# Patient Record
Sex: Male | Born: 1950
Health system: Southern US, Community
[De-identification: ages and names within clinical notes are randomized; demographics above are authoritative.]

## PROBLEM LIST (undated history)

## (undated) DIAGNOSIS — I509 Heart failure, unspecified: Secondary | ICD-10-CM

## (undated) DIAGNOSIS — M1990 Unspecified osteoarthritis, unspecified site: Secondary | ICD-10-CM

## (undated) DIAGNOSIS — I10 Essential (primary) hypertension: Secondary | ICD-10-CM

## (undated) DIAGNOSIS — E119 Type 2 diabetes mellitus without complications: Secondary | ICD-10-CM

## (undated) DIAGNOSIS — F329 Major depressive disorder, single episode, unspecified: Secondary | ICD-10-CM

## (undated) DIAGNOSIS — H409 Unspecified glaucoma: Secondary | ICD-10-CM

## (undated) DIAGNOSIS — G4730 Sleep apnea, unspecified: Secondary | ICD-10-CM

## (undated) DIAGNOSIS — F419 Anxiety disorder, unspecified: Secondary | ICD-10-CM

## (undated) DIAGNOSIS — K219 Gastro-esophageal reflux disease without esophagitis: Secondary | ICD-10-CM

## (undated) DIAGNOSIS — F32A Depression, unspecified: Secondary | ICD-10-CM

## (undated) HISTORY — PX: ROTATOR CUFF REPAIR: SHX139

## (undated) HISTORY — PX: KNEE ARTHROPLASTY: SHX992

## (undated) HISTORY — PX: VASECTOMY: SHX75

## (undated) HISTORY — PX: ROTATOR CUFF REPAIR W/ DISTAL CLAVICLE EXCISION: SHX2365

## (undated) HISTORY — PX: KNEE ARTHROSCOPY: SUR90

## (undated) HISTORY — PX: MULTIPLE TOOTH EXTRACTIONS: SHX2053

## (undated) HISTORY — PX: COLONOSCOPY: SHX174

## (undated) HISTORY — DX: Heart failure, unspecified: I50.9

## (undated) HISTORY — PX: CARPAL TUNNEL RELEASE: SHX101

## (undated) HISTORY — PX: EYE SURGERY: SHX253

---
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
## 1998-08-26 ENCOUNTER — Encounter: Payer: Self-pay | Admitting: Family Medicine

## 1998-08-26 ENCOUNTER — Ambulatory Visit (HOSPITAL_COMMUNITY): Admission: RE | Admit: 1998-08-26 | Discharge: 1998-08-26 | Payer: Self-pay | Admitting: Family Medicine

## 2006-11-19 ENCOUNTER — Ambulatory Visit (HOSPITAL_BASED_OUTPATIENT_CLINIC_OR_DEPARTMENT_OTHER): Admission: RE | Admit: 2006-11-19 | Discharge: 2006-11-19 | Payer: Self-pay | Admitting: Orthopedic Surgery

## 2007-01-06 ENCOUNTER — Ambulatory Visit (HOSPITAL_BASED_OUTPATIENT_CLINIC_OR_DEPARTMENT_OTHER): Admission: RE | Admit: 2007-01-06 | Discharge: 2007-01-06 | Payer: Self-pay | Admitting: Orthopedic Surgery

## 2010-11-14 NOTE — Op Note (Signed)
NAME:  Erik Black, Erik Black          ACCOUNT NO.:  [id number]   MEDICAL RECORD NO.:  [id number]          PATIENT TYPE:  AMB   LOCATION:  DSC                          FACILITY:  [facility]   PHYSICIAN:  Robert A. Thurston Hole, M.D. DATE OF BIRTH:  [birth year]   DATE OF PROCEDURE:  01/06/2007  DATE OF DISCHARGE:                               OPERATIVE REPORT   PREOPERATIVE DIAGNOSIS:  Left knee medial and lateral meniscal tears  with chondromalacia/degenerative joint disease and synovitis.   POSTOPERATIVE DIAGNOSIS:  Left knee medial and lateral meniscal tears  with chondromalacia/degenerative joint disease and synovitis.   PROCEDURE:  1. Left knee EUA followed by arthroscopic partial medial and lateral      meniscectomy.  2. Left knee chondroplasty with partial synovectomy.   SURGEON:  Elana Alm. Thurston Hole, M.D.   ASSISTANT:  Julien Girt, P.A.   ANESTHESIA:  General.   OPERATIVE TIME:  30 minutes.   COMPLICATIONS:  None.   INDICATIONS FOR PROCEDURE:  Erik Black is a 60 year old gentleman who  has had 6-8 months of increasing left knee pain with exam and x-rays  documenting meniscal tearing with chondromalacia and synovitis.  He has  failed conservative care and is now to undergo arthroscopy.   DESCRIPTION OF PROCEDURE:  Erik Black was brought to the operating  room on January 06, 2007, placed on the operating table in supine position.  After an adequate level of general anesthesia was obtained, his left  knee was examined.  Range of motion from 0 to 125 degrees, 1-2+  crepitation, stable ligamentous exam with normal patellar tracking.  The  knee was sterilely injected with 0.25% Marcaine with epinephrine.  He  had two small lesions on his thigh that might have been slightly pustule  although no definite infection, but because of this and these were  nowhere near the knee itself but they were treated with prophylactic  antibiotics and I gave him vancomycin 1.5 grams IV preoperatively.   His  left leg was then prepped using sterile DuraPrep and draped using  sterile technique.  Originally through an anterolateral portal, the  arthroscope with a pump attached was placed into an anteromedial portal  and arthroscopic probe was placed.  On initial inspection of the medial  compartment, he was found to 75% grade 3 chondromalacia,  medial femoral  condyle, medial tibial plateau which was debrided.  The medial meniscus  showed tearing of the posteromedial horn of which 50% was resected back  to a stable rim.  The intercondylar notch inspected, anterior and  posterior cruciate ligaments were normal.  The lateral compartment  inspected, 40% grade 3 chondromalacia in the lateral tibial plateau  which was debrided.  Only grade 1 and 2 changes of the lateral femoral  condyle. The lateral meniscus showed tearing of the posterolateral  corner, 25-30% was resected back to stable rim.  The patellofemoral  joint showed 75% grade 3 chondromalacia on the patellofemoral groove and  this was debrided.  The patella tracked normally. Significant synovitis  and medial and lateral gutters were debrided, otherwise they are free of  pathology.  After this was done, it  was felt that all pathology had been  satisfactorily addressed.  The instruments were removed. The portals  were closed with 3-0 nylon suture and injected with 0.25% Marcaine with  epinephrine and 4 mg morphine.  Sterile dressings applied and the  patient awakened and taken to the recovery room in stable condition.   FOLLOW-UP CARE:  Mr. Altier will be followed as an outpatient on  Percocet for pain.  See him back in the office in a week for sutures out  and followup.      Robert A. Thurston Hole, M.D.  Electronically Signed     RAW/MEDQ  D:  01/06/2007  T:  01/06/2007  Job:  [id number]

## 2010-11-17 NOTE — Op Note (Signed)
NAME:  Erik Black, Erik Black          ACCOUNT NO.:  [id number]   MEDICAL RECORD NO.:  [id number]          PATIENT TYPE:  AMB   LOCATION:  DSC                          FACILITY:  [facility]   PHYSICIAN:  Robert A. Thurston Hole, M.D. DATE OF BIRTH:  02-25-1951   DATE OF PROCEDURE:  11/19/2006  DATE OF DISCHARGE:                               OPERATIVE REPORT   PREOPERATIVE DIAGNOSIS:  Right knee medial and lateral meniscus tears  with chondromalacia/degenerative joint disease and synovitis.   POSTOPERATIVE DIAGNOSIS:  Right knee medial and lateral meniscus tears  with chondromalacia/degenerative joint disease and synovitis.   PROCEDURE:  1. Right knee examination under anesthesia followed by arthroscopic      partial medial and lateral meniscectomies.  2. Right knee chondroplasty with partial synovectomy.   SURGEON:  Elana Alm. Thurston Hole, M.D.   ANESTHESIA:  General.   OPERATIVE TIME:  30 minutes.   COMPLICATIONS:  None.   INDICATIONS FOR PROCEDURE:  The patient is a 60 year old gentleman who  has had significant recurrent right knee pain with exam and MRI  documenting a meniscal tear with chondromalacia and synovitis.  He has  failed conservative care and is now to undergo arthroscopy.   PROCEDURE:  The patient was brought to the operating room on 11/19/2006  after a knee block was placed in the holding area by Anesthesia.  He was  placed on the operative table in supine position.  His right knee was  examined under anesthesia.  He has full range of motion and 2+  crepitation.  The knee remained stable to ligamentous exam with normal  patella tracking.   The right leg was prepped using sterile DuraPrep and draped using  sterile technique.  He received Ancef 1 gram IV preoperatively for  prophylaxis.  Initially through an anterolateral portal the arthroscope  with a pump attached was placed, and through an anteromedial portal an  arthroscopic probe was placed.  On initial inspection in the  medial  compartment he had 80 to 90% grade 3 chondromalacia in the medial  femoral condyle and medial tibial plateau, and this was debrided.  Medial meniscus tear posterior and medial horn of which 40 to 50% was  resected back to a stable rim.  The intercondylar notch and the anterior  and posterior cruciate ligaments were intact and stable.  There was a  large tibial spine spur which was removed with a 6 mm bur.  It was  impinging on extension and was resected completely.  The lateral  compartment showed only mild Grade 1 and 2 chondromalacia.  The lateral  meniscus showed a small tear of 15% posterior and lateral corner which  was resected back to a stable rim.  The patellofemoral joint showed 80  to 90% Grade 3 chondromalacia on the patella and femoral groove, and  this was debrided.  The patella tracked normally.  Significant synovitis  in the medial and lateral gutters was debrided.  Otherwise, they were  free of pathology.  After this was done, it was felt that all pathology  had been satisfactorily addressed.  The instruments were removed.   The portals were  closed with 3-0 nylon suture.  Sterile dressings were  applied after the knee was injected with  0.25% Marcaine with  epinephrine and 4 mg morphine.  A sterile dressing was applied.  The  patient was awakened and taken to Recovery in stable condition.   FOLLOW UP CARE:  The patient will be followed either as an outpatient or  overnight recovery care depending on his postoperative awakening status  since he is a sleep apnea patient and is well controlled on a CPAP  machine.  We will make the decision over the next hour with the help of  Anesthesia.  He will be discharged on Norco 10 and Mobic.  We will see  him back in the office in 1 week for sutures out and follow up.      Robert A. Thurston Hole, M.D.  Electronically Signed     RAW/MEDQ  D:  11/19/2006  T:  11/20/2006  Job:  [id number]

## 2011-04-17 LAB — BASIC METABOLIC PANEL
BUN: 13
CO2: 31
Calcium: 9.1
Chloride: 99
Creatinine, Ser: 0.88
GFR calc Af Amer: >60
GFR calc non Af Amer: >60
Glucose, Bld: 83
Potassium: 4
Sodium: 135

## 2011-04-17 LAB — POCT HEMOGLOBIN-HEMACUE
Hemoglobin: 13
Operator id: 12362

## 2018-01-24 ENCOUNTER — Ambulatory Visit (INDEPENDENT_AMBULATORY_CARE_PROVIDER_SITE_OTHER): Payer: PRIVATE HEALTH INSURANCE

## 2018-01-24 ENCOUNTER — Ambulatory Visit (INDEPENDENT_AMBULATORY_CARE_PROVIDER_SITE_OTHER): Payer: PRIVATE HEALTH INSURANCE | Admitting: Orthopaedic Surgery

## 2018-01-24 VITALS — Ht 72.0 in | Wt 321.0 lb

## 2018-01-24 DIAGNOSIS — Z6841 Body Mass Index (BMI) 40.0 and over, adult: Secondary | ICD-10-CM

## 2018-01-24 DIAGNOSIS — M25551 Pain in right hip: Secondary | ICD-10-CM | POA: Diagnosis not present

## 2018-01-24 DIAGNOSIS — M1612 Unilateral primary osteoarthritis, left hip: Secondary | ICD-10-CM | POA: Diagnosis not present

## 2018-01-24 DIAGNOSIS — M1611 Unilateral primary osteoarthritis, right hip: Secondary | ICD-10-CM | POA: Diagnosis not present

## 2018-01-24 NOTE — Progress Notes (Signed)
Office Visit Note   Patient: Erik Black           Date of Birth: 07/05/1950           MRN: [id number] Visit Date: 01/24/2018              Requested by: Aviva Kluver, PA-C [facility] [address] [place], [place] [address] PCP: Patient, No Pcp Per   Assessment & Plan: Visit Diagnoses:  1. Primary osteoarthritis of right hip   2. Pain in right hip   3. Primary osteoarthritis of left hip   4. Morbid obesity (HCC)   5. Body mass index 40.0-44.9, adult (HCC)     Plan: Impression is advanced right hip degenerative joint disease.  Patient has borderline control his diabetes with his most recent A1c of 8.0.  He also still smokes cigarettes.  His BMI is 43.5 today.  At this point patient is not a surgical candidate given his multiple risk factors.  We will help the patient with referrals to Dr. Dalbert Garnet for weight counseling and pain management clinic.  Questions encouraged and answered.  Follow-up as needed.  They have my card if they need to get in touch with me. Total face to face encounter time was greater than 45 minutes and over half of this time was spent in counseling and/or coordination of care.  Follow-Up Instructions: Return if symptoms worsen or fail to improve.   Orders:  Orders Placed This Encounter  Procedures  . XR HIP UNILAT W OR W/O PELVIS 1V RIGHT   No orders of the defined types were placed in this encounter.     Procedures: No procedures performed   Clinical Data: No additional findings.   Subjective: Chief Complaint  Patient presents with  . Right Hip - Pain    Patient is a 67 year old gentleman who comes in with constant severe right hip pain that is severely debilitating and limiting his ADLs.  He denies any numbness and tingling.  He walks with a limp.  He comes in as a referral from the [place].  He has had a cortisone injection with 3 days of relief.  Denies any numbness and tingling.   Review of Systems  Constitutional: Negative.   All other  systems reviewed and are negative.    Objective: Vital Signs: Ht 6' (1.829 m)   Wt (!) 321 lb (145.6 kg)   BMI 43.54 kg/m   Physical Exam  Constitutional: He is oriented to person, place, and time. He appears well-developed and well-nourished.  HENT:  Head: Normocephalic and atraumatic.  Eyes: Pupils are equal, round, and reactive to light.  Neck: Neck supple.  Pulmonary/Chest: Effort normal.  Abdominal: Soft.  Musculoskeletal: Normal range of motion.  Neurological: He is alert and oriented to person, place, and time.  Skin: Skin is warm.  Psychiatric: He has a normal mood and affect. His behavior is normal. Judgment and thought content normal.  Nursing note and vitals reviewed.   Ortho Exam Right hip exam shows limited range of motion.  Positive Stinchfield sign and positive FADIR. Specialty Comments:  No specialty comments available.  Imaging: Xr Hip Unilat W Or W/o Pelvis 1v Right  Result Date: 01/24/2018 Advanced hip joint degenerative disease    PMFS History: There are no active problems to display for this patient.  No past medical history on file.  No family history on file.   Social History   Occupational History  . Not on file  Tobacco Use  .  Smoking status: Not on file  Substance and Sexual Activity  . Alcohol use: Not on file  . Drug use: Not on file  . Sexual activity: Not on file

## 2018-03-20 ENCOUNTER — Telehealth (INDEPENDENT_AMBULATORY_CARE_PROVIDER_SITE_OTHER): Payer: Self-pay | Admitting: *Deleted

## 2018-03-20 NOTE — Telephone Encounter (Signed)
Pt called stating he would like to have copy of his hip on CD and he will come to pick it up. Also, pt is advising me that he is changing doctors d/t lack of communication. Pt says that Dr.Xu will not do surgery until he loses 50lbs and that he is going to refer pt to Dr. Dalbert Garnet for weight counseling to help with weight loss. [facility] did not need to lose 50lbs before surgery. Pt was not happy with our services. Pt also states that his A1C is now at a 7 and has lost 2lbs since last seen.   I sent message to Timmothy Euler and Lequita Halt advising needing a copy of CD. They will call pt once it has been copied.

## 2018-03-21 NOTE — Telephone Encounter (Signed)
Patient called to state to disregard previous message. He does NOT need his CD. He will be coming back to the practice because the [place] advised him he has to because practice was authorized for the services. He stated he will be trying to loose his weight and wanted to apologize for his previous conversation.

## 2018-03-24 NOTE — Telephone Encounter (Signed)
noted 

## 2018-04-21 ENCOUNTER — Telehealth (INDEPENDENT_AMBULATORY_CARE_PROVIDER_SITE_OTHER): Payer: Self-pay | Admitting: Orthopaedic Surgery

## 2018-04-21 NOTE — Telephone Encounter (Signed)
Patient is requesting a call back from Dr. Roda Shutters to discuss options that he has, he was told he would need to lose 50 pounds before hip surgery but said the weight is not coming off. He would like to discuss next steps for him. CB # [phone]

## 2018-04-21 NOTE — Telephone Encounter (Signed)
We can refer him to general surgery for bariatric surgery if he agrees.

## 2018-04-21 NOTE — Telephone Encounter (Signed)
Please advise 

## 2018-04-22 NOTE — Telephone Encounter (Signed)
I spoke with patient and he agrees. Form for [facility] Request for Additional Services faxed to [phone] for [facility] review.

## 2018-05-05 ENCOUNTER — Telehealth (INDEPENDENT_AMBULATORY_CARE_PROVIDER_SITE_OTHER): Payer: Self-pay | Admitting: Surgery

## 2018-05-05 NOTE — Telephone Encounter (Signed)
Patient left a message requesting to speak to Zonia Kief nurse or assistant.  CB#[phone]

## 2018-05-05 NOTE — Telephone Encounter (Signed)
This is a [facility] patient, I called him to see what is going on he states that he was given 2 options before he could have hip replacement surgery.  He states that he would like to be referred to someone for Gastric Surgery to help him lose the weight.  He states that he wants to lose the 50lbs like last week.  He is complaining of severe pain and hes wanting to get rid of it.  And [facility] is [facility].

## 2018-05-05 NOTE — Telephone Encounter (Signed)
[facility] please.  Thanks.

## 2018-05-05 NOTE — Telephone Encounter (Signed)
Please advise where patient should be referred to. Thanks.

## 2018-05-06 ENCOUNTER — Other Ambulatory Visit (INDEPENDENT_AMBULATORY_CARE_PROVIDER_SITE_OTHER): Payer: Self-pay

## 2018-05-06 DIAGNOSIS — M25551 Pain in right hip: Secondary | ICD-10-CM

## 2018-05-06 NOTE — Telephone Encounter (Signed)
Ambulatory referral for [facility] for bariatric consult in chart.

## 2018-09-03 DIAGNOSIS — F17200 Nicotine dependence, unspecified, uncomplicated: Secondary | ICD-10-CM | POA: Diagnosis not present

## 2018-09-03 DIAGNOSIS — E7800 Pure hypercholesterolemia, unspecified: Secondary | ICD-10-CM | POA: Diagnosis not present

## 2018-09-03 DIAGNOSIS — F329 Major depressive disorder, single episode, unspecified: Secondary | ICD-10-CM | POA: Diagnosis not present

## 2018-09-03 DIAGNOSIS — Z87891 Personal history of nicotine dependence: Secondary | ICD-10-CM | POA: Diagnosis not present

## 2018-09-03 DIAGNOSIS — R0602 Shortness of breath: Secondary | ICD-10-CM | POA: Diagnosis not present

## 2018-09-03 DIAGNOSIS — R635 Abnormal weight gain: Secondary | ICD-10-CM | POA: Diagnosis not present

## 2018-09-10 DIAGNOSIS — F331 Major depressive disorder, recurrent, moderate: Secondary | ICD-10-CM | POA: Diagnosis not present

## 2018-09-15 DIAGNOSIS — G4733 Obstructive sleep apnea (adult) (pediatric): Secondary | ICD-10-CM | POA: Diagnosis not present

## 2018-09-15 DIAGNOSIS — Z9989 Dependence on other enabling machines and devices: Secondary | ICD-10-CM | POA: Diagnosis not present

## 2018-09-15 DIAGNOSIS — R0602 Shortness of breath: Secondary | ICD-10-CM | POA: Diagnosis not present

## 2018-09-15 DIAGNOSIS — F17200 Nicotine dependence, unspecified, uncomplicated: Secondary | ICD-10-CM | POA: Diagnosis not present

## 2018-09-15 DIAGNOSIS — Z77090 Contact with and (suspected) exposure to asbestos: Secondary | ICD-10-CM | POA: Diagnosis not present

## 2018-09-15 DIAGNOSIS — R942 Abnormal results of pulmonary function studies: Secondary | ICD-10-CM | POA: Diagnosis not present

## 2018-09-19 DIAGNOSIS — F329 Major depressive disorder, single episode, unspecified: Secondary | ICD-10-CM | POA: Diagnosis not present

## 2018-10-30 DIAGNOSIS — R635 Abnormal weight gain: Secondary | ICD-10-CM | POA: Diagnosis not present

## 2018-10-30 DIAGNOSIS — Z6841 Body Mass Index (BMI) 40.0 and over, adult: Secondary | ICD-10-CM | POA: Diagnosis not present

## 2018-11-28 DIAGNOSIS — F17210 Nicotine dependence, cigarettes, uncomplicated: Secondary | ICD-10-CM | POA: Diagnosis not present

## 2018-11-28 DIAGNOSIS — F5081 Binge eating disorder: Secondary | ICD-10-CM | POA: Diagnosis not present

## 2018-11-28 DIAGNOSIS — Z6841 Body Mass Index (BMI) 40.0 and over, adult: Secondary | ICD-10-CM | POA: Diagnosis not present

## 2018-11-28 DIAGNOSIS — Z87891 Personal history of nicotine dependence: Secondary | ICD-10-CM | POA: Diagnosis not present

## 2018-11-28 DIAGNOSIS — R635 Abnormal weight gain: Secondary | ICD-10-CM | POA: Diagnosis not present

## 2018-12-29 DIAGNOSIS — Z87891 Personal history of nicotine dependence: Secondary | ICD-10-CM | POA: Diagnosis not present

## 2018-12-29 DIAGNOSIS — Z6841 Body Mass Index (BMI) 40.0 and over, adult: Secondary | ICD-10-CM | POA: Diagnosis not present

## 2018-12-29 DIAGNOSIS — F17210 Nicotine dependence, cigarettes, uncomplicated: Secondary | ICD-10-CM | POA: Diagnosis not present

## 2018-12-29 DIAGNOSIS — R635 Abnormal weight gain: Secondary | ICD-10-CM | POA: Diagnosis not present

## 2019-01-28 DIAGNOSIS — R635 Abnormal weight gain: Secondary | ICD-10-CM | POA: Diagnosis not present

## 2019-01-28 DIAGNOSIS — Z87891 Personal history of nicotine dependence: Secondary | ICD-10-CM | POA: Diagnosis not present

## 2019-01-28 DIAGNOSIS — F17210 Nicotine dependence, cigarettes, uncomplicated: Secondary | ICD-10-CM | POA: Diagnosis not present

## 2019-01-28 DIAGNOSIS — E538 Deficiency of other specified B group vitamins: Secondary | ICD-10-CM | POA: Diagnosis not present

## 2019-01-28 DIAGNOSIS — Z1159 Encounter for screening for other viral diseases: Secondary | ICD-10-CM | POA: Diagnosis not present

## 2019-01-28 DIAGNOSIS — E119 Type 2 diabetes mellitus without complications: Secondary | ICD-10-CM | POA: Diagnosis not present

## 2019-02-26 DIAGNOSIS — Z6841 Body Mass Index (BMI) 40.0 and over, adult: Secondary | ICD-10-CM | POA: Diagnosis not present

## 2019-02-26 DIAGNOSIS — R635 Abnormal weight gain: Secondary | ICD-10-CM | POA: Diagnosis not present

## 2019-02-26 DIAGNOSIS — E119 Type 2 diabetes mellitus without complications: Secondary | ICD-10-CM | POA: Diagnosis not present

## 2019-02-26 DIAGNOSIS — D696 Thrombocytopenia, unspecified: Secondary | ICD-10-CM | POA: Diagnosis not present

## 2019-03-03 DIAGNOSIS — Z79899 Other long term (current) drug therapy: Secondary | ICD-10-CM | POA: Diagnosis not present

## 2019-03-03 DIAGNOSIS — M25569 Pain in unspecified knee: Secondary | ICD-10-CM | POA: Diagnosis not present

## 2019-03-03 DIAGNOSIS — G894 Chronic pain syndrome: Secondary | ICD-10-CM | POA: Diagnosis not present

## 2019-03-03 DIAGNOSIS — Z760 Encounter for issue of repeat prescription: Secondary | ICD-10-CM | POA: Diagnosis not present

## 2019-04-02 DIAGNOSIS — M159 Polyosteoarthritis, unspecified: Secondary | ICD-10-CM | POA: Diagnosis not present

## 2019-04-02 DIAGNOSIS — Z79899 Other long term (current) drug therapy: Secondary | ICD-10-CM | POA: Diagnosis not present

## 2019-04-02 DIAGNOSIS — G894 Chronic pain syndrome: Secondary | ICD-10-CM | POA: Diagnosis not present

## 2019-04-02 DIAGNOSIS — M25551 Pain in right hip: Secondary | ICD-10-CM | POA: Diagnosis not present

## 2019-04-02 DIAGNOSIS — M25552 Pain in left hip: Secondary | ICD-10-CM | POA: Diagnosis not present

## 2019-04-06 DIAGNOSIS — D696 Thrombocytopenia, unspecified: Secondary | ICD-10-CM | POA: Diagnosis not present

## 2019-04-06 DIAGNOSIS — Z1159 Encounter for screening for other viral diseases: Secondary | ICD-10-CM | POA: Diagnosis not present

## 2019-04-06 DIAGNOSIS — E119 Type 2 diabetes mellitus without complications: Secondary | ICD-10-CM | POA: Diagnosis not present

## 2019-04-06 DIAGNOSIS — R635 Abnormal weight gain: Secondary | ICD-10-CM | POA: Diagnosis not present

## 2019-04-16 ENCOUNTER — Encounter: Payer: Self-pay | Admitting: Orthopaedic Surgery

## 2019-04-16 ENCOUNTER — Ambulatory Visit (INDEPENDENT_AMBULATORY_CARE_PROVIDER_SITE_OTHER): Payer: Self-pay | Admitting: Orthopaedic Surgery

## 2019-04-16 ENCOUNTER — Other Ambulatory Visit: Payer: Self-pay

## 2019-04-16 VITALS — Ht 72.0 in | Wt 294.6 lb

## 2019-04-16 DIAGNOSIS — M1611 Unilateral primary osteoarthritis, right hip: Secondary | ICD-10-CM | POA: Insufficient documentation

## 2019-04-16 NOTE — Progress Notes (Signed)
Office Visit Note   Patient: Erik Black           Date of Birth: 1951/02/25           MRN: [id number] Visit Date: 04/16/2019              Requested by: No referring provider defined for this encounter. PCP: Patient, No Pcp Per   Assessment & Plan: Visit Diagnoses:  1. Primary osteoarthritis of right hip     Plan: Impression is end-stage right hip DJD.  We will obtain A1c level today to make sure this is within acceptable limits.  BMI is just under 40.  Details of surgery including risks benefits and rehab were discussed.  Questions encouraged and answered.  We will notify the patient of the A1c results and schedule accordingly. Total face to face encounter time was greater than 25 minutes and over half of this time was spent in counseling and/or coordination of care.  Follow-Up Instructions: Return if symptoms worsen or fail to improve.   Orders:  Orders Placed This Encounter  Procedures  . HgB A1c   No orders of the defined types were placed in this encounter.     Procedures: No procedures performed   Clinical Data: No additional findings.   Subjective: Chief Complaint  Patient presents with  . Right Hip - Pain    [name] is a 68 year old gentleman who returns today for follow-up of right hip pain secondary to his severe DJD.  We last saw him last year and recommended weight loss prior to undergoing a total hip replacement.  In the meantime he has lost 50 pounds and he states that his diabetes is under much better control.  He is hoping to be able to schedule a total hip replacement at this point.  He continues to have severe pain in his right hip that is limiting his ADLs.  He has chronic pain.   Review of Systems  Constitutional: Negative.   All other systems reviewed and are negative.    Objective: Vital Signs: Ht 6' (1.829 m)   Wt 294 lb 9.6 oz (133.6 kg)   BMI 39.95 kg/m   Physical Exam Vitals signs and nursing note reviewed.   Constitutional:      Appearance: He is well-developed.  Pulmonary:     Effort: Pulmonary effort is normal.  Abdominal:     Palpations: Abdomen is soft.  Skin:    General: Skin is warm.  Neurological:     Mental Status: He is alert and oriented to person, place, and time.  Psychiatric:        Behavior: Behavior normal.        Thought Content: Thought content normal.        Judgment: Judgment normal.     Ortho Exam Right hip exam shows significant pain with internal and external rotation.  Unable to perform Stinchfield sign secondary to pain. Specialty Comments:  No specialty comments available.  Imaging: No results found.   PMFS History: Patient Active Problem List   Diagnosis Date Noted  . Primary osteoarthritis of right hip 04/16/2019   History reviewed. No pertinent past medical history.  History reviewed. No pertinent family history.  History reviewed. No pertinent surgical history. Social History   Occupational History  . Not on file  Tobacco Use  . Smoking status: Not on file  Substance and Sexual Activity  . Alcohol use: Not on file  . Drug use: Not on file  .  Sexual activity: Not on file

## 2019-04-17 LAB — HEMOGLOBIN A1C
Hgb A1c MFr Bld: 6.1 % of total Hgb — ABNORMAL HIGH (ref <5.7)
Mean Plasma Glucose: 128 (calc)
eAG (mmol/L): 7.1 (calc)

## 2019-04-17 LAB — EXTRA SPECIMEN

## 2019-04-17 NOTE — Progress Notes (Signed)
Ok to schedule.

## 2019-04-21 ENCOUNTER — Telehealth: Payer: Self-pay

## 2019-04-21 NOTE — Telephone Encounter (Signed)
[place] would like last office note faxed to [phone], attn.medical records.  Please advise.  Thank you.

## 2019-04-22 ENCOUNTER — Telehealth: Payer: Self-pay | Admitting: Orthopaedic Surgery

## 2019-04-22 NOTE — Telephone Encounter (Signed)
Tried calling patient no answer. LMOM. A1c 6.1

## 2019-04-22 NOTE — Telephone Encounter (Signed)
Ok please call him with lab results.  Thanks.

## 2019-04-22 NOTE — Telephone Encounter (Signed)
Patient called. He would like to know his lab results. His call back number is [phone]

## 2019-04-22 NOTE — Telephone Encounter (Signed)
Please advise 

## 2019-04-23 ENCOUNTER — Telehealth: Payer: Self-pay | Admitting: Orthopaedic Surgery

## 2019-04-23 NOTE — Telephone Encounter (Signed)
Spoke with patient read note from [name] concerning his A1c results

## 2019-04-23 NOTE — Telephone Encounter (Signed)
Mrs Joycelyn Schmid advised on message below.

## 2019-05-01 DIAGNOSIS — Z79899 Other long term (current) drug therapy: Secondary | ICD-10-CM | POA: Diagnosis not present

## 2019-05-01 DIAGNOSIS — M159 Polyosteoarthritis, unspecified: Secondary | ICD-10-CM | POA: Diagnosis not present

## 2019-05-01 DIAGNOSIS — G894 Chronic pain syndrome: Secondary | ICD-10-CM | POA: Diagnosis not present

## 2019-05-05 DIAGNOSIS — E119 Type 2 diabetes mellitus without complications: Secondary | ICD-10-CM | POA: Diagnosis not present

## 2019-05-05 DIAGNOSIS — R635 Abnormal weight gain: Secondary | ICD-10-CM | POA: Diagnosis not present

## 2019-05-05 DIAGNOSIS — E1122 Type 2 diabetes mellitus with diabetic chronic kidney disease: Secondary | ICD-10-CM | POA: Diagnosis not present

## 2019-05-06 NOTE — Progress Notes (Signed)
[id number] - [place], [place] - [address] [place] [address] Phone: [phone] Fax: [phone]      Your procedure is scheduled on Monday, Nov. 9th.  Report to [facility] Main Entrance "A" at 8:20 A.M., and check in at the Admitting office.  Call this number if you have problems the morning of surgery:  [phone]  Call [phone] if you have any questions prior to your surgery date Monday-Friday 8am-4pm    Remember:  Do not eat after midnight the night before your surgery  You may drink clear liquids until 7:20 AM the morning of your surgery.   Clear liquids allowed are: Water, Non-Citrus Juices (without pulp), Carbonated Beverages, Clear Tea, Black Coffee Only, and Gatorade   Enhanced Recovery after Surgery for Orthopedics Enhanced Recovery after Surgery is a protocol used to improve the stress on your body and your recovery after surgery.  Patient Instructions  . The night before surgery:  o No food after midnight. ONLY clear liquids after midnight  . The day of surgery (if you have diabetes): o  o Drink ONE (1) Gatorade 2 (G2) as directed. o This drink was given to you during your hospital  pre-op appointment visit.  o The pre-op nurse will instruct you on the time to drink the   Gatorade 2 (G2) depending on your surgery time. o Color of the Gatorade may vary. Red is not allowed. o Nothing else to drink after completing the  Gatorade 2 (G2).         If you have questions, please contact your surgeon's office.     Take these medicines the morning of surgery with A SIP OF WATER   Amlodipine (Norvasc)  Duloxetine (Cymbalta)  Flonase nasal spray - if needed  Eye drops - if needed  Metoprolol (Lopressor)  Omeprazole (Prilosec)  Oxycodone - if needed   2 weeks prior to surgery, or as soon as possible, STOP taking Phentermine.   7 days prior to surgery STOP taking any Aspirin (unless otherwise instructed by your surgeon),  Aleve, Naproxen, Ibuprofen, Motrin, Advil, Goody's, BC's, all herbal medications, fish oil, and all vitamins.   WHAT DO I DO ABOUT MY DIABETES MEDICATION?   Marland Kitchen Do not take oral diabetes medicines (pills) the morning of surgery.   . The day of surgery, do not take other diabetes injectables, including Byetta (exenatide), Bydureon (exenatide ER), Victoza (liraglutide), or Trulicity (dulaglutide).    How to Manage Your Diabetes Before and After Surgery  Why is it important to control my blood sugar before and after surgery? . Improving blood sugar levels before and after surgery helps healing and can limit problems. . A way of improving blood sugar control is eating a healthy diet by: o  Eating less sugar and carbohydrates o  Increasing activity/exercise o  Talking with your doctor about reaching your blood sugar goals . High blood sugars (greater than 180 mg/dL) can raise your risk of infections and slow your recovery, so you will need to focus on controlling your diabetes during the weeks before surgery. . Make sure that the doctor who takes care of your diabetes knows about your planned surgery including the date and location.  How do I manage my blood sugar before surgery? . Check your blood sugar at least 4 times a day, starting 2 days before surgery, to make sure that the level is not too high or low. o Check your blood sugar the morning of  your surgery when you wake up and every 2 hours until you get to the Short Stay unit. . If your blood sugar is less than 70 mg/dL, you will need to treat for low blood sugar: o Do not take insulin. o Treat a low blood sugar (less than 70 mg/dL) with  cup of clear juice (cranberry or apple), 4 glucose tablets, OR glucose gel. o Recheck blood sugar in 15 minutes after treatment (to make sure it is greater than 70 mg/dL). If your blood sugar is not greater than 70 mg/dL on recheck, call [phone] for further instructions. . Report your blood  sugar to the short stay nurse when you get to Short Stay.  . If you are admitted to the hospital after surgery: o Your blood sugar will be checked by the staff and you will probably be given insulin after surgery (instead of oral diabetes medicines) to make sure you have good blood sugar levels. o The goal for blood sugar control after surgery is 80-180 mg/dL.    The Morning of Surgery  Do not wear jewelry.  Do not wear lotions, powders, colognes, or deodorant  Men may shave face and neck.  Do not bring valuables to the hospital.  [facility] is not responsible for any belongings or valuables.  If you are a smoker, DO NOT Smoke 24 hours prior to surgery IF you wear a CPAP at night please bring your mask, tubing, and machine the morning of surgery   Remember that you must have someone to transport you home after your surgery, and remain with you for 24 hours if you are discharged the same day.   Contacts, glasses, hearing aids, dentures or bridgework may not be worn into surgery.    Leave your suitcase in the car.  After surgery it may be brought to your room.  For patients admitted to the hospital, discharge time will be determined by your treatment team.  Patients discharged the day of surgery will not be allowed to drive home.    Special instructions:   [facility]- Preparing For Surgery  Before surgery, you can play an important role. Because skin is not sterile, your skin needs to be as free of germs as possible. You can reduce the number of germs on your skin by washing with CHG (chlorahexidine gluconate) Soap before surgery.  CHG is an antiseptic cleaner which kills germs and bonds with the skin to continue killing germs even after washing.    Oral Hygiene is also important to reduce your risk of infection.  Remember - BRUSH YOUR TEETH THE MORNING OF SURGERY WITH YOUR REGULAR TOOTHPASTE  Please do not use if you have an allergy to CHG or antibacterial soaps. If your skin  becomes reddened/irritated stop using the CHG.  Do not shave (including legs and underarms) for at least 48 hours prior to first CHG shower. It is OK to shave your face.  Please follow these instructions carefully.   1. Shower the NIGHT BEFORE SURGERY and the MORNING OF SURGERY with CHG Soap.   2. If you chose to wash your hair, wash your hair first as usual with your normal shampoo.  3. After you shampoo, rinse your hair and body thoroughly to remove the shampoo.  4. Use CHG as you would any other liquid soap. You can apply CHG directly to the skin and wash gently with a scrungie or a clean washcloth.   5. Apply the CHG Soap to your body ONLY FROM  THE NECK DOWN.  Do not use on open wounds or open sores. Avoid contact with your eyes, ears, mouth and genitals (private parts). Wash Face and genitals (private parts)  with your normal soap.   6. Wash thoroughly, paying special attention to the area where your surgery will be performed.  7. Thoroughly rinse your body with warm water from the neck down.  8. DO NOT shower/wash with your normal soap after using and rinsing off the CHG Soap.  9. Pat yourself dry with a CLEAN TOWEL.  10. Wear CLEAN PAJAMAS to bed the night before surgery, wear comfortable clothes the morning of surgery  11. Place CLEAN SHEETS on your bed the night of your first shower and DO NOT SLEEP WITH PETS.    Day of Surgery:  Do not apply any deodorants/lotions. Please shower the morning of surgery with the CHG soap  Please wear clean clothes to the hospital/surgery center.   Remember to brush your teeth WITH YOUR REGULAR TOOTHPASTE.   Please read over the following fact sheets that you were given.

## 2019-05-07 ENCOUNTER — Encounter (HOSPITAL_COMMUNITY)
Admission: RE | Admit: 2019-05-07 | Discharge: 2019-05-07 | Disposition: A | Discharge status: Home / Self Care | Payer: No Typology Code available for payment source | Source: Ambulatory Visit | Attending: Orthopaedic Surgery | Admitting: Orthopaedic Surgery

## 2019-05-07 ENCOUNTER — Other Ambulatory Visit: Payer: Self-pay

## 2019-05-07 ENCOUNTER — Ambulatory Visit (HOSPITAL_COMMUNITY)
Admission: RE | Admit: 2019-05-07 | Discharge: 2019-05-07 | Disposition: A | Discharge status: Home / Self Care | Payer: No Typology Code available for payment source | Source: Ambulatory Visit | Attending: Physician Assistant | Admitting: Physician Assistant

## 2019-05-07 ENCOUNTER — Encounter (HOSPITAL_COMMUNITY): Payer: Self-pay

## 2019-05-07 ENCOUNTER — Other Ambulatory Visit (HOSPITAL_COMMUNITY)
Admission: RE | Admit: 2019-05-07 | Discharge: 2019-05-07 | Disposition: A | Discharge status: Home / Self Care | Payer: No Typology Code available for payment source | Source: Ambulatory Visit | Attending: Orthopaedic Surgery | Admitting: Orthopaedic Surgery

## 2019-05-07 DIAGNOSIS — Z20828 Contact with and (suspected) exposure to other viral communicable diseases: Secondary | ICD-10-CM | POA: Diagnosis not present

## 2019-05-07 DIAGNOSIS — M1611 Unilateral primary osteoarthritis, right hip: Secondary | ICD-10-CM | POA: Insufficient documentation

## 2019-05-07 HISTORY — DX: Anxiety disorder, unspecified: F41.9

## 2019-05-07 HISTORY — DX: Depression, unspecified: F32.A

## 2019-05-07 HISTORY — DX: Gastro-esophageal reflux disease without esophagitis: K21.9

## 2019-05-07 HISTORY — DX: Unspecified osteoarthritis, unspecified site: M19.90

## 2019-05-07 HISTORY — DX: Sleep apnea, unspecified: G47.30

## 2019-05-07 HISTORY — DX: Essential (primary) hypertension: I10

## 2019-05-07 HISTORY — DX: Type 2 diabetes mellitus without complications: E11.9

## 2019-05-07 HISTORY — DX: Unspecified glaucoma: H40.9

## 2019-05-07 LAB — COMPREHENSIVE METABOLIC PANEL
ALT: 17 U/L (ref 0–44)
AST: 23 U/L (ref 15–41)
Albumin: 3.6 g/dL (ref 3.5–5.0)
Alkaline Phosphatase: 112 U/L (ref 38–126)
Anion gap: 9 (ref 5–15)
BUN: 18 mg/dL (ref 8–23)
CO2: 26 mmol/L (ref 22–32)
Calcium: 9.6 mg/dL (ref 8.9–10.3)
Chloride: 104 mmol/L (ref 98–111)
Creatinine, Ser: 1.02 mg/dL (ref 0.61–1.24)
GFR calc Af Amer: >60 mL/min (ref >60)
GFR calc non Af Amer: >60 mL/min (ref >60)
Glucose, Bld: 92 mg/dL (ref 70–99)
Potassium: 3.6 mmol/L (ref 3.5–5.1)
Sodium: 139 mmol/L (ref 135–145)
Total Bilirubin: 1.1 mg/dL (ref 0.3–1.2)
Total Protein: 6.7 g/dL (ref 6.5–8.1)

## 2019-05-07 LAB — PROTIME-INR
INR: 1.1 (ref 0.8–1.2)
Prothrombin Time: 13.7 seconds (ref 11.4–15.2)

## 2019-05-07 LAB — CBC WITH DIFFERENTIAL/PLATELET
Abs Immature Granulocytes: 0.01 10*3/uL (ref 0.00–0.07)
Basophils Absolute: 0 10*3/uL (ref 0.0–0.1)
Basophils Relative: 1 %
Eosinophils Absolute: 0.1 10*3/uL (ref 0.0–0.5)
Eosinophils Relative: 1 %
HCT: 40.9 % (ref 39.0–52.0)
Hemoglobin: 13.3 g/dL (ref 13.0–17.0)
Immature Granulocytes: 0 %
Lymphocytes Relative: 34 %
Lymphs Abs: 1.6 10*3/uL (ref 0.7–4.0)
MCH: 28.5 pg (ref 26.0–34.0)
MCHC: 32.5 g/dL (ref 30.0–36.0)
MCV: 87.8 fL (ref 80.0–100.0)
Monocytes Absolute: 0.5 10*3/uL (ref 0.1–1.0)
Monocytes Relative: 9 %
Neutro Abs: 2.6 10*3/uL (ref 1.7–7.7)
Neutrophils Relative %: 55 %
Platelets: 118 10*3/uL — ABNORMAL LOW (ref 150–400)
RBC: 4.66 MIL/uL (ref 4.22–5.81)
RDW: 13.9 % (ref 11.5–15.5)
WBC: 4.8 10*3/uL (ref 4.0–10.5)
nRBC: 0 % (ref 0.0–0.2)

## 2019-05-07 LAB — TYPE AND SCREEN
ABO/RH(D): A POS
Antibody Screen: NEGATIVE

## 2019-05-07 LAB — ABO/RH: ABO/RH(D): A POS

## 2019-05-07 LAB — GLUCOSE, CAPILLARY: Glucose-Capillary: 79 mg/dL (ref 70–99)

## 2019-05-07 LAB — SURGICAL PCR SCREEN
MRSA, PCR: NEGATIVE
Staphylococcus aureus: NEGATIVE

## 2019-05-07 LAB — APTT: aPTT: 32 seconds (ref 24–36)

## 2019-05-07 NOTE — Pre-Procedure Instructions (Addendum)
Erik Black  05/07/2019    Your procedure is scheduled on Monday, May 11, 2019 at 10:20 AM.   Report to [facility] Entrance "A" Admitting Office at 8:20 AM.   Call this number if you have problems the morning of surgery: [phone]   Questions prior to day of surgery, please call [phone] between 8 & 4 PM.   Remember:  Do not eat food after midnight.  You may drink clear liquids until 7:20 AM.  Clear liquids allowed are: Water, Juice (non-citric and without pulp), Carbonated beverages, Clear Tea, Black Coffee only, Plain Jell-O only, Gatorade and Plain Popsicles only   Drink the Gatorade G2 by 7:20 AM (do not sip).    Take these medicines the morning of surgery with A SIP OF WATER: Amlodipine (Norvasc),  Doxazosin (Cardura), Duloxetine (Cymbalta), Metoprolol (Lopressor), Omeprazole (Prilosec), Oxycodone (Oxycontin) - if needed, Flonase - if needed, may use eye drops.  Stop Aspirin as instructed by physician/Surgeon. Stop Phentermine, Multivitamins and Omega-3 as of today prior to surgery.  Do not use NSAIDS (Ibuprofen, Aleve, etc) or Herbal medications prior to surgery.   How to Manage Your Diabetes Before Surgery   Why is it important to control my blood sugar before and after surgery?   Improving blood sugar levels before and after surgery helps healing and can limit problems.  A way of improving blood sugar control is eating a healthy diet by:  - Eating less sugar and carbohydrates  - Increasing activity/exercise  - Talk with your doctor about reaching your blood sugar goals  High blood sugars (greater than 180 mg/dL) can raise your risk of infections and slow down your recovery so you will need to focus on controlling your diabetes during the weeks before surgery.  Make sure that the doctor who takes care of your diabetes knows about your planned surgery including the date and location.  How do I manage my blood sugars before surgery?   Check  your blood sugar at least 4 times a day, 2 days before surgery to make sure that they are not too high or low.  Check your blood sugar the morning of your surgery when you wake up and every 2 hours until you get to the Short-Stay unit.  Treat a low blood sugar (less than 70 mg/dL) with 1/2 cup of clear juice (cranberry or apple), 4 glucose tablets, OR glucose gel.  Recheck blood sugar in 15 minutes after treatment (to make sure it is greater than 70 mg/dL).  If blood sugar is not greater than 70 mg/dL on re-check, call [phone] for further instructions.   Report your blood sugar to the Short-Stay nurse when you get to Short-Stay.  References:  [facility], 2007 "How to Manage your Diabetes Before and After Surgery".    Do not wear jewelry.  Do not wear lotions, powders, cologne or deodorant.  Men may shave face and neck.  Do not bring valuables to the hospital.  [facility] is not responsible for any belongings or valuables.  Contacts, dentures or bridgework may not be worn into surgery.  Leave your suitcase in the car.  After surgery it may be brought to your room.  For patients admitted to the hospital, discharge time will be determined by your treatment team.  [facility] - Preparing for Surgery  Before surgery, you can play an important role.  Because skin is not sterile, your skin needs to be as free of germs as possible.  You can reduce the number of germs on you skin by washing with CHG (chlorahexidine gluconate) soap before surgery.  CHG is an antiseptic cleaner which kills germs and bonds with the skin to continue killing germs even after washing.  Oral Hygiene is also important in reducing the risk of infection.  Remember to brush your teeth with your regular toothpaste the morning of surgery.  Please DO NOT use if you have an allergy to CHG or antibacterial soaps.  If your skin becomes reddened/irritated stop using the CHG and inform your nurse  when you arrive at Short Stay.  Do not shave (including legs and underarms) for at least 48 hours prior to the first CHG shower.  You may shave your face.  Please follow these instructions carefully:   1.  Shower with CHG Soap the night before surgery and the morning of Surgery.  2.  If you choose to wash your hair, wash your hair first as usual with your normal shampoo.  3.  After you shampoo, rinse your hair and body thoroughly to remove the shampoo. 4.  Use CHG as you would any other liquid soap.  You can apply chg directly to the skin and wash gently with a      scrungie or washcloth.           5.  Apply the CHG Soap to your body ONLY FROM THE NECK DOWN.   Do not use on open wounds or open sores. Avoid contact with your eyes, ears, mouth and genitals (private parts).  Wash genitals (private parts) with your normal soap - do this prior to using CHG soap.  6.  Wash thoroughly, paying special attention to the area where your surgery will be performed.  7.  Thoroughly rinse your body with warm water from the neck down.  8.  DO NOT shower/wash with your normal soap after using and rinsing off the CHG Soap.  9.  Pat yourself dry with a clean towel.            10.  Wear clean pajamas.            11.  Place clean sheets on your bed the night of your first shower and do not sleep with pets.  Day of Surgery  Shower as above. Do not apply any lotions/deodorants the morning of surgery.   Please wear clean clothes to the hospital. Remember to brush your teeth with toothpaste.  Please read over the fact sheets that you were given.

## 2019-05-07 NOTE — Progress Notes (Addendum)
[facility] [place] - N[facility]A  PPM/ICD - N/A Device Orders -  Rep Notified -   Chest x-ray - today EKG - today Stress Test -  ECHO - requested Cardiac Cath - denies  Sleep Study -  CPAP - wears nightly, will bring dos  Fasting Blood Sugar - around 80 Checks Blood Sugar __several___ times a day A1C - 6.1 on 04/15/09  Blood Thinner Instructions: N/A Aspirin Instructions: None, instructed wife to call surgeon and ask  ERAS Protcol - Yes PRE-SURGERY Ensure or G2- gave pt G2  COVID TEST- done today 05/07/19  Pt requested that wife be in with him for the PAT appt. He didn't feel that he could answer all the questions. He was able to do so. Pt and wife understand that wife will not be able to be in pre-op with him day of surgery. They voiced understanding of the [facility] policy.  Pt instructed to stop Phentermine as of today prior to surgery.   Anesthesia review: requesting records for the [place]  Patient denies shortness of breath, fever, cough and chest pain at PAT appointment   All instructions explained to the patient, with a verbal understanding of the material. Patient agrees to go over the instructions while at home for a better understanding. Patient also instructed to self quarantine after being tested for COVID-19. The opportunity to ask questions was provided.

## 2019-05-08 ENCOUNTER — Telehealth: Payer: Self-pay | Admitting: Orthopaedic Surgery

## 2019-05-08 LAB — NOVEL CORONAVIRUS, NAA (HOSP ORDER, SEND-OUT TO REF LAB; TAT 18-24 HRS): SARS-CoV-2, NAA: NOT DETECTED

## 2019-05-08 MED ORDER — TRANEXAMIC ACID-NACL 1000-0.7 MG/100ML-% IV SOLN
1000.0000 mg | INTRAVENOUS | Status: AC
  Administered 2019-05-11: 1000 mg via INTRAVENOUS
  Filled 2019-05-08: qty 100

## 2019-05-08 MED ORDER — DEXTROSE 5 % IV SOLN
3.0000 g | INTRAVENOUS | Status: AC
  Administered 2019-05-11: 3 g via INTRAVENOUS
  Filled 2019-05-08: qty 3

## 2019-05-08 MED ORDER — TRANEXAMIC ACID 1000 MG/10ML IV SOLN
2000.0000 mg | INTRAVENOUS | Status: AC
  Administered 2019-05-11: 2000 mg via TOPICAL
  Filled 2019-05-08: qty 20

## 2019-05-08 NOTE — Progress Notes (Signed)
Anesthesia Chart Review: Pt had cardiology eval at the [facility] Jan 2019 for coronary calcifications seen on CT. He had a nuclear stress that was nonischemic.  Echo showed EF 50 to 55%, no significant valve abnormalities.  Preop labs reviewed, WNL.  EKG 05/07/19: Sinus rhythm with occasional Premature ventricular complexes. Rate 70.  Echo 08/02/2017 (copy on patient chart): 1.  The left ventricle is normal in size. 2.  There is mild to moderate concentric left ventricular hypertrophy. 3.  Left ventricular systolic function is normal. 4.  Ejection fraction is 50 to 55%. 5.  There is mild aortic sclerosis. 6.  There is mild mitral valve thickening. 7.  There is trace mitral regurgitation. 8.  The tricuspid valve is normal in structure and function. 9.  There is no pericardial effusion.  Nuclear stress 07/31/2017 (see copy in patient chart): Impression: 1.  No reversible ischemia.  Inferior wall attenuation artifact due to bowel uptake on rest images only. 2.  Normal wall motion.  EF is 47% but gating was poor.  Dilated LV.  Recommend correlating with echocardiogram.   Wynonia Musty [facility] Short Stay Center/Anesthesiology Phone [phone] 05/08/2019 11:46 AM

## 2019-05-08 NOTE — Telephone Encounter (Signed)
Jake from the [facility] left a voicemail message requesting the office notes from the October 15 visit. He also stated that he needs the POC and if he will be having surgery.  CB#[phone].  Thank you.

## 2019-05-08 NOTE — Telephone Encounter (Signed)
Received voicemail message from [place] with [facility]    Needing the 04/16/2019 note faxed to him. The fax# is [phone]   The ph# is [phone]

## 2019-05-08 NOTE — Anesthesia Preprocedure Evaluation (Addendum)
Anesthesia Evaluation  Patient identified by MRN, date of birth, ID band Patient awake    Reviewed: Allergy & Precautions, NPO status , Patient's Chart, lab work & pertinent test results, reviewed documented beta blocker date and time   History of Anesthesia Complications Negative for: history of anesthetic complications  Airway Mallampati: II  TM Distance: >3 FB Neck ROM: Full    Dental  (+) Dental Advisory Given, Missing   Pulmonary sleep apnea and Continuous Positive Airway Pressure Ventilation , Current Smoker and Patient abstained from smoking.,    Pulmonary exam normal        Cardiovascular hypertension, Pt. on medications and Pt. on home beta blockers (-) anginaNormal cardiovascular exam   '19 TTE - Mild to moderate concentric LVH. EF 50 to 55%. Mild aortic sclerosis. Trace MR.  '19 Nuclear stress - No reversible ischemia.  Inferior wall attenuation artifact due to bowel uptake on rest images only.  Normal wall motion.  EF is 47% but gating was poor. Dilated LV.  Recommend correlating with echocardiogram.    Neuro/Psych PSYCHIATRIC DISORDERS Anxiety Depression negative neurological ROS     GI/Hepatic Neg liver ROS, GERD  Medicated and Controlled,  Endo/Other  diabetes, Type 2Morbid obesity  Renal/GU negative Renal ROS     Musculoskeletal  (+) Arthritis ,   Abdominal (+) + obese,   Peds  Hematology  Thrombocytopenia (Plt 118k)    Anesthesia Other Findings Glaucoma On buprenorphine  Reproductive/Obstetrics                           Anesthesia Physical Anesthesia Plan  ASA: III  Anesthesia Plan: Spinal   Post-op Pain Management:    Induction:   PONV Risk Score and Plan: 1 and Treatment may vary due to age or medical condition, Propofol infusion and Ondansetron  Airway Management Planned: Natural Airway and Simple Face Mask  Additional Equipment: None  Intra-op Plan:    Post-operative Plan:   Informed Consent: I have reviewed the patients History and Physical, chart, labs and discussed the procedure including the risks, benefits and alternatives for the proposed anesthesia with the patient or authorized representative who has indicated his/her understanding and acceptance.       Plan Discussed with: CRNA and Anesthesiologist  Anesthesia Plan Comments: (Labs reviewed, platelets acceptable. Discussed risks and benefits of spinal, including spinal/epidural hematoma, infection, failed block, and PDPH. Patient expressed understanding and wished to proceed. )     Anesthesia Quick Evaluation

## 2019-05-11 ENCOUNTER — Encounter (HOSPITAL_COMMUNITY)
Admission: RE | Disposition: A | Discharge status: Home Health | Payer: Self-pay | Source: Home / Self Care | Attending: Orthopaedic Surgery

## 2019-05-11 ENCOUNTER — Inpatient Hospital Stay (HOSPITAL_COMMUNITY): Payer: No Typology Code available for payment source | Admitting: Physician Assistant

## 2019-05-11 ENCOUNTER — Other Ambulatory Visit: Payer: Self-pay

## 2019-05-11 ENCOUNTER — Encounter (HOSPITAL_COMMUNITY): Payer: Self-pay

## 2019-05-11 ENCOUNTER — Inpatient Hospital Stay (HOSPITAL_COMMUNITY)
Admission: RE | Admit: 2019-05-11 | Discharge: 2019-05-19 | DRG: 470 | Disposition: A | Discharge status: Home Health | Payer: No Typology Code available for payment source | Attending: Orthopaedic Surgery | Admitting: Orthopaedic Surgery

## 2019-05-11 ENCOUNTER — Inpatient Hospital Stay (HOSPITAL_COMMUNITY): Payer: No Typology Code available for payment source

## 2019-05-11 ENCOUNTER — Inpatient Hospital Stay (HOSPITAL_COMMUNITY): Payer: No Typology Code available for payment source | Admitting: Anesthesiology

## 2019-05-11 DIAGNOSIS — Z6839 Body mass index (BMI) 39.0-39.9, adult: Secondary | ICD-10-CM | POA: Diagnosis not present

## 2019-05-11 DIAGNOSIS — F17210 Nicotine dependence, cigarettes, uncomplicated: Secondary | ICD-10-CM | POA: Diagnosis present

## 2019-05-11 DIAGNOSIS — E119 Type 2 diabetes mellitus without complications: Secondary | ICD-10-CM | POA: Diagnosis present

## 2019-05-11 DIAGNOSIS — D62 Acute posthemorrhagic anemia: Secondary | ICD-10-CM | POA: Diagnosis not present

## 2019-05-11 DIAGNOSIS — Z7982 Long term (current) use of aspirin: Secondary | ICD-10-CM | POA: Diagnosis not present

## 2019-05-11 DIAGNOSIS — Z96653 Presence of artificial knee joint, bilateral: Secondary | ICD-10-CM | POA: Diagnosis present

## 2019-05-11 DIAGNOSIS — Z96641 Presence of right artificial hip joint: Secondary | ICD-10-CM | POA: Diagnosis not present

## 2019-05-11 DIAGNOSIS — M1611 Unilateral primary osteoarthritis, right hip: Secondary | ICD-10-CM | POA: Diagnosis present

## 2019-05-11 DIAGNOSIS — F419 Anxiety disorder, unspecified: Secondary | ICD-10-CM | POA: Diagnosis present

## 2019-05-11 DIAGNOSIS — I10 Essential (primary) hypertension: Secondary | ICD-10-CM | POA: Diagnosis present

## 2019-05-11 DIAGNOSIS — Z20828 Contact with and (suspected) exposure to other viral communicable diseases: Secondary | ICD-10-CM | POA: Diagnosis present

## 2019-05-11 DIAGNOSIS — H409 Unspecified glaucoma: Secondary | ICD-10-CM | POA: Diagnosis present

## 2019-05-11 DIAGNOSIS — E876 Hypokalemia: Secondary | ICD-10-CM | POA: Diagnosis present

## 2019-05-11 DIAGNOSIS — K219 Gastro-esophageal reflux disease without esophagitis: Secondary | ICD-10-CM | POA: Diagnosis present

## 2019-05-11 DIAGNOSIS — Z96649 Presence of unspecified artificial hip joint: Secondary | ICD-10-CM

## 2019-05-11 DIAGNOSIS — Z79899 Other long term (current) drug therapy: Secondary | ICD-10-CM | POA: Diagnosis not present

## 2019-05-11 DIAGNOSIS — G4730 Sleep apnea, unspecified: Secondary | ICD-10-CM | POA: Diagnosis present

## 2019-05-11 DIAGNOSIS — Z419 Encounter for procedure for purposes other than remedying health state, unspecified: Secondary | ICD-10-CM

## 2019-05-11 HISTORY — PX: TOTAL HIP ARTHROPLASTY: SHX124

## 2019-05-11 LAB — GLUCOSE, CAPILLARY
Glucose-Capillary: 92 mg/dL (ref 70–99)
Glucose-Capillary: 72 mg/dL (ref 70–99)
Glucose-Capillary: 87 mg/dL (ref 70–99)
Glucose-Capillary: 108 mg/dL — ABNORMAL HIGH (ref 70–99)

## 2019-05-11 SURGERY — ARTHROPLASTY, HIP, TOTAL, ANTERIOR APPROACH
Anesthesia: Spinal | Site: Hip | Laterality: Right

## 2019-05-11 MED ORDER — ONDANSETRON HCL 4 MG PO TABS: 4.0000 mg | ORAL_TABLET | Freq: Three times a day (TID) | ORAL | 0 refills | Status: DC | PRN

## 2019-05-11 MED ORDER — DIPHENHYDRAMINE HCL 12.5 MG/5ML PO ELIX
25.0000 mg | ORAL_SOLUTION | ORAL | Status: DC | PRN
  Administered 2019-05-18: 25 mg via ORAL
  Filled 2019-05-11: qty 10

## 2019-05-11 MED ORDER — PHENYLEPHRINE HCL-NACL 10-0.9 MG/250ML-% IV SOLN
INTRAVENOUS | Status: DC | PRN
  Administered 2019-05-11: 25 ug/min via INTRAVENOUS

## 2019-05-11 MED ORDER — DEXAMETHASONE SODIUM PHOSPHATE 10 MG/ML IJ SOLN
10.0000 mg | Freq: Once | INTRAMUSCULAR | Status: AC
  Administered 2019-05-12: 10 mg via INTRAVENOUS
  Filled 2019-05-11: qty 1

## 2019-05-11 MED ORDER — FLUTICASONE PROPIONATE 50 MCG/ACT NA SUSP
1.0000 | NASAL | Status: DC | PRN
  Filled 2019-05-11: qty 16

## 2019-05-11 MED ORDER — PROPOFOL 10 MG/ML IV BOLUS
INTRAVENOUS | Status: AC
  Filled 2019-05-11: qty 20

## 2019-05-11 MED ORDER — FENTANYL CITRATE (PF) 100 MCG/2ML IJ SOLN: 25.0000 ug | INTRAMUSCULAR | Status: DC | PRN

## 2019-05-11 MED ORDER — ALBUMIN HUMAN 5 % IV SOLN
INTRAVENOUS | Status: DC | PRN
  Administered 2019-05-11: 11:00:00 via INTRAVENOUS

## 2019-05-11 MED ORDER — CHLORHEXIDINE GLUCONATE 4 % EX LIQD: 60.0000 mL | Freq: Once | CUTANEOUS | Status: DC

## 2019-05-11 MED ORDER — VANCOMYCIN HCL 1 G IV SOLR
INTRAVENOUS | Status: DC | PRN
  Administered 2019-05-11: 1000 mg via TOPICAL

## 2019-05-11 MED ORDER — PHENYLEPHRINE HCL (PRESSORS) 10 MG/ML IV SOLN
INTRAVENOUS | Status: DC | PRN
  Administered 2019-05-11: 40 ug via INTRAVENOUS
  Administered 2019-05-11: 60 ug via INTRAVENOUS
  Administered 2019-05-11: 40 ug via INTRAVENOUS

## 2019-05-11 MED ORDER — GABAPENTIN 300 MG PO CAPS
300.0000 mg | ORAL_CAPSULE | Freq: Three times a day (TID) | ORAL | Status: DC
  Administered 2019-05-11 – 2019-05-19 (×24): 300 mg via ORAL
  Filled 2019-05-11 (×24): qty 1

## 2019-05-11 MED ORDER — METHOCARBAMOL 750 MG PO TABS: 750.0000 mg | ORAL_TABLET | Freq: Two times a day (BID) | ORAL | 0 refills | Status: DC | PRN

## 2019-05-11 MED ORDER — POTASSIUM CHLORIDE CRYS ER 20 MEQ PO TBCR
60.0000 meq | EXTENDED_RELEASE_TABLET | Freq: Every day | ORAL | Status: DC
  Administered 2019-05-12 – 2019-05-19 (×8): 60 meq via ORAL
  Filled 2019-05-11 (×8): qty 3

## 2019-05-11 MED ORDER — METOCLOPRAMIDE HCL 5 MG/ML IJ SOLN: 5.0000 mg | Freq: Three times a day (TID) | INTRAMUSCULAR | Status: DC | PRN

## 2019-05-11 MED ORDER — ASPIRIN 81 MG PO CHEW
81.0000 mg | CHEWABLE_TABLET | Freq: Two times a day (BID) | ORAL | Status: DC
  Administered 2019-05-11 – 2019-05-19 (×16): 81 mg via ORAL
  Filled 2019-05-11 (×16): qty 1

## 2019-05-11 MED ORDER — INSULIN ASPART 100 UNIT/ML ~~LOC~~ SOLN
0.0000 [IU] | Freq: Three times a day (TID) | SUBCUTANEOUS | Status: DC
  Administered 2019-05-12 (×2): 3 [IU] via SUBCUTANEOUS

## 2019-05-11 MED ORDER — BUPRENORPHINE HCL 450 MCG BU FILM
450.0000 ug | ORAL_FILM | Freq: Two times a day (BID) | BUCCAL | Status: DC
  Administered 2019-05-11 – 2019-05-19 (×16): 450 ug via ORAL
  Filled 2019-05-11: qty 1

## 2019-05-11 MED ORDER — AMLODIPINE BESYLATE 10 MG PO TABS
10.0000 mg | ORAL_TABLET | Freq: Every day | ORAL | Status: DC
  Administered 2019-05-12 – 2019-05-19 (×8): 10 mg via ORAL
  Filled 2019-05-11 (×8): qty 1

## 2019-05-11 MED ORDER — MIDAZOLAM HCL 5 MG/5ML IJ SOLN
INTRAMUSCULAR | Status: DC | PRN
  Administered 2019-05-11: 1 mg via INTRAVENOUS
  Administered 2019-05-11: 0.5 mg via INTRAVENOUS

## 2019-05-11 MED ORDER — LOSARTAN POTASSIUM 50 MG PO TABS
100.0000 mg | ORAL_TABLET | Freq: Every day | ORAL | Status: DC
  Administered 2019-05-12 – 2019-05-19 (×8): 100 mg via ORAL
  Filled 2019-05-11 (×8): qty 2

## 2019-05-11 MED ORDER — SODIUM CHLORIDE 0.9 % IR SOLN
Status: DC | PRN
  Administered 2019-05-11: 3000 mL

## 2019-05-11 MED ORDER — ACETAMINOPHEN 500 MG PO TABS
1000.0000 mg | ORAL_TABLET | Freq: Four times a day (QID) | ORAL | Status: AC
  Administered 2019-05-11 – 2019-05-12 (×3): 1000 mg via ORAL
  Filled 2019-05-11 (×4): qty 2

## 2019-05-11 MED ORDER — PROPOFOL 1000 MG/100ML IV EMUL
INTRAVENOUS | Status: AC
  Filled 2019-05-11: qty 100

## 2019-05-11 MED ORDER — POVIDONE-IODINE 10 % EX SWAB: 2.0000 "application " | Freq: Once | CUTANEOUS | Status: DC

## 2019-05-11 MED ORDER — CEFAZOLIN SODIUM-DEXTROSE 2-4 GM/100ML-% IV SOLN
2.0000 g | Freq: Four times a day (QID) | INTRAVENOUS | Status: AC
  Administered 2019-05-11 – 2019-05-12 (×3): 2 g via INTRAVENOUS
  Filled 2019-05-11 (×2): qty 100

## 2019-05-11 MED ORDER — OXYCODONE HCL 5 MG PO TABS
10.0000 mg | ORAL_TABLET | ORAL | Status: DC | PRN
  Administered 2019-05-11: 10 mg via ORAL
  Filled 2019-05-11: qty 2

## 2019-05-11 MED ORDER — SULFAMETHOXAZOLE-TRIMETHOPRIM 800-160 MG PO TABS: 1.0000 | ORAL_TABLET | Freq: Two times a day (BID) | ORAL | 0 refills | Status: DC

## 2019-05-11 MED ORDER — METHOCARBAMOL 1000 MG/10ML IJ SOLN
500.0000 mg | Freq: Four times a day (QID) | INTRAVENOUS | Status: DC | PRN
  Filled 2019-05-11: qty 5

## 2019-05-11 MED ORDER — FENTANYL CITRATE (PF) 250 MCG/5ML IJ SOLN
INTRAMUSCULAR | Status: DC | PRN
  Administered 2019-05-11: 25 ug via INTRAVENOUS
  Administered 2019-05-11: 50 ug via INTRAVENOUS

## 2019-05-11 MED ORDER — METOPROLOL TARTRATE 50 MG PO TABS
50.0000 mg | ORAL_TABLET | Freq: Two times a day (BID) | ORAL | Status: DC
  Administered 2019-05-11 – 2019-05-19 (×16): 50 mg via ORAL
  Filled 2019-05-11 (×16): qty 1

## 2019-05-11 MED ORDER — CHLORHEXIDINE GLUCONATE CLOTH 2 % EX PADS
6.0000 | MEDICATED_PAD | Freq: Every day | CUTANEOUS | Status: DC
  Administered 2019-05-12: 6 via TOPICAL

## 2019-05-11 MED ORDER — TRANEXAMIC ACID-NACL 1000-0.7 MG/100ML-% IV SOLN
1000.0000 mg | Freq: Once | INTRAVENOUS | Status: AC
  Administered 2019-05-11: 1000 mg via INTRAVENOUS
  Filled 2019-05-11: qty 100

## 2019-05-11 MED ORDER — EPHEDRINE SULFATE 50 MG/ML IJ SOLN
INTRAMUSCULAR | Status: DC | PRN
  Administered 2019-05-11 (×3): 5 mg via INTRAVENOUS

## 2019-05-11 MED ORDER — OXYCODONE HCL ER 10 MG PO T12A: 10.0000 mg | EXTENDED_RELEASE_TABLET | Freq: Three times a day (TID) | ORAL | Status: DC | PRN

## 2019-05-11 MED ORDER — ONDANSETRON HCL 4 MG PO TABS: 4.0000 mg | ORAL_TABLET | Freq: Four times a day (QID) | ORAL | Status: DC | PRN

## 2019-05-11 MED ORDER — MENTHOL 3 MG MT LOZG: 1.0000 | LOZENGE | OROMUCOSAL | Status: DC | PRN

## 2019-05-11 MED ORDER — DULOXETINE HCL 60 MG PO CPEP
60.0000 mg | ORAL_CAPSULE | Freq: Every day | ORAL | Status: DC
  Administered 2019-05-12 – 2019-05-19 (×8): 60 mg via ORAL
  Filled 2019-05-11 (×2): qty 1
  Filled 2019-05-11: qty 2
  Filled 2019-05-11 (×5): qty 1

## 2019-05-11 MED ORDER — OXYCODONE HCL 5 MG PO TABS
5.0000 mg | ORAL_TABLET | ORAL | Status: DC | PRN
  Administered 2019-05-12 – 2019-05-13 (×2): 10 mg via ORAL
  Filled 2019-05-11 (×2): qty 2

## 2019-05-11 MED ORDER — POTASSIUM CHLORIDE CRYS ER 20 MEQ PO TBCR
40.0000 meq | EXTENDED_RELEASE_TABLET | Freq: Every evening | ORAL | Status: DC
  Administered 2019-05-11 – 2019-05-18 (×8): 40 meq via ORAL
  Filled 2019-05-11 (×8): qty 2

## 2019-05-11 MED ORDER — ALUM & MAG HYDROXIDE-SIMETH 200-200-20 MG/5ML PO SUSP: 30.0000 mL | ORAL | Status: DC | PRN

## 2019-05-11 MED ORDER — CEFAZOLIN SODIUM-DEXTROSE 2-4 GM/100ML-% IV SOLN
INTRAVENOUS | Status: AC
  Filled 2019-05-11: qty 100

## 2019-05-11 MED ORDER — LIDOCAINE 2% (20 MG/ML) 5 ML SYRINGE
INTRAMUSCULAR | Status: DC | PRN
  Administered 2019-05-11: 40 mg via INTRAVENOUS

## 2019-05-11 MED ORDER — MAGNESIUM CITRATE PO SOLN
1.0000 | Freq: Once | ORAL | Status: AC | PRN
  Administered 2019-05-19: 1 via ORAL
  Filled 2019-05-11: qty 296

## 2019-05-11 MED ORDER — ACETAMINOPHEN 325 MG PO TABS: 325.0000 mg | ORAL_TABLET | Freq: Four times a day (QID) | ORAL | Status: DC | PRN

## 2019-05-11 MED ORDER — DOXAZOSIN MESYLATE 8 MG PO TABS
8.0000 mg | ORAL_TABLET | Freq: Every day | ORAL | Status: DC
  Administered 2019-05-12 – 2019-05-19 (×8): 8 mg via ORAL
  Filled 2019-05-11 (×9): qty 1

## 2019-05-11 MED ORDER — MIDAZOLAM HCL 2 MG/2ML IJ SOLN
INTRAMUSCULAR | Status: AC
  Filled 2019-05-11: qty 2

## 2019-05-11 MED ORDER — ONDANSETRON HCL 4 MG/2ML IJ SOLN: 4.0000 mg | Freq: Four times a day (QID) | INTRAMUSCULAR | Status: DC | PRN

## 2019-05-11 MED ORDER — METOCLOPRAMIDE HCL 5 MG PO TABS: 5.0000 mg | ORAL_TABLET | Freq: Three times a day (TID) | ORAL | Status: DC | PRN

## 2019-05-11 MED ORDER — LACTATED RINGERS IV SOLN
INTRAVENOUS | Status: DC
  Administered 2019-05-11 (×2): via INTRAVENOUS

## 2019-05-11 MED ORDER — OXYCODONE HCL 5 MG PO TABS
5.0000 mg | ORAL_TABLET | Freq: Once | ORAL | Status: AC | PRN
  Administered 2019-05-11: 5 mg via ORAL

## 2019-05-11 MED ORDER — PROPOFOL 500 MG/50ML IV EMUL
INTRAVENOUS | Status: DC | PRN
  Administered 2019-05-11: 25 ug/kg/min via INTRAVENOUS

## 2019-05-11 MED ORDER — OXYCODONE HCL ER 10 MG PO T12A
10.0000 mg | EXTENDED_RELEASE_TABLET | Freq: Two times a day (BID) | ORAL | Status: DC
  Administered 2019-05-11 – 2019-05-19 (×16): 10 mg via ORAL
  Filled 2019-05-11 (×16): qty 1

## 2019-05-11 MED ORDER — 0.9 % SODIUM CHLORIDE (POUR BTL) OPTIME
TOPICAL | Status: DC | PRN
  Administered 2019-05-11: 10:00:00 1000 mL

## 2019-05-11 MED ORDER — ONDANSETRON HCL 4 MG/2ML IJ SOLN: 4.0000 mg | Freq: Once | INTRAMUSCULAR | Status: DC | PRN

## 2019-05-11 MED ORDER — KETOROLAC TROMETHAMINE 15 MG/ML IJ SOLN
INTRAMUSCULAR | Status: AC
  Filled 2019-05-11: qty 1

## 2019-05-11 MED ORDER — HYDROMORPHONE HCL 1 MG/ML IJ SOLN
INTRAMUSCULAR | Status: AC
  Filled 2019-05-11: qty 1

## 2019-05-11 MED ORDER — SORBITOL 70 % SOLN: 30.0000 mL | Freq: Every day | Status: DC | PRN

## 2019-05-11 MED ORDER — VANCOMYCIN HCL 1000 MG IV SOLR
INTRAVENOUS | Status: AC
  Filled 2019-05-11: qty 1000

## 2019-05-11 MED ORDER — DOCUSATE SODIUM 100 MG PO CAPS
100.0000 mg | ORAL_CAPSULE | Freq: Two times a day (BID) | ORAL | Status: DC
  Administered 2019-05-11 – 2019-05-19 (×15): 100 mg via ORAL
  Filled 2019-05-11 (×16): qty 1

## 2019-05-11 MED ORDER — DULAGLUTIDE 0.75 MG/0.5ML ~~LOC~~ SOAJ
0.7500 mg | SUBCUTANEOUS | Status: DC
  Administered 2019-05-13: 0.75 mg via SUBCUTANEOUS

## 2019-05-11 MED ORDER — LIDOCAINE 2% (20 MG/ML) 5 ML SYRINGE
INTRAMUSCULAR | Status: AC
  Filled 2019-05-11: qty 10

## 2019-05-11 MED ORDER — INSULIN ASPART 100 UNIT/ML ~~LOC~~ SOLN: 0.0000 [IU] | Freq: Every day | SUBCUTANEOUS | Status: DC

## 2019-05-11 MED ORDER — SODIUM CHLORIDE 0.9 % IV SOLN
INTRAVENOUS | Status: DC
  Administered 2019-05-11 – 2019-05-12 (×2): via INTRAVENOUS

## 2019-05-11 MED ORDER — ASPIRIN EC 81 MG PO TBEC: 81.0000 mg | DELAYED_RELEASE_TABLET | Freq: Two times a day (BID) | ORAL | 0 refills | Status: DC

## 2019-05-11 MED ORDER — OXYCODONE-ACETAMINOPHEN 5-325 MG PO TABS: 1.0000 | ORAL_TABLET | Freq: Three times a day (TID) | ORAL | 0 refills | Status: DC | PRN

## 2019-05-11 MED ORDER — HYDROMORPHONE HCL 1 MG/ML IJ SOLN
0.5000 mg | INTRAMUSCULAR | Status: DC | PRN
  Administered 2019-05-11: 1 mg via INTRAVENOUS

## 2019-05-11 MED ORDER — POLYETHYLENE GLYCOL 3350 17 G PO PACK
17.0000 g | PACK | Freq: Every day | ORAL | Status: DC | PRN
  Administered 2019-05-18: 17 g via ORAL
  Filled 2019-05-11: qty 1

## 2019-05-11 MED ORDER — IRRISEPT - 450ML BOTTLE WITH 0.05% CHG IN STERILE WATER, USP 99.95% OPTIME
TOPICAL | Status: DC | PRN
  Administered 2019-05-11: 10:00:00 450 mL

## 2019-05-11 MED ORDER — OXYCODONE HCL 5 MG PO TABS
ORAL_TABLET | ORAL | Status: AC
  Filled 2019-05-11: qty 1

## 2019-05-11 MED ORDER — PHENOL 1.4 % MT LIQD: 1.0000 | OROMUCOSAL | Status: DC | PRN

## 2019-05-11 MED ORDER — FENTANYL CITRATE (PF) 250 MCG/5ML IJ SOLN
INTRAMUSCULAR | Status: AC
  Filled 2019-05-11: qty 5

## 2019-05-11 MED ORDER — TIMOLOL MALEATE 0.5 % OP SOLG
1.0000 [drp] | Freq: Every day | OPHTHALMIC | Status: DC
  Administered 2019-05-12 – 2019-05-18 (×7): 1 [drp] via OPHTHALMIC
  Filled 2019-05-11: qty 5

## 2019-05-11 MED ORDER — LATANOPROST 0.005 % OP SOLN
1.0000 [drp] | Freq: Every day | OPHTHALMIC | Status: DC
  Administered 2019-05-12 – 2019-05-18 (×7): 1 [drp] via OPHTHALMIC
  Filled 2019-05-11: qty 2.5

## 2019-05-11 MED ORDER — METHOCARBAMOL 500 MG PO TABS
750.0000 mg | ORAL_TABLET | Freq: Four times a day (QID) | ORAL | Status: DC | PRN
  Administered 2019-05-11 – 2019-05-13 (×2): 750 mg via ORAL
  Filled 2019-05-11 (×2): qty 2

## 2019-05-11 MED ORDER — PHENYLEPHRINE 40 MCG/ML (10ML) SYRINGE FOR IV PUSH (FOR BLOOD PRESSURE SUPPORT)
PREFILLED_SYRINGE | INTRAVENOUS | Status: AC
  Filled 2019-05-11: qty 10

## 2019-05-11 MED ORDER — OXYCODONE HCL 5 MG/5ML PO SOLN: 5.0000 mg | Freq: Once | ORAL | Status: AC | PRN

## 2019-05-11 MED ORDER — BUPIVACAINE IN DEXTROSE 0.75-8.25 % IT SOLN
INTRATHECAL | Status: DC | PRN
  Administered 2019-05-11: 2 mL via INTRATHECAL

## 2019-05-11 MED ORDER — KETOROLAC TROMETHAMINE 15 MG/ML IJ SOLN
15.0000 mg | Freq: Four times a day (QID) | INTRAMUSCULAR | Status: AC
  Administered 2019-05-11 – 2019-05-12 (×4): 15 mg via INTRAVENOUS
  Filled 2019-05-11 (×3): qty 1

## 2019-05-11 MED ORDER — PROMETHAZINE HCL 25 MG PO TABS: 25.0000 mg | ORAL_TABLET | Freq: Four times a day (QID) | ORAL | 1 refills | Status: DC | PRN

## 2019-05-11 MED ORDER — HYDROCHLOROTHIAZIDE 25 MG PO TABS
25.0000 mg | ORAL_TABLET | Freq: Every day | ORAL | Status: DC
  Administered 2019-05-12 – 2019-05-19 (×8): 25 mg via ORAL
  Filled 2019-05-11 (×8): qty 1

## 2019-05-11 SURGICAL SUPPLY — 60 items
BAG DECANTER FOR FLEXI CONT (MISCELLANEOUS) ×3 IMPLANT
CELLS DAT CNTRL 66122 CELL SVR (MISCELLANEOUS) IMPLANT
COVER PERINEAL POST (MISCELLANEOUS) ×3 IMPLANT
COVER SURGICAL LIGHT HANDLE (MISCELLANEOUS) ×3 IMPLANT
COVER WAND RF STERILE (DRAPES) ×3 IMPLANT
CUP ACET PNNCL SECTR W/GRIP 56 (Hips) ×1 IMPLANT
DERMABOND ADVANCED (GAUZE/BANDAGES/DRESSINGS) ×2
DERMABOND ADVANCED .7 DNX12 (GAUZE/BANDAGES/DRESSINGS) ×1 IMPLANT
DRAPE C-ARM 42X72 X-RAY (DRAPES) ×3 IMPLANT
DRAPE POUCH INSTRU U-SHP 10X18 (DRAPES) ×3 IMPLANT
DRAPE STERI IOBAN 125X83 (DRAPES) ×3 IMPLANT
DRAPE U-SHAPE 47X51 STRL (DRAPES) ×6 IMPLANT
DRSG AQUACEL AG ADV 3.5X10 (GAUZE/BANDAGES/DRESSINGS) ×3 IMPLANT
DURAPREP 26ML APPLICATOR (WOUND CARE) ×6 IMPLANT
ELECT BLADE 4.0 EZ CLEAN MEGAD (MISCELLANEOUS) ×3
ELECT REM PT RETURN 9FT ADLT (ELECTROSURGICAL) ×3
ELECTRODE BLDE 4.0 EZ CLN MEGD (MISCELLANEOUS) ×1 IMPLANT
ELECTRODE REM PT RTRN 9FT ADLT (ELECTROSURGICAL) ×1 IMPLANT
GLOVE BIOGEL PI IND STRL 7.0 (GLOVE) ×1 IMPLANT
GLOVE BIOGEL PI INDICATOR 7.0 (GLOVE) ×2
GLOVE ECLIPSE 7.0 STRL STRAW (GLOVE) ×6 IMPLANT
GLOVE SKINSENSE NS SZ7.5 (GLOVE) ×2
GLOVE SKINSENSE STRL SZ7.5 (GLOVE) ×1 IMPLANT
GLOVE SURG SYN 7.5  E (GLOVE) ×8
GLOVE SURG SYN 7.5 E (GLOVE) ×4 IMPLANT
GOWN STRL REIN XL XLG (GOWN DISPOSABLE) ×3 IMPLANT
GOWN STRL REUS W/ TWL LRG LVL3 (GOWN DISPOSABLE) IMPLANT
GOWN STRL REUS W/ TWL XL LVL3 (GOWN DISPOSABLE) ×1 IMPLANT
GOWN STRL REUS W/TWL LRG LVL3 (GOWN DISPOSABLE)
GOWN STRL REUS W/TWL XL LVL3 (GOWN DISPOSABLE) ×2
HANDPIECE INTERPULSE COAX TIP (DISPOSABLE) ×2
HEAD CERAMIC 36 PLUS5 (Hips) ×3 IMPLANT
HOOD PEEL AWAY FLYTE STAYCOOL (MISCELLANEOUS) ×6 IMPLANT
IV NS IRRIG 3000ML ARTHROMATIC (IV SOLUTION) ×3 IMPLANT
KIT BASIN OR (CUSTOM PROCEDURE TRAY) ×3 IMPLANT
MARKER SKIN DUAL TIP RULER LAB (MISCELLANEOUS) ×3 IMPLANT
NEEDLE SPNL 18GX3.5 QUINCKE PK (NEEDLE) ×3 IMPLANT
PACK TOTAL JOINT (CUSTOM PROCEDURE TRAY) ×3 IMPLANT
PACK UNIVERSAL I (CUSTOM PROCEDURE TRAY) ×3 IMPLANT
PINN SECTOR W/GRIP ACE CUP 56 (Hips) ×3 IMPLANT
PINNACLE ALTRX PLUS 4 N 36X56 (Hips) ×3 IMPLANT
RTRCTR WOUND ALEXIS 18CM MED (MISCELLANEOUS)
SAW OSC TIP CART 19.5X105X1.3 (SAW) ×3 IMPLANT
SCREW 6.5MMX25MM (Screw) ×3 IMPLANT
SET HNDPC FAN SPRY TIP SCT (DISPOSABLE) ×1 IMPLANT
STAPLER VISISTAT 35W (STAPLE) IMPLANT
STEM FEMORAL SZ5 HIGH ACTIS (Stem) ×3 IMPLANT
SUT ETHIBOND 2 V 37 (SUTURE) ×6 IMPLANT
SUT ETHILON 3 0 PS 1 (SUTURE) ×9 IMPLANT
SUT VIC AB 0 CT1 27 (SUTURE) ×2
SUT VIC AB 0 CT1 27XBRD ANBCTR (SUTURE) ×1 IMPLANT
SUT VIC AB 1 CTX 36 (SUTURE) ×2
SUT VIC AB 1 CTX36XBRD ANBCTR (SUTURE) ×1 IMPLANT
SUT VIC AB 2-0 CT1 (SUTURE) ×3 IMPLANT
SUT VIC AB 2-0 CT1 27 (SUTURE) ×4
SUT VIC AB 2-0 CT1 TAPERPNT 27 (SUTURE) ×2 IMPLANT
SYR 50ML LL SCALE MARK (SYRINGE) ×3 IMPLANT
TOWEL GREEN STERILE (TOWEL DISPOSABLE) ×3 IMPLANT
TRAY CATH 16FR W/PLASTIC CATH (SET/KITS/TRAYS/PACK) IMPLANT
YANKAUER SUCT BULB TIP NO VENT (SUCTIONS) ×3 IMPLANT

## 2019-05-11 NOTE — Telephone Encounter (Signed)
Fax number to go with previous message on patient. Thanks.

## 2019-05-11 NOTE — Telephone Encounter (Signed)
04/16/2019 ov note faxed

## 2019-05-11 NOTE — Plan of Care (Signed)
  Problem: Clinical Measurements: Goal: Respiratory complications will improve Outcome: Progressing Note: On room air   Problem: Coping: Goal: Level of anxiety will decrease Outcome: Progressing   Problem: Safety: Goal: Ability to remain free from injury will improve Outcome: Progressing

## 2019-05-11 NOTE — Op Note (Signed)
RIGHT TOTAL HIP ARTHROPLASTY ANTERIOR APPROACH  Procedure Note Erik Black   [id number]  Pre-op Diagnosis: right hip degenerative joint disease     Post-op Diagnosis: same   Operative Procedures  1. Total hip replacement; Right hip; uncemented [id number]   Personnel  Surgeon(s): Tarry Kos, MD  Assist: Oneal Grout, PA-C; necessary for the timely completion of procedure and due to complexity of procedure.   Anesthesia: spinal  Prosthesis: Depuy Acetabulum: Pinnacle 56 mm Femur: Actis HO 5 Head: 36 mm size: +5 Liner: +4 neutral Bearing Type: ceramic on poly  Total Hip Arthroplasty (Anterior Approach) Op Note:  After informed consent was obtained and the operative extremity marked in the holding area, the patient was brought back to the operating room and placed supine on the HANA table. Next, the operative extremity was prepped and draped in normal sterile fashion. Surgical timeout occurred verifying patient identification, surgical site, surgical procedure and administration of antibiotics.  A modified anterior Smith-Peterson approach to the hip was performed, using the interval between tensor fascia lata and sartorius.  Dissection was carried bluntly down onto the anterior hip capsule. The lateral femoral circumflex vessels were identified and coagulated. A capsulotomy was performed and the capsular flaps tagged for later repair.  Fluoroscopy was utilized to prepare for the femoral neck cut. The neck osteotomy was performed. The femoral head was removed, the acetabular rim was cleared of soft tissue and attention was turned to reaming the acetabulum.  Sequential reaming was performed under fluoroscopic guidance. We reamed to a size 55 mm, and then impacted the acetabular shell. The liner was then placed after irrigation and attention turned to the femur.  After placing the femoral hook, the leg was taken to externally rotated, extended and adducted position taking  care to perform soft tissue releases to allow for adequate mobilization of the femur. Soft tissue was cleared from the shoulder of the greater trochanter and the hook elevator used to improve exposure of the proximal femur. Sequential broaching performed up to a size 5. Trial neck and head were placed. The leg was brought back up to neutral and the construct reduced. The position and sizing of components, offset and leg lengths were checked using fluoroscopy. Stability of the construct was checked in extension and external rotation without any subluxation or impingement of prosthesis. We dislocated the prosthesis, dropped the leg back into position, removed trial components, and irrigated copiously. The final stem and head was then placed, the leg brought back up, the system reduced and fluoroscopy used to verify positioning.  We irrigated, obtained hemostasis and closed the capsule using #2 ethibond suture.  One gram of vancomycin powder was placed in the surgical bed. The fascia was closed with #1 vicryl plus, the deep fat layer was closed with 0 vicryl, the subcutaneous layers closed with 2.0 Vicryl Plus and the skin closed with 2.0 nylon and dermabond. A sterile dressing was applied. The patient was awakened in the operating room and taken to recovery in stable condition.  All sponge, needle, and instrument counts were correct at the end of the case.   Position: supine  Complications: see description of procedure.  Time Out: performed   Drains/Packing: none  Estimated blood loss: see anesthesia record  Returned to Recovery Room: in good condition.   Antibiotics: yes   Mechanical VTE (DVT) Prophylaxis: sequential compression devices, TED thigh-high  Chemical VTE (DVT) Prophylaxis: aspirin   Fluid Replacement: see anesthesia record  Specimens Removed: 1 to pathology  Sponge and Instrument Count Correct? yes   PACU: portable radiograph - low AP   Plan/RTC: Return in 2 weeks for staple  removal. Weight Bearing/Load Lower Extremity: full  Hip precautions: none Suture Removal: 2 weeks   N. Eduard Roux, MD [facility] 11:30 AM   Implant Name Type Inv. Item Serial No. Manufacturer Lot No. LRB No. Used Action  PINN SECTOR W/GRIP ACE CUP 56 - [id number] Hips PINN SECTOR W/GRIP ACE CUP 56  DE[employer] [id number] Right 1 Implanted  PINNACLE ALTRX PLUS 4 N 36X56 - [id number] Hips PINNACLE ALTRX PLUS 4 N 36X56  DEPU[employer] [id number] Right 1 Implanted  SCREW 6.5MMX25MM - [id number] Screw SCREW 6.5MMX25MM  DEPU[employer] [id number] Right 1 Implanted  HEAD CERAMIC 36 PLUS5 - [id number] Hips HEAD CERAMIC 36 PLUS5  DEPUY SYN[employer] [id number] Right 1 Implanted  STEM FEMORAL SZ5 HIGH ACTIS - [id number] Stem STEM FEMORAL SZ5 HIGH ACTIS  [employer]PUY ORTHOPAED[employer] [id number] Right 1 Implanted

## 2019-05-11 NOTE — Transfer of Care (Signed)
Immediate Anesthesia Transfer of Care Note  Patient: Erik Black  Procedure(s) Performed: RIGHT TOTAL HIP ARTHROPLASTY ANTERIOR APPROACH (Right Hip)  Patient Location: PACU  Anesthesia Type:Spinal  Level of Consciousness: awake, alert  and oriented  Airway & Oxygen Therapy: Patient Spontanous Breathing and Patient connected to nasal cannula oxygen  Post-op Assessment: Report given to RN and Post -op Vital signs reviewed and stable  Post vital signs: Reviewed and stable  Last Vitals:  Vitals Value Taken Time  BP 114/62 05/11/19 1208  Temp    Pulse 51 05/11/19 1219  Resp 11 05/11/19 1219  SpO2 100 % 05/11/19 1219  Vitals shown include unvalidated device data.  Last Pain:  Vitals:   05/11/19 0748  TempSrc:   PainSc: 10-Worst pain ever      Patients Stated Pain Goal: 4 (63/84/53 6468)  Complications: No apparent anesthesia complications

## 2019-05-11 NOTE — H&P (Signed)
PREOPERATIVE H&P  Chief Complaint: right hip degenerative joint disease  HPI: Erik Black is a 68 y.o. male who presents for surgical treatment of right hip degenerative joint disease.  He denies any changes in medical history.  Past Medical History:  Diagnosis Date  . Anxiety   . Arthritis   . Depression   . Diabetes mellitus without complication (HCC)   . GERD (gastroesophageal reflux disease)   . Glaucoma    both eyes  . Hypertension   . Sleep apnea    uses Cpap nightly   Past Surgical History:  Procedure Laterality Date  . CARPAL TUNNEL RELEASE Bilateral   . COLONOSCOPY    . EYE SURGERY Bilateral    cataract surgery with lens implants  . KNEE ARTHROPLASTY Right   . KNEE ARTHROPLASTY Left   . KNEE ARTHROSCOPY Right   . KNEE ARTHROSCOPY Left   . MULTIPLE TOOTH EXTRACTIONS    . ROTATOR CUFF REPAIR Right   . ROTATOR CUFF REPAIR W/ DISTAL CLAVICLE EXCISION Left   . VASECTOMY     Social History   Socioeconomic History  . Marital status: Married    Spouse name: Not on file  . Number of children: Not on file  . Years of education: Not on file  . Highest education level: Not on file  Occupational History  . Not on file  Social Needs  . Financial resource strain: Not on file  . Food insecurity    Worry: Not on file    Inability: Not on file  . Transportation needs    Medical: Not on file    Non-medical: Not on file  Tobacco Use  . Smoking status: Current Some Day Smoker  . Smokeless tobacco: Never Used  . Tobacco comment: smokes a pack a month  Substance and Sexual Activity  . Alcohol use: Not Currently  . Drug use: Not Currently  . Sexual activity: Not on file  Lifestyle  . Physical activity    Days per week: Not on file    Minutes per session: Not on file  . Stress: Not on file  Relationships  . Social Musician on phone: Not on file    Gets together: Not on file    Attends religious service: Not on file    Active member of  club or organization: Not on file    Attends meetings of clubs or organizations: Not on file    Relationship status: Not on file  Other Topics Concern  . Not on file  Social History Narrative  . Not on file   History reviewed. No pertinent family history. No Known Allergies Prior to Admission medications   Medication Sig Start Date End Date Taking? Authorizing Provider  amLODipine (NORVASC) 10 MG tablet Take 10 mg by mouth daily. 01/26/19  Yes [provider]  aspirin EC 81 MG tablet Take 81 mg by mouth daily.   Yes [provider]  BELBUCA 450 MCG FILM Take 450 mcg by mouth 2 (two) times daily.  05/01/19  Yes [provider]  Cholecalciferol (VITAMIN D) 50 MCG (2000 UT) tablet Take 2,000 Units by mouth daily. 03/03/19  Yes [provider]  doxazosin (CARDURA) 8 MG tablet Take 8 mg by mouth daily. 01/16/19  Yes [provider]  DULoxetine (CYMBALTA) 60 MG capsule Take 60 mg by mouth daily. 01/07/19  Yes [provider]  fluticasone (FLONASE) 50 MCG/ACT nasal spray Place 1 spray into both nostrils  as needed. 04/04/19  Yes [provider]  hydrochlorothiazide (HYDRODIURIL) 25 MG tablet Take 25 mg by mouth daily. 01/16/19  Yes [provider]  latanoprost (XALATAN) 0.005 % ophthalmic solution Place 1 drop into both eyes at bedtime.   Yes [provider]  losartan (COZAAR) 100 MG tablet Take 100 mg by mouth daily.   Yes [provider]  metoprolol tartrate (LOPRESSOR) 50 MG tablet Take 50 mg by mouth 2 (two) times daily.  01/16/19  Yes [provider]  Multiple Vitamins-Minerals (MULTIVITAMIN WITH MINERALS) tablet Take 1 tablet by mouth daily.   Yes [provider]  omega-3 acid ethyl esters (LOVAZA) 1 g capsule Take 2 g by mouth 3 (three) times daily. 300 mg EPA/2mg -dha   Yes [provider]  omeprazole (PRILOSEC) 20 MG capsule Take 20 mg by mouth daily. 01/26/19  Yes [provider]  oxyCODONE (OXYCONTIN) 10 mg 12 hr tablet Take 10 mg by mouth 3 (three) times daily as needed (pain).   Yes [provider]  phentermine (ADIPEX-P) 37.5 MG tablet Take 37.5 mg by mouth daily. 04/25/19  Yes [provider]  potassium chloride SA (KLOR-CON) 20 MEQ tablet Take 40-60 mEq by mouth daily. 60 meq in the morning and 40 meq in the evening 04/07/19  Yes [provider]  SIMBRINZA 1-0.2 % SUSP Place 1 drop into both eyes 3 (three) times daily. 04/04/19  Yes [provider]  timolol (TIMOPTIC-XR) 0.5 % ophthalmic gel-forming Place 1 drop into both eyes at bedtime. 04/04/19  Yes [provider]  TRULICITY 7.62 GB/1.5VV SOPN Inject 0.75 mg into the skin once a week. Tuesday 05/01/19  Yes [provider]     Positive ROS: All other systems have been reviewed and were otherwise negative with the exception of those mentioned in the HPI and as above.  Physical Exam: General: Alert, no acute distress Cardiovascular: No pedal edema Respiratory: No cyanosis, no use of accessory musculature GI: abdomen soft Skin: No lesions in the area of chief complaint Neurologic: Sensation intact distally Psychiatric: Patient is competent for consent with normal mood and affect Lymphatic: no lymphedema  MUSCULOSKELETAL: exam stable  Assessment: right hip degenerative joint disease  Plan: Plan for Procedure(s): RIGHT TOTAL HIP ARTHROPLASTY ANTERIOR APPROACH  The risks benefits and alternatives were discussed with the patient including but not limited to the risks of nonoperative treatment, versus surgical intervention including infection, bleeding, nerve injury,  blood clots, cardiopulmonary complications, morbidity, mortality, among others, and they were willing to proceed.   Preoperative templating of the joint replacement has been completed, documented, and submitted to the Operating Room personnel in order to optimize intra-operative equipment  management.  Eduard Roux, MD   05/11/2019 7:23 AM

## 2019-05-11 NOTE — Anesthesia Procedure Notes (Signed)
Spinal  Patient location during procedure: OR Start time: 05/11/2019 9:28 AM End time: 05/11/2019 9:36 AM Staffing Anesthesiologist: Audry Pili, MD Performed: anesthesiologist  Preanesthetic Checklist Completed: patient identified, surgical consent, pre-op evaluation, timeout performed, IV checked, risks and benefits discussed and monitors and equipment checked Spinal Block Patient position: sitting Prep: DuraPrep Patient monitoring: heart rate, cardiac monitor, continuous pulse ox and blood pressure Approach: midline Location: L2-3 Injection technique: single-shot Needle Needle type: Quincke  Needle gauge: 22 G Needle length: 12.7 cm Additional Notes Consent was obtained prior to the procedure with all questions answered and concerns addressed. Risks including, but not limited to, bleeding, infection, nerve damage, paralysis, failed block, inadequate analgesia, allergic reaction, high spinal, itching, and headache were discussed and the patient wished to proceed. Functioning IV was confirmed and monitors were applied. Sterile prep and drape, including hand hygiene, mask, and sterile gloves were used. The patient was positioned and the spine was prepped. The skin was anesthetized with lidocaine. Free flow of clear CSF was obtained prior to injecting local anesthetic into the CSF. The spinal needle aspirated freely following injection. The needle was carefully withdrawn. The patient tolerated the procedure well.   Renold Don, MD

## 2019-05-11 NOTE — Telephone Encounter (Signed)
Are you able to fax this for [name]?  Thanks.

## 2019-05-11 NOTE — Telephone Encounter (Signed)
04/16/2019 ov note & 11/9 op note faxed [place] [phone]

## 2019-05-11 NOTE — Anesthesia Postprocedure Evaluation (Signed)
Anesthesia Post Note  Patient: Erik Black  Procedure(s) Performed: RIGHT TOTAL HIP ARTHROPLASTY ANTERIOR APPROACH (Right Hip)     Patient location during evaluation: PACU Anesthesia Type: Spinal Level of consciousness: awake and alert Pain management: pain level controlled Vital Signs Assessment: post-procedure vital signs reviewed and stable Respiratory status: spontaneous breathing and respiratory function stable Cardiovascular status: blood pressure returned to baseline and stable Postop Assessment: spinal receding and no apparent nausea or vomiting Anesthetic complications: no    Last Vitals:  Vitals:   05/11/19 1238 05/11/19 1253  BP: (!) 145/77 (!) 146/77  Pulse: (!) 50 (!) 47  Resp: 11 13  Temp:    SpO2: 100% 100%    Last Pain:  Vitals:   05/11/19 1238  TempSrc:   PainSc: [name]

## 2019-05-11 NOTE — Anesthesia Procedure Notes (Signed)
Procedure Name: MAC Date/Time: 05/11/2019 9:18 AM Performed by: Mariea Clonts, CRNA Pre-anesthesia Checklist: Patient identified, Emergency Drugs available, Suction available, Patient being monitored and Timeout performed Oxygen Delivery Method: Nasal cannula and Simple face mask

## 2019-05-12 ENCOUNTER — Encounter (HOSPITAL_COMMUNITY): Payer: Self-pay | Admitting: Orthopaedic Surgery

## 2019-05-12 LAB — BASIC METABOLIC PANEL
Anion gap: 8 (ref 5–15)
BUN: 15 mg/dL (ref 8–23)
CO2: 27 mmol/L (ref 22–32)
Calcium: 8.9 mg/dL (ref 8.9–10.3)
Chloride: 101 mmol/L (ref 98–111)
Creatinine, Ser: 1.13 mg/dL (ref 0.61–1.24)
GFR calc Af Amer: >60 mL/min (ref >60)
GFR calc non Af Amer: >60 mL/min (ref >60)
Glucose, Bld: 123 mg/dL — ABNORMAL HIGH (ref 70–99)
Potassium: 2.8 mmol/L — ABNORMAL LOW (ref 3.5–5.1)
Sodium: 136 mmol/L (ref 135–145)

## 2019-05-12 LAB — CBC
HCT: 35 % — ABNORMAL LOW (ref 39.0–52.0)
Hemoglobin: 11.7 g/dL — ABNORMAL LOW (ref 13.0–17.0)
MCH: 28.7 pg (ref 26.0–34.0)
MCHC: 33.4 g/dL (ref 30.0–36.0)
MCV: 85.8 fL (ref 80.0–100.0)
Platelets: 110 10*3/uL — ABNORMAL LOW (ref 150–400)
RBC: 4.08 MIL/uL — ABNORMAL LOW (ref 4.22–5.81)
RDW: 13.6 % (ref 11.5–15.5)
WBC: 10.5 10*3/uL (ref 4.0–10.5)
nRBC: 0 % (ref 0.0–0.2)

## 2019-05-12 LAB — GLUCOSE, CAPILLARY
Glucose-Capillary: 105 mg/dL — ABNORMAL HIGH (ref 70–99)
Glucose-Capillary: 121 mg/dL — ABNORMAL HIGH (ref 70–99)
Glucose-Capillary: 133 mg/dL — ABNORMAL HIGH (ref 70–99)
Glucose-Capillary: 186 mg/dL — ABNORMAL HIGH (ref 70–99)

## 2019-05-12 NOTE — TOC Initial Note (Signed)
Transition of Care [facility]) - Initial/Assessment Note    Patient Details  Name: Erik Black MRN: [id number] Date of Birth: 26-May-1951  Transition of Care [facility]) CM/SW Contact:    Truddie Hidden, LCSW Phone Number: 05/12/2019, 2:51 PM  Clinical Narrative:                 PT recommending SNF or home with 24 hr assistance. Patient states that he needs to go to a rehab to get stronger. Patient states that he would like to use his VA coverage. CSW spoke with [place], [place] at [facility] [phone] ext [phone] who states that they are not accepting anyone from the community. Patient's information will need to be sent into their screening committee. They will review it and if approved they will award a contract.   Plan is to send in the needed information for patient's information to be reviewed.  Expected Discharge Plan: Skilled Nursing Facility Barriers to Discharge: No SNF bed, Continued Medical Work up   Patient Goals and CMS Choice Patient states their goals for this hospitalization and ongoing recovery are:: go to rehab CMS Medicare.gov Compare Post Acute Care list provided to:: Patient Choice offered to / list presented to : Patient  Expected Discharge Plan and Services Expected Discharge Plan: Skilled Nursing Facility In-house Referral: Clinical Social Work   Post Acute Care Choice: Skilled Nursing Facility Living arrangements for the past 2 months: Single Family Home                                      Prior Living Arrangements/Services Living arrangements for the past 2 months: Single Family Home Lives with:: Spouse   Do you feel safe going back to the place where you live?: No   I need rehab  Need for Family Participation in Patient Care: Yes (Comment) Care giver support system in place?: Yes (comment)   Criminal Activity/Legal Involvement Pertinent to Current Situation/Hospitalization: No - Comment as needed  Activities of Daily Living Home Assistive  Devices/Equipment: CBG Meter, CPAP, Eyeglasses, Grab bars around toilet, Grab bars in shower, Reacher, Walker (specify type), Raised toilet seat with rails ADL Screening (condition at time of admission) Patient's cognitive ability adequate to safely complete daily activities?: Yes Is the patient deaf or have difficulty hearing?: No Does the patient have difficulty seeing, even when wearing glasses/contacts?: No Does the patient have difficulty concentrating, remembering, or making decisions?: No Patient able to express need for assistance with ADLs?: Yes Does the patient have difficulty dressing or bathing?: Yes Independently performs ADLs?: No Communication: Independent Dressing (OT): Needs assistance Is this a change from baseline?: Pre-admission baseline Grooming: Independent Feeding: Independent Bathing: Needs assistance Is this a change from baseline?: Pre-admission baseline Toileting: Independent In/Out Bed: Independent with device (comment)(walker) Walks in Home: Independent with device (comment)(walker) Does the patient have difficulty walking or climbing stairs?: Yes Weakness of Legs: Both Weakness of Arms/Hands: None  Permission Sought/Granted                  Emotional Assessment Appearance:: Appears stated age Attitude/Demeanor/Rapport: Gracious   Orientation: : Oriented to Self, Oriented to Place, Oriented to  Time, Oriented to Situation      Admission diagnosis:  right hip degenerative joint disease Patient Active Problem List   Diagnosis Date Noted  . Status post total replacement of right hip 05/11/2019  . Primary osteoarthritis of right hip 04/16/2019  [facility]P:  [facility]   [id number] - [place], [place] - [address] [place] [address] Phone: [phone] Fax: [phone]     Social Determinants of Health (SDOH) Interventions    Readmission Risk Interventions No flowsheet data found.

## 2019-05-12 NOTE — Progress Notes (Signed)
Physical Therapy Treatment Patient Details Name: Erik Black MRN: [id number] DOB: [birth year] Today's Date: 05/12/2019    History of Present Illness 68 y.o. male admitted on 05/11/19 for elective R THA direct anterior approach, WBAT post op.  Pt with significant PMH of HTN, glaucoma, DM, bil RTC repair, bil TKA, bil carpal tunnel release.      PT Comments    Pt is POD #1 and this is his second session.  We were able to progress gait into the hallway with RW and min assist overall.  His hardest piece of mobility is his transitions from sitting <-> standing.  He was able to complete the entire HEP with assist.  PT will continue to follow acutely for safe mobility progression.   Follow Up Recommendations  SNF;Follow surgeon's recommendation for DC plan and follow-up therapies(Pt requesting [facility])     Equipment Recommendations  None recommended by PT    Recommendations for Other Services   NA     Precautions / Restrictions Precautions Precautions: Fall Restrictions Weight Bearing Restrictions: Yes RLE Weight Bearing: Weight bearing as tolerated    Mobility  Bed Mobility Overal bed mobility: Needs Assistance Bed Mobility: Supine to Sit;Sit to Supine     Supine to sit: Min assist;HOB elevated Sit to supine: Min assist   General bed mobility comments: Min assist to help progress R leg to EOB and separately support trunk during transition up to sitting EOB.  Verbal cues for 1/2 bridge technique and sequencing. Heavy reliance on hands for support at trunk on bedrails. Min assist to help lift right leg back into flat bed using momentum and bed rails for control at trunk.   Transfers Overall transfer level: Needs assistance Equipment used: Rolling walker (2 wheeled) Transfers: Sit to/from Stand Sit to Stand: Min assist;From elevated surface         General transfer comment: Heavy min assist from elevated bed.  Cues for safe hand placement.  Heavy min assist to help  control descent to lower to bed.  Pt gets part of his leverage to get up from lower legs pushing against baseboard of bed.   Ambulation/Gait Ambulation/Gait assistance: Min guard Gait Distance (Feet): 40 Feet Assistive device: Rolling walker (2 wheeled) Gait Pattern/deviations: Step-to pattern;Antalgic Gait velocity: decreased   General Gait Details: Pt with mildly antalgic gait pattern, pt able to demonstrate safe LE sequencing,cue for upright posture.        Balance Overall balance assessment: Needs assistance Sitting-balance support: Feet supported;No upper extremity supported Sitting balance-Leahy Scale: Good Sitting balance - Comments: static sitting supervision   Standing balance support: Bilateral upper extremity supported Standing balance-Leahy Scale: Poor Standing balance comment: needs external support from RW, but static standing with AD supervision.                             Cognition Arousal/Alertness: Awake/alert Behavior During Therapy: WFL for tasks assessed/performed Overall Cognitive Status: Within Functional Limits for tasks assessed                                        Exercises Total Joint Exercises Ankle Circles/Pumps: AROM;Both;20 reps Quad Sets: AROM;Right;10 reps Short Arc Quad: AROM;Right;10 reps(cramping in thigh with this one) Heel Slides: AAROM;Right;10 reps Hip ABduction/ADduction: AAROM;Right;Supine;Standing;20 reps Long Arc Quad: AROM;Right;10 reps Knee Flexion: AROM;Right;10 reps;Standing Marching in Standing: AROM;Right;10 reps;Standing Standing  Hip Extension: AROM;Right;10 reps;Standing        Pertinent Vitals/Pain Pain Assessment: Faces Faces Pain Scale: Hurts little more Pain Location: R thigh Pain Descriptors / Indicators: Cramping Pain Intervention(s): Limited activity within patient's tolerance;Monitored during session;Repositioned;Ice applied           PT Goals (current goals can now be  found in the care plan section) Acute Rehab PT Goals Patient Stated Goal: to not be a burden on his wife Progress towards PT goals: Progressing toward goals    Frequency    7X/week      PT Plan Current plan remains appropriate       AM-PAC PT "6 Clicks" Mobility   Outcome Measure  Help needed turning from your back to your side while in a flat bed without using bedrails?: A Little Help needed moving from lying on your back to sitting on the side of a flat bed without using bedrails?: A Little Help needed moving to and from a bed to a chair (including a wheelchair)?: A Little Help needed standing up from a chair using your arms (e.g., wheelchair or bedside chair)?: A Little Help needed to walk in hospital room?: A Little Help needed climbing 3-5 steps with a railing? : A Lot 6 Click Score: 17    End of Session   Activity Tolerance: Patient limited by pain;Patient limited by fatigue Patient left: with call bell/phone within reach;in bed   PT Visit Diagnosis: Muscle weakness (generalized) (M62.81);Difficulty in walking, not elsewhere classified (R26.2);Pain Pain - Right/Left: Right Pain - part of body: Hip     Time: 2956-2130 PT Time Calculation (min) (ACUTE ONLY): 37 min  Charges:  $Gait Training: 8-22 mins $Therapeutic Exercise: 8-22 mins                    Corinna Capra, PT, DPT  Acute Rehabilitation [phone] pager #[phone] office  @ [facility]: [phone]   05/12/2019, 3:12 PM

## 2019-05-12 NOTE — Evaluation (Signed)
Physical Therapy Evaluation Patient Details Name: Erik Black MRN: [id number] DOB: 10-31-1950 Today's Date: 05/12/2019   History of Present Illness  68 y.o. male admitted on 05/11/19 for elective R THA direct anterior approach, WBAT post op.  Pt with significant PMH of HTN, glaucoma, DM, bil RTC repair, bil TKA, bil carpal tunnel release.    Clinical Impression  Pt is POD #1 R THA and was able to get up and ambulate around his room with one person assist.  He is slow to move and cautious due to R knee "pops".  His right hip is feeling better than before surgery he reports.  He is appropriate for SNF level rehab at discharge if he feels his wife cannot provide the assist he needs to go home safely.  Will follow MD's plan and advice.   PT to follow acutely for deficits listed below.    Follow Up Recommendations SNF;Follow surgeon's recommendation for DC plan and follow-up therapies(Pt requesting [facility])    Equipment Recommendations  None recommended by PT    Recommendations for Other Services   NA    Precautions / Restrictions Precautions Precautions: Fall Restrictions Weight Bearing Restrictions: Yes RLE Weight Bearing: Weight bearing as tolerated      Mobility  Bed Mobility Overal bed mobility: Needs Assistance Bed Mobility: Supine to Sit     Supine to sit: Min assist;HOB elevated     General bed mobility comments: Min assist to help progress R leg to EOB and separately support trunk during transition up to sitting EOB.  Verbal cues for 1/2 bridge technique and sequencing. Heavy reliance on hands for support at trunk on bedrails.   Transfers Overall transfer level: Needs assistance Equipment used: Rolling walker (2 wheeled) Transfers: Sit to/from Stand Sit to Stand: Min assist;From elevated surface         General transfer comment: Heavy min assist from elevated bed.  Cues for safe hand placement.  Heavy min assist to help control descent to lower recliner  chair.   Ambulation/Gait Ambulation/Gait assistance: Min guard Gait Distance (Feet): 20 Feet Assistive device: Rolling walker (2 wheeled) Gait Pattern/deviations: Step-to pattern;Antalgic Gait velocity: decreased   General Gait Details: Pt with mildly antalgic gait pattern, cues for LE sequencing, to breathe during gait, for WBAT status of R leg and for upright posture.          Balance Overall balance assessment: Needs assistance Sitting-balance support: Feet supported;No upper extremity supported Sitting balance-Leahy Scale: Good Sitting balance - Comments: static sitting supervision   Standing balance support: Bilateral upper extremity supported Standing balance-Leahy Scale: Poor Standing balance comment: needs external support from RW, but static standing with AD supervision.                              Pertinent Vitals/Pain Pain Assessment: No/denies pain    Home Living Family/patient expects to be discharged to:: Skilled nursing facility(would like to go to SNF rehab at [facility]) Living Arrangements: Spouse/significant other Available Help at Discharge: Family;Available 24 hours/day(but for supervision) Type of Home: House Home Access: Stairs to enter Entrance Stairs-Rails: Right;Left;Can reach both Entrance Stairs-Number of Steps: 5 Home Layout: One level Home Equipment: Walker - 2 wheels;Cane - single point;Grab bars - toilet;Grab bars - tub/shower;Tub bench      Prior Function Level of Independence: Independent with assistive device(s)         Comments: used RW PTA, has been sponge bathing because  even on the tub transfer bench he has a hard time lifting his legs into the tub.      Hand Dominance   Dominant Hand: Right    Extremity/Trunk Assessment   Upper Extremity Assessment Upper Extremity Assessment: Defer to OT evaluation(h/o bil RTC repairs)    Lower Extremity Assessment Lower Extremity Assessment: RLE deficits/detail RLE  Deficits / Details: right leg with normal post op pain and weakness.  Ankle 4/5, knee 3/5, hip 2+/5    Cervical / Trunk Assessment Cervical / Trunk Assessment: Normal  Communication   Communication: No difficulties  Cognition Arousal/Alertness: Awake/alert Behavior During Therapy: WFL for tasks assessed/performed Overall Cognitive Status: Within Functional Limits for tasks assessed                                        General Comments General comments (skin integrity, edema, etc.): Exercise handout given     Exercises Total Joint Exercises Ankle Circles/Pumps: AROM;Both;20 reps Quad Sets: AROM;Right;10 reps Heel Slides: AAROM;Right;10 reps Hip ABduction/ADduction: AAROM;Right;10 reps Long Arc Quad: AROM;Right;10 reps   Assessment/Plan    PT Assessment Patient needs continued PT services  PT Problem List Decreased strength;Decreased range of motion;Decreased activity tolerance;Decreased balance;Decreased mobility;Decreased knowledge of use of DME;Pain;Obesity       PT Treatment Interventions DME instruction;Gait training;Stair training;Functional mobility training;Therapeutic activities;Therapeutic exercise;Balance training;Patient/family education;Manual techniques;Modalities    PT Goals (Current goals can be found in the Care Plan section)  Acute Rehab PT Goals Patient Stated Goal: to not be a burden on his wife PT Goal Formulation: With patient Time For Goal Achievement: 05/26/19 Potential to Achieve Goals: Good    Frequency 7X/week   Barriers to discharge Decreased caregiver support wife can only provide supervision.        AM-PAC PT "6 Clicks" Mobility  Outcome Measure Help needed turning from your back to your side while in a flat bed without using bedrails?: A Little Help needed moving from lying on your back to sitting on the side of a flat bed without using bedrails?: A Little Help needed moving to and from a bed to a chair (including a  wheelchair)?: A Little Help needed standing up from a chair using your arms (e.g., wheelchair or bedside chair)?: A Little Help needed to walk in hospital room?: A Little Help needed climbing 3-5 steps with a railing? : A Lot 6 Click Score: 17    End of Session   Activity Tolerance: Patient limited by pain;Patient limited by fatigue Patient left: in chair;with call bell/phone within reach Nurse Communication: Mobility status PT Visit Diagnosis: Muscle weakness (generalized) (M62.81);Difficulty in walking, not elsewhere classified (R26.2);Pain Pain - Right/Left: Right Pain - part of body: Hip    Time: 7829-5621 PT Time Calculation (min) (ACUTE ONLY): 42 min   Charges:           Verdene Lennert, PT, DPT  Acute Rehabilitation [phone] pager #[phone] office  @ [facility]: [phone]   PT Evaluation $PT Eval Moderate Complexity: 1 Mod PT Treatments $Gait Training: 8-22 mins $Therapeutic Exercise: 8-22 mins        05/12/2019, 10:57 AM

## 2019-05-12 NOTE — Progress Notes (Signed)
CSW acknowledges consult. Awaiting PT/OT evaluation. Pending recommendations will follow up as needed.

## 2019-05-12 NOTE — NC FL2 (Signed)
[place] MEDICAID FL2 LEVEL OF CARE SCREENING TOOL     IDENTIFICATION  Patient Name: Erik Black Birthdate: 20-Jun-1951 Sex: male Admission Date (Current Location): 05/11/2019  [facility] and [place] Number:  Producer, television/film/video and Address:  [facility], [address] [place], [place] [address]      Provider Number:    Attending Physician Name and Address:  Tarry Kos, MD  Relative Name and Phone Number:  Kenwood Lawhun [phone]    Current Level of Care:   Recommended Level of Care:   Prior Approval Number:    Date Approved/Denied:   PASRR Number: [id number]  Discharge Plan: SNF    Current Diagnoses: Patient Active Problem List   Diagnosis Date Noted  . Status post total replacement of right hip 05/11/2019  . Primary osteoarthritis of right hip 04/16/2019    Orientation RESPIRATION BLADDER Height & Weight     Self, Time, Situation, Place  Normal Continent Weight: 290 lb (131.5 kg) Height:  6' (182.9 cm)  BEHAVIORAL SYMPTOMS/MOOD NEUROLOGICAL BOWEL NUTRITION STATUS      Continent Diet  AMBULATORY STATUS COMMUNICATION OF NEEDS Skin   Extensive Assist   Normal                       Personal Care Assistance Level of Assistance  Bathing, Dressing Bathing Assistance: Maximum assistance   Dressing Assistance: Maximum assistance     Functional Limitations Info             SPECIAL CARE FACTORS FREQUENCY  OT (By licensed OT), PT (By licensed PT)     PT Frequency: 5 times a week OT Frequency: 5 times a week            Contractures      Additional Factors Info                  Current Medications (05/12/2019):  This is the current hospital active medication list Current Facility-Administered Medications  Medication Dose Route Frequency Provider Last Rate Last Dose  . 0.9 %  sodium chloride infusion   Intravenous Continuous Tarry Kos, MD   Stopped at 05/12/19 0957  . acetaminophen (TYLENOL)  tablet 1,000 mg  1,000 mg Oral Q6H Tarry Kos, MD   1,000 mg at 05/12/19 1121  . acetaminophen (TYLENOL) tablet 325-650 mg  325-650 mg Oral Q6H PRN Tarry Kos, MD      . alum & mag hydroxide-simeth (MAALOX/MYLANTA) 200-200-20 MG/5ML suspension 30 mL  30 mL Oral Q4H PRN Tarry Kos, MD      . amLODipine (NORVASC) tablet 10 mg  10 mg Oral Daily Tarry Kos, MD   10 mg at 05/12/19 0946  . aspirin chewable tablet 81 mg  81 mg Oral BID Tarry Kos, MD   81 mg at 05/12/19 0947  . Buprenorphine HCl FILM 450 mcg  450 mcg Oral BID Tarry Kos, MD   450 mcg at 05/12/19 1118  . Chlorhexidine Gluconate Cloth 2 % PADS 6 each  6 each Topical Q0600 Jerald Kief, MD   6 each at 05/12/19 0545  . diphenhydrAMINE (BENADRYL) 12.5 MG/5ML elixir 25 mg  25 mg Oral Q4H PRN Tarry Kos, MD      . docusate sodium (COLACE) capsule 100 mg  100 mg Oral BID Tarry Kos, MD   100 mg at 05/12/19 0946  . doxazosin (CARDURA) tablet 8 mg  8 mg Oral Daily Leandrew Koyanagi, MD   8 mg at 05/12/19 0947  . Dulaglutide SOPN 0.75 mg  0.75 mg Subcutaneous Weekly Leandrew Koyanagi, MD      . DULoxetine (CYMBALTA) DR capsule 60 mg  60 mg Oral Daily Leandrew Koyanagi, MD   60 mg at 05/12/19 0947  . fluticasone (FLONASE) 50 MCG/ACT nasal spray 1 spray  1 spray Each Nare PRN Leandrew Koyanagi, MD      . gabapentin (NEURONTIN) capsule 300 mg  300 mg Oral TID Leandrew Koyanagi, MD   300 mg at 05/12/19 0946  . hydrochlorothiazide (HYDRODIURIL) tablet 25 mg  25 mg Oral Daily Leandrew Koyanagi, MD   25 mg at 05/12/19 0947  . HYDROmorphone (DILAUDID) injection 0.5-1 mg  0.5-1 mg Intravenous Q4H PRN Leandrew Koyanagi, MD   1 mg at 05/11/19 1538  . insulin aspart (novoLOG) injection 0-20 Units  0-20 Units Subcutaneous TID WC Leandrew Koyanagi, MD   3 Units at 05/12/19 1121  . insulin aspart (novoLOG) injection 0-5 Units  0-5 Units Subcutaneous QHS Leandrew Koyanagi, MD      . latanoprost (XALATAN) 0.005 % ophthalmic solution 1 drop  1 drop Both Eyes QHS Leandrew Koyanagi, MD      . losartan (COZAAR) tablet 100 mg  100 mg Oral Daily Leandrew Koyanagi, MD   100 mg at 05/12/19 0946  . magnesium citrate solution 1 Bottle  1 Bottle Oral Once PRN Leandrew Koyanagi, MD      . menthol-cetylpyridinium (CEPACOL) lozenge 3 mg  1 lozenge Oral PRN Leandrew Koyanagi, MD       Or  . phenol (CHLORASEPTIC) mouth spray 1 spray  1 spray Mouth/Throat PRN Leandrew Koyanagi, MD      . methocarbamol (ROBAXIN) tablet 750 mg  750 mg Oral Q6H PRN Leandrew Koyanagi, MD   750 mg at 05/11/19 1717   Or  . methocarbamol (ROBAXIN) 500 mg in dextrose 5 % 50 mL IVPB  500 mg Intravenous Q6H PRN Leandrew Koyanagi, MD      . metoCLOPramide (REGLAN) tablet 5-10 mg  5-10 mg Oral Q8H PRN Leandrew Koyanagi, MD       Or  . metoCLOPramide (REGLAN) injection 5-10 mg  5-10 mg Intravenous Q8H PRN Leandrew Koyanagi, MD      . metoprolol tartrate (LOPRESSOR) tablet 50 mg  50 mg Oral BID Leandrew Koyanagi, MD   50 mg at 05/12/19 0946  . ondansetron (ZOFRAN) tablet 4 mg  4 mg Oral Q6H PRN Leandrew Koyanagi, MD       Or  . ondansetron [facility]) injection 4 mg  4 mg Intravenous Q6H PRN Leandrew Koyanagi, MD      . oxyCODONE (Oxy IR/ROXICODONE) immediate release tablet 10-15 mg  10-15 mg Oral Q4H PRN Leandrew Koyanagi, MD   10 mg at 05/11/19 1852  . oxyCODONE (Oxy IR/ROXICODONE) immediate release tablet 5-10 mg  5-10 mg Oral Q4H PRN Leandrew Koyanagi, MD      . oxyCODONE (OXYCONTIN) 12 hr tablet 10 mg  10 mg Oral Q12H Leandrew Koyanagi, MD   10 mg at 05/12/19 0539  . polyethylene glycol (MIRALAX / GLYCOLAX) packet 17 g  17 g Oral Daily PRN Leandrew Koyanagi, MD      . potassium chloride SA (KLOR-CON) CR tablet 40 mEq  40 mEq Oral QPM Leandrew Koyanagi, MD  40 mEq at 05/11/19 1717  . potassium chloride SA (KLOR-CON) CR tablet 60 mEq  60 mEq Oral Daily Tarry Kos, MD   60 mEq at 05/12/19 0947  . sorbitol 70 % solution 30 mL  30 mL Oral Daily PRN Tarry Kos, MD      . timolol (TIMOPTIC-XR) 0.5 % ophthalmic gel-forming 1 drop  1 drop Both Eyes QHS Tarry Kos, MD          Discharge Medications: Please see discharge summary for a list of discharge medications.  Relevant Imaging Results:  Relevant Lab Results:   Additional Information ss #[id number]  Truddie Hidden, LCSW

## 2019-05-12 NOTE — Progress Notes (Signed)
1 Day Post-Op   Subjective/Chief Complaint: Doing well.  Minimal pain   Objective: Vital signs in last 24 hours: Temp:  [96.8 F (36 C)-98.7 F (37.1 C)] 98.5 F (36.9 C) (11/10 0326) Pulse Rate:  [47-86] 86 (11/10 0326) Resp:  [11-18] 18 (11/10 0326) BP: (114-160)/(62-105) 148/71 (11/10 0326) SpO2:  [98 %-100 %] 98 % (11/10 0326) Last BM Date: 05/10/19  Intake/Output from previous day: 11/09 0701 - 11/10 0700 In: 3156 [I.V.:2386.7; IV Piggyback:769.3] Out: 3425 [Urine:2925; Blood:500] Intake/Output this shift: No intake/output data recorded.  Incision/Wound:  Bandage clean, dry and intact   Lab Results:  Recent Labs    05/12/19 0338  WBC 10.5  HGB 11.7*  HCT 35.0*  PLT 110*   BMET Recent Labs    05/12/19 0338  NA 136  K 2.8*  CL 101  CO2 27  GLUCOSE 123*  BUN 15  CREATININE 1.13  CALCIUM 8.9   PT/INR No results for input(s): LABPROT, INR in the last 72 hours. ABG No results for input(s): PHART, HCO3 in the last 72 hours.  Invalid input(s): PCO2, PO2  Studies/Results: Dg Pelvis Portable  Result Date: 05/11/2019 CLINICAL DATA:  Hip joint replacement status EXAM: PORTABLE PELVIS 1-2 VIEWS COMPARISON:  None. FINDINGS: Total RIGHT hip arthroplasty. No fracture dislocation. Degenerative change of the LEFT hip joint. IMPRESSION: RIGHT total hip arthroplasty. Electronically Signed   By: Genevive Bi M.D.   On: 05/11/2019 13:28   Dg C-arm 1-60 Min  Result Date: 05/11/2019 CLINICAL DATA:  Status post right total hip arthroplasty. EXAM: DG C-ARM 1-60 MIN CONTRAST:  None. FLUOROSCOPY TIME:  Fluoroscopy Time:  28 seconds. Number of Acquired Spot Images: 2. COMPARISON:  January 24, 2018. FINDINGS: Two intraoperative fluoroscopic images were obtained of the right hip. The right acetabular and femoral components appear to be well situated. IMPRESSION: Fluoroscopic guidance provided during right total hip arthroplasty. Electronically Signed   By: Lupita Raider M.D.    On: 05/11/2019 12:01   Dg Hip Operative Unilat W Or W/o Pelvis Right  Result Date: 05/11/2019 CLINICAL DATA:  Primary osteoarthritis of the right hip. EXAM: OPERATIVE RIGHT HIP (WITH PELVIS IF PERFORMED) 1 VIEW TECHNIQUE: Fluoroscopic spot image(s) were submitted for interpretation post-operatively. COMPARISON:  RADIOGRAPHS DATED 01/24/2018 FINDINGS: Two ap C-arm images demonstrate that the acetabular and femoral components of the right total hip prosthesis appear in excellent position in the ap projection. No visible fractures. IMPRESSION: Satisfactory appearance of the right hip prosthesis. Electronically Signed   By: Francene Boyers M.D.   On: 05/11/2019 12:00    Anti-infectives: Anti-infectives (From admission, onward)   Start     Dose/Rate Route Frequency Ordered Stop   05/11/19 1545  ceFAZolin (ANCEF) IVPB 2g/100 mL premix     2 g 200 mL/hr over 30 Minutes Intravenous Every 6 hours 05/11/19 1543 05/12/19 0500   05/11/19 1545  ceFAZolin (ANCEF) 2-4 GM/100ML-% IVPB    Note to Pharmacy: Loraine Leriche  : cabinet override      05/11/19 1545 05/12/19 0359   05/11/19 1019  vancomycin (VANCOCIN) powder  Status:  Discontinued       As needed 05/11/19 1019 05/11/19 1204   05/11/19 0930  ceFAZolin (ANCEF) 3 g in dextrose 5 % 50 mL IVPB     3 g 100 mL/hr over 30 Minutes Intravenous To [facility] 05/08/19 1342 05/11/19 0957   05/11/19 0000  sulfamethoxazole-trimethoprim (BACTRIM DS) 800-160 MG tablet     1 tablet Oral 2 times  daily 05/11/19 1132        Assessment/Plan: s/p Procedure(s): RIGHT TOTAL HIP ARTHROPLASTY ANTERIOR APPROACH (Right) Advance diet  WBAT RLE ABLA- mild and stable Hypokalemia- on chronic potassium.  Will continue to monitor  Up with PT D/c today vs tomorrow depending on progress with PT- call for d/c order when ready  LOS: 1 day    Erik Black 05/12/2019

## 2019-05-12 NOTE — Progress Notes (Deleted)
MD paged via answering service for potassium of 2.8. Awaiting orders.

## 2019-05-12 NOTE — Plan of Care (Signed)
  Problem: Clinical Measurements: Goal: Respiratory complications will improve Outcome: Progressing   Problem: Activity: Goal: Risk for activity intolerance will decrease Outcome: Progressing   Problem: Nutrition: Goal: Adequate nutrition will be maintained Outcome: Progressing   Problem: Coping: Goal: Level of anxiety will decrease Outcome: Progressing   Problem: Elimination: Goal: Will not experience complications related to urinary retention Outcome: Progressing   Problem: Pain Managment: Goal: General experience of comfort will improve Outcome: Progressing   

## 2019-05-13 LAB — GLUCOSE, CAPILLARY
Glucose-Capillary: 99 mg/dL (ref 70–99)
Glucose-Capillary: 175 mg/dL — ABNORMAL HIGH (ref 70–99)
Glucose-Capillary: 112 mg/dL — ABNORMAL HIGH (ref 70–99)
Glucose-Capillary: 180 mg/dL — ABNORMAL HIGH (ref 70–99)
Glucose-Capillary: 134 mg/dL — ABNORMAL HIGH (ref 70–99)

## 2019-05-13 NOTE — Discharge Summary (Addendum)
Patient ID: Erik Black MRN: [id number] DOB/AGE: February 27, 1951 68 y.o.  Admit date: 05/11/2019 Discharge date: 05/19/2019  Admission Diagnoses:  Active Problems:   Status post total replacement of right hip   Discharge Diagnoses:  Same  Past Medical History:  Diagnosis Date  . Anxiety   . Arthritis   . Depression   . Diabetes mellitus without complication ([place])   . GERD (gastroesophageal reflux disease)   . Glaucoma    both eyes  . Hypertension   . Sleep apnea    uses Cpap nightly    Surgeries: Procedure(s): RIGHT TOTAL HIP ARTHROPLASTY ANTERIOR APPROACH on 05/11/2019   Consultants:   Discharged Condition: Improved  Hospital Course: Ether Wolters is an 68 y.o. male who was admitted 05/11/2019 for operative treatment of right hip osteoarthritis. Patient has severe unremitting pain that affects sleep, daily activities, and work/hobbies. After pre-op clearance the patient was taken to the operating room on 05/11/2019 and underwent  Procedure(s): RIGHT TOTAL HIP ARTHROPLASTY ANTERIOR APPROACH.    Patient was given perioperative antibiotics:  Anti-infectives (From admission, onward)   Start     Dose/Rate Route Frequency Ordered Stop   05/19/19 1000  sulfamethoxazole-trimethoprim (BACTRIM DS) 800-160 MG per tablet 1 tablet     1 tablet Oral Every 12 hours 05/19/19 0841     05/11/19 1545  ceFAZolin (ANCEF) IVPB 2g/100 mL premix     2 g 200 mL/hr over 30 Minutes Intravenous Every 6 hours 05/11/19 1543 05/12/19 0500   05/11/19 1545  ceFAZolin (ANCEF) 2-4 GM/100ML-% IVPB    Note to Pharmacy: Maryan Puls  : cabinet override      05/11/19 1545 05/12/19 0359   05/11/19 1019  vancomycin (VANCOCIN) powder  Status:  Discontinued       As needed 05/11/19 1019 05/11/19 1204   05/11/19 0930  ceFAZolin (ANCEF) 3 g in dextrose 5 % 50 mL IVPB     3 g 100 mL/hr over 30 Minutes Intravenous To [facility] 05/08/19 1342 05/11/19 0957   05/11/19 0000   sulfamethoxazole-trimethoprim (BACTRIM DS) 800-160 MG tablet     1 tablet Oral 2 times daily 05/11/19 1132         Patient was given sequential compression devices, early ambulation, and chemoprophylaxis to prevent DVT.  Patient benefited maximally from hospital stay and there were no complications.    Recent vital signs:  Patient Vitals for the past 24 hrs:  BP Temp Temp src Pulse Resp SpO2  05/19/19 0805 (!) 127/95 98.3 F (36.8 C) Oral 74 18 98 %  05/19/19 0302 117/62 98 F (36.7 C) Oral 71 - 95 %  05/18/19 1943 112/66 - - 76 - 96 %  05/18/19 1459 118/60 97.7 F (36.5 C) Oral 80 17 96 %     Recent laboratory studies:  No results for input(s): WBC, HGB, HCT, PLT, NA, K, CL, CO2, BUN, CREATININE, GLUCOSE, INR, CALCIUM in the last 72 hours.  Invalid input(s): PT, 2   Discharge Medications:   Allergies as of 05/19/2019   No Known Allergies     Medication List    TAKE these medications   amLODipine 10 MG tablet Commonly known as: NORVASC Take 10 mg by mouth daily.   aspirin EC 81 MG tablet Take 1 tablet (81 mg total) by mouth 2 (two) times daily. What changed: when to take this   Belbuca 450 MCG Film Generic drug: Buprenorphine HCl Take 450 mcg by mouth 2 (two) times daily.   doxazosin 8 MG  tablet Commonly known as: CARDURA Take 8 mg by mouth daily.   DULoxetine 60 MG capsule Commonly known as: CYMBALTA Take 60 mg by mouth daily.   fluticasone 50 MCG/ACT nasal spray Commonly known as: FLONASE Place 1 spray into both nostrils as needed.   hydrochlorothiazide 25 MG tablet Commonly known as: HYDRODIURIL Take 25 mg by mouth daily.   latanoprost 0.005 % ophthalmic solution Commonly known as: XALATAN Place 1 drop into both eyes at bedtime.   losartan 100 MG tablet Commonly known as: COZAAR Take 100 mg by mouth daily.   methocarbamol 750 MG tablet Commonly known as: ROBAXIN Take 1 tablet (750 mg total) by mouth 2 (two) times daily as needed for muscle  spasms.   metoprolol tartrate 50 MG tablet Commonly known as: LOPRESSOR Take 50 mg by mouth 2 (two) times daily.   multivitamin with minerals tablet Take 1 tablet by mouth daily.   omega-3 acid ethyl esters 1 g capsule Commonly known as: LOVAZA Take 2 g by mouth 3 (three) times daily. 300 mg EPA/2mg -dha   omeprazole 20 MG capsule Commonly known as: PRILOSEC Take 20 mg by mouth daily.   ondansetron 4 MG tablet Commonly known as: ZOFRAN Take 1-2 tablets (4-8 mg total) by mouth every 8 (eight) hours as needed for nausea or vomiting.   oxyCODONE 10 mg 12 hr tablet Commonly known as: OXYCONTIN Take 10 mg by mouth 3 (three) times daily as needed (pain).   oxyCODONE-acetaminophen 5-325 MG tablet Commonly known as: Percocet Take 1-2 tablets by mouth every 8 (eight) hours as needed for severe pain.   phentermine 37.5 MG tablet Commonly known as: ADIPEX-P Take 37.5 mg by mouth daily.   potassium chloride SA 20 MEQ tablet Commonly known as: KLOR-CON Take 40-60 mEq by mouth daily. 60 meq in the morning and 40 meq in the evening   promethazine 25 MG tablet Commonly known as: PHENERGAN Take 1 tablet (25 mg total) by mouth every 6 (six) hours as needed for nausea.   Simbrinza 1-0.2 % Susp Generic drug: Brinzolamide-Brimonidine Place 1 drop into both eyes 3 (three) times daily.   sulfamethoxazole-trimethoprim 800-160 MG tablet Commonly known as: BACTRIM DS Take 1 tablet by mouth 2 (two) times daily.   timolol 0.5 % ophthalmic gel-forming Commonly known as: TIMOPTIC-XR Place 1 drop into both eyes at bedtime.   Trulicity 0.75 MG/0.5ML Sopn Generic drug: Dulaglutide Inject 0.75 mg into the skin once a week. Tuesday   Vitamin D 50 MCG (2000 UT) tablet Take 2,000 Units by mouth daily.            Durable Medical Equipment  (From admission, onward)         Start     Ordered   05/11/19 1613  DME Walker rolling  Once    Question:  Patient needs a walker to treat with  the following condition  Answer:  History of hip replacement   05/11/19 1612   05/11/19 1613  DME 3 n 1  Once     05/11/19 1612   05/11/19 1613  DME Bedside commode  Once    Question:  Patient needs a bedside commode to treat with the following condition  Answer:  History of hip replacement   05/11/19 1612          Diagnostic Studies: Dg Chest 2 View  Result Date: 05/07/2019 CLINICAL DATA:  Preoperative EXAM: CHEST - 2 VIEW COMPARISON:  None. FINDINGS: Cardiomegaly. Mild eventration of the right hemidiaphragm. 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 IMPRESSION: 1. No acute abnormality of the lungs. Mild eventration of the right hemidiaphragm. 2. cardiomegaly. Electronically Signed   By: Lauralyn Primes M.D.   On: 05/07/2019 14:25   Dg Pelvis Portable  Result Date: 05/11/2019 CLINICAL DATA:  Hip joint replacement status EXAM: PORTABLE PELVIS 1-2 VIEWS COMPARISON:  None. FINDINGS: Total RIGHT hip arthroplasty. No fracture dislocation. Degenerative change of the LEFT hip joint. IMPRESSION: RIGHT total hip arthroplasty. Electronically Signed   By: Genevive Bi M.D.   On: 05/11/2019 13:28   Dg C-arm 1-60 Min  Result Date: 05/11/2019 CLINICAL DATA:  Status post right total hip arthroplasty. EXAM: DG C-ARM 1-60 MIN CONTRAST:  None. FLUOROSCOPY TIME:  Fluoroscopy Time:  28 seconds. Number of Acquired Spot Images: 2. COMPARISON:  January 24, 2018. FINDINGS: Two intraoperative fluoroscopic images were obtained of the right hip. The right acetabular and femoral components appear to be well situated. IMPRESSION: Fluoroscopic guidance provided during right total hip arthroplasty. Electronically Signed   By: Lupita Raider M.D.   On: 05/11/2019 12:01   Dg Hip Operative Unilat W Or W/o Pelvis Right  Result Date: 05/11/2019 CLINICAL DATA:  Primary osteoarthritis of the right hip. EXAM: OPERATIVE RIGHT HIP (WITH PELVIS IF PERFORMED) 1 VIEW TECHNIQUE: Fluoroscopic spot image(s) were  submitted for interpretation post-operatively. COMPARISON:  RADIOGRAPHS DATED 01/24/2018 FINDINGS: Two ap C-arm images demonstrate that the acetabular and femoral components of the right total hip prosthesis appear in excellent position in the ap projection. No visible fractures. IMPRESSION: Satisfactory appearance of the right hip prosthesis. Electronically Signed   By: Francene Boyers M.D.   On: 05/11/2019 12:00    Disposition: Discharge disposition: 01-Home or Self Care          Contact information for follow-up providers    Tarry Kos, MD. Schedule an appointment as soon as possible for a visit in 2 week(s).   Specialty: Orthopedic Surgery Contact information: [address] [place] [address] [phone]            Contact information for after-discharge care    Destination    [facility]erred [facility] .   Service: Skilled Nursing Contact information: [address] [place] [address] [phone]                   Signed: Cristie Hem 05/19/2019, 8:49 AM

## 2019-05-13 NOTE — Progress Notes (Signed)
Subjective: 2 Days Post-Op Procedure(s) (LRB): RIGHT TOTAL HIP ARTHROPLASTY ANTERIOR APPROACH (Right) Patient reports pain as mild.    Objective: Vital signs in last 24 hours: Temp:  [97.9 F (36.6 C)-99.4 F (37.4 C)] 98.6 F (37 C) (11/11 0742) Pulse Rate:  [60-77] 72 (11/11 0742) Resp:  [17-18] 17 (11/11 0742) BP: (133-152)/(62-69) 133/66 (11/11 0742) SpO2:  [99 %-100 %] 100 % (11/11 0742)  Intake/Output from previous day: 11/10 0701 - 11/11 0700 In: 1030.7 [P.O.:600; I.V.:430.7] Out: 1300 [Urine:1300] Intake/Output this shift: No intake/output data recorded.  Recent Labs    05/12/19 0338  HGB 11.7*   Recent Labs    05/12/19 0338  WBC 10.5  RBC 4.08*  HCT 35.0*  PLT 110*   Recent Labs    05/12/19 0338  NA 136  K 2.8*  CL 101  CO2 27  BUN 15  CREATININE 1.13  GLUCOSE 123*  CALCIUM 8.9   No results for input(s): LABPT, INR in the last 72 hours.  Neurologically intact Neurovascular intact Sensation intact distally Intact pulses distally Dorsiflexion/Plantar flexion intact Incision: dressing C/D/I No cellulitis present Compartment soft   Assessment/Plan: 2 Days Post-Op Procedure(s) (LRB): RIGHT TOTAL HIP ARTHROPLASTY ANTERIOR APPROACH (Right) Advance diet Up with therapy D/C IV fluids Discharge to SNF once insurance approves and bed is available F/u with Dr. Erlinda Hong 2 weeks post-op      Aundra Dubin 05/13/2019, 9:21 AM

## 2019-05-13 NOTE — Progress Notes (Signed)
Physical Therapy Treatment Patient Details Name: Erik Black MRN: [id number] DOB: [birth year] Today's Date: 05/13/2019    History of Present Illness 68 y.o. male admitted on 05/11/19 for elective R THA direct anterior approach, WBAT post op.  Pt with significant PMH of HTN, glaucoma, DM, bil RTC repair, bil TKA, bil carpal tunnel release.      PT Comments    Pt needing MOD A to stand from low recliner.  Pt is motivated and should do well in short term SNF rehab.   Follow Up Recommendations  SNF;Follow surgeon's recommendation for DC plan and follow-up therapies     Equipment Recommendations  None recommended by PT    Recommendations for Other Services       Precautions / Restrictions Precautions Precautions: Fall Restrictions RLE Weight Bearing: Weight bearing as tolerated    Mobility  Bed Mobility         Supine to sit: Min assist     General bed mobility comments: up in recliner upon arrival  Transfers Overall transfer level: Needs assistance Equipment used: Rolling walker (2 wheeled) Transfers: Sit to/from Stand Sit to Stand: Mod assist         General transfer comment: MOD A to power up from low recliner with slow transition of hands  Ambulation/Gait Ambulation/Gait assistance: Min guard Gait Distance (Feet): 95 Feet Assistive device: Rolling walker (2 wheeled) Gait Pattern/deviations: Step-to pattern;Trunk flexed Gait velocity: decreased   General Gait Details: Flexed posture with slow cadence.    Stairs             Wheelchair Mobility    Modified Rankin (Stroke Patients Only)       Balance           Standing balance support: Bilateral upper extremity supported Standing balance-Leahy Scale: Poor Standing balance comment: reliant upon RW                            Cognition Arousal/Alertness: Awake/alert Behavior During Therapy: WFL for tasks assessed/performed Overall Cognitive Status: Within Functional  Limits for tasks assessed                                        Exercises      General Comments        Pertinent Vitals/Pain Pain Assessment: Faces Faces Pain Scale: Hurts a little bit Pain Location: R thigh Pain Descriptors / Indicators: Sore Pain Intervention(s): Monitored during session    Home Living Family/patient expects to be discharged to:: Skilled nursing facility Living Arrangements: Spouse/significant other Available Help at Discharge: Family;Available 24 hours/day         Home Equipment: Walker - 2 wheels;Cane - single point;Grab bars - toilet;Grab bars - tub/shower;Tub bench Additional Comments: pt doesn't feel secure on tub bench:  sponge bathes    Prior Function        Comments: had assist for socks and shoes prior to admission. Pt has a reacher and long sponge at home   PT Goals (current goals can now be found in the care plan section) Acute Rehab PT Goals Patient Stated Goal: to not be a burden on his wife PT Goal Formulation: With patient Time For Goal Achievement: 05/26/19 Potential to Achieve Goals: Good Progress towards PT goals: Progressing toward goals    Frequency    7X/week      PT  Plan Current plan remains appropriate    Co-evaluation              AM-PAC PT "6 Clicks" Mobility   Outcome Measure  Help needed turning from your back to your side while in a flat bed without using bedrails?: A Little Help needed moving from lying on your back to sitting on the side of a flat bed without using bedrails?: A Little Help needed moving to and from a bed to a chair (including a wheelchair)?: A Little Help needed standing up from a chair using your arms (e.g., wheelchair or bedside chair)?: A Little Help needed to walk in hospital room?: A Little Help needed climbing 3-5 steps with a railing? : A Lot 6 Click Score: 17    End of Session Equipment Utilized During Treatment: Gait belt Activity Tolerance: Patient  tolerated treatment well Patient left: in chair;with call bell/phone within reach   PT Visit Diagnosis: Muscle weakness (generalized) (M62.81);Difficulty in walking, not elsewhere classified (R26.2);Pain Pain - Right/Left: Right Pain - part of body: Hip     Time: 4010-2725 PT Time Calculation (min) (ACUTE ONLY): 20 min  Charges:  $Gait Training: 8-22 mins                     Erik Black L. [name], [place] Pager [phone] 05/13/2019    [name] 05/13/2019, 12:11 PM

## 2019-05-13 NOTE — Progress Notes (Signed)
Physical Therapy Treatment Patient Details Name: Erik Black MRN: [id number] DOB: 06/21/1951 Today's Date: 05/13/2019    History of Present Illness 68 y.o. male admitted on 05/11/19 for elective R THA direct anterior approach, WBAT post op.  Pt with significant PMH of HTN, glaucoma, DM, bil RTC repair, bil TKA, bil carpal tunnel release.      PT Comments    Pt with improved stand compared to earlier.  Improvement in bed mobility. Con't to recommend SNF and anticipate that he should do well there.   Follow Up Recommendations  SNF;Follow surgeon's recommendation for DC plan and follow-up therapies     Equipment Recommendations  None recommended by PT    Recommendations for Other Services       Precautions / Restrictions Precautions Precautions: Fall Restrictions RLE Weight Bearing: Weight bearing as tolerated    Mobility  Bed Mobility           Sit to supine: Min guard   General bed mobility comments: Pt instructed how to use L LE to lift R LE and was able to perform to get legs on bed without physical A from PT.  Transfers Overall transfer level: Needs assistance Equipment used: Rolling walker (2 wheeled) Transfers: Sit to/from [facility] Sit to Stand: Min assist Stand pivot transfers: Min guard       General transfer comment: Smoother stand than earlier from low recliner.  Ambulation/Gait       Stairs             Wheelchair Mobility    Modified Rankin (Stroke Patients Only)       Balance           Standing balance support: Bilateral upper extremity supported Standing balance-Leahy Scale: Poor Standing balance comment: reliant upon RW                            Cognition Arousal/Alertness: Awake/alert Behavior During Therapy: WFL for tasks assessed/performed Overall Cognitive Status: Within Functional Limits for tasks assessed                                        Exercises       General Comments General comments (skin integrity, edema, etc.): Pt stated he had been doing exercises and didn't want to do with PT, but requested to get back to bed.      Pertinent Vitals/Pain Pain Assessment: Faces Faces Pain Scale: Hurts a little bit Pain Location: R thigh Pain Descriptors / Indicators: Sore Pain Intervention(s): Limited activity within patient's tolerance;Monitored during session;Repositioned    Home Living                      Prior Function            PT Goals (current goals can now be found in the care plan section) Acute Rehab PT Goals PT Goal Formulation: With patient Time For Goal Achievement: 05/26/19 Potential to Achieve Goals: Good Progress towards PT goals: Progressing toward goals    Frequency    7X/week      PT Plan Current plan remains appropriate    Co-evaluation              AM-PAC PT "6 Clicks" Mobility   Outcome Measure  Help needed turning from your back to your side while in a flat  bed without using bedrails?: A Little Help needed moving from lying on your back to sitting on the side of a flat bed without using bedrails?: A Little Help needed moving to and from a bed to a chair (including a wheelchair)?: A Little Help needed standing up from a chair using your arms (e.g., wheelchair or bedside chair)?: A Little Help needed to walk in hospital room?: A Little Help needed climbing 3-5 steps with a railing? : A Lot 6 Click Score: 17    End of Session Equipment Utilized During Treatment: Gait belt Activity Tolerance: Patient tolerated treatment well Patient left: in bed;with call bell/phone within reach   PT Visit Diagnosis: Muscle weakness (generalized) (M62.81);Difficulty in walking, not elsewhere classified (R26.2);Pain Pain - Right/Left: Right Pain - part of body: Hip     Time: 1351-1413 PT Time Calculation (min) (ACUTE ONLY): 22 min  Charges:   $Therapeutic Activity: 8-22 mins                      Keyle Doby L. [name], [place] Pager [phone] 05/13/2019    [name] 05/13/2019, 2:45 PM

## 2019-05-13 NOTE — TOC Progression Note (Signed)
Transition of Care [facility]) - Progression Note    Patient Details  Name: Erik Black MRN: [id number] Date of Birth: October 03, 1950  Transition of Care [facility]) CM/SW [place], LCSW Phone Number: 05/13/2019, 3:49 PM  Clinical Narrative:    CSW met with patient and his wife Erik Black at the bedside to discuss barriers to discharge. CSW explained [facility] SN[facility] process and the time frame patient should expect. Patient now stating that he would like to use his [facility]. Information sent to facilities within the [place] area. Awaiting bed offers. Will prior authorization and COVID test within 48 hours of discharge.    Expected Discharge Plan: [place] Barriers to Discharge: No SNF bed, Continued Medical Work up  Expected Discharge Plan and Services Expected Discharge Plan: [place] In-house Referral: Clinical Social Work   Post Acute Care Choice: [place] arrangements for the past 2 months: Single Family Home Expected Discharge Date: 05/13/19                                     Social Determinants of Health (SDOH) Interventions    Readmission Risk Interventions No flowsheet data found.

## 2019-05-13 NOTE — Evaluation (Signed)
Occupational Therapy Evaluation Patient Details Name: Erik Black MRN: [id number] DOB: Nov 25, 1950 Today's Date: 05/13/2019    History of Present Illness 68 y.o. male admitted on 05/11/19 for elective R THA direct anterior approach, WBAT post op.  Pt with significant PMH of HTN, glaucoma, DM, bil RTC repair, bil TKA, bil carpal tunnel release.     Clinical Impression   Pt was admitted for the above.  At baseline, he was mod I with RW and needed assistance for socks and shoes.  He sponge bathed as he didn't feel comfortable with tub/tub bench.  Pt has difficulty lifting and advancing RLE for adls.  Leaning forward is uncomfortable. Educated on AE for adls, and he practiced with reacher and wide sock aide today. Pt needs min to mod A for LB adls with AE and min A for transfers. Goals in acute are for min guard levels.    Follow Up Recommendations  Follow surgeon's recommendation for DC plan and follow-up therapies;SNF    Equipment Recommendations  (would benefit from 3:1, may need wide model)    Recommendations for Other Services       Precautions / Restrictions Precautions Precautions: Fall Restrictions RLE Weight Bearing: Weight bearing as tolerated      Mobility Bed Mobility         Supine to sit: Min assist     General bed mobility comments: light min A for RLE  Transfers   Equipment used: Rolling walker (2 wheeled)   Sit to Stand: Min assist;From elevated surface         General transfer comment: assist to rise and stabilize    Balance             Standing balance-Leahy Scale: Poor Standing balance comment: reliant upon RW                           ADL either performed or assessed with clinical judgement   ADL Overall ADL's : Needs assistance/impaired Eating/Feeding: Independent   Grooming: Set up   Upper Body Bathing: Set up   Lower Body Bathing: Minimal assistance;Sit to/from stand;With caregiver independent assisting    Upper Body Dressing : Set up   Lower Body Dressing: Moderate assistance;Sit to/from stand;With adaptive equipment   Toilet Transfer: Minimal assistance;Ambulation;RW(3 feet to recliner)   Toileting- Clothing Manipulation and Hygiene: Minimal assistance         General ADL Comments: pt can perform UB adls wtih set up. Educated on reacher for pants:  has difficulty advancing and lifting RLE.  Also used wide and regular sock aides with mod A--standard model is too narrow     Vision         Perception     Praxis      Pertinent Vitals/Pain Pain Assessment: Faces Faces Pain Scale: Hurts a little bit Pain Location: R thigh Pain Descriptors / Indicators: Sore Pain Intervention(s): Limited activity within patient's tolerance;Monitored during session;Premedicated before session;Repositioned     Hand Dominance Right   Extremity/Trunk Assessment Upper Extremity Assessment Upper Extremity Assessment: Overall WFL for tasks assessed           Communication Communication Communication: No difficulties   Cognition Arousal/Alertness: Awake/alert Behavior During Therapy: WFL for tasks assessed/performed Overall Cognitive Status: Within Functional Limits for tasks assessed  General Comments       Exercises     Shoulder Instructions      Home Living Family/patient expects to be discharged to:: Skilled nursing facility Living Arrangements: Spouse/significant other Available Help at Discharge: Family;Available 24 hours/day               Bathroom Shower/Tub: Tub/shower unit(sponge bathes)   Bathroom Toilet: Handicapped height     Home Equipment: Environmental consultant - 2 wheels;Cane - single point;Grab bars - toilet;Grab bars - tub/shower;Tub bench   Additional Comments: pt doesn't feel secure on tub bench:  sponge bathes      Prior Functioning/Environment          Comments: had assist for socks and shoes prior to  admission. Pt has a reacher and long sponge at home        OT Problem List: Decreased strength;Decreased activity tolerance;Impaired balance (sitting and/or standing);Decreased knowledge of use of DME or AE;Pain      OT Treatment/Interventions: Self-care/ADL training;DME and/or AE instruction;Patient/family education;Balance training;Therapeutic activities    OT Goals(Current goals can be found in the care plan section) Acute Rehab OT Goals Patient Stated Goal: to not be a burden on his wife OT Goal Formulation: With patient Time For Goal Achievement: 05/20/19 Potential to Achieve Goals: Good ADL Goals Pt Will Perform Grooming: with supervision;standing Pt Will Perform Lower Body Bathing: with min guard assist;sit to/from stand;with adaptive equipment Pt Will Perform Lower Body Dressing: with min guard assist;with adaptive equipment;sit to/from stand Pt Will Transfer to Toilet: with min guard assist;ambulating;bedside commode Pt Will Perform Toileting - Clothing Manipulation and hygiene: with min guard assist;sit to/from stand  OT Frequency: Min 2X/week   Barriers to D/C:            Co-evaluation              AM-PAC OT "6 Clicks" Daily Activity     Outcome Measure Help from another person eating meals?: None Help from another person taking care of personal grooming?: A Little Help from another person toileting, which includes using toliet, bedpan, or urinal?: A Little Help from another person bathing (including washing, rinsing, drying)?: A Little Help from another person to put on and taking off regular upper body clothing?: A Little Help from another person to put on and taking off regular lower body clothing?: A Lot 6 Click Score: 18   End of Session    Activity Tolerance: Patient tolerated treatment well Patient left: in chair;with call bell/phone within reach  OT Visit Diagnosis: Pain Pain - Right/Left: Right Pain - part of body: Leg                Time:  0086-7619 OT Time Calculation (min): 37 min Charges:  OT General Charges $OT Visit: 1 Visit OT Evaluation $OT Eval Low Complexity: 1 Low OT Treatments $Self Care/Home Management : 8-22 mins  Erik Black, OTR/L Acute Rehabilitation Services [phone] [facility] pager [phone] office 05/13/2019  Erik Black 05/13/2019, 10:34 AM

## 2019-05-14 LAB — GLUCOSE, CAPILLARY
Glucose-Capillary: 88 mg/dL (ref 70–99)
Glucose-Capillary: 110 mg/dL — ABNORMAL HIGH (ref 70–99)
Glucose-Capillary: 99 mg/dL (ref 70–99)
Glucose-Capillary: 170 mg/dL — ABNORMAL HIGH (ref 70–99)

## 2019-05-14 LAB — SARS CORONAVIRUS 2 (TAT 6-24 HRS): SARS Coronavirus 2: NEGATIVE

## 2019-05-14 NOTE — Progress Notes (Signed)
Physical Therapy Treatment Patient Details Name: Erik Black MRN: [id number] DOB: 03/11/1951 Today's Date: 05/14/2019    History of Present Illness 68 y.o. male admitted on 05/11/19 for elective R THA direct anterior approach, WBAT post op.  Pt with significant PMH of HTN, glaucoma, DM, bil RTC repair, bil TKA, bil carpal tunnel release.      PT Comments    Pt OOB in recliner upon PT arrival, agreeable to PT session. Pt able to recall and demo multiple seated exercises with minimal VCs for technique. Pt reports no changes in pain, educated on importance of performing between PT sessions. Pt was able to ambulate with supervision and RW in hallway, but reports more fatigue earlier this afternoon due to exercises. The patient completed a 5xsit-to-stand (5XSTS) in 82s with RW and min guard. This is above the age-based cut-off of 11.4s and therefore indicates they are at increased risk for falls. The patient will therefore continue to benefit from skilled PT to address limitations in functional strength, mobility, and balance.    Follow Up Recommendations  SNF;Follow surgeon's recommendation for DC plan and follow-up therapies     Equipment Recommendations  None recommended by PT    Recommendations for Other Services       Precautions / Restrictions Precautions Precautions: Fall;Anterior Hip Precaution Booklet Issued: Yes (comment) Restrictions Weight Bearing Restrictions: Yes RLE Weight Bearing: Weight bearing as tolerated    Mobility  Bed Mobility Overal bed mobility: Needs Assistance Bed Mobility: Sit to Supine       Sit to supine: Min assist;Min guard   General bed mobility comments: Pt able to move into bed from sitting EOB without assist but had increased discomfort at surgical site and would benefit from minA to reposition RLE. Pt able to scoot and reposition using BUE without assist.  Transfers Overall transfer level: Needs assistance Equipment used: Rolling  walker (2 wheeled) Transfers: Sit to/from Stand Sit to Stand: Min assist;Min guard         General transfer comment: Pt requires minA due to post lean/pull on RW when attempting to stand. Able to complete multiple sit-stand transfers with progression to min guard with RW.  Ambulation/Gait Ambulation/Gait assistance: Supervision Gait Distance (Feet): 75 Feet Assistive device: Rolling walker (2 wheeled) Gait Pattern/deviations: Trunk flexed;Step-through pattern;Decreased stance time - right;Decreased weight shift to right Gait velocity: 0.2 m/s Gait velocity interpretation: <1.31 ft/sec, indicative of household ambulator General Gait Details: Flexed posture with slow cadence.    Stairs             Wheelchair Mobility    Modified Rankin (Stroke Patients Only)       Balance Overall balance assessment: Needs assistance Sitting-balance support: Feet supported;No upper extremity supported Sitting balance-Leahy Scale: Good Sitting balance - Comments: static sitting supervision   Standing balance support: Bilateral upper extremity supported;During functional activity Standing balance-Leahy Scale: Poor Standing balance comment: reliant upon RW                            Cognition Arousal/Alertness: Awake/alert Behavior During Therapy: WFL for tasks assessed/performed Overall Cognitive Status: Within Functional Limits for tasks assessed                                        Exercises Total Joint Exercises Ankle Circles/Pumps: AROM;Both;20 reps Long Arc Quad: AROM;10 reps;Both;Seated Marching in Standing:  AROM;10 reps;Standing;Both Other Exercises Other Exercises: Seated Calf Raises - 2 x 10 bilateral    General Comments        Pertinent Vitals/Pain Pain Assessment: 0-10 Pain Score: 0-No pain(pt reports no "pain" only "burning") Pain Location: R thigh Pain Descriptors / Indicators: Burning;Sore Pain Intervention(s): Limited activity  within patient's tolerance;Monitored during session;Repositioned    Home Living                      Prior Function            PT Goals (current goals can now be found in the care plan section) Acute Rehab PT Goals Patient Stated Goal: to not be a burden on his wife PT Goal Formulation: With patient Time For Goal Achievement: 05/26/19 Potential to Achieve Goals: Good Progress towards PT goals: Progressing toward goals    Frequency    7X/week      PT Plan Current plan remains appropriate    Co-evaluation              AM-PAC PT "6 Clicks" Mobility   Outcome Measure  Help needed turning from your back to your side while in a flat bed without using bedrails?: None Help needed moving from lying on your back to sitting on the side of a flat bed without using bedrails?: A Little Help needed moving to and from a bed to a chair (including a wheelchair)?: A Little Help needed standing up from a chair using your arms (e.g., wheelchair or bedside chair)?: A Little Help needed to walk in hospital room?: A Little Help needed climbing 3-5 steps with a railing? : A Lot 6 Click Score: 18    End of Session Equipment Utilized During Treatment: Gait belt Activity Tolerance: Patient tolerated treatment well Patient left: with call bell/phone within reach;in chair Nurse Communication: Mobility status PT Visit Diagnosis: Muscle weakness (generalized) (M62.81);Difficulty in walking, not elsewhere classified (R26.2);Pain Pain - Right/Left: Right Pain - part of body: Hip     Time: 4098-1191 PT Time Calculation (min) (ACUTE ONLY): 29 min  Charges:  $Gait Training: 8-22 mins $Therapeutic Exercise: 8-22 mins                     Metro Kung, PT, DPT   Acute Rehabilitation Department [phone]   Gaetana Michaelis 05/14/2019, 3:04 PM

## 2019-05-14 NOTE — Progress Notes (Signed)
Subjective: 3 Days Post-Op Procedure(s) (LRB): RIGHT TOTAL HIP ARTHROPLASTY ANTERIOR APPROACH (Right) Patient reports pain as mild.    Objective: Vital signs in last 24 hours: Temp:  [97.9 F (36.6 C)-98.5 F (36.9 C)] 97.9 F (36.6 C) (11/12 0747) Pulse Rate:  [59-76] 69 (11/12 0747) Resp:  [16-18] 18 (11/12 0747) BP: (113-138)/(56-79) 138/73 (11/12 0747) SpO2:  [96 %-100 %] 99 % (11/12 0747)  Intake/Output from previous day: 11/11 0701 - 11/12 0700 In: 1080 [P.O.:1080] Out: 1225 [Urine:1225] Intake/Output this shift: No intake/output data recorded.  Recent Labs    05/12/19 0338  HGB 11.7*   Recent Labs    05/12/19 0338  WBC 10.5  RBC 4.08*  HCT 35.0*  PLT 110*   Recent Labs    05/12/19 0338  NA 136  K 2.8*  CL 101  CO2 27  BUN 15  CREATININE 1.13  GLUCOSE 123*  CALCIUM 8.9   No results for input(s): LABPT, INR in the last 72 hours.  Neurologically intact Neurovascular intact Sensation intact distally Intact pulses distally Dorsiflexion/Plantar flexion intact Incision: dressing C/D/I No cellulitis present Compartment soft   Assessment/Plan: 3 Days Post-Op Procedure(s) (LRB): RIGHT TOTAL HIP ARTHROPLASTY ANTERIOR APPROACH (Right) Advance diet Up with therapy Discharge to SNF once bed available F/u with Dr. Erlinda Hong 2 weeks post-op      Aundra Dubin 05/14/2019, 7:49 AM

## 2019-05-14 NOTE — Progress Notes (Signed)
Physical Therapy Treatment Patient Details Name: Erik Black MRN: [id number] DOB: 28-Jun-1951 Today's Date: 05/14/2019    History of Present Illness 68 y.o. male admitted on 05/11/19 for elective R THA direct anterior approach, WBAT post op.  Pt with significant PMH of HTN, glaucoma, DM, bil RTC repair, bil TKA, bil carpal tunnel release.      PT Comments    Pt in bed upon PT arrival, agreeable to PT session. Pt demos good bed mobility despite reliance on bed rails and elevated HOB, pt demos good UE strength and use of BUE to position sitting EOB without PT assist. Pt demos slight difficulty with sit-stand as he has slight post lean that requires minA on RW for safety. Pt was then able to demo good ambulation, but patient ambulates with a gait speed of 0.42m/s using RW and with supervision. A gait speed less than 0.35m/s indicates increased risk of falls and dependence in ADLs, and therefore the patient will continue to benefit from skilled PT to address limitations in functional endurance and mobility.    Follow Up Recommendations  SNF;Follow surgeon's recommendation for DC plan and follow-up therapies     Equipment Recommendations  None recommended by PT    Recommendations for Other Services       Precautions / Restrictions Precautions Precautions: Fall Restrictions Weight Bearing Restrictions: Yes RLE Weight Bearing: Weight bearing as tolerated    Mobility  Bed Mobility Overal bed mobility: Needs Assistance Bed Mobility: Supine to Sit     Supine to sit: Min guard;HOB elevated     General bed mobility comments: Pt able to move to sitting EOB without physical assist, but heavy reliance on bed rails and elevated HOB.  Transfers Overall transfer level: Needs assistance Equipment used: Rolling walker (2 wheeled) Transfers: Sit to/from Stand Sit to Stand: Min assist Stand pivot transfers: From elevated surface       General transfer comment: Pt requires minA due to  post lean/pull on RW when attempting to stand. No physical assist on pt  Ambulation/Gait Ambulation/Gait assistance: Supervision Gait Distance (Feet): 100 Feet Assistive device: Rolling walker (2 wheeled) Gait Pattern/deviations: Trunk flexed;Step-through pattern;Decreased stance time - right;Decreased weight shift to right Gait velocity: 0.2 m/s Gait velocity interpretation: <1.31 ft/sec, indicative of household ambulator General Gait Details: Flexed posture with slow cadence.    Stairs             Wheelchair Mobility    Modified Rankin (Stroke Patients Only)       Balance Overall balance assessment: Needs assistance Sitting-balance support: Feet supported;No upper extremity supported Sitting balance-Leahy Scale: Good Sitting balance - Comments: static sitting supervision   Standing balance support: Bilateral upper extremity supported Standing balance-Leahy Scale: Poor Standing balance comment: reliant upon RW                            Cognition Arousal/Alertness: Awake/alert Behavior During Therapy: WFL for tasks assessed/performed Overall Cognitive Status: Within Functional Limits for tasks assessed                                        Exercises      General Comments        Pertinent Vitals/Pain Pain Assessment: Faces Faces Pain Scale: Hurts a little bit Pain Location: R thigh Pain Descriptors / Indicators: Burning;Sore Pain Intervention(s): Limited activity within patient's  tolerance;Monitored during session;Repositioned    Home Living                      Prior Function            PT Goals (current goals can now be found in the care plan section) Acute Rehab PT Goals Patient Stated Goal: to not be a burden on his wife PT Goal Formulation: With patient Time For Goal Achievement: 05/26/19 Potential to Achieve Goals: Good    Frequency    7X/week      PT Plan Current plan remains appropriate     Co-evaluation              AM-PAC PT "6 Clicks" Mobility   Outcome Measure  Help needed turning from your back to your side while in a flat bed without using bedrails?: A Little Help needed moving from lying on your back to sitting on the side of a flat bed without using bedrails?: A Little Help needed moving to and from a bed to a chair (including a wheelchair)?: A Little Help needed standing up from a chair using your arms (e.g., wheelchair or bedside chair)?: A Little Help needed to walk in hospital room?: A Little Help needed climbing 3-5 steps with a railing? : A Lot 6 Click Score: 17    End of Session Equipment Utilized During Treatment: Gait belt Activity Tolerance: Patient tolerated treatment well Patient left: with call bell/phone within reach;in chair Nurse Communication: Mobility status PT Visit Diagnosis: Muscle weakness (generalized) (M62.81);Difficulty in walking, not elsewhere classified (R26.2);Pain Pain - Right/Left: Right Pain - part of body: Hip     Time: 4765-4650 PT Time Calculation (min) (ACUTE ONLY): 23 min  Charges:  $Gait Training: 23-37 mins                     Mickey Farber, PT, DPT   Acute Rehabilitation Department [phone]   Otho Bellows 05/14/2019, 9:39 AM

## 2019-05-15 LAB — GLUCOSE, CAPILLARY
Glucose-Capillary: 81 mg/dL (ref 70–99)
Glucose-Capillary: 100 mg/dL — ABNORMAL HIGH (ref 70–99)
Glucose-Capillary: 105 mg/dL — ABNORMAL HIGH (ref 70–99)

## 2019-05-15 NOTE — Progress Notes (Signed)
Insurance authorization pending for SNF placement with [employer] TOC team will continue to monitor... Whitman Hero RN,BSN,CM

## 2019-05-15 NOTE — Progress Notes (Signed)
Physical Therapy Treatment Patient Details Name: Erik Black MRN: [id number] DOB: [birth year] Today's Date: 05/15/2019    History of Present Illness 68 y.o. male admitted on 05/11/19 for elective R THA direct anterior approach, WBAT post op.  Pt with significant PMH of HTN, glaucoma, DM, bil RTC repair, bil TKA, bil carpal tunnel release.      PT Comments    Pt OOB in recliner with spouse present, agreeable to PT session. Pt reports he is still in no pain, and is able to demo good transfers (even from low recliner) with min guard and use of RW. Pt requires sig less cues for hand placement, and is able to ambulate with supervision and RW. Pt and spouse educated on HEP, car transfers, and ambulation, pt and wife verbalize understanding. All questions answered. Pt will continue to benefit from skilled PT to maximize rehab and mobility to prior level of function.     Follow Up Recommendations  SNF;Follow surgeon's recommendation for DC plan and follow-up therapies     Equipment Recommendations  None recommended by PT    Recommendations for Other Services       Precautions / Restrictions Precautions Precautions: Fall;Anterior Hip Precaution Booklet Issued: Yes (comment) Restrictions Weight Bearing Restrictions: Yes RLE Weight Bearing: Weight bearing as tolerated    Mobility  Bed Mobility Overal bed mobility: Needs Assistance Bed Mobility: Sit to Supine     Supine to sit: Supervision;HOB elevated Sit to supine: Supervision   General bed mobility comments: Pt able to move from sitting EOB without assist, able to reposition and scoot in bed without assist  Transfers Overall transfer level: Needs assistance Equipment used: Rolling walker (2 wheeled) Transfers: Sit to/from Stand Sit to Stand: Min guard Stand pivot transfers: Min guard       General transfer comment: Pt able to complete without assist, min guard for safety. Pt benefits from elevated surface due to his  height, but is able to rise from recliner  Ambulation/Gait Ambulation/Gait assistance: Supervision Gait Distance (Feet): 150 Feet Assistive device: Rolling walker (2 wheeled) Gait Pattern/deviations: Trunk flexed;Step-through pattern;Decreased stance time - right;Decreased weight shift to right Gait velocity: 0.29 m/s Gait velocity interpretation: <1.31 ft/sec, indicative of household ambulator General Gait Details: Flexed posture with slow cadence.    Stairs             Wheelchair Mobility    Modified Rankin (Stroke Patients Only)       Balance Overall balance assessment: Needs assistance Sitting-balance support: Feet supported;No upper extremity supported Sitting balance-Leahy Scale: Good     Standing balance support: Bilateral upper extremity supported;During functional activity Standing balance-Leahy Scale: Fair Standing balance comment: able to stand stationary with single UE support, prefers BUE                            Cognition Arousal/Alertness: Awake/alert Behavior During Therapy: WFL for tasks assessed/performed Overall Cognitive Status: Within Functional Limits for tasks assessed                                        Exercises Total Joint Exercises Heel Slides: AAROM;10 reps;Both;Supine Long Arc Quad: AROM;10 reps;Both;Seated Marching in Standing: AROM;10 reps;Standing;Both Other Exercises Other Exercises: Seated Calf Raises - 2 x 10 bilateral    General Comments        Pertinent Vitals/Pain Pain Assessment: No/denies pain  Home Living                      Prior Function            PT Goals (current goals can now be found in the care plan section) Acute Rehab PT Goals Patient Stated Goal: to not be a burden on his wife PT Goal Formulation: With patient Time For Goal Achievement: 05/26/19 Potential to Achieve Goals: Good Progress towards PT goals: Progressing toward goals    Frequency     7X/week      PT Plan Current plan remains appropriate    Co-evaluation              AM-PAC PT "6 Clicks" Mobility   Outcome Measure  Help needed turning from your back to your side while in a flat bed without using bedrails?: None Help needed moving from lying on your back to sitting on the side of a flat bed without using bedrails?: A Little Help needed moving to and from a bed to a chair (including a wheelchair)?: A Little Help needed standing up from a chair using your arms (e.g., wheelchair or bedside chair)?: A Little Help needed to walk in hospital room?: A Little Help needed climbing 3-5 steps with a railing? : A Little 6 Click Score: 19    End of Session Equipment Utilized During Treatment: Gait belt Activity Tolerance: Patient tolerated treatment well Patient left: in bed;with call bell/phone within reach;with family/visitor present Nurse Communication: Mobility status PT Visit Diagnosis: Muscle weakness (generalized) (M62.81);Difficulty in walking, not elsewhere classified (R26.2);Pain Pain - Right/Left: Right Pain - part of body: Hip     Time: 0623-7628 PT Time Calculation (min) (ACUTE ONLY): 31 min  Charges:  $Gait Training: 8-22 mins $Therapeutic Exercise: 8-22 mins                     Mickey Farber, PT, DPT   Acute Rehabilitation Department [phone]   Otho Bellows 05/15/2019, 1:11 PM

## 2019-05-15 NOTE — Progress Notes (Signed)
Subjective: 4 Days Post-Op Procedure(s) (LRB): RIGHT TOTAL HIP ARTHROPLASTY ANTERIOR APPROACH (Right) Patient reports pain as mild.    Objective: Vital signs in last 24 hours: Temp:  [98.3 F (36.8 C)-99.1 F (37.3 C)] 99.1 F (37.3 C) (11/13 0415) Pulse Rate:  [66-78] 68 (11/13 0415) Resp:  [17-18] 18 (11/13 0415) BP: (128-139)/(72-88) 134/72 (11/13 0415) SpO2:  [100 %] 100 % (11/13 0415)  Intake/Output from previous day: 11/12 0701 - 11/13 0700 In: -  Out: 1350 [Urine:1350] Intake/Output this shift: No intake/output data recorded.  No results for input(s): HGB in the last 72 hours. No results for input(s): WBC, RBC, HCT, PLT in the last 72 hours. No results for input(s): NA, K, CL, CO2, BUN, CREATININE, GLUCOSE, CALCIUM in the last 72 hours. No results for input(s): LABPT, INR in the last 72 hours.  Neurologically intact Neurovascular intact Sensation intact distally Intact pulses distally Dorsiflexion/Plantar flexion intact Incision: dressing C/D/I No cellulitis present Compartment soft   Assessment/Plan: 4 Days Post-Op Procedure(s) (LRB): RIGHT TOTAL HIP ARTHROPLASTY ANTERIOR APPROACH (Right) Up with therapy  wbat RLE D/c to SNF once insurance approves and bed available F/u with Dr. Erlinda Hong 2 weeks post-op      Aundra Dubin 05/15/2019, 7:56 AM

## 2019-05-15 NOTE — Plan of Care (Signed)
  Problem: Clinical Measurements: Goal: Ability to maintain clinical measurements within normal limits will improve Outcome: Progressing   Problem: Activity: Goal: Risk for activity intolerance will decrease Outcome: Progressing   Problem: Nutrition: Goal: Adequate nutrition will be maintained Outcome: Progressing   Problem: Pain Managment: Goal: General experience of comfort will improve Outcome: Progressing   Problem: Safety: Goal: Ability to remain free from injury will improve Outcome: Progressing   Problem: Skin Integrity: Goal: Risk for impaired skin integrity will decrease Outcome: Progressing   

## 2019-05-15 NOTE — Progress Notes (Signed)
Physical Therapy Treatment Patient Details Name: Erik Black MRN: [id number] DOB: [birth year] Today's Date: 05/15/2019    History of Present Illness 68 y.o. male admitted on 05/11/19 for elective R THA direct anterior approach, WBAT post op.  Pt with significant PMH of HTN, glaucoma, DM, bil RTC repair, bil TKA, bil carpal tunnel release.      PT Comments    Pt in bed upon PT arrival, agreeable to PT session. Pt was able to demo improved independence with bed mobility, but still relies heavily on bed rails to come to sitting EOB. Pt able to progress ambulation today with no reports of change in pain or discomfort. Pt was educated in multiple LE exercises and pt was able to demo with good technique. The patient ambulates with a gait speed of 0.4m/s using RW and with supervision. A gait speed less than 0.47m/s indicates increased risk of falls and dependence in ADLs, and therefore the patient will continue to benefit from skilled PT to address limitations in functional endurance and mobility.    Follow Up Recommendations  SNF;Follow surgeon's recommendation for DC plan and follow-up therapies     Equipment Recommendations  None recommended by PT    Recommendations for Other Services       Precautions / Restrictions Precautions Precautions: Fall;Anterior Hip Precaution Booklet Issued: Yes (comment) Restrictions Weight Bearing Restrictions: Yes RLE Weight Bearing: Weight bearing as tolerated    Mobility  Bed Mobility Overal bed mobility: Needs Assistance Bed Mobility: Sit to Supine     Supine to sit: Supervision;HOB elevated     General bed mobility comments: Pt able to move to sitting EOB without assist, uses elevated HOB and rails  Transfers Overall transfer level: Needs assistance Equipment used: Rolling walker (2 wheeled) Transfers: Sit to/from Stand   Stand pivot transfers: Min guard       General transfer comment: Pt able to complete without assist, min  guard for safety. Pt benefits from elevated surface due to his height, but is able to rise from recliner  Ambulation/Gait Ambulation/Gait assistance: Supervision Gait Distance (Feet): 150 Feet Assistive device: Rolling walker (2 wheeled) Gait Pattern/deviations: Trunk flexed;Step-through pattern;Decreased stance time - right;Decreased weight shift to right Gait velocity: 0.29 m/s Gait velocity interpretation: <1.31 ft/sec, indicative of household ambulator General Gait Details: Flexed posture with slow cadence.    Stairs             Wheelchair Mobility    Modified Rankin (Stroke Patients Only)       Balance Overall balance assessment: Needs assistance Sitting-balance support: Feet supported;No upper extremity supported Sitting balance-Leahy Scale: Good     Standing balance support: Bilateral upper extremity supported;During functional activity Standing balance-Leahy Scale: Fair Standing balance comment: able to stand stationary with single UE support, prefers BUE                            Cognition Arousal/Alertness: Awake/alert Behavior During Therapy: WFL for tasks assessed/performed Overall Cognitive Status: Within Functional Limits for tasks assessed                                        Exercises Total Joint Exercises Heel Slides: AAROM;10 reps;Both Long Arc Quad: AROM;10 reps;Both;Seated(2 x 10) Marching in Standing: AROM;10 reps;Standing;Both Other Exercises Other Exercises: Seated Calf Raises - 2 x 10 bilateral    General Comments  Pertinent Vitals/Pain Pain Assessment: No/denies pain    Home Living                      Prior Function            PT Goals (current goals can now be found in the care plan section) Acute Rehab PT Goals Patient Stated Goal: to not be a burden on his wife PT Goal Formulation: With patient Time For Goal Achievement: 05/26/19 Potential to Achieve Goals: Good Progress  towards PT goals: Progressing toward goals    Frequency    7X/week      PT Plan Current plan remains appropriate    Co-evaluation              AM-PAC PT "6 Clicks" Mobility   Outcome Measure  Help needed turning from your back to your side while in a flat bed without using bedrails?: None Help needed moving from lying on your back to sitting on the side of a flat bed without using bedrails?: A Little Help needed moving to and from a bed to a chair (including a wheelchair)?: A Little Help needed standing up from a chair using your arms (e.g., wheelchair or bedside chair)?: A Little Help needed to walk in hospital room?: A Little Help needed climbing 3-5 steps with a railing? : A Little 6 Click Score: 19    End of Session Equipment Utilized During Treatment: Gait belt Activity Tolerance: Patient tolerated treatment well Patient left: with call bell/phone within reach;in chair Nurse Communication: Mobility status PT Visit Diagnosis: Muscle weakness (generalized) (M62.81);Difficulty in walking, not elsewhere classified (R26.2);Pain Pain - Right/Left: Right Pain - part of body: Hip     Time: 4481-8563 PT Time Calculation (min) (ACUTE ONLY): 35 min  Charges:  $Gait Training: 8-22 mins $Therapeutic Exercise: 8-22 mins                     Metro Kung, PT, DPT   Acute Rehabilitation Department [phone]   Gaetana Michaelis 05/15/2019, 10:23 AM

## 2019-05-16 LAB — GLUCOSE, CAPILLARY
Glucose-Capillary: 89 mg/dL (ref 70–99)
Glucose-Capillary: 94 mg/dL (ref 70–99)
Glucose-Capillary: 112 mg/dL — ABNORMAL HIGH (ref 70–99)
Glucose-Capillary: 110 mg/dL — ABNORMAL HIGH (ref 70–99)

## 2019-05-16 NOTE — Progress Notes (Signed)
Subjective: 5 Days Post-Op Procedure(s) (LRB): RIGHT TOTAL HIP ARTHROPLASTY ANTERIOR APPROACH (Right) Patient reports pain as mild.  Questions about SNF placement.   Objective: Vital signs in last 24 hours: Temp:  [96.9 F (36.1 C)-98.4 F (36.9 C)] 98.3 F (36.8 C) (11/14 0808) Pulse Rate:  [65-74] 68 (11/14 0808) Resp:  [15-18] 15 (11/14 0443) BP: (108-133)/(69-88) 133/69 (11/14 0808) SpO2:  [97 %-100 %] 98 % (11/14 0808)  Intake/Output from previous day: 11/13 0701 - 11/14 0700 In: 240 [P.O.:240] Out: 1100 [Urine:1100] Intake/Output this shift: No intake/output data recorded.  No results for input(s): HGB in the last 72 hours. No results for input(s): WBC, RBC, HCT, PLT in the last 72 hours. No results for input(s): NA, K, CL, CO2, BUN, CREATININE, GLUCOSE, CALCIUM in the last 72 hours. No results for input(s): LABPT, INR in the last 72 hours.  Neurovascular intact Dorsiflexion/Plantar flexion intact Incision: dressing C/D/I Compartment soft   Assessment/Plan: 5 Days Post-Op Procedure(s) (LRB): RIGHT TOTAL HIP ARTHROPLASTY ANTERIOR APPROACH (Right) Up with therapy  SNF when approved by insurance     [place] 05/16/2019, 9:37 AM

## 2019-05-16 NOTE — TOC Progression Note (Signed)
Transition of Care [facility]) - Progression Note    Patient Details  Name: Erik Black MRN: [id number] Date of Birth: 09/07/1950  Transition of Care [facility]) CM/SW [place], [place] Phone Number:  [phone] 05/16/2019, 9:35 AM  Clinical Narrative:     CSW called [name] to ascertain if insurance authorization was back. CSW was informed that was not back yet. CSW notified RN.  CSW will continue to follow for discharge planning needs.  Expected Discharge Plan: [facility] Barriers to Discharge: No SNF bed, Continued Medical Work up  Expected Discharge Plan and Services Expected Discharge Plan: [place] In-house Referral: Clinical Social Work   Post Acute Care Choice: [place] arrangements for the past 2 months: Single Family Home Expected Discharge Date: 05/15/19                                     Social Determinants of Health (SDOH) Interventions    Readmission Risk Interventions No flowsheet data found.

## 2019-05-16 NOTE — Progress Notes (Signed)
Physical Therapy Treatment Patient Details Name: Erik Black MRN: [id number] DOB: 09/15/1950 Today's Date: 05/16/2019    History of Present Illness Pt is a 68 y.o. male admitted on 05/11/19 for elective R THA direct anterior approach, WBAT post op.  Pt with significant PMH of HTN, glaucoma, DM, bil RTC repair, bil TKA, bil carpal tunnel release.      PT Comments    Pt progressing well towards achieving his current functional mobility goals.  Pt's spouse present throughout as well. Pt ambulated in hallway with RW and supervision, then participated in LE strengthening therex in standing with RW support. Pt would continue to benefit from skilled physical therapy services at this time while admitted and after d/c to address the below listed limitations in order to improve overall safety and independence with functional mobility.   Follow Up Recommendations  SNF     Equipment Recommendations  None recommended by PT    Recommendations for Other Services       Precautions / Restrictions Precautions Precautions: Fall Restrictions Weight Bearing Restrictions: Yes RLE Weight Bearing: Weight bearing as tolerated    Mobility  Bed Mobility Overal bed mobility: Needs Assistance Bed Mobility: Supine to Sit;Sit to Supine     Supine to sit: Supervision;HOB elevated Sit to supine: Supervision   General bed mobility comments: for safety  Transfers Overall transfer level: Needs assistance Equipment used: Rolling walker (2 wheeled) Transfers: Sit to/from Stand Sit to Stand: Min guard;Mod assist         General transfer comment: good technique utilized; min guard from EOB and mod A from low toilet  Ambulation/Gait Ambulation/Gait assistance: Supervision Gait Distance (Feet): 100 Feet Assistive device: Rolling walker (2 wheeled) Gait Pattern/deviations: Trunk flexed;Step-through pattern;Decreased stance time - right;Decreased weight shift to right     General Gait Details:  pt steady overall with no LOB or need for physical assistance   Stairs             Wheelchair Mobility    Modified Rankin (Stroke Patients Only)       Balance Overall balance assessment: Needs assistance Sitting-balance support: Feet supported;No upper extremity supported Sitting balance-Leahy Scale: Good     Standing balance support: Bilateral upper extremity supported;During functional activity;Single extremity supported Standing balance-Leahy Scale: Poor                              Cognition Arousal/Alertness: Awake/alert Behavior During Therapy: WFL for tasks assessed/performed Overall Cognitive Status: Within Functional Limits for tasks assessed                                        Exercises Total Joint Exercises Hip ABduction/ADduction: Standing;Strengthening;Right;10 reps Marching in Standing: Standing;Strengthening;Right;10 reps Standing Hip Extension: Standing;Strengthening;Right;10 reps General Exercises - Lower Extremity Mini-Sqauts: Standing;Both;10 reps    General Comments        Pertinent Vitals/Pain Pain Assessment: No/denies pain    Home Living                      Prior Function            PT Goals (current goals can now be found in the care plan section) Acute Rehab PT Goals PT Goal Formulation: With patient Time For Goal Achievement: 05/26/19 Potential to Achieve Goals: Good Progress towards PT goals: Progressing toward goals  Frequency    7X/week      PT Plan Current plan remains appropriate    Co-evaluation              AM-PAC PT "6 Clicks" Mobility   Outcome Measure  Help needed turning from your back to your side while in a flat bed without using bedrails?: None Help needed moving from lying on your back to sitting on the side of a flat bed without using bedrails?: None Help needed moving to and from a bed to a chair (including a wheelchair)?: None Help needed  standing up from a chair using your arms (e.g., wheelchair or bedside chair)?: A Lot Help needed to walk in hospital room?: None Help needed climbing 3-5 steps with a railing? : A Lot 6 Click Score: 20    End of Session Equipment Utilized During Treatment: Gait belt Activity Tolerance: Patient tolerated treatment well Patient left: in bed;with call bell/phone within reach;with family/visitor present Nurse Communication: Mobility status PT Visit Diagnosis: Muscle weakness (generalized) (M62.81);Difficulty in walking, not elsewhere classified (R26.2);Pain Pain - Right/Left: Right Pain - part of body: Hip     Time: 9357-0177 PT Time Calculation (min) (ACUTE ONLY): 26 min  Charges:  $Gait Training: 8-22 mins $Therapeutic Exercise: 8-22 mins                     Arletta Bale, DPT  Acute Rehabilitation Services Pager [phone] Office [phone]     Alessandra Bevels Korinna Tat 05/16/2019, 2:19 PM

## 2019-05-16 NOTE — Plan of Care (Signed)
  Problem: Activity: Goal: Risk for activity intolerance will decrease Outcome: Progressing   Problem: Coping: Goal: Level of anxiety will decrease Outcome: Progressing   Problem: Pain Managment: Goal: General experience of comfort will improve Outcome: Progressing   Problem: Safety: Goal: Ability to remain free from injury will improve Outcome: Progressing   

## 2019-05-17 LAB — GLUCOSE, CAPILLARY
Glucose-Capillary: 80 mg/dL (ref 70–99)
Glucose-Capillary: 83 mg/dL (ref 70–99)
Glucose-Capillary: 79 mg/dL (ref 70–99)
Glucose-Capillary: 127 mg/dL — ABNORMAL HIGH (ref 70–99)

## 2019-05-17 NOTE — Progress Notes (Signed)
Occupational Therapy Treatment Patient Details Name: Erik Black MRN: [id number] DOB: 09/29/1950 Today's Date: 05/17/2019    History of present illness Pt is a 68 y.o. male admitted on 05/11/19 for elective R THA direct anterior approach, WBAT post op.  Pt with significant PMH of HTN, glaucoma, DM, bil RTC repair, bil TKA, bil carpal tunnel release.     OT comments  Pt. Seen for skilled OT treatment session.  Continued review and use of A/E for LB ADLs.  Pt. Eager to progress with increasing safety and I with ADLs.  Reports having a reacher but interested in [place] sponge, LH shoehorn, and sock-aide.    Follow Up Recommendations  Follow surgeon's recommendation for DC plan and follow-up therapies;SNF    Equipment Recommendations       Recommendations for Other Services      Precautions / Restrictions Precautions Precautions: Fall Restrictions RLE Weight Bearing: Weight bearing as tolerated       Mobility Bed Mobility               General bed mobility comments: seated in recliner for duration of session  Transfers                      Balance                                           ADL either performed or assessed with clinical judgement   ADL Overall ADL's : Needs assistance/impaired             Lower Body Bathing: With adaptive equipment Lower Body Bathing Details (indicate cue type and reason): provided demo for use of LH sponge     Lower Body Dressing: Minimal assistance;With adaptive equipment;Cueing for sequencing;Sitting/lateral leans Lower Body Dressing Details (indicate cue type and reason): pt. reports using a reacher prior to sx. but did not have a sock aide. reports socks took FOREVER to don with reacher. able to return demo of use of reacher and sock aide for don/doff socks   Toilet Transfer Details (indicate cue type and reason): reports recent "changes" with "no warning" before he needs to urinate.  having  "accidents" is very embarrasing to him.  reviewed use of urinal if those occurances present as rushing is a fall risk.           General ADL Comments: pt. very interested in A/E all pieces for LB ADLS.  reports it is very important that "his manhood is not stripped from him".  states it is one of his main reasons for wanting cont. therapy snf prior to home.  is firm he does not want his wife assisting with things he can complete himself.  eager to progress and maintain safety with ADL completion     Vision       Perception     Praxis      Cognition Arousal/Alertness: Awake/alert Behavior During Therapy: WFL for tasks assessed/performed Overall Cognitive Status: Within Functional Limits for tasks assessed                                          Exercises     Shoulder Instructions       General Comments  pt. 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  Loves to be outside.  Is very active in his church and was in the choir.      Pertinent Vitals/ Pain       Pain Assessment: No/denies pain  Home Living                                          Prior Functioning/Environment              Frequency  Min 2X/week        Progress Toward Goals  OT Goals(current goals can now be found in the care plan section)  Progress towards OT goals: Progressing toward goals     Plan      Co-evaluation                 AM-PAC OT "6 Clicks" Daily Activity     Outcome Measure   Help from another person eating meals?: None Help from another person taking care of personal grooming?: A Little Help from another person toileting, which includes using toliet, bedpan, or urinal?: A Little Help from another person bathing (including washing, rinsing, drying)?: A Little Help from another person to put on and taking off regular upper body clothing?: A Little Help from another person to put on and taking off  regular lower body clothing?: A Lot 6 Click Score: 18    End of Session Equipment Utilized During Treatment: Other (comment)(A/E kit)  OT Visit Diagnosis: Pain Pain - Right/Left: Right Pain - part of body: Leg   Activity Tolerance Patient tolerated treatment well   Patient Left in chair;with call bell/phone within reach   Nurse Communication          Time: 2767-0110 OT Time Calculation (min): 31 min  Charges: OT General Charges $OT Visit: 1 Visit OT Treatments $Self Care/Home Management : 23-37 mins   Janice Coffin, COTA/L 05/17/2019, 9:50 AM

## 2019-05-17 NOTE — Progress Notes (Signed)
Physical Therapy Treatment Patient Details Name: Erik Black MRN: [id number] DOB: March 26, 1951 Today's Date: 05/17/2019    History of Present Illness Pt is a 68 y.o. male admitted on 05/11/19 for elective R THA direct anterior approach, WBAT post op.  Pt with significant PMH of HTN, glaucoma, DM, bil RTC repair, bil TKA, bil carpal tunnel release.      PT Comments    Continuing work on functional mobility and activity tolerance;  Very nice progress with amb, including progressing amb distance; still needing work on posture - he is aware and working hard on strength and stability in single limb stance; He is clearly no stranger to hard work, and values his independ3nce with mobility and ADLs; Overall progressing well; Anticipate continuing good progress at post-acute rehabilitation.    Follow Up Recommendations  SNF;Follow surgeon's recommendation for DC plan and follow-up therapies     Equipment Recommendations  None recommended by PT    Recommendations for Other Services       Precautions / Restrictions Precautions Precautions: Fall Restrictions RLE Weight Bearing: Weight bearing as tolerated    Mobility  Bed Mobility               General bed mobility comments: Sitting EOB upon my arrival  Transfers Overall transfer level: Needs assistance Equipment used: Rolling walker (2 wheeled) Transfers: Sit to/from Stand Sit to Stand: Min guard         General transfer comment: [place] for safety; slow rise, but not needing physical assist from the bed; very slow stand to sit  Ambulation/Gait Ambulation/Gait assistance: Supervision Gait Distance (Feet): 140 Feet Assistive device: Rolling walker (2 wheeled) Gait Pattern/deviations: Trunk flexed;Step-through pattern;Decreased stance time - right;Decreased weight shift to right     General Gait Details: pt steady overall with no LOB or need for physical assistance; Bil hips slightly flexed throughout entire gait  cycle; Cues to fully extend hips in stance, and to "stand tall" on his RLE in stance to get more quad and gluteal muscle recruitment for stability   Stairs             Wheelchair Mobility    Modified Rankin (Stroke Patients Only)       Balance     Sitting balance-Leahy Scale: Good       Standing balance-Leahy Scale: Poor                              Cognition Arousal/Alertness: Awake/alert Behavior During Therapy: WFL for tasks assessed/performed Overall Cognitive Status: Within Functional Limits for tasks assessed                                        Exercises Total Joint Exercises Hip ABduction/ADduction: Standing;Strengthening;Right;10 reps Marching in Standing: Standing;Strengthening;Right;10 reps Standing Hip Extension: Standing;Strengthening;Right;10 reps Other Exercises Other Exercises: Reps in holding R single limb stance with tactile cueing to keep R hip fully extended in stance 5 second hold x 8 reps    General Comments        Pertinent Vitals/Pain Pain Assessment: 0-10 Faces Pain Scale: Hurts a little bit Pain Location: R thigh Pain Descriptors / Indicators: Burning;Sore Pain Intervention(s): Monitored during session    Home Living                      Prior Function  PT Goals (current goals can now be found in the care plan section) Acute Rehab PT Goals Patient Stated Goal: to not be a burden on his wife; and get back to yardwork PT Goal Formulation: With patient Time For Goal Achievement: 05/26/19 Potential to Achieve Goals: Good Progress towards PT goals: Progressing toward goals    Frequency    7X/week      PT Plan Current plan remains appropriate    Co-evaluation              AM-PAC PT "6 Clicks" Mobility   Outcome Measure  Help needed turning from your back to your side while in a flat bed without using bedrails?: None Help needed moving from lying on your back  to sitting on the side of a flat bed without using bedrails?: A Little Help needed moving to and from a bed to a chair (including a wheelchair)?: None Help needed standing up from a chair using your arms (e.g., wheelchair or bedside chair)?: A Little Help needed to walk in hospital room?: A Little Help needed climbing 3-5 steps with a railing? : A Lot 6 Click Score: 19    End of Session Equipment Utilized During Treatment: Gait belt Activity Tolerance: Patient tolerated treatment well Patient left: in chair;with call bell/phone within reach Nurse Communication: Mobility status PT Visit Diagnosis: Muscle weakness (generalized) (M62.81);Difficulty in walking, not elsewhere classified (R26.2);Pain Pain - Right/Left: Right Pain - part of body: Hip     Time: 0828-0859 PT Time Calculation (min) (ACUTE ONLY): 31 min  Charges:  $Gait Training: 8-22 mins $Therapeutic Exercise: 8-22 mins                     Roney Marion, PT  Acute Rehabilitation Services Pager [phone] Office [place] 05/17/2019, 11:30 AM

## 2019-05-17 NOTE — Progress Notes (Signed)
Subjective: 6 Days Post-Op Procedure(s) (LRB): RIGHT TOTAL HIP ARTHROPLASTY ANTERIOR APPROACH (Right) Patient reports pain as mild.  No complaints.   Objective: Vital signs in last 24 hours: Temp:  [98.3 F (36.8 C)-98.8 F (37.1 C)] 98.8 F (37.1 C) (11/15 0439) Pulse Rate:  [68-77] 73 (11/15 0439) Resp:  [16] 16 (11/15 0439) BP: (116-133)/(63-87) 119/74 (11/15 0439) SpO2:  [98 %-100 %] 99 % (11/15 0439)  Intake/Output from previous day: 11/14 0701 - 11/15 0700 In: 680 [P.O.:680] Out: 700 [Urine:700] Intake/Output this shift: No intake/output data recorded.  No results for input(s): HGB in the last 72 hours. No results for input(s): WBC, RBC, HCT, PLT in the last 72 hours. No results for input(s): NA, K, CL, CO2, BUN, CREATININE, GLUCOSE, CALCIUM in the last 72 hours. No results for input(s): LABPT, INR in the last 72 hours.  Right lower extremity: Dorsiflexion/Plantar flexion intact Incision: dressing C/D/I Compartment soft   Assessment/Plan: 6 Days Post-Op Procedure(s) (LRB): RIGHT TOTAL HIP ARTHROPLASTY ANTERIOR APPROACH (Right) Up with therapy Discharge to SNF when bed available      Erik Black 05/17/2019, 7:56 AM

## 2019-05-17 NOTE — Progress Notes (Signed)
Physical Therapy Treatment Patient Details Name: Eagle Pitta MRN: [id number] DOB: 08-25-1950 Today's Date: 05/17/2019    History of Present Illness Pt is a 68 y.o. male admitted on 05/11/19 for elective R THA direct anterior approach, WBAT post op.  Pt with significant PMH of HTN, glaucoma, DM, bil RTC repair, bil TKA, bil carpal tunnel release.      PT Comments    Continuing work on functional mobility and activity tolerance;  Walked a good distance in the hallway followed by bed therex; Needs continued cues for posture with amb -- but overall doing quite well   Follow Up Recommendations  SNF;Follow surgeon's recommendation for DC plan and follow-up therapies     Equipment Recommendations  None recommended by PT    Recommendations for Other Services       Precautions / Restrictions Precautions Precautions: Fall Restrictions Weight Bearing Restrictions: Yes RLE Weight Bearing: Weight bearing as tolerated    Mobility  Bed Mobility Overal bed mobility: Needs Assistance Bed Mobility: Sit to Supine       Sit to supine: Supervision   General bed mobility comments: Smooth transition back to laying down; did not need physical assist  Transfers Overall transfer level: Needs assistance Equipment used: Rolling walker (2 wheeled) Transfers: Sit to/from Stand Sit to Stand: Min guard(without physiscal contact)         General transfer comment: Minguard for safety; slow rise, but not needing physical assist from the low recliner;  very slow stand to sit  Ambulation/Gait Ambulation/Gait assistance: Supervision Gait Distance (Feet): 140 Feet Assistive device: Rolling walker (2 wheeled) Gait Pattern/deviations: Trunk flexed;Step-through pattern;Decreased stance time - right;Decreased weight shift to right     General Gait Details: pt steady overall with no LOB or need for physical assistance; Bil hips slightly flexed throughout entire gait cycle; Cues to fully extend  hips in stance, and to "stand tall" on his RLE in stance to get more quad and gluteal muscle recruitment for stability   Stairs             Wheelchair Mobility    Modified Rankin (Stroke Patients Only)       Balance                                            Cognition Arousal/Alertness: Awake/alert Behavior During Therapy: WFL for tasks assessed/performed Overall Cognitive Status: Within Functional Limits for tasks assessed                                        Exercises Total Joint Exercises Ankle Circles/Pumps: AROM;Both;20 reps Quad Sets: AROM;Right;10 reps Gluteal Sets: AROM;Both;10 reps Towel Squeeze: AROM;Both;10 reps Heel Slides: AROM;Right;10 reps Hip ABduction/ADduction: AROM;Right;10 reps;Supine Bridges: AROM;Both;10 reps    General Comments        Pertinent Vitals/Pain Pain Assessment: 0-10 Pain Score: 5  Pain Location: R thigh Pain Descriptors / Indicators: Burning;Sore Pain Intervention(s): Monitored during session    Home Living                      Prior Function            PT Goals (current goals can now be found in the care plan section) Acute Rehab PT Goals Patient Stated Goal: to not  be a burden on his wife; and get back to yardwork PT Goal Formulation: With patient Time For Goal Achievement: 05/26/19 Potential to Achieve Goals: Good Progress towards PT goals: Progressing toward goals    Frequency    7X/week      PT Plan Current plan remains appropriate    Co-evaluation              AM-PAC PT "6 Clicks" Mobility   Outcome Measure  Help needed turning from your back to your side while in a flat bed without using bedrails?: None Help needed moving from lying on your back to sitting on the side of a flat bed without using bedrails?: A Little Help needed moving to and from a bed to a chair (including a wheelchair)?: None Help needed standing up from a chair using your  arms (e.g., wheelchair or bedside chair)?: A Little Help needed to walk in hospital room?: A Little Help needed climbing 3-5 steps with a railing? : A Lot 6 Click Score: 19    End of Session Equipment Utilized During Treatment: Gait belt Activity Tolerance: Patient tolerated treatment well Patient left: with call bell/phone within reach;in bed;with bed alarm set Nurse Communication: Mobility status PT Visit Diagnosis: Muscle weakness (generalized) (M62.81);Difficulty in walking, not elsewhere classified (R26.2);Pain Pain - Right/Left: Right Pain - part of body: Hip     Time: 9233-0076 PT Time Calculation (min) (ACUTE ONLY): 31 min  Charges:  $Gait Training: 8-22 mins $Therapeutic Exercise: 8-22 mins                     Roney Marion, PT  Acute Rehabilitation Services Pager [phone] Office [place] 05/17/2019, 5:24 PM

## 2019-05-18 LAB — GLUCOSE, CAPILLARY
Glucose-Capillary: 84 mg/dL (ref 70–99)
Glucose-Capillary: 102 mg/dL — ABNORMAL HIGH (ref 70–99)
Glucose-Capillary: 128 mg/dL — ABNORMAL HIGH (ref 70–99)
Glucose-Capillary: 106 mg/dL — ABNORMAL HIGH (ref 70–99)

## 2019-05-18 LAB — SARS CORONAVIRUS 2 (TAT 6-24 HRS): SARS Coronavirus 2: NEGATIVE

## 2019-05-18 NOTE — Progress Notes (Signed)
Subjective: 7 Days Post-Op Procedure(s) (LRB): RIGHT TOTAL HIP ARTHROPLASTY ANTERIOR APPROACH (Right) Patient reports pain as mild.    Objective: Vital signs in last 24 hours: Temp:  [97.7 F (36.5 C)-99.1 F (37.3 C)] 97.7 F (36.5 C) (11/16 0310) Pulse Rate:  [67-80] 67 (11/16 0310) Resp:  [17-18] 18 (11/16 0310) BP: (108-137)/(66-80) 108/66 (11/16 0310) SpO2:  [98 %-100 %] 100 % (11/16 0310)  Intake/Output from previous day: 11/15 0701 - 11/16 0700 In: 480 [P.O.:480] Out: 400 [Urine:400] Intake/Output this shift: No intake/output data recorded.  No results for input(s): HGB in the last 72 hours. No results for input(s): WBC, RBC, HCT, PLT in the last 72 hours. No results for input(s): NA, K, CL, CO2, BUN, CREATININE, GLUCOSE, CALCIUM in the last 72 hours. No results for input(s): LABPT, INR in the last 72 hours.  Neurologically intact Neurovascular intact Sensation intact distally Intact pulses distally Dorsiflexion/Plantar flexion intact Incision: dressing C/D/I No cellulitis present Compartment soft   Assessment/Plan: 7 Days Post-Op Procedure(s) (LRB): RIGHT TOTAL HIP ARTHROPLASTY ANTERIOR APPROACH (Right) Up with therapy Discharge to SNF once bed available Will repeat snf covid test as it has been greater than 48 hours since last test WBAT RLE       Aundra Dubin 05/18/2019, 7:04 AM

## 2019-05-18 NOTE — Plan of Care (Signed)
  Problem: Clinical Measurements: Goal: Respiratory complications will improve Outcome: Progressing   Problem: Activity: Goal: Risk for activity intolerance will decrease Outcome: Progressing   Problem: Coping: Goal: Level of anxiety will decrease Outcome: Progressing   Problem: Elimination: Goal: Will not experience complications related to urinary retention Outcome: Progressing   Problem: Pain Managment: Goal: General experience of comfort will improve Outcome: Progressing   Problem: Safety: Goal: Ability to remain free from injury will improve Outcome: Progressing   Problem: Nutrition: Goal: Adequate nutrition will be maintained Outcome: Completed/Met

## 2019-05-18 NOTE — Progress Notes (Signed)
Physical Therapy Treatment Patient Details Name: Erik Black MRN: [id number] DOB: [birth year] Today's Date: 05/18/2019    History of Present Illness Pt is a 68 y.o. male admitted on 05/11/19 for elective R THA direct anterior approach, WBAT post op.  Pt with significant PMH of HTN, glaucoma, DM, bil RTC repair, bil TKA, bil carpal tunnel release.      PT Comments    Pt sitting EOB upon PT arrival, agreeable to PT. Pt was able to demo improvements in ambulation posture as well as gait speed and pattern today. Pt still uses RW and supervision, but was able to demo changes in amb speed (normal pace: 0.36 m/s vs fast pace: 0.67 m/s) upon request with no changes in balance or safety. Pt then educated in exercises to complete sitting in chair and was able to demo good tolerance to standing exercises with supervision for balance. Pt will continue to benefit from skilled PT to maximize rehab potential and return to prior level of function.    Follow Up Recommendations  SNF;Follow surgeon's recommendation for DC plan and follow-up therapies     Equipment Recommendations  None recommended by PT    Recommendations for Other Services       Precautions / Restrictions Precautions Precautions: Fall Precaution Booklet Issued: Yes (comment) Restrictions Weight Bearing Restrictions: Yes RLE Weight Bearing: Weight bearing as tolerated    Mobility  Bed Mobility Overal bed mobility: Modified Independent             General bed mobility comments: pt sitting EOB upon arrival  Transfers Overall transfer level: Needs assistance Equipment used: Rolling walker (2 wheeled) Transfers: Sit to/from Stand Sit to Stand: Min guard         General transfer comment: Minguard for safety; slow rise, but not needing physical assist from the low recliner;  very slow stand to sit  Ambulation/Gait Ambulation/Gait assistance: Supervision Gait Distance (Feet): 200 Feet Assistive device: Rolling  walker (2 wheeled) Gait Pattern/deviations: Trunk flexed;Step-through pattern;Decreased stance time - right;Decreased weight shift to right Gait velocity: 0.36 m/s (normal pace) 0.67 m/s (fast pace) Gait velocity interpretation: <1.31 ft/sec, indicative of household ambulator General Gait Details: Pt steady with amb, but does continue to ambulate with forward flexed posture and thoracic kyphosis and elevated shoulders. Pt can maintain better posture with cues. Pt also encouraged to complete multiple trials of "fast" walking, and was able to complete 20 ft x 3 of fast walking with noticeable change in pace.   Stairs             Wheelchair Mobility    Modified Rankin (Stroke Patients Only)       Balance Overall balance assessment: Needs assistance Sitting-balance support: Feet supported;No upper extremity supported Sitting balance-Leahy Scale: Good Sitting balance - Comments: static sitting supervision   Standing balance support: Bilateral upper extremity supported;During functional activity;Single extremity supported Standing balance-Leahy Scale: Poor Standing balance comment: able to stand stationary with single UE support, prefers BUE                            Cognition Arousal/Alertness: Awake/alert Behavior During Therapy: WFL for tasks assessed/performed Overall Cognitive Status: Within Functional Limits for tasks assessed                                        Exercises Total Joint Exercises Long  Arc Quad: AROM;10 reps;Both;Seated(2 x 10) Marching in Standing: Standing;Strengthening;20 reps;Both;AROM Other Exercises Other Exercises: Standing Calf raises 2 x 10 B with BUE support on counter Other Exercises: Standing HS curl, 2 x 10 B with BUE support on counter    General Comments        Pertinent Vitals/Pain Pain Assessment: No/denies pain(reports some burning in R hip with activity) Pain Intervention(s): Limited activity within  patient's tolerance;Monitored during session;Repositioned    Home Living                      Prior Function            PT Goals (current goals can now be found in the care plan section) Acute Rehab PT Goals Patient Stated Goal: to not be a burden on his wife; and get back to yardwork PT Goal Formulation: With patient Time For Goal Achievement: 05/26/19 Potential to Achieve Goals: Good Progress towards PT goals: Progressing toward goals    Frequency    7X/week      PT Plan Current plan remains appropriate    Co-evaluation              AM-PAC PT "6 Clicks" Mobility   Outcome Measure  Help needed turning from your back to your side while in a flat bed without using bedrails?: None Help needed moving from lying on your back to sitting on the side of a flat bed without using bedrails?: A Little Help needed moving to and from a bed to a chair (including a wheelchair)?: A Little Help needed standing up from a chair using your arms (e.g., wheelchair or bedside chair)?: A Little Help needed to walk in hospital room?: A Little Help needed climbing 3-5 steps with a railing? : A Lot 6 Click Score: 18    End of Session Equipment Utilized During Treatment: Gait belt Activity Tolerance: Patient tolerated treatment well Patient left: with call bell/phone within reach;in chair Nurse Communication: Mobility status PT Visit Diagnosis: Muscle weakness (generalized) (M62.81);Difficulty in walking, not elsewhere classified (R26.2);Pain Pain - Right/Left: Right Pain - part of body: Hip     Time: 1035-1110 PT Time Calculation (min) (ACUTE ONLY): 35 min  Charges:  $Gait Training: 8-22 mins $Therapeutic Exercise: 8-22 mins                     Mickey Farber, PT, DPT   Acute Rehabilitation Department [phone]   Otho Bellows 05/18/2019, 1:03 PM

## 2019-05-18 NOTE — TOC Progression Note (Signed)
Transition of Care [facility]) - Progression Note    Patient Details  Name: Erik Black MRN: [id number] Date of Birth: 05-Sep-1950  Transition of Care [facility]) CM/SW [place], [place] Phone Number: 05/18/2019, 2:07 PM  Clinical Narrative:    CSW followed up with Olivia Mackie from [facility] and she states that she has still not received insurance authorization for this patient. She states that she will call and follow up with this CSW. Will need new COVID test as previous one is greater than 48 hours.    Expected Discharge Plan: [place] Barriers to Discharge: No SNF bed, Continued Medical Work up  Expected Discharge Plan and Services Expected Discharge Plan: [place] In-house Referral: Clinical Social Work   Post Acute Care Choice: [place] arrangements for the past 2 months: Single Family Home Expected Discharge Date: 05/18/19                                     Social Determinants of Health (SDOH) Interventions    Readmission Risk Interventions No flowsheet data found.

## 2019-05-18 NOTE — Progress Notes (Signed)
Physical Therapy Treatment Patient Details Name: Erik Black MRN: [id number] DOB: 20-Feb-1951 Today's Date: 05/18/2019    History of Present Illness Pt is a 68 y.o. male admitted on 05/11/19 for elective R THA direct anterior approach, WBAT post op.  Pt with significant PMH of HTN, glaucoma, DM, bil RTC repair, bil TKA, bil carpal tunnel release.      PT Comments    Pt in bed with wife present upon PT arrival, pt agreeable to PT session, but reports fatigue from being up in chair all afternoon. Pt was educated in bed-level exercises (heel slides and hip ABD/ADD) which he completed with good technique. Pt then came to sitting EOB without assist and was able to stand from an elevated surface with min guard. Pt completed standing hip ABD/ADD as well before ambulating in hall. Pt continues to benefit from VCs for posture and gait pattern (heel-toe) while walking, will continue to benefit from skilled PT to maximize rehab potential and functional mobility.     Follow Up Recommendations  SNF;Follow surgeon's recommendation for DC plan and follow-up therapies     Equipment Recommendations  None recommended by PT    Recommendations for Other Services       Precautions / Restrictions Precautions Precautions: Fall Precaution Booklet Issued: Yes (comment) Restrictions Weight Bearing Restrictions: Yes RLE Weight Bearing: Weight bearing as tolerated    Mobility  Bed Mobility Overal bed mobility: Needs Assistance Bed Mobility: Supine to Sit     Supine to sit: Supervision;HOB elevated     General bed mobility comments: pt left sitting EOB  Transfers Overall transfer level: Needs assistance Equipment used: Rolling walker (2 wheeled) Transfers: Sit to/from Stand Sit to Stand: Min guard         General transfer comment: Minguard for safety; slow rise, but not needing physical assist from the low recliner;  very slow stand to sit. poor eccentric  lower  Ambulation/Gait Ambulation/Gait assistance: Supervision Gait Distance (Feet): 100 Feet Assistive device: Rolling walker (2 wheeled) Gait Pattern/deviations: Trunk flexed;Step-through pattern;Decreased stance time - right;Decreased weight shift to right Gait velocity: 0.36 m/s Gait velocity interpretation: <1.31 ft/sec, indicative of household ambulator General Gait Details: Pt steady with amb, but does continue to ambulate with forward flexed posture and thoracic kyphosis and elevated shoulders. Pt can maintain better posture with cues.   Stairs             Wheelchair Mobility    Modified Rankin (Stroke Patients Only)       Balance Overall balance assessment: Needs assistance Sitting-balance support: Feet supported;No upper extremity supported Sitting balance-Leahy Scale: Good Sitting balance - Comments: static sitting supervision   Standing balance support: Bilateral upper extremity supported;During functional activity;Single extremity supported Standing balance-Leahy Scale: Poor Standing balance comment: able to stand stationary with single UE support, prefers BUE                            Cognition Arousal/Alertness: Awake/alert Behavior During Therapy: WFL for tasks assessed/performed Overall Cognitive Status: Within Functional Limits for tasks assessed                                        Exercises Total Joint Exercises Heel Slides: AROM;10 reps;Both;Supine(2 x 10) Hip ABduction/ADduction: AROM;10 reps;Supine;Both;Standing(2 x 10) Long Arc Quad: AROM;10 reps;Both;Seated(2 x 10) Marching in Standing: Standing;Strengthening;20 reps;Both;AROM Other Exercises Other  Exercises: Standing Calf raises 2 x 10 B with BUE support on counter Other Exercises: Standing HS curl, 2 x 10 B with BUE support on counter    General Comments        Pertinent Vitals/Pain Pain Assessment: No/denies pain(reports some burning in R hip with  exercises) Pain Intervention(s): Limited activity within patient's tolerance;Monitored during session    Home Living                      Prior Function            PT Goals (current goals can now be found in the care plan section) Acute Rehab PT Goals Patient Stated Goal: to not be a burden on his wife; and get back to yardwork PT Goal Formulation: With patient Time For Goal Achievement: 05/26/19 Potential to Achieve Goals: Good Progress towards PT goals: Progressing toward goals    Frequency    7X/week      PT Plan Current plan remains appropriate    Co-evaluation              AM-PAC PT "6 Clicks" Mobility   Outcome Measure  Help needed turning from your back to your side while in a flat bed without using bedrails?: None Help needed moving from lying on your back to sitting on the side of a flat bed without using bedrails?: A Little Help needed moving to and from a bed to a chair (including a wheelchair)?: A Little Help needed standing up from a chair using your arms (e.g., wheelchair or bedside chair)?: A Little Help needed to walk in hospital room?: A Little Help needed climbing 3-5 steps with a railing? : A Lot 6 Click Score: 18    End of Session Equipment Utilized During Treatment: Gait belt Activity Tolerance: Patient tolerated treatment well Patient left: with call bell/phone within reach;in bed;with family/visitor present Nurse Communication: Mobility status PT Visit Diagnosis: Muscle weakness (generalized) (M62.81);Difficulty in walking, not elsewhere classified (R26.2);Pain Pain - Right/Left: Right Pain - part of body: Hip     Time: 7169-6789 PT Time Calculation (min) (ACUTE ONLY): 26 min  Charges:  $Gait Training: 8-22 mins $Therapeutic Exercise: 8-22 mins                     Mickey Farber, PT, DPT   Acute Rehabilitation Department [phone]   Otho Bellows 05/18/2019, 4:10 PM

## 2019-05-19 LAB — GLUCOSE, CAPILLARY
Glucose-Capillary: 90 mg/dL (ref 70–99)
Glucose-Capillary: 86 mg/dL (ref 70–99)

## 2019-05-19 MED ORDER — SULFAMETHOXAZOLE-TRIMETHOPRIM 800-160 MG PO TABS
1.0000 | ORAL_TABLET | Freq: Two times a day (BID) | ORAL | Status: DC
  Administered 2019-05-19: 1 via ORAL
  Filled 2019-05-19: qty 1

## 2019-05-19 NOTE — Progress Notes (Signed)
Erik Black to be D/C'd home per MD order.  Discussed prescriptions and follow up appointments with the patient and patient's wife. Prescriptions given to patient's wife, medication list explained in detail. Pt and wife verbalized understanding.  Allergies as of 05/19/2019   No Known Allergies     Medication List    TAKE these medications   amLODipine 10 MG tablet Commonly known as: NORVASC Take 10 mg by mouth daily.   aspirin EC 81 MG tablet Take 1 tablet (81 mg total) by mouth 2 (two) times daily. What changed: when to take this   Belbuca 450 MCG Film Generic drug: Buprenorphine HCl Take 450 mcg by mouth 2 (two) times daily.   doxazosin 8 MG tablet Commonly known as: CARDURA Take 8 mg by mouth daily.   DULoxetine 60 MG capsule Commonly known as: CYMBALTA Take 60 mg by mouth daily.   fluticasone 50 MCG/ACT nasal spray Commonly known as: FLONASE Place 1 spray into both nostrils as needed.   hydrochlorothiazide 25 MG tablet Commonly known as: HYDRODIURIL Take 25 mg by mouth daily.   latanoprost 0.005 % ophthalmic solution Commonly known as: XALATAN Place 1 drop into both eyes at bedtime.   losartan 100 MG tablet Commonly known as: COZAAR Take 100 mg by mouth daily.   methocarbamol 750 MG tablet Commonly known as: ROBAXIN Take 1 tablet (750 mg total) by mouth 2 (two) times daily as needed for muscle spasms.   metoprolol tartrate 50 MG tablet Commonly known as: LOPRESSOR Take 50 mg by mouth 2 (two) times daily.   multivitamin with minerals tablet Take 1 tablet by mouth daily.   omega-3 acid ethyl esters 1 g capsule Commonly known as: LOVAZA Take 2 g by mouth 3 (three) times daily. 300 mg EPA/2mg -dha   omeprazole 20 MG capsule Commonly known as: PRILOSEC Take 20 mg by mouth daily.   ondansetron 4 MG tablet Commonly known as: ZOFRAN Take 1-2 tablets (4-8 mg total) by mouth every 8 (eight) hours as needed for nausea or vomiting.   oxyCODONE 10 mg  12 hr tablet Commonly known as: OXYCONTIN Take 10 mg by mouth 3 (three) times daily as needed (pain).   oxyCODONE-acetaminophen 5-325 MG tablet Commonly known as: Percocet Take 1-2 tablets by mouth every 8 (eight) hours as needed for severe pain.   phentermine 37.5 MG tablet Commonly known as: ADIPEX-P Take 37.5 mg by mouth daily.   potassium chloride SA 20 MEQ tablet Commonly known as: KLOR-CON Take 40-60 mEq by mouth daily. 60 meq in the morning and 40 meq in the evening   promethazine 25 MG tablet Commonly known as: PHENERGAN Take 1 tablet (25 mg total) by mouth every 6 (six) hours as needed for nausea.   Simbrinza 1-0.2 % Susp Generic drug: Brinzolamide-Brimonidine Place 1 drop into both eyes 3 (three) times daily.   sulfamethoxazole-trimethoprim 800-160 MG tablet Commonly known as: BACTRIM DS Take 1 tablet by mouth 2 (two) times daily.   timolol 0.5 % ophthalmic gel-forming Commonly known as: TIMOPTIC-XR Place 1 drop into both eyes at bedtime.   Trulicity 0.75 MG/0.5ML Sopn Generic drug: Dulaglutide Inject 0.75 mg into the skin once a week. Tuesday   Vitamin D 50 MCG (2000 UT) tablet Take 2,000 Units by mouth daily.            Durable Medical Equipment  (From admission, onward)         Start     Ordered   05/11/19 1613  DME Dan Humphreys  rolling  Once    Question:  Patient needs a walker to treat with the following condition  Answer:  History of hip replacement   05/11/19 1612   05/11/19 1613  DME 3 n 1  Once     05/11/19 1612   05/11/19 1613  DME Bedside commode  Once    Question:  Patient needs a bedside commode to treat with the following condition  Answer:  History of hip replacement   05/11/19 1612          Vitals:   05/19/19 0302 05/19/19 0805  BP: 117/62 (!) 127/95  Pulse: 71 74  Resp:  18  Temp: 98 F (36.7 C) 98.3 F (36.8 C)  SpO2: 95% 98%   IV catheter discontinued intact. Pt denies pain at this time. No complaints noted.  An After  Visit Summary was printed and given to the patient. Patient escorted via [place], and D/C home via private auto with wife.  [place] 05/19/2019 3:19 PM

## 2019-05-19 NOTE — TOC Transition Note (Signed)
Transition of Care [facility]) - CM/SW Discharge Note   Patient Details  Name: Acxel Dingee MRN: [id number] Date of Birth: 08/06/1950  Transition of Care [facility]) CM/SW Contact:  Atilano Median, LCSW Phone Number: 05/19/2019, 3:49 PM   Clinical Narrative:    Discharged home with home health services. DME delivered to room prior to discharge. Patient aware and agreeable to this plan. No other needs at this time.Case closed to this CSW.    Final next level of care: Skilled Nursing Facility Barriers to Discharge: Barriers Resolved   Patient Goals and CMS Choice Patient states their goals for this hospitalization and ongoing recovery are:: go to rehab CMS Medicare.gov Compare Post Acute Care list provided to:: Patient Choice offered to / list presented to : Patient  Discharge Placement                       Discharge Plan and Services In-house Referral: Clinical Social Work   Post Acute Care Choice: [place]          DME Arranged: Gilford Rile rolling, 3-N-1 DME Agency: [employer] Date DME Agency Contacted: 05/19/19 Time DME Agency Contacted: [phone] Representative spoke with at DME Agency: [place]: PT [place]: [facility] [employer] Date [place]: 05/19/19 Time [place]: [place] Representative spoke with at [place]: [place] ([place]) Interventions     Readmission Risk Interventions No flowsheet data found.

## 2019-05-19 NOTE — Progress Notes (Signed)
Patient ID: Erik Black, male   DOB: 01-Feb-1951, 68 y.o.   MRN: [id number]  At this point, the patient has progressed well enough with PT that he is safe to go home with hhpt.

## 2019-05-19 NOTE — Progress Notes (Signed)
Physical Therapy Treatment Patient Details Name: Erik Black MRN: [id number] DOB: [birth year] Today's Date: 05/19/2019    History of Present Illness Pt is a 68 y.o. male admitted on 05/11/19 for elective R THA direct anterior approach, WBAT post op.  Pt with significant PMH of HTN, glaucoma, DM, bil RTC repair, bil TKA, bil carpal tunnel release.      PT Comments    Pt supine finished with breakfast and eager to mobilize. Pt noted to be incontinent of urine on arrival and was able to pivot to EOB and stand for pericare and linen change. PT with mobility at supervision level including gait and stairs and performing all HEp in standing. Pt with excellent progression and reports assist of wife at home with willingness to return home. Pt educated for progressive mobility and ortho PA aware.    Follow Up Recommendations  Follow surgeon's recommendation for DC plan and follow-up therapies;Home health PT     Equipment Recommendations  None recommended by PT    Recommendations for Other Services       Precautions / Restrictions Precautions Precautions: Fall Restrictions RLE Weight Bearing: Weight bearing as tolerated    Mobility  Bed Mobility Overal bed mobility: Modified Independent Bed Mobility: Supine to Sit     Supine to sit: Supervision     General bed mobility comments: increased time with reliance on rail and HOB 25 degrees, pt denied assist and needed cues to ease transition  Transfers Overall transfer level: Needs assistance   Transfers: Sit to/from Stand Sit to Stand: Supervision         General transfer comment: cues for hand placement to stand from bed x 2 and lower to chair  Ambulation/Gait Ambulation/Gait assistance: Supervision Gait Distance (Feet): 300 Feet Assistive device: Rolling walker (2 wheeled) Gait Pattern/deviations: Step-through pattern;Decreased stride length;Trunk flexed   Gait velocity interpretation: >2.62 ft/sec, indicative of  community ambulatory General Gait Details: cues for posture, scapular depression and looking up   Stairs Stairs: Yes Stairs assistance: Supervision Stair Management: Step to pattern;One rail Right;Sideways Number of Stairs: 3 General stair comments: pt able to perform stairs with use of rail without physical assist, initial cues for sequence   Wheelchair Mobility    Modified Rankin (Stroke Patients Only)       Balance Overall balance assessment: Mild deficits observed, not formally tested                                          Cognition Arousal/Alertness: Awake/alert Behavior During Therapy: WFL for tasks assessed/performed Overall Cognitive Status: Within Functional Limits for tasks assessed                                        Exercises Total Joint Exercises Heel Slides: AROM;Right;Supine;15 reps Hip ABduction/ADduction: AROM;Right;Standing;15 reps Knee Flexion: AROM;Right;Standing;15 reps Marching in Standing: AROM;Right;Standing;15 reps Standing Hip Extension: AROM;Right;Standing;15 reps    General Comments        Pertinent Vitals/Pain Pain Assessment: No/denies pain    Home Living                      Prior Function            PT Goals (current goals can now be found in the care plan section) Progress  towards PT goals: Progressing toward goals    Frequency    7X/week      PT Plan Current plan remains appropriate    Co-evaluation              AM-PAC PT "6 Clicks" Mobility   Outcome Measure  Help needed turning from your back to your side while in a flat bed without using bedrails?: A Little Help needed moving from lying on your back to sitting on the side of a flat bed without using bedrails?: A Little Help needed moving to and from a bed to a chair (including a wheelchair)?: A Little Help needed standing up from a chair using your arms (e.g., wheelchair or bedside chair)?: A Little Help  needed to walk in hospital room?: A Little Help needed climbing 3-5 steps with a railing? : A Little 6 Click Score: 18    End of Session   Activity Tolerance: Patient tolerated treatment well Patient left: in chair;with call bell/phone within reach Nurse Communication: Mobility status PT Visit Diagnosis: Muscle weakness (generalized) (M62.81);Difficulty in walking, not elsewhere classified (R26.2)     Time: 0725-0803 PT Time Calculation (min) (ACUTE ONLY): 38 min  Charges:  $Gait Training: 8-22 mins $Therapeutic Exercise: 8-22 mins $Therapeutic Activity: 8-22 mins                     [address], PT Acute Rehabilitation Services Pager: [phone] Office: [place] 05/19/2019, 9:52 AM

## 2019-05-19 NOTE — Progress Notes (Signed)
Physical Therapy Treatment Patient Details Name: Erik Black MRN: [id number] DOB: 14-Jul-1950 Today's Date: 05/19/2019    History of Present Illness Pt is a 68 y.o. male admitted on 05/11/19 for elective R THA direct anterior approach, WBAT post op.  Pt with significant PMH of HTN, glaucoma, DM, bil RTC repair, bil TKA, bil carpal tunnel release.      PT Comments    Pt in chair from the am session without report of pain or concern for return home. Wife present for this session and educated for pt status of supervision level for mobility and present for HEP. Pt educated for walking program at least 3x/day and continued HEP with intentional mobility not just convenient as pt reports everything at home is setup within a 50'.     Follow Up Recommendations  Follow surgeon's recommendation for DC plan and follow-up therapies;Home health PT     Equipment Recommendations  None recommended by PT    Recommendations for Other Services       Precautions / Restrictions Precautions Precautions: Fall Restrictions RLE Weight Bearing: Weight bearing as tolerated    Mobility  Bed Mobility      General bed mobility comments: pt in chair on arrival and end of session  Transfers Overall transfer level: Needs assistance   Transfers: Sit to/from Stand Sit to Stand: Supervision         General transfer comment: no cues with good hand placement and able to stand from recliner and to/from toilet. Initially attempted [facility] over toilet but seat too short and pt requested removal for elongated seat  Ambulation/Gait Ambulation/Gait assistance: Supervision Gait Distance (Feet): 300 Feet Assistive device: Rolling walker (2 wheeled) Gait Pattern/deviations: Step-through pattern;Decreased stride length;Trunk flexed   Gait velocity interpretation: >2.62 ft/sec, indicative of community ambulatory General Gait Details: cues for posture, scapular depression and looking  up   Stairs    Wheelchair Mobility    Modified Rankin (Stroke Patients Only)       Balance Overall balance assessment: Mild deficits observed, not formally tested                                          Cognition Arousal/Alertness: Awake/alert Behavior During Therapy: WFL for tasks assessed/performed Overall Cognitive Status: Within Functional Limits for tasks assessed                                        Exercises Total Joint Exercises  Hip ABduction/ADduction: AROM;Right;Standing;10 reps Long Arc Quad: AROM;10 reps;Both;Seated Knee Flexion: AROM;Right;Standing;10 reps Marching in Standing: AROM;Right;Standing;10 reps Standing Hip Extension: AROM;Right;Standing;10 reps    General Comments        Pertinent Vitals/Pain Pain Assessment: No/denies pain    Home Living                      Prior Function            PT Goals (current goals can now be found in the care plan section) Progress towards PT goals: Progressing toward goals    Frequency    7X/week      PT Plan Current plan remains appropriate    Co-evaluation              AM-PAC PT "6 Clicks" Mobility   Outcome Measure  Help needed turning from your back to your side while in a flat bed without using bedrails?: A Little Help needed moving from lying on your back to sitting on the side of a flat bed without using bedrails?: A Little Help needed moving to and from a bed to a chair (including a wheelchair)?: A Little Help needed standing up from a chair using your arms (e.g., wheelchair or bedside chair)?: A Little Help needed to walk in hospital room?: A Little Help needed climbing 3-5 steps with a railing? : A Little 6 Click Score: 18    End of Session   Activity Tolerance: Patient tolerated treatment well Patient left: in chair;with call bell/phone within reach;with family/visitor present Nurse Communication: Mobility status PT Visit  Diagnosis: Muscle weakness (generalized) (M62.81);Difficulty in walking, not elsewhere classified (R26.2)     Time: 8119-1478 PT Time Calculation (min) (ACUTE ONLY): 20 min  Charges:  $Gait Training: 8-22 mins $Therapeutic Exercise: 8-22 mins $Therapeutic Activity: 8-22 mins                     Kavan Devan P, PT Acute Rehabilitation Services Pager: [phone] Office: [phone]    Larsen Dungan B Kurt Hoffmeier 05/19/2019, 1:18 PM

## 2019-05-19 NOTE — Discharge Instructions (Signed)
° °INSTRUCTIONS AFTER JOINT REPLACEMENT  ° °o Remove items at home which could result in a fall. This includes throw rugs or furniture in walking pathways °o ICE to the affected joint every three hours while awake for 30 minutes at a time, for at least the first 3-5 days, and then as needed for pain and swelling.  Continue to use ice for pain and swelling. You may notice swelling that will progress down to the foot and ankle.  This is normal after surgery.  Elevate your leg when you are not up walking on it.   °o Continue to use the breathing machine you got in the hospital (incentive spirometer) which will help keep your temperature down.  It is common for your temperature to cycle up and down following surgery, especially at night when you are not up moving around and exerting yourself.  The breathing machine keeps your lungs expanded and your temperature down. ° ° °DIET:  As you were doing prior to hospitalization, we recommend a well-balanced diet. ° °DRESSING / WOUND CARE / SHOWERING ° °You may change your surgical dressing 7 days after surgery.  Then change the dressing every day with sterile gauze.  Please use good hand washing techniques before changing the dressing.  Do not use any lotions or creams on the incision until instructed by your surgeon.  You may shower while you have the surgical dressing which is waterproof.  After removal of surgical dressing, you must cover the incision when showering. ° °ACTIVITY ° °o Increase activity slowly as tolerated, but follow the weight bearing instructions below.   °o No driving for 6 weeks or until further direction given by your physician.  You cannot drive while taking narcotics.  °o No lifting or carrying greater than 10 lbs. until further directed by your surgeon. °o Avoid periods of inactivity such as sitting longer than an hour when not asleep. This helps prevent blood clots.  °o You may return to work once you are authorized by your doctor.  ° ° ° °WEIGHT  BEARING  ° °Weight bearing as tolerated with assist device (walker, cane, etc) as directed, use it as long as suggested by your surgeon or therapist, typically at least 4-6 weeks. ° ° °EXERCISES ° °Results after joint replacement surgery are often greatly improved when you follow the exercise, range of motion and muscle strengthening exercises prescribed by your doctor. Safety measures are also important to protect the joint from further injury. Any time any of these exercises cause you to have increased pain or swelling, decrease what you are doing until you are comfortable again and then slowly increase them. If you have problems or questions, call your caregiver or physical therapist for advice.  ° °Rehabilitation is important following a joint replacement. After just a few days of immobilization, the muscles of the leg can become weakened and shrink (atrophy).  These exercises are designed to build up the tone and strength of the thigh and leg muscles and to improve motion. Often times heat used for twenty to thirty minutes before working out will loosen up your tissues and help with improving the range of motion but do not use heat for the first two weeks following surgery (sometimes heat can increase post-operative swelling).  ° °These exercises can be done on a training (exercise) mat, on the floor, on a table or on a bed. Use whatever works the best and is most comfortable for you.    Use music or television   while you are exercising so that the exercises are a pleasant break in your day. This will make your life better with the exercises acting as a break in your routine that you can look forward to.   Perform all exercises about fifteen times, three times per day or as directed.  You should exercise both the operative leg and the other leg as well. ° °Exercises include: °  °• Quad Sets - Tighten up the muscle on the front of the thigh (Quad) and hold for 5-10 seconds.   °• Straight Leg Raises - With your  knee straight (if you were given a brace, keep it on), lift the leg to 60 degrees, hold for 3 seconds, and slowly lower the leg.  Perform this exercise against resistance later as your leg gets stronger.  °• Leg Slides: Lying on your back, slowly slide your foot toward your buttocks, bending your knee up off the floor (only go as far as is comfortable). Then slowly slide your foot back down until your leg is flat on the floor again.  °• Angel Wings: Lying on your back spread your legs to the side as far apart as you can without causing discomfort.  °• Hamstring Strength:  Lying on your back, push your heel against the floor with your leg straight by tightening up the muscles of your buttocks.  Repeat, but this time bend your knee to a comfortable angle, and push your heel against the floor.  You may put a pillow under the heel to make it more comfortable if necessary.  ° °A rehabilitation program following joint replacement surgery can speed recovery and prevent re-injury in the future due to weakened muscles. Contact your doctor or a physical therapist for more information on knee rehabilitation.  ° ° °CONSTIPATION ° °Constipation is defined medically as fewer than three stools per week and severe constipation as less than one stool per week.  Even if you have a regular bowel pattern at home, your normal regimen is likely to be disrupted due to multiple reasons following surgery.  Combination of anesthesia, postoperative narcotics, change in appetite and fluid intake all can affect your bowels.  ° °YOU MUST use at least one of the following options; they are listed in order of increasing strength to get the job done.  They are all available over the counter, and you may need to use some, POSSIBLY even all of these options:   ° °Drink plenty of fluids (prune juice may be helpful) and high fiber foods °Colace 100 mg by mouth twice a day  °Senokot for constipation as directed and as needed Dulcolax (bisacodyl), take  with full glass of water  °Miralax (polyethylene glycol) once or twice a day as needed. ° °If you have tried all these things and are unable to have a bowel movement in the first 3-4 days after surgery call either your surgeon or your primary doctor.   ° °If you experience loose stools or diarrhea, hold the medications until you stool forms back up.  If your symptoms do not get better within 1 week or if they get worse, check with your doctor.  If you experience "the worst abdominal pain ever" or develop nausea or vomiting, please contact the office immediately for further recommendations for treatment. ° ° °ITCHING:  If you experience itching with your medications, try taking only a single pain pill, or even half a pain pill at a time.  You can also use Benadryl over the   counter for itching or also to help with sleep.   TED HOSE STOCKINGS:  Use stockings on both legs until for at least 2 weeks or as directed by physician office. They may be removed at night for sleeping.  MEDICATIONS:  See your medication summary on the After Visit Summary that nursing will review with you.  You may have some home medications which will be placed on hold until you complete the course of blood thinner medication.  It is important for you to complete the blood thinner medication as prescribed.  PRECAUTIONS:  If you experience chest pain or shortness of breath - call 911 immediately for transfer to the hospital emergency department.   If you develop a fever greater that 101 F, purulent drainage from wound, increased redness or drainage from wound, foul odor from the wound/dressing, or calf pain - CONTACT YOUR SURGEON.                                                   FOLLOW-UP APPOINTMENTS:  If you do not already have a post-op appointment, please call the office for an appointment to be seen by your surgeon.  Guidelines for how soon to be seen are listed in your After Visit Summary, but are typically between 1-4 weeks  after surgery.  OTHER INSTRUCTIONS:   Knee Replacement:  Do not place pillow under knee, focus on keeping the knee straight while resting. CPM instructions: 0-90 degrees, 2 hours in the morning, 2 hours in the afternoon, and 2 hours in the evening. Place foam block, curve side up under heel at all times except when in CPM or when walking.  DO NOT modify, tear, cut, or change the foam block in any way.  MAKE SURE YOU:   Understand these instructions.   Get help right away if you are not doing well or get worse.    Thank you for letting us be a part of your medical care team.  It is a privilege we respect greatly.  We hope these instructions will help you stay on track for a fast and full recovery!    Home Health  [facility] [phone] Physical therapy

## 2019-05-19 NOTE — Plan of Care (Signed)
  Problem: Health Behavior/Discharge Planning: Goal: Ability to manage health-related needs will improve Outcome: Progressing   Problem: Activity: Goal: Risk for activity intolerance will decrease Outcome: Progressing   Problem: Coping: Goal: Level of anxiety will decrease Outcome: Progressing   Problem: Safety: Goal: Ability to remain free from injury will improve Outcome: Progressing   

## 2019-05-19 NOTE — Progress Notes (Signed)
Subjective: 8 Days Post-Op Procedure(s) (LRB): RIGHT TOTAL HIP ARTHROPLASTY ANTERIOR APPROACH (Right) Patient reports pain as mild.  Has continued to do well.  Insurance still has not approved SNF.  Objective: Vital signs in last 24 hours: Temp:  [97.7 F (36.5 C)-98 F (36.7 C)] 98 F (36.7 C) (11/17 0302) Pulse Rate:  [71-80] 71 (11/17 0302) Resp:  [17-19] 17 (11/16 1459) BP: (112-132)/(60-76) 117/62 (11/17 0302) SpO2:  [95 %-97 %] 95 % (11/17 0302)  Intake/Output from previous day: 11/16 0701 - 11/17 0700 In: 600 [P.O.:600] Out: 950 [Urine:950] Intake/Output this shift: No intake/output data recorded.  No results for input(s): HGB in the last 72 hours. No results for input(s): WBC, RBC, HCT, PLT in the last 72 hours. No results for input(s): NA, K, CL, CO2, BUN, CREATININE, GLUCOSE, CALCIUM in the last 72 hours. No results for input(s): LABPT, INR in the last 72 hours.  Neurologically intact Neurovascular intact Sensation intact distally Intact pulses distally Dorsiflexion/Plantar flexion intact Incision: dressing C/D/I No cellulitis present Compartment soft   Assessment/Plan: 8 Days Post-Op Procedure(s) (LRB): RIGHT TOTAL HIP ARTHROPLASTY ANTERIOR APPROACH (Right) Up with therapy- has continued to progress over the past week WBAT RLE D/c dispo pending PT session today- may be candidate to d/c home with hhpt today vs less likely snf at this point as he has continued to progress with PT.  Will speak with PT once finished with am session      Aundra Dubin 05/19/2019, 7:53 AM

## 2019-05-21 ENCOUNTER — Telehealth: Payer: Self-pay | Admitting: Orthopaedic Surgery

## 2019-05-21 DIAGNOSIS — H409 Unspecified glaucoma: Secondary | ICD-10-CM | POA: Diagnosis not present

## 2019-05-21 DIAGNOSIS — G4730 Sleep apnea, unspecified: Secondary | ICD-10-CM | POA: Diagnosis not present

## 2019-05-21 DIAGNOSIS — F419 Anxiety disorder, unspecified: Secondary | ICD-10-CM | POA: Diagnosis not present

## 2019-05-21 DIAGNOSIS — F329 Major depressive disorder, single episode, unspecified: Secondary | ICD-10-CM | POA: Diagnosis not present

## 2019-05-21 DIAGNOSIS — E119 Type 2 diabetes mellitus without complications: Secondary | ICD-10-CM | POA: Diagnosis not present

## 2019-05-21 DIAGNOSIS — K219 Gastro-esophageal reflux disease without esophagitis: Secondary | ICD-10-CM | POA: Diagnosis not present

## 2019-05-21 DIAGNOSIS — M1990 Unspecified osteoarthritis, unspecified site: Secondary | ICD-10-CM | POA: Diagnosis not present

## 2019-05-21 DIAGNOSIS — I10 Essential (primary) hypertension: Secondary | ICD-10-CM | POA: Diagnosis not present

## 2019-05-21 DIAGNOSIS — Z471 Aftercare following joint replacement surgery: Secondary | ICD-10-CM | POA: Diagnosis not present

## 2019-05-21 NOTE — Telephone Encounter (Signed)
[facility] needing verbal order for  2x week for 3 week  [phone]

## 2019-05-21 NOTE — Telephone Encounter (Signed)
Called to approve orders. LMOM

## 2019-05-26 ENCOUNTER — Telehealth: Payer: Self-pay

## 2019-05-26 ENCOUNTER — Ambulatory Visit (INDEPENDENT_AMBULATORY_CARE_PROVIDER_SITE_OTHER): Payer: No Typology Code available for payment source | Admitting: Physician Assistant

## 2019-05-26 ENCOUNTER — Other Ambulatory Visit: Payer: Self-pay

## 2019-05-26 ENCOUNTER — Encounter: Payer: Self-pay | Admitting: Orthopaedic Surgery

## 2019-05-26 ENCOUNTER — Ambulatory Visit (HOSPITAL_COMMUNITY)
Admission: RE | Admit: 2019-05-26 | Discharge: 2019-05-26 | Disposition: A | Discharge status: Home / Self Care | Payer: No Typology Code available for payment source | Source: Ambulatory Visit | Attending: Orthopaedic Surgery | Admitting: Orthopaedic Surgery

## 2019-05-26 DIAGNOSIS — I10 Essential (primary) hypertension: Secondary | ICD-10-CM | POA: Diagnosis not present

## 2019-05-26 DIAGNOSIS — E119 Type 2 diabetes mellitus without complications: Secondary | ICD-10-CM | POA: Diagnosis not present

## 2019-05-26 DIAGNOSIS — Z96641 Presence of right artificial hip joint: Secondary | ICD-10-CM

## 2019-05-26 DIAGNOSIS — Z471 Aftercare following joint replacement surgery: Secondary | ICD-10-CM | POA: Diagnosis not present

## 2019-05-26 DIAGNOSIS — F329 Major depressive disorder, single episode, unspecified: Secondary | ICD-10-CM | POA: Diagnosis not present

## 2019-05-26 DIAGNOSIS — K219 Gastro-esophageal reflux disease without esophagitis: Secondary | ICD-10-CM | POA: Diagnosis not present

## 2019-05-26 DIAGNOSIS — G4730 Sleep apnea, unspecified: Secondary | ICD-10-CM | POA: Diagnosis not present

## 2019-05-26 DIAGNOSIS — H409 Unspecified glaucoma: Secondary | ICD-10-CM | POA: Diagnosis not present

## 2019-05-26 DIAGNOSIS — M1990 Unspecified osteoarthritis, unspecified site: Secondary | ICD-10-CM | POA: Diagnosis not present

## 2019-05-26 DIAGNOSIS — F419 Anxiety disorder, unspecified: Secondary | ICD-10-CM | POA: Diagnosis not present

## 2019-05-26 MED ORDER — OXYCODONE-ACETAMINOPHEN 5-325 MG PO TABS: 1.0000 | ORAL_TABLET | Freq: Three times a day (TID) | ORAL | 0 refills | Status: DC | PRN

## 2019-05-26 MED ORDER — CEPHALEXIN 500 MG PO CAPS: 500.0000 mg | ORAL_CAPSULE | Freq: Four times a day (QID) | ORAL | 0 refills | Status: DC

## 2019-05-26 NOTE — Progress Notes (Signed)
Post-Op Visit Note   Patient: Erik Black           Date of Birth: 09-16-1950           MRN: [id number] Visit Date: 05/26/2019 PCP: [place]:  Chief Complaint:  Chief Complaint  Patient presents with  . Right Hip - Pain   Visit Diagnoses: No diagnosis found.  Plan: Patient is a pleasant 68 year old gentleman who presents our clinic today 15 days status post right anterior total hip replacement, date of surgery 05/11/2019.  He has been doing well.  He has been taking his chronic Belbuca as well as oxycodone for breakthrough pain.  Doing well with that.  No fevers or chills.  He has been working well with home health physical therapy and is able to ambulate unassisted.  He does use a walker out in public.  He does have quite a bit of swelling to the right lower extremity and has noticed blisters to his lower leg.  He has been elevating for swelling.  Examination of his right hip wound reveals a well healing surgical incision with nylon sutures in place.  No evidence of infection or cellulitis.  He does have moderate swelling to the lower leg with evidence of blisters.  Mild tenderness to the calf.  Negative Homans.  He is neurovascularly intact distally.  At this point, sutures were removed and Steri-Strips applied.  I will start him on antibiotics for his fracture blisters.  We will also apply Xeroform to these today.  Will obtain an ultrasound of the right lower extremity to rule out DVT.  I have refilled his oxycodone.  I will provide him with an outpatient physical therapy prescription should he need one once home health physical therapy finishes.  He will follow-up with [place] in 4 weeks time for repeat evaluation and AP pelvis lateral right hip x-rays.  Follow-Up Instructions: Return in about 4 weeks (around 06/23/2019).   Orders:  No orders of the defined types were placed in this encounter.  Meds ordered this encounter  Medications  . cephALEXin  (KEFLEX) 500 MG capsule    Sig: Take 1 capsule (500 mg total) by mouth 4 (four) times daily.    Dispense:  56 capsule    Refill:  0  . oxyCODONE-acetaminophen (PERCOCET) 5-325 MG tablet    Sig: Take 1-2 tablets by mouth 3 (three) times daily as needed for severe pain.    Dispense:  30 tablet    Refill:  0    Imaging: No new imaging  PMFS History: Patient Active Problem List   Diagnosis Date Noted  . Status post total replacement of right hip 05/11/2019  . Primary osteoarthritis of right hip 04/16/2019   Past Medical History:  Diagnosis Date  . Anxiety   . Arthritis   . Depression   . Diabetes mellitus without complication ([place])   . GERD (gastroesophageal reflux disease)   . Glaucoma    both eyes  . Hypertension   . Sleep apnea    uses Cpap nightly    History reviewed. No pertinent family history.  Past Surgical History:  Procedure Laterality Date  . CARPAL TUNNEL RELEASE Bilateral   . COLONOSCOPY    . EYE SURGERY Bilateral    cataract surgery with lens implants  . KNEE ARTHROPLASTY Right   . KNEE ARTHROPLASTY Left   . KNEE ARTHROSCOPY Right   . KNEE ARTHROSCOPY Left   . MULTIPLE TOOTH EXTRACTIONS    .  ROTATOR CUFF REPAIR Right   . ROTATOR CUFF REPAIR W/ DISTAL CLAVICLE EXCISION Left   . TOTAL HIP ARTHROPLASTY Right 05/11/2019   Procedure: RIGHT TOTAL HIP ARTHROPLASTY ANTERIOR APPROACH;  Surgeon: Tarry Kos, MD;  Location: [facility];  Service: Orthopedics;  Laterality: Right;  Marland Kitchen VASECTOMY     Social History   Occupational History  . Not on file  Tobacco Use  . Smoking status: Current Some Day Smoker  . Smokeless tobacco: Never Used  . Tobacco comment: smokes a pack a month  Substance and Sexual Activity  . Alcohol use: Not Currently  . Drug use: Not Currently  . Sexual activity: Not on file

## 2019-05-26 NOTE — Addendum Note (Signed)
Addended by: Precious Bard on: 05/26/2019 11:18 AM   Modules accepted: Orders

## 2019-05-26 NOTE — Progress Notes (Signed)
RLE venous duplex       has been completed. Preliminary results can be found under CV proc through chart review. June Leap, BS, RDMS, RVT  Called results to Dr. Phoebe Sharps office

## 2019-05-26 NOTE — Telephone Encounter (Signed)
FYI-  Per Vascular, patient is Negative for LE DVT.

## 2019-05-29 DIAGNOSIS — K219 Gastro-esophageal reflux disease without esophagitis: Secondary | ICD-10-CM | POA: Diagnosis not present

## 2019-05-29 DIAGNOSIS — E119 Type 2 diabetes mellitus without complications: Secondary | ICD-10-CM | POA: Diagnosis not present

## 2019-05-29 DIAGNOSIS — F329 Major depressive disorder, single episode, unspecified: Secondary | ICD-10-CM | POA: Diagnosis not present

## 2019-05-29 DIAGNOSIS — Z471 Aftercare following joint replacement surgery: Secondary | ICD-10-CM | POA: Diagnosis not present

## 2019-05-29 DIAGNOSIS — H409 Unspecified glaucoma: Secondary | ICD-10-CM | POA: Diagnosis not present

## 2019-05-29 DIAGNOSIS — I10 Essential (primary) hypertension: Secondary | ICD-10-CM | POA: Diagnosis not present

## 2019-05-29 DIAGNOSIS — M1990 Unspecified osteoarthritis, unspecified site: Secondary | ICD-10-CM | POA: Diagnosis not present

## 2019-05-29 DIAGNOSIS — G4730 Sleep apnea, unspecified: Secondary | ICD-10-CM | POA: Diagnosis not present

## 2019-05-29 DIAGNOSIS — F419 Anxiety disorder, unspecified: Secondary | ICD-10-CM | POA: Diagnosis not present

## 2019-06-02 DIAGNOSIS — I10 Essential (primary) hypertension: Secondary | ICD-10-CM | POA: Diagnosis not present

## 2019-06-02 DIAGNOSIS — G4730 Sleep apnea, unspecified: Secondary | ICD-10-CM | POA: Diagnosis not present

## 2019-06-02 DIAGNOSIS — F419 Anxiety disorder, unspecified: Secondary | ICD-10-CM | POA: Diagnosis not present

## 2019-06-02 DIAGNOSIS — E119 Type 2 diabetes mellitus without complications: Secondary | ICD-10-CM | POA: Diagnosis not present

## 2019-06-02 DIAGNOSIS — H409 Unspecified glaucoma: Secondary | ICD-10-CM | POA: Diagnosis not present

## 2019-06-02 DIAGNOSIS — F329 Major depressive disorder, single episode, unspecified: Secondary | ICD-10-CM | POA: Diagnosis not present

## 2019-06-02 DIAGNOSIS — K219 Gastro-esophageal reflux disease without esophagitis: Secondary | ICD-10-CM | POA: Diagnosis not present

## 2019-06-02 DIAGNOSIS — Z471 Aftercare following joint replacement surgery: Secondary | ICD-10-CM | POA: Diagnosis not present

## 2019-06-02 DIAGNOSIS — M1990 Unspecified osteoarthritis, unspecified site: Secondary | ICD-10-CM | POA: Diagnosis not present

## 2019-06-03 DIAGNOSIS — F419 Anxiety disorder, unspecified: Secondary | ICD-10-CM | POA: Diagnosis not present

## 2019-06-03 DIAGNOSIS — M1990 Unspecified osteoarthritis, unspecified site: Secondary | ICD-10-CM | POA: Diagnosis not present

## 2019-06-03 DIAGNOSIS — K219 Gastro-esophageal reflux disease without esophagitis: Secondary | ICD-10-CM | POA: Diagnosis not present

## 2019-06-03 DIAGNOSIS — I10 Essential (primary) hypertension: Secondary | ICD-10-CM | POA: Diagnosis not present

## 2019-06-03 DIAGNOSIS — Z471 Aftercare following joint replacement surgery: Secondary | ICD-10-CM | POA: Diagnosis not present

## 2019-06-03 DIAGNOSIS — E119 Type 2 diabetes mellitus without complications: Secondary | ICD-10-CM | POA: Diagnosis not present

## 2019-06-03 DIAGNOSIS — H409 Unspecified glaucoma: Secondary | ICD-10-CM | POA: Diagnosis not present

## 2019-06-03 DIAGNOSIS — G4730 Sleep apnea, unspecified: Secondary | ICD-10-CM | POA: Diagnosis not present

## 2019-06-03 DIAGNOSIS — F329 Major depressive disorder, single episode, unspecified: Secondary | ICD-10-CM | POA: Diagnosis not present

## 2019-06-08 DIAGNOSIS — H409 Unspecified glaucoma: Secondary | ICD-10-CM | POA: Diagnosis not present

## 2019-06-08 DIAGNOSIS — I10 Essential (primary) hypertension: Secondary | ICD-10-CM | POA: Diagnosis not present

## 2019-06-08 DIAGNOSIS — E119 Type 2 diabetes mellitus without complications: Secondary | ICD-10-CM | POA: Diagnosis not present

## 2019-06-08 DIAGNOSIS — F419 Anxiety disorder, unspecified: Secondary | ICD-10-CM | POA: Diagnosis not present

## 2019-06-08 DIAGNOSIS — M1990 Unspecified osteoarthritis, unspecified site: Secondary | ICD-10-CM | POA: Diagnosis not present

## 2019-06-08 DIAGNOSIS — F329 Major depressive disorder, single episode, unspecified: Secondary | ICD-10-CM | POA: Diagnosis not present

## 2019-06-08 DIAGNOSIS — G4730 Sleep apnea, unspecified: Secondary | ICD-10-CM | POA: Diagnosis not present

## 2019-06-08 DIAGNOSIS — K219 Gastro-esophageal reflux disease without esophagitis: Secondary | ICD-10-CM | POA: Diagnosis not present

## 2019-06-08 DIAGNOSIS — Z471 Aftercare following joint replacement surgery: Secondary | ICD-10-CM | POA: Diagnosis not present

## 2019-06-10 DIAGNOSIS — F419 Anxiety disorder, unspecified: Secondary | ICD-10-CM | POA: Diagnosis not present

## 2019-06-10 DIAGNOSIS — I10 Essential (primary) hypertension: Secondary | ICD-10-CM | POA: Diagnosis not present

## 2019-06-10 DIAGNOSIS — M1990 Unspecified osteoarthritis, unspecified site: Secondary | ICD-10-CM | POA: Diagnosis not present

## 2019-06-10 DIAGNOSIS — K219 Gastro-esophageal reflux disease without esophagitis: Secondary | ICD-10-CM | POA: Diagnosis not present

## 2019-06-10 DIAGNOSIS — H409 Unspecified glaucoma: Secondary | ICD-10-CM | POA: Diagnosis not present

## 2019-06-10 DIAGNOSIS — G4730 Sleep apnea, unspecified: Secondary | ICD-10-CM | POA: Diagnosis not present

## 2019-06-10 DIAGNOSIS — E119 Type 2 diabetes mellitus without complications: Secondary | ICD-10-CM | POA: Diagnosis not present

## 2019-06-10 DIAGNOSIS — Z471 Aftercare following joint replacement surgery: Secondary | ICD-10-CM | POA: Diagnosis not present

## 2019-06-10 DIAGNOSIS — F329 Major depressive disorder, single episode, unspecified: Secondary | ICD-10-CM | POA: Diagnosis not present

## 2019-06-23 ENCOUNTER — Ambulatory Visit: Payer: No Typology Code available for payment source | Admitting: Orthopaedic Surgery

## 2019-06-23 ENCOUNTER — Other Ambulatory Visit: Payer: Self-pay | Admitting: Orthopaedic Surgery

## 2019-06-23 NOTE — Telephone Encounter (Signed)
Please advise 

## 2019-07-09 ENCOUNTER — Other Ambulatory Visit: Payer: Self-pay

## 2019-07-09 ENCOUNTER — Ambulatory Visit (INDEPENDENT_AMBULATORY_CARE_PROVIDER_SITE_OTHER): Payer: No Typology Code available for payment source

## 2019-07-09 ENCOUNTER — Ambulatory Visit (INDEPENDENT_AMBULATORY_CARE_PROVIDER_SITE_OTHER): Payer: No Typology Code available for payment source | Admitting: Orthopaedic Surgery

## 2019-07-09 ENCOUNTER — Encounter: Payer: Self-pay | Admitting: Orthopaedic Surgery

## 2019-07-09 DIAGNOSIS — Z96641 Presence of right artificial hip joint: Secondary | ICD-10-CM

## 2019-07-09 NOTE — Progress Notes (Signed)
   Post-Op Visit Note   Patient: Erik Black           Date of Birth: [birth year]           MRN: [id number] Visit Date: 07/09/2019 [facility]   Assessment & Plan:  Chief Complaint:  Chief Complaint  Patient presents with  . Right Hip - Pain, Follow-up   Visit Diagnoses:  1. Status post total replacement of right hip     Plan: Patient is a pleasant 69 year old gentleman who presents to our clinic today 8 weeks status post right anterior total hip replacement 05/11/2019.  He has been doing excellent.  He has finished home health physical therapy.  He is ambulating without any assistance.  He is no longer requiring narcotic pain medication from the pain clinic.  Overall, he is feeling great.  Examination of the right hip reveals a fully healed surgical scar without complication.  Calves are soft and nontender.  He is neurovascular intact distally.  At this point, he will continue with activity as tolerated.  Dental prophylaxis reinforced.  Follow-up with [place] in 4 weeks time for repeat evaluation.  Call with concerns or questions.  Follow-Up Instructions: Return in about 4 weeks (around 08/06/2019).   Orders:  Orders Placed This Encounter  Procedures  . XR HIP UNILAT W OR W/O PELVIS 2-3 VIEWS RIGHT   No orders of the defined types were placed in this encounter.   Imaging: XR HIP UNILAT W OR W/O PELVIS 2-3 VIEWS RIGHT  Result Date: 07/09/2019 Well-seated prosthesis without complication   PMFS History: Patient Active Problem List   Diagnosis Date Noted  . Status post total replacement of right hip 05/11/2019  . Primary osteoarthritis of right hip 04/16/2019   Past Medical History:  Diagnosis Date  . Anxiety   . Arthritis   . Depression   . Diabetes mellitus without complication ([facility])   . GERD (gastroesophageal reflux disease)   . Glaucoma    both eyes  . Hypertension   . Sleep apnea    uses Cpap nightly    History reviewed. No pertinent family history.    Past Surgical History:  Procedure Laterality Date  . CARPAL TUNNEL RELEASE Bilateral   . COLONOSCOPY    . EYE SURGERY Bilateral    cataract surgery with lens implants  . KNEE ARTHROPLASTY Right   . KNEE ARTHROPLASTY Left   . KNEE ARTHROSCOPY Right   . KNEE ARTHROSCOPY Left   . MULTIPLE TOOTH EXTRACTIONS    . ROTATOR CUFF REPAIR Right   . ROTATOR CUFF REPAIR W/ DISTAL CLAVICLE EXCISION Left   . TOTAL HIP ARTHROPLASTY Right 05/11/2019   Procedure: RIGHT TOTAL HIP ARTHROPLASTY ANTERIOR APPROACH;  Surgeon: Tarry Kos, MD;  Location: [facility];  Service: Orthopedics;  Laterality: Right;  Marland Kitchen VASECTOMY     Social History   Occupational History  . Not on file  Tobacco Use  . Smoking status: Current Some Day Smoker  . Smokeless tobacco: Never Used  . Tobacco comment: smokes a pack a month  Substance and Sexual Activity  . Alcohol use: Not Currently  . Drug use: Not Currently  . Sexual activity: Not on file

## 2019-08-11 ENCOUNTER — Ambulatory Visit: Payer: No Typology Code available for payment source | Admitting: Orthopaedic Surgery

## 2019-08-14 ENCOUNTER — Ambulatory Visit: Payer: No Typology Code available for payment source | Admitting: Orthopaedic Surgery

## 2019-09-01 ENCOUNTER — Other Ambulatory Visit: Payer: Self-pay | Admitting: Orthopaedic Surgery

## 2019-09-02 NOTE — Telephone Encounter (Signed)
Only needs for 6 weeks after surgery

## 2019-10-22 ENCOUNTER — Ambulatory Visit (INDEPENDENT_AMBULATORY_CARE_PROVIDER_SITE_OTHER): Payer: No Typology Code available for payment source | Admitting: Orthopaedic Surgery

## 2019-10-22 ENCOUNTER — Encounter: Payer: Self-pay | Admitting: Orthopaedic Surgery

## 2019-10-22 ENCOUNTER — Ambulatory Visit (INDEPENDENT_AMBULATORY_CARE_PROVIDER_SITE_OTHER): Payer: No Typology Code available for payment source

## 2019-10-22 ENCOUNTER — Other Ambulatory Visit: Payer: Self-pay

## 2019-10-22 VITALS — Ht 72.0 in | Wt 290.0 lb

## 2019-10-22 DIAGNOSIS — M1612 Unilateral primary osteoarthritis, left hip: Secondary | ICD-10-CM | POA: Diagnosis not present

## 2019-10-22 DIAGNOSIS — Z96641 Presence of right artificial hip joint: Secondary | ICD-10-CM | POA: Diagnosis not present

## 2019-10-22 NOTE — Progress Notes (Signed)
Office Visit Note   Patient: Tayt Moyers           Date of Birth: July 14, 1950           MRN: [id number] Visit Date: 10/22/2019              Requested by: [facility] [address] [place],  [place] [id number] PCP: [facility]: Visit Diagnoses:  1. Primary osteoarthritis of left hip   2. Status post total replacement of right hip     Plan: Impression is 6 months status post right total hip replacement.  He has done well from the surgery.  From the left hip standpoint he has failed conservative treatment and wishes to proceed with hip replacement in the near future.  Risk benefits rehab recovery reviewed in detail again.  We look forward to treating him in the operating room.  Follow-Up Instructions: Return for 2-week postop visit.   Orders:  Orders Placed This Encounter  Procedures  . XR HIP UNILAT W OR W/O PELVIS 2-3 VIEWS LEFT   No orders of the defined types were placed in this encounter.     Procedures: No procedures performed   Clinical Data: No additional findings.   Subjective: Chief Complaint  Patient presents with  . Left Hip - Pain    Mr. Henery is a 69 year old gentleman who comes in as a referral from the [place] for treatment of end-stage left hip DJD.  I did his right total hip replacement back in November 2020 from which he did well.  He was very satisfied with the outcome.  His left hip is bothering him severely and would like to have this replaced as well.  No changes in medical history.   Review of Systems  Constitutional: Negative.   All other systems reviewed and are negative.    Objective: Vital Signs: Ht 6' (1.829 m)   Wt 290 lb (131.5 kg)   BMI 39.33 kg/m   Physical Exam Vitals and nursing note reviewed.  Constitutional:      Appearance: He is well-developed.  Pulmonary:     Effort: Pulmonary effort is normal.  Abdominal:     Palpations: Abdomen is soft.  Skin:    General: Skin is warm.    Neurological:     Mental Status: He is alert and oriented to person, place, and time.  Psychiatric:        Behavior: Behavior normal.        Thought Content: Thought content normal.        Judgment: Judgment normal.     Ortho Exam Left hip shows limited range of motion with pain with internal and external rotation.  Positive Stinchfield sign. Right hip exam is benign. Specialty Comments:  No specialty comments available.  Imaging: XR HIP UNILAT W OR W/O PELVIS 2-3 VIEWS LEFT  Result Date: 10/22/2019 Stable total hip replacement without complication.  End-stage left hip DJD.    PMFS History: Patient Active Problem List   Diagnosis Date Noted  . Primary osteoarthritis of left hip 10/22/2019  . Status post total replacement of right hip 05/11/2019  . Primary osteoarthritis of right hip 04/16/2019   Past Medical History:  Diagnosis Date  . Anxiety   . Arthritis   . Depression   . Diabetes mellitus without complication ([place])   . GERD (gastroesophageal reflux disease)   . Glaucoma    both eyes  . Hypertension   . Sleep apnea  uses Cpap nightly    History reviewed. No pertinent family history.  Past Surgical History:  Procedure Laterality Date  . CARPAL TUNNEL RELEASE Bilateral   . COLONOSCOPY    . EYE SURGERY Bilateral    cataract surgery with lens implants  . KNEE ARTHROPLASTY Right   . KNEE ARTHROPLASTY Left   . KNEE ARTHROSCOPY Right   . KNEE ARTHROSCOPY Left   . MULTIPLE TOOTH EXTRACTIONS    . ROTATOR CUFF REPAIR Right   . ROTATOR CUFF REPAIR W/ DISTAL CLAVICLE EXCISION Left   . TOTAL HIP ARTHROPLASTY Right 05/11/2019   Procedure: RIGHT TOTAL HIP ARTHROPLASTY ANTERIOR APPROACH;  Surgeon: Tarry Kos, MD;  Location: [facility];  Service: Orthopedics;  Laterality: Right;  Marland Kitchen VASECTOMY     Social History   Occupational History  . Not on file  Tobacco Use  . Smoking status: Current Some Day Smoker  . Smokeless tobacco: Never Used  . Tobacco comment: smokes  a pack a month  Substance and Sexual Activity  . Alcohol use: Not Currently  . Drug use: Not Currently  . Sexual activity: Not on file

## 2019-10-23 ENCOUNTER — Other Ambulatory Visit: Payer: Self-pay

## 2019-11-17 ENCOUNTER — Telehealth: Payer: Self-pay

## 2019-11-17 ENCOUNTER — Other Ambulatory Visit: Payer: Self-pay | Admitting: Family

## 2019-11-17 ENCOUNTER — Telehealth: Payer: Self-pay | Admitting: Orthopaedic Surgery

## 2019-11-17 NOTE — Telephone Encounter (Signed)
Faxed form requesting  HHPT Approval to the [place].- Patient is scheduled to have SU 11/23/19.   Jake nurse from [place] called regarding this. Called back twice no answer. Could not leave VM.  CB: [phone]

## 2019-11-17 NOTE — Telephone Encounter (Signed)
refaxed request for [facility] care services to [facility] with notes per their request, [phone]

## 2019-11-18 NOTE — Progress Notes (Signed)
[facility] [address], [place] - [address] [place] [address] Phone: [phone] Fax: [phone]      Your procedure is scheduled on Monday, Nov 23, 2019.  Report to [facility] Main Entrance "A" at 5:30 A.M., and check in at the Admitting office.  Call this number if you have problems the morning of surgery:  [phone]  Call [phone] if you have any questions prior to your surgery date Monday-Friday 8am-4pm    Remember:  Do not eat after midnight the night before your surgery  You may drink clear liquids until 4:30 AM the morning of your surgery.   Clear liquids allowed are: Water, Non-Citrus Juices (without pulp), Carbonated Beverages, Clear Tea, Black Coffee Only, and Gatorade   Enhanced Recovery after Surgery for Orthopedics Enhanced Recovery after Surgery is a protocol used to improve the stress on your body and your recovery after surgery.  Patient Instructions  . The day of surgery (if you have diabetes): o  o Drink ONE (1) Gatorade 2 (G2) as directed. o This drink was given to you during your hospital  pre-op appointment visit.  o The pre-op nurse will instruct you on the time to drink the   Gatorade 2 (G2) depending on your surgery time. (4:30 AM) o Color of the Gatorade may vary. Red is not allowed. o Nothing else to drink after completing the  Gatorade 2 (G2).         If you have questions, please contact your surgeon's office.     Take these medicines the morning of surgery with A SIP OF WATER:  amLODipine (NORVASC) doxazosin (CARDURA) DULoxetine (CYMBALTA) metoprolol tartrate (LOPRESSOR) omeprazole (PRILOSEC) SIMBRINZA 1-0.2 % SUSP timolol (TIMOPTIC-XR) 0.5 % topiramate (TOPAMAX) diphenhydrAMINE (BENADRYL) - if needed fluticasone (FLONASE) -  If needed  Follow your surgeon's instructions on when to stop Aspirin.  If no instructions were given by your surgeon then you will need to call the office to get those  instructions.     As of today, STOP taking any Aspirin containing products, Aleve, Naproxen, Ibuprofen, Motrin, Advil, Goody's, BC's, all herbal medications, fish oil, and all vitamins.    HOW TO MANAGE YOUR DIABETES BEFORE AND AFTER SURGERY  Why is it important to control my blood sugar before and after surgery? . Improving blood sugar levels before and after surgery helps healing and can limit problems. . A way of improving blood sugar control is eating a healthy diet by: o  Eating less sugar and carbohydrates o  Increasing activity/exercise o  Talking with your doctor about reaching your blood sugar goals . High blood sugars (greater than 180 mg/dL) can raise your risk of infections and slow your recovery, so you will need to focus on controlling your diabetes during the weeks before surgery. . Make sure that the doctor who takes care of your diabetes knows about your planned surgery including the date and location.  How do I manage my blood sugar before surgery? . Check your blood sugar at least 4 times a day, starting 2 days before surgery, to make sure that the level is not too high or low. . Check your blood sugar the morning of your surgery when you wake up and every 2 hours until you get to the Short Stay unit. o If your blood sugar is less than 70 mg/dL, you will need to treat for low blood sugar: - Do not take insulin. - Treat a low blood sugar (less  than 70 mg/dL) with  cup of clear juice (cranberry or apple), 4 glucose tablets, OR glucose gel. - Recheck blood sugar in 15 minutes after treatment (to make sure it is greater than 70 mg/dL). If your blood sugar is not greater than 70 mg/dL on recheck, call [phone] for further instructions. . Report your blood sugar to the short stay nurse when you get to Short Stay.  . If you are admitted to the hospital after surgery: o Your blood sugar will be checked by the staff and you will probably be given insulin after surgery  (instead of oral diabetes medicines) to make sure you have good blood sugar levels. o The goal for blood sugar control after surgery is 80-180 mg/dL.                     Do not wear jewelry.            Do not wear lotions, powders, colognes, or deodorant.            Men may shave face and neck.            Do not bring valuables to the hospital.            [facility] is not responsible for any belongings or valuables.  Do NOT Smoke (Tobacco/Vapping) or drink Alcohol 24 hours prior to your procedure If you use a CPAP at night, you may bring all equipment for your overnight stay.   Contacts, glasses, dentures or bridgework may not be worn into surgery.      For patients admitted to the hospital, discharge time will be determined by your treatment team.   Patients discharged the day of surgery will not be allowed to drive home, and someone needs to stay with them for 24 hours.    Special instructions:   [facility]- Preparing For Surgery  Before surgery, you can play an important role. Because skin is not sterile, your skin needs to be as free of germs as possible. You can reduce the number of germs on your skin by washing with CHG (chlorahexidine gluconate) Soap before surgery.  CHG is an antiseptic cleaner which kills germs and bonds with the skin to continue killing germs even after washing.    Oral Hygiene is also important to reduce your risk of infection.  Remember - BRUSH YOUR TEETH THE MORNING OF SURGERY WITH YOUR REGULAR TOOTHPASTE  Please do not use if you have an allergy to CHG or antibacterial soaps. If your skin becomes reddened/irritated stop using the CHG.  Do not shave (including legs and underarms) for at least 48 hours prior to first CHG shower. It is OK to shave your face.  Please follow these instructions carefully.   1. Shower the NIGHT BEFORE SURGERY and the MORNING OF SURGERY with CHG Soap.   2. If you chose to wash your hair, wash your hair first as usual with  your normal shampoo.  3. After you shampoo, rinse your hair and body thoroughly to remove the shampoo.  4. Use CHG as you would any other liquid soap. You can apply CHG directly to the skin and wash gently with a scrungie or a clean washcloth.   5. Apply the CHG Soap to your body ONLY FROM THE NECK DOWN.  Do not use on open wounds or open sores. Avoid contact with your eyes, ears, mouth and genitals (private parts). Wash Face and genitals (private parts)  with your normal  soap.   6. Wash thoroughly, paying special attention to the area where your surgery will be performed.  7. Thoroughly rinse your body with warm water from the neck down.  8. DO NOT shower/wash with your normal soap after using and rinsing off the CHG Soap.  9. Pat yourself dry with a CLEAN TOWEL.  10. Wear CLEAN PAJAMAS to bed the night before surgery, wear comfortable clothes the morning of surgery  11. Place CLEAN SHEETS on your bed the night of your first shower and DO NOT SLEEP WITH PETS.   Day of Surgery:   Do not apply any deodorants/lotions.  Please wear clean clothes to the hospital/surgery center.   Remember to brush your teeth WITH YOUR REGULAR TOOTHPASTE.   Please read over the following fact sheets that you were given.

## 2019-11-19 ENCOUNTER — Encounter (HOSPITAL_COMMUNITY)
Admission: RE | Admit: 2019-11-19 | Discharge: 2019-11-19 | Disposition: A | Discharge status: Home / Self Care | Payer: No Typology Code available for payment source | Source: Ambulatory Visit | Attending: Orthopaedic Surgery | Admitting: Orthopaedic Surgery

## 2019-11-19 ENCOUNTER — Telehealth: Payer: Self-pay | Admitting: Orthopaedic Surgery

## 2019-11-19 ENCOUNTER — Other Ambulatory Visit (HOSPITAL_COMMUNITY)
Admission: RE | Admit: 2019-11-19 | Discharge: 2019-11-19 | Disposition: A | Discharge status: Home / Self Care | Payer: No Typology Code available for payment source | Source: Ambulatory Visit | Attending: Orthopaedic Surgery | Admitting: Orthopaedic Surgery

## 2019-11-19 ENCOUNTER — Other Ambulatory Visit: Payer: Self-pay

## 2019-11-19 ENCOUNTER — Encounter (HOSPITAL_COMMUNITY): Payer: Self-pay

## 2019-11-19 DIAGNOSIS — G4730 Sleep apnea, unspecified: Secondary | ICD-10-CM | POA: Insufficient documentation

## 2019-11-19 DIAGNOSIS — F329 Major depressive disorder, single episode, unspecified: Secondary | ICD-10-CM | POA: Diagnosis not present

## 2019-11-19 DIAGNOSIS — Z7982 Long term (current) use of aspirin: Secondary | ICD-10-CM | POA: Diagnosis not present

## 2019-11-19 DIAGNOSIS — Z01818 Encounter for other preprocedural examination: Secondary | ICD-10-CM | POA: Diagnosis present

## 2019-11-19 DIAGNOSIS — F17200 Nicotine dependence, unspecified, uncomplicated: Secondary | ICD-10-CM | POA: Insufficient documentation

## 2019-11-19 DIAGNOSIS — Z20822 Contact with and (suspected) exposure to covid-19: Secondary | ICD-10-CM | POA: Insufficient documentation

## 2019-11-19 DIAGNOSIS — M1612 Unilateral primary osteoarthritis, left hip: Secondary | ICD-10-CM | POA: Insufficient documentation

## 2019-11-19 DIAGNOSIS — E119 Type 2 diabetes mellitus without complications: Secondary | ICD-10-CM | POA: Diagnosis not present

## 2019-11-19 DIAGNOSIS — I10 Essential (primary) hypertension: Secondary | ICD-10-CM | POA: Diagnosis not present

## 2019-11-19 DIAGNOSIS — Z79899 Other long term (current) drug therapy: Secondary | ICD-10-CM | POA: Insufficient documentation

## 2019-11-19 DIAGNOSIS — K219 Gastro-esophageal reflux disease without esophagitis: Secondary | ICD-10-CM | POA: Insufficient documentation

## 2019-11-19 DIAGNOSIS — Z96641 Presence of right artificial hip joint: Secondary | ICD-10-CM | POA: Diagnosis not present

## 2019-11-19 DIAGNOSIS — H409 Unspecified glaucoma: Secondary | ICD-10-CM | POA: Diagnosis not present

## 2019-11-19 DIAGNOSIS — Z96653 Presence of artificial knee joint, bilateral: Secondary | ICD-10-CM | POA: Insufficient documentation

## 2019-11-19 DIAGNOSIS — F419 Anxiety disorder, unspecified: Secondary | ICD-10-CM | POA: Insufficient documentation

## 2019-11-19 LAB — PROTIME-INR
INR: 1 (ref 0.8–1.2)
Prothrombin Time: 13 seconds (ref 11.4–15.2)

## 2019-11-19 LAB — URINALYSIS, ROUTINE W REFLEX MICROSCOPIC
Bilirubin Urine: NEGATIVE
Glucose, UA: NEGATIVE mg/dL
Hgb urine dipstick: NEGATIVE
Ketones, ur: NEGATIVE mg/dL
Leukocytes,Ua: NEGATIVE
Nitrite: NEGATIVE
Protein, ur: NEGATIVE mg/dL
Specific Gravity, Urine: 1.016 (ref 1.005–1.030)
pH: 7 (ref 5.0–8.0)

## 2019-11-19 LAB — COMPREHENSIVE METABOLIC PANEL
ALT: 18 U/L (ref 0–44)
AST: 18 U/L (ref 15–41)
Albumin: 3.8 g/dL (ref 3.5–5.0)
Alkaline Phosphatase: 117 U/L (ref 38–126)
Anion gap: 10 (ref 5–15)
BUN: 18 mg/dL (ref 8–23)
CO2: 23 mmol/L (ref 22–32)
Calcium: 9.6 mg/dL (ref 8.9–10.3)
Chloride: 107 mmol/L (ref 98–111)
Creatinine, Ser: 1.3 mg/dL — ABNORMAL HIGH (ref 0.61–1.24)
GFR calc Af Amer: >60 mL/min (ref >60)
GFR calc non Af Amer: 56 mL/min — ABNORMAL LOW (ref >60)
Glucose, Bld: 179 mg/dL — ABNORMAL HIGH (ref 70–99)
Potassium: 3.1 mmol/L — ABNORMAL LOW (ref 3.5–5.1)
Sodium: 140 mmol/L (ref 135–145)
Total Bilirubin: 1 mg/dL (ref 0.3–1.2)
Total Protein: 6.8 g/dL (ref 6.5–8.1)

## 2019-11-19 LAB — HEMOGLOBIN A1C
Hgb A1c MFr Bld: 7 % — ABNORMAL HIGH (ref 4.8–5.6)
Mean Plasma Glucose: 154.2 mg/dL

## 2019-11-19 LAB — GLUCOSE, CAPILLARY: Glucose-Capillary: 157 mg/dL — ABNORMAL HIGH (ref 70–99)

## 2019-11-19 LAB — CBC
HCT: 45.2 % (ref 39.0–52.0)
Hemoglobin: 14.7 g/dL (ref 13.0–17.0)
MCH: 29.3 pg (ref 26.0–34.0)
MCHC: 32.5 g/dL (ref 30.0–36.0)
MCV: 90.2 fL (ref 80.0–100.0)
Platelets: 151 10*3/uL (ref 150–400)
RBC: 5.01 MIL/uL (ref 4.22–5.81)
RDW: 13.9 % (ref 11.5–15.5)
WBC: 6.7 10*3/uL (ref 4.0–10.5)
nRBC: 0 % (ref 0.0–0.2)

## 2019-11-19 LAB — SURGICAL PCR SCREEN
MRSA, PCR: NEGATIVE
Staphylococcus aureus: NEGATIVE

## 2019-11-19 LAB — APTT: aPTT: 30 seconds (ref 24–36)

## 2019-11-19 NOTE — Telephone Encounter (Signed)
[facility] [place] [facility] [place]. He asked for a call back concerning HHPT for the patient. The number to contact Leta Jungling is [phone]

## 2019-11-19 NOTE — Progress Notes (Signed)
[id number] - [place], [place] - [address] [place] [address] Phone: [phone] Fax: [phone]      Your procedure is scheduled on Monday, Nov 23, 2019.  Report to [facility] Main Entrance "A" at 5:30 A.M., and check in at the Admitting office.  Call this number if you have problems the morning of surgery:  [phone]  Call [phone] if you have any questions prior to your surgery date Monday-Friday 8am-4pm    Remember:  Do not eat after midnight the night before your surgery  You may drink clear liquids until 4:15 AM the morning of your surgery.   Clear liquids allowed are: Water, Non-Citrus Juices (without pulp), Carbonated Beverages, Clear Tea, Black Coffee Only, and Gatorade   Enhanced Recovery after Surgery for Orthopedics Enhanced Recovery after Surgery is a protocol used to improve the stress on your body and your recovery after surgery.  Patient Instructions  . The day of surgery (if you have diabetes): o  o Drink ONE (1) Gatorade 2 (G2) as directed. o This drink was given to you during your hospital  pre-op appointment visit.  o The pre-op nurse will instruct you on the time to drink the   Gatorade 2 (G2) depending on your surgery time. (4:15 AM) o Color of the Gatorade may vary. Red is not allowed. o Nothing else to drink after completing the  Gatorade 2 (G2).         If you have questions, please contact your surgeon's office.     Take these medicines the morning of surgery with A SIP OF WATER:  amLODipine (NORVASC) doxazosin (CARDURA) DULoxetine (CYMBALTA) metoprolol tartrate (LOPRESSOR) omeprazole (PRILOSEC) SIMBRINZA 1-0.2 % SUSP timolol (TIMOPTIC-XR) 0.5 % topiramate (TOPAMAX) diphenhydrAMINE (BENADRYL) - if needed fluticasone (FLONASE) -  If needed  Follow your surgeon's instructions on when to stop Aspirin.  If no instructions were given by your surgeon then you will need to call the office to get those  instructions.  STOP taking your Phentermine (Adipex-P).   As of today, STOP taking any Aspirin containing products, Aleve, Naproxen, Ibuprofen, Motrin, Advil, Goody's, BC's, all herbal medications, fish oil, and all vitamins.    HOW TO MANAGE YOUR DIABETES BEFORE AND AFTER SURGERY  Why is it important to control my blood sugar before and after surgery? . Improving blood sugar levels before and after surgery helps healing and can limit problems. . A way of improving blood sugar control is eating a healthy diet by: o  Eating less sugar and carbohydrates o  Increasing activity/exercise o  Talking with your doctor about reaching your blood sugar goals . High blood sugars (greater than 180 mg/dL) can raise your risk of infections and slow your recovery, so you will need to focus on controlling your diabetes during the weeks before surgery. . Make sure that the doctor who takes care of your diabetes knows about your planned surgery including the date and location.  How do I manage my blood sugar before surgery? . Check your blood sugar at least 4 times a day, starting 2 days before surgery, to make sure that the level is not too high or low. . Check your blood sugar the morning of your surgery when you wake up and every 2 hours until you get to the Short Stay unit. o If your blood sugar is less than 70 mg/dL, you will need to treat for low blood sugar: - Do not take insulin. - Treat a  low blood sugar (less than 70 mg/dL) with  cup of clear juice (cranberry or apple), 4 glucose tablets, OR glucose gel. - Recheck blood sugar in 15 minutes after treatment (to make sure it is greater than 70 mg/dL). If your blood sugar is not greater than 70 mg/dL on recheck, call [phone] for further instructions. . Report your blood sugar to the short stay nurse when you get to Short Stay.  . If you are admitted to the hospital after surgery: o Your blood sugar will be checked by the staff and you will  probably be given insulin after surgery (instead of oral diabetes medicines) to make sure you have good blood sugar levels. o The goal for blood sugar control after surgery is 80-180 mg/dL.                     Do not wear jewelry.            Do not wear lotions, powders, colognes, or deodorant.            Men may shave face and neck.            Do not bring valuables to the hospital.            [facility] is not responsible for any belongings or valuables.  Do NOT Smoke (Tobacco/Vapping) or drink Alcohol 24 hours prior to your procedure If you use a CPAP at night, you may bring all equipment for your overnight stay.   Contacts, glasses, dentures or bridgework may not be worn into surgery.      For patients admitted to the hospital, discharge time will be determined by your treatment team.   Patients discharged the day of surgery will not be allowed to drive home, and someone needs to stay with them for 24 hours.    Special instructions:   [facility]- Preparing For Surgery  Before surgery, you can play an important role. Because skin is not sterile, your skin needs to be as free of germs as possible. You can reduce the number of germs on your skin by washing with CHG (chlorahexidine gluconate) Soap before surgery.  CHG is an antiseptic cleaner which kills germs and bonds with the skin to continue killing germs even after washing.    Oral Hygiene is also important to reduce your risk of infection.  Remember - BRUSH YOUR TEETH THE MORNING OF SURGERY WITH YOUR REGULAR TOOTHPASTE  Please do not use if you have an allergy to CHG or antibacterial soaps. If your skin becomes reddened/irritated stop using the CHG.  Do not shave (including legs and underarms) for at least 48 hours prior to first CHG shower. It is OK to shave your face.  Please follow these instructions carefully.   1. Shower the NIGHT BEFORE SURGERY and the MORNING OF SURGERY with CHG Soap.   2. If you chose to wash your  hair, wash your hair first as usual with your normal shampoo.  3. After you shampoo, rinse your hair and body thoroughly to remove the shampoo.  4. Use CHG as you would any other liquid soap. You can apply CHG directly to the skin and wash gently with a scrungie or a clean washcloth.   5. Apply the CHG Soap to your body ONLY FROM THE NECK DOWN.  Do not use on open wounds or open sores. Avoid contact with your eyes, ears, mouth and genitals (private parts). Wash Face and genitals (private parts)  with your normal soap.   6. Wash thoroughly, paying special attention to the area where your surgery will be performed.  7. Thoroughly rinse your body with warm water from the neck down.  8. DO NOT shower/wash with your normal soap after using and rinsing off the CHG Soap.  9. Pat yourself dry with a CLEAN TOWEL.  10. Wear CLEAN PAJAMAS to bed the night before surgery, wear comfortable clothes the morning of surgery  11. Place CLEAN SHEETS on your bed the night of your first shower and DO NOT SLEEP WITH PETS.   Day of Surgery:   Do not apply any deodorants/lotions.  Please wear clean clothes to the hospital/surgery center.   Remember to brush your teeth WITH YOUR REGULAR TOOTHPASTE.   Please read over the following fact sheets that you were given.

## 2019-11-19 NOTE — Progress Notes (Signed)
[facility]: [facility] [place]  EKG: 05/07/2019 CXR: ECHO: 08/2017--media tab, under correspondence from 05-20-2019 Stress Test: 07/2017--media tab, under correspondence from 05-20-2019 Cardiac Cath:  [facility] [place]--requested last office note/labs per pt request  Fasting Blood Sugar- 90-110 Checks Blood Sugar via wireless monitor, wears continuously  Patient denies shortness of breath, fever, cough, and chest pain at PAT appointment.  Patient verbalized understanding of instructions provided today at the PAT appointment.  Patient asked to review instructions at home and day of surgery.

## 2019-11-19 NOTE — Telephone Encounter (Signed)
LVM for Jake to return call.

## 2019-11-20 LAB — SARS CORONAVIRUS 2 (TAT 6-24 HRS): SARS Coronavirus 2: NEGATIVE

## 2019-11-20 MED ORDER — TRANEXAMIC ACID 1000 MG/10ML IV SOLN
2000.0000 mg | INTRAVENOUS | Status: DC
  Filled 2019-11-20 (×2): qty 20

## 2019-11-20 MED ORDER — DEXTROSE 5 % IV SOLN
3.0000 g | INTRAVENOUS | Status: AC
  Administered 2019-11-23: 3 g via INTRAVENOUS
  Filled 2019-11-20: qty 3
  Filled 2019-11-20: qty 3000

## 2019-11-20 MED ORDER — BUPIVACAINE LIPOSOME 1.3 % IJ SUSP
20.0000 mL | Freq: Once | INTRAMUSCULAR | Status: DC
  Filled 2019-11-20: qty 20

## 2019-11-20 NOTE — Anesthesia Preprocedure Evaluation (Addendum)
Anesthesia Evaluation  Patient identified by MRN, date of birth, ID band Patient awake    Reviewed: Allergy & Precautions, NPO status , Patient's Chart, lab work & pertinent test results, reviewed documented beta blocker date and time   Airway Mallampati: II  TM Distance: >3 FB Neck ROM: Full    Dental  (+) Missing,    Pulmonary sleep apnea and Continuous Positive Airway Pressure Ventilation , Current Smoker,    Pulmonary exam normal breath sounds clear to auscultation       Cardiovascular hypertension, Pt. on medications and Pt. on home beta blockers Normal cardiovascular exam Rhythm:Regular Rate:Normal  EKG- NSR, PVC's    Neuro/Psych PSYCHIATRIC DISORDERS Anxiety Depression Glaucoma  negative neurological ROS     GI/Hepatic Neg liver ROS, GERD  Medicated and Controlled,  Endo/Other  diabetes, Well Controlled, Type 2, Oral Hypoglycemic AgentsMorbid obesityHyperlipidemia Hypokalemia    Renal/GU negative Renal ROS  negative genitourinary   Musculoskeletal  (+) Arthritis , Osteoarthritis,    Abdominal (+) + obese,   Peds  Hematology negative hematology ROS (+)   Anesthesia Other Findings   Reproductive/Obstetrics                           Anesthesia Physical Anesthesia Plan  ASA: III  Anesthesia Plan: Spinal   Post-op Pain Management:    Induction:   PONV Risk Score and Plan: 1 and Propofol infusion, Ondansetron and Treatment may vary due to age or medical condition  Airway Management Planned: Natural Airway and Simple Face Mask  Additional Equipment:   Intra-op Plan:   Post-operative Plan:   Informed Consent: I have reviewed the patients History and Physical, chart, labs and discussed the procedure including the risks, benefits and alternatives for the proposed anesthesia with the patient or authorized representative who has indicated his/her understanding and acceptance.      Dental advisory given  Plan Discussed with: CRNA and Surgeon  Anesthesia Plan Comments: (PAT note written 11/20/2019 by Shonna Chock, PA-C. )      Anesthesia Quick Evaluation

## 2019-11-20 NOTE — Progress Notes (Signed)
Anesthesia Chart Review:  Case: [id number] Date/Time: 11/23/19 0700   Procedure: LEFT TOTAL HIP ARTHROPLASTY ANTERIOR APPROACH (Left Hip) - RNFA   Anesthesia type: Spinal   Pre-op diagnosis: left hip degenerative joint disease   [facility] / [facility]   Surgeons: Tarry Kos, MD      DISCUSSION: Patient is a 69 year old male scheduled for the above procedure.  He is s/p right THA on 05/11/19.  History includes smoking, HTN, OSA (CPAP), DM2, GERD, anxiety, depression, glaucoma.  BMI is consistent with morbid obesity.  Patient had a cardiology evaluation at the [facility] in January 2019 for coronary calcifications seen on CT. He had a nuclear stress that was nonischemic.  Echo showed EF 50 to 55%, no significant valve abnormalities.  Last Phentermine dose 11/19/19. He will contact Dr. Warren Danes office regarding perioperative ASA instructions.    Preoperative COVID-19 test negative on 11/19/2019.  Anesthesia team to evaluate on the day of surgery.   VS: BP (!) 155/96   Pulse 86   Temp 36.8 C (Oral)   Resp 18   Ht 5\' 10"  (1.778 m)   Wt 132.1 kg   SpO2 100%   BMI 41.80 kg/m   [facility]    LABS: Labs reviewed: Acceptable for surgery. (all labs ordered are listed, but only abnormal results are displayed)  Labs Reviewed  GLUCOSE, CAPILLARY - Abnormal; Notable for the following components:      Result Value   Glucose-Capillary 157 (*)    All other components within normal limits  COMPREHENSIVE METABOLIC PANEL - Abnormal; Notable for the following components:   Potassium 3.1 (*)    Glucose, Bld 179 (*)    Creatinine, Ser 1.30 (*)    GFR calc non Af Amer 56 (*)    All other components within normal limits  URINALYSIS, ROUTINE W REFLEX MICROSCOPIC - Abnormal; Notable for the following components:   APPearance HAZY (*)    All other components within normal limits  HEMOGLOBIN A1C - Abnormal; Notable for the following components:   Hgb A1c MFr Bld 7.0 (*)    All  other components within normal limits  SURGICAL PCR SCREEN  APTT  CBC  PROTIME-INR     IMAGES: CXR 05/07/19: IMPRESSION: 1. No acute abnormality of the lungs. Mild eventration of the right hemidiaphragm. 2. cardiomegaly.   EKG: EKG 05/07/19: Sinus rhythm with occasional Premature ventricular complexes. Rate 70.   CV:  Echo 08/02/2017 [facility], copy scanned under Media tab, Correspondence 05/11/19): 1.  The left ventricle is normal in size. 2.  There is mild to moderate concentric left ventricular hypertrophy. 3.  Left ventricular systolic function is normal. 4.  Ejection fraction is 50 to 55%. 5.  There is mild aortic sclerosis. 6.  There is mild mitral valve thickening. 7.  There is trace mitral regurgitation. 8.  The tricuspid valve is normal in structure and function. 9.  There is no pericardial effusion.  Nuclear stress 07/31/2017 [facility], copy scanned under Media tab, Correspondence 05/11/19): Impression: 1.  No reversible ischemia.  Inferior wall attenuation artifact due to bowel uptake on rest images only. 2.  Normal wall motion.  EF is 47% but gating was poor.  Dilated LV.  Recommend correlating with echocardiogram.    Past Medical History:  Diagnosis Date  . Anxiety   . Arthritis   . Depression   . Diabetes mellitus without complication (HCC)   . GERD (gastroesophageal reflux disease)   . Glaucoma  both eyes  . Hypertension   . Sleep apnea    uses Cpap nightly    Past Surgical History:  Procedure Laterality Date  . CARPAL TUNNEL RELEASE Bilateral   . COLONOSCOPY    . EYE SURGERY Bilateral    cataract surgery with lens implants  . KNEE ARTHROPLASTY Right   . KNEE ARTHROPLASTY Left   . KNEE ARTHROSCOPY Right   . KNEE ARTHROSCOPY Left   . MULTIPLE TOOTH EXTRACTIONS    . ROTATOR CUFF REPAIR Right   . ROTATOR CUFF REPAIR W/ DISTAL CLAVICLE EXCISION Left   . TOTAL HIP ARTHROPLASTY Right 05/11/2019   Procedure: RIGHT TOTAL HIP ARTHROPLASTY ANTERIOR  APPROACH;  Surgeon: Leandrew Koyanagi, MD;  Location: [place];  Service: Orthopedics;  Laterality: Right;  Marland Kitchen VASECTOMY      MEDICATIONS: . amLODipine (NORVASC) 10 MG tablet  . aspirin EC 81 MG tablet  . atorvastatin (LIPITOR) 40 MG tablet  . Cholecalciferol (VITAMIN D) 50 MCG (2000 UT) tablet  . diphenhydrAMINE (BENADRYL) 25 MG tablet  . doxazosin (CARDURA) 8 MG tablet  . DULoxetine (CYMBALTA) 60 MG capsule  . fluticasone (FLONASE) 50 MCG/ACT nasal spray  . hydrochlorothiazide (HYDRODIURIL) 25 MG tablet  . latanoprost (XALATAN) 0.005 % ophthalmic solution  . losartan (COZAAR) 100 MG tablet  . metoprolol tartrate (LOPRESSOR) 50 MG tablet  . Multiple Vitamins-Minerals (MULTIVITAMIN WITH MINERALS) tablet  . Omega-3 1000 MG CAPS  . omeprazole (PRILOSEC) 40 MG capsule  . phentermine (ADIPEX-P) 37.5 MG tablet  . potassium chloride SA (KLOR-CON) 20 MEQ tablet  . Semaglutide,0.25 or 0.5MG /DOS, (OZEMPIC, 0.25 OR 0.5 MG/DOSE,) 2 MG/1.5ML SOPN  . SIMBRINZA 1-0.2 % SUSP  . timolol (TIMOPTIC-XR) 0.5 % ophthalmic gel-forming  . topiramate (TOPAMAX) 50 MG tablet   No current facility-administered medications for this encounter.   Derrill Memo ON 11/23/2019] bupivacaine liposome (EXPAREL) 1.3 % injection 266 mg  . [START ON 11/23/2019] ceFAZolin (ANCEF) 3 g in dextrose 5 % 50 mL IVPB  . [START ON 11/23/2019] tranexamic acid (CYKLOKAPRON) 2,000 mg in sodium chloride 0.9 % 50 mL Topical Application    Myra Gianotti, PA-C Surgical Short Stay/Anesthesiology [facility] Phone [phone] [facility] Phone [phone] 11/20/2019 9:51 AM

## 2019-11-20 NOTE — Telephone Encounter (Signed)
S/w Jake this morning. Informed him I did not see where a specific location was noted only where information was faxed for [facility] approval for HHPT. I did inform him patient is scheduled for surgery and 11/23/19 and usually the case manager will set up before discharge.

## 2019-11-22 ENCOUNTER — Encounter (HOSPITAL_COMMUNITY): Payer: Self-pay | Admitting: Orthopaedic Surgery

## 2019-11-22 ENCOUNTER — Other Ambulatory Visit: Payer: Self-pay | Admitting: Orthopaedic Surgery

## 2019-11-22 MED ORDER — SULFAMETHOXAZOLE-TRIMETHOPRIM 800-160 MG PO TABS
1.0000 | ORAL_TABLET | Freq: Two times a day (BID) | ORAL | 0 refills | Status: DC
Start: 2019-11-22 — End: 2019-12-08

## 2019-11-22 MED ORDER — ONDANSETRON HCL 4 MG PO TABS: 4.0000 mg | ORAL_TABLET | Freq: Three times a day (TID) | ORAL | 0 refills | Status: DC | PRN

## 2019-11-22 MED ORDER — OXYCODONE-ACETAMINOPHEN 5-325 MG PO TABS: 1.0000 | ORAL_TABLET | Freq: Three times a day (TID) | ORAL | 0 refills | Status: DC | PRN

## 2019-11-22 MED ORDER — ASPIRIN EC 81 MG PO TBEC: 81.0000 mg | DELAYED_RELEASE_TABLET | Freq: Two times a day (BID) | ORAL | 0 refills | Status: DC

## 2019-11-22 MED ORDER — METHOCARBAMOL 750 MG PO TABS
750.0000 mg | ORAL_TABLET | Freq: Two times a day (BID) | ORAL | 3 refills | Status: DC | PRN
Start: 2019-11-22 — End: 2022-02-12

## 2019-11-22 NOTE — Discharge Instructions (Signed)

## 2019-11-23 ENCOUNTER — Inpatient Hospital Stay (HOSPITAL_COMMUNITY): Payer: No Typology Code available for payment source

## 2019-11-23 ENCOUNTER — Inpatient Hospital Stay (HOSPITAL_COMMUNITY)
Admission: RE | Admit: 2019-11-23 | Discharge: 2019-11-24 | DRG: 470 | Disposition: A | Discharge status: Home / Self Care | Payer: No Typology Code available for payment source | Attending: Orthopaedic Surgery | Admitting: Orthopaedic Surgery

## 2019-11-23 ENCOUNTER — Other Ambulatory Visit: Payer: Self-pay

## 2019-11-23 ENCOUNTER — Inpatient Hospital Stay (HOSPITAL_COMMUNITY): Payer: No Typology Code available for payment source | Admitting: Anesthesiology

## 2019-11-23 ENCOUNTER — Observation Stay (HOSPITAL_COMMUNITY): Payer: No Typology Code available for payment source

## 2019-11-23 ENCOUNTER — Inpatient Hospital Stay (HOSPITAL_COMMUNITY): Payer: No Typology Code available for payment source | Admitting: Vascular Surgery

## 2019-11-23 ENCOUNTER — Encounter (HOSPITAL_COMMUNITY)
Admission: RE | Disposition: A | Discharge status: Home / Self Care | Payer: Self-pay | Source: Home / Self Care | Attending: Orthopaedic Surgery

## 2019-11-23 ENCOUNTER — Encounter (HOSPITAL_COMMUNITY): Payer: Self-pay | Admitting: Orthopaedic Surgery

## 2019-11-23 DIAGNOSIS — E876 Hypokalemia: Secondary | ICD-10-CM | POA: Diagnosis present

## 2019-11-23 DIAGNOSIS — E119 Type 2 diabetes mellitus without complications: Secondary | ICD-10-CM | POA: Diagnosis present

## 2019-11-23 DIAGNOSIS — Z6841 Body Mass Index (BMI) 40.0 and over, adult: Secondary | ICD-10-CM

## 2019-11-23 DIAGNOSIS — F17210 Nicotine dependence, cigarettes, uncomplicated: Secondary | ICD-10-CM | POA: Diagnosis present

## 2019-11-23 DIAGNOSIS — Z96653 Presence of artificial knee joint, bilateral: Secondary | ICD-10-CM | POA: Diagnosis present

## 2019-11-23 DIAGNOSIS — G4730 Sleep apnea, unspecified: Secondary | ICD-10-CM | POA: Diagnosis present

## 2019-11-23 DIAGNOSIS — F329 Major depressive disorder, single episode, unspecified: Secondary | ICD-10-CM | POA: Diagnosis present

## 2019-11-23 DIAGNOSIS — I10 Essential (primary) hypertension: Secondary | ICD-10-CM | POA: Diagnosis present

## 2019-11-23 DIAGNOSIS — E785 Hyperlipidemia, unspecified: Secondary | ICD-10-CM | POA: Diagnosis present

## 2019-11-23 DIAGNOSIS — Z96649 Presence of unspecified artificial hip joint: Secondary | ICD-10-CM

## 2019-11-23 DIAGNOSIS — Z7982 Long term (current) use of aspirin: Secondary | ICD-10-CM | POA: Diagnosis not present

## 2019-11-23 DIAGNOSIS — Z7984 Long term (current) use of oral hypoglycemic drugs: Secondary | ICD-10-CM

## 2019-11-23 DIAGNOSIS — K219 Gastro-esophageal reflux disease without esophagitis: Secondary | ICD-10-CM | POA: Diagnosis present

## 2019-11-23 DIAGNOSIS — Z96642 Presence of left artificial hip joint: Secondary | ICD-10-CM

## 2019-11-23 DIAGNOSIS — M1612 Unilateral primary osteoarthritis, left hip: Principal | ICD-10-CM | POA: Diagnosis present

## 2019-11-23 DIAGNOSIS — H409 Unspecified glaucoma: Secondary | ICD-10-CM | POA: Diagnosis present

## 2019-11-23 DIAGNOSIS — Z419 Encounter for procedure for purposes other than remedying health state, unspecified: Secondary | ICD-10-CM

## 2019-11-23 DIAGNOSIS — D62 Acute posthemorrhagic anemia: Secondary | ICD-10-CM | POA: Diagnosis not present

## 2019-11-23 DIAGNOSIS — F419 Anxiety disorder, unspecified: Secondary | ICD-10-CM | POA: Diagnosis present

## 2019-11-23 DIAGNOSIS — Z79899 Other long term (current) drug therapy: Secondary | ICD-10-CM | POA: Diagnosis not present

## 2019-11-23 HISTORY — PX: TOTAL HIP ARTHROPLASTY: SHX124

## 2019-11-23 LAB — GLUCOSE, CAPILLARY
Glucose-Capillary: 129 mg/dL — ABNORMAL HIGH (ref 70–99)
Glucose-Capillary: 110 mg/dL — ABNORMAL HIGH (ref 70–99)
Glucose-Capillary: 102 mg/dL — ABNORMAL HIGH (ref 70–99)
Glucose-Capillary: 149 mg/dL — ABNORMAL HIGH (ref 70–99)
Glucose-Capillary: 166 mg/dL — ABNORMAL HIGH (ref 70–99)

## 2019-11-23 SURGERY — ARTHROPLASTY, HIP, TOTAL, ANTERIOR APPROACH
Anesthesia: Spinal | Site: Hip | Laterality: Left

## 2019-11-23 MED ORDER — METHOCARBAMOL 1000 MG/10ML IJ SOLN
500.0000 mg | Freq: Four times a day (QID) | INTRAVENOUS | Status: DC | PRN
  Filled 2019-11-23: qty 5

## 2019-11-23 MED ORDER — POVIDONE-IODINE 10 % EX SWAB: 2.0000 "application " | Freq: Once | CUTANEOUS | Status: DC

## 2019-11-23 MED ORDER — PHENYLEPHRINE 40 MCG/ML (10ML) SYRINGE FOR IV PUSH (FOR BLOOD PRESSURE SUPPORT)
PREFILLED_SYRINGE | INTRAVENOUS | Status: AC
  Filled 2019-11-23: qty 10

## 2019-11-23 MED ORDER — GABAPENTIN 300 MG PO CAPS
300.0000 mg | ORAL_CAPSULE | Freq: Three times a day (TID) | ORAL | Status: DC
  Administered 2019-11-23 – 2019-11-24 (×3): 300 mg via ORAL
  Filled 2019-11-23 (×3): qty 1

## 2019-11-23 MED ORDER — ATORVASTATIN CALCIUM 10 MG PO TABS
20.0000 mg | ORAL_TABLET | Freq: Every day | ORAL | Status: DC
  Administered 2019-11-23: 20 mg via ORAL
  Filled 2019-11-23: qty 2

## 2019-11-23 MED ORDER — TOPIRAMATE 25 MG PO TABS
50.0000 mg | ORAL_TABLET | Freq: Two times a day (BID) | ORAL | Status: DC
  Administered 2019-11-23 – 2019-11-24 (×2): 50 mg via ORAL
  Filled 2019-11-23 (×2): qty 2

## 2019-11-23 MED ORDER — OXYCODONE HCL ER 10 MG PO T12A
10.0000 mg | EXTENDED_RELEASE_TABLET | Freq: Two times a day (BID) | ORAL | Status: DC
  Administered 2019-11-23 – 2019-11-24 (×3): 10 mg via ORAL
  Filled 2019-11-23 (×3): qty 1

## 2019-11-23 MED ORDER — PROPOFOL 10 MG/ML IV BOLUS
INTRAVENOUS | Status: AC
  Filled 2019-11-23: qty 20

## 2019-11-23 MED ORDER — ORAL CARE MOUTH RINSE: 15.0000 mL | Freq: Once | OROMUCOSAL | Status: AC

## 2019-11-23 MED ORDER — DOXAZOSIN MESYLATE 4 MG PO TABS
4.0000 mg | ORAL_TABLET | Freq: Two times a day (BID) | ORAL | Status: DC
  Administered 2019-11-23 – 2019-11-24 (×2): 4 mg via ORAL
  Filled 2019-11-23 (×3): qty 1

## 2019-11-23 MED ORDER — ONDANSETRON HCL 4 MG/2ML IJ SOLN: 4.0000 mg | Freq: Once | INTRAMUSCULAR | Status: DC | PRN

## 2019-11-23 MED ORDER — DULOXETINE HCL 60 MG PO CPEP
60.0000 mg | ORAL_CAPSULE | Freq: Every day | ORAL | Status: DC
  Administered 2019-11-23 – 2019-11-24 (×2): 60 mg via ORAL
  Filled 2019-11-23 (×2): qty 1

## 2019-11-23 MED ORDER — OXYCODONE HCL 5 MG PO TABS: 10.0000 mg | ORAL_TABLET | ORAL | Status: DC | PRN

## 2019-11-23 MED ORDER — ONDANSETRON HCL 4 MG/2ML IJ SOLN
INTRAMUSCULAR | Status: AC
  Filled 2019-11-23: qty 2

## 2019-11-23 MED ORDER — BUPIVACAINE HCL 0.25 % IJ SOLN
INTRAMUSCULAR | Status: DC | PRN
Start: 2019-11-23 — End: 2019-11-23
  Administered 2019-11-23: 20 mL

## 2019-11-23 MED ORDER — SORBITOL 70 % SOLN: 30.0000 mL | Freq: Every day | Status: DC | PRN

## 2019-11-23 MED ORDER — SODIUM CHLORIDE 0.9 % IR SOLN
Status: DC | PRN
  Administered 2019-11-23: 3000 mL

## 2019-11-23 MED ORDER — OXYCODONE HCL 5 MG PO TABS: 5.0000 mg | ORAL_TABLET | ORAL | Status: DC | PRN

## 2019-11-23 MED ORDER — HYDROMORPHONE HCL 1 MG/ML IJ SOLN: 0.2500 mg | INTRAMUSCULAR | Status: DC | PRN

## 2019-11-23 MED ORDER — SODIUM CHLORIDE 0.9 % IV SOLN: INTRAVENOUS | Status: DC | PRN

## 2019-11-23 MED ORDER — ACETAMINOPHEN 325 MG PO TABS: 325.0000 mg | ORAL_TABLET | Freq: Four times a day (QID) | ORAL | Status: DC | PRN

## 2019-11-23 MED ORDER — DEXTROSE 5 % IV SOLN
3.0000 g | Freq: Three times a day (TID) | INTRAVENOUS | Status: AC
  Administered 2019-11-23 – 2019-11-24 (×3): 3 g via INTRAVENOUS
  Filled 2019-11-23 (×4): qty 3000

## 2019-11-23 MED ORDER — METOCLOPRAMIDE HCL 5 MG/ML IJ SOLN: 5.0000 mg | Freq: Three times a day (TID) | INTRAMUSCULAR | Status: DC | PRN

## 2019-11-23 MED ORDER — ACETAMINOPHEN 500 MG PO TABS
1000.0000 mg | ORAL_TABLET | Freq: Four times a day (QID) | ORAL | Status: AC
  Administered 2019-11-23 – 2019-11-24 (×4): 1000 mg via ORAL
  Filled 2019-11-23 (×4): qty 2

## 2019-11-23 MED ORDER — FENTANYL CITRATE (PF) 250 MCG/5ML IJ SOLN
INTRAMUSCULAR | Status: AC
  Filled 2019-11-23: qty 5

## 2019-11-23 MED ORDER — METOCLOPRAMIDE HCL 5 MG PO TABS: 5.0000 mg | ORAL_TABLET | Freq: Three times a day (TID) | ORAL | Status: DC | PRN

## 2019-11-23 MED ORDER — POTASSIUM CHLORIDE CRYS ER 20 MEQ PO TBCR
40.0000 meq | EXTENDED_RELEASE_TABLET | Freq: Every day | ORAL | Status: DC
  Administered 2019-11-23: 40 meq via ORAL
  Filled 2019-11-23: qty 2

## 2019-11-23 MED ORDER — PROPOFOL 1000 MG/100ML IV EMUL
INTRAVENOUS | Status: AC
  Filled 2019-11-23: qty 100

## 2019-11-23 MED ORDER — TRANEXAMIC ACID-NACL 1000-0.7 MG/100ML-% IV SOLN
1000.0000 mg | INTRAVENOUS | Status: AC
  Administered 2019-11-23: 1000 mg via INTRAVENOUS
  Filled 2019-11-23: qty 100

## 2019-11-23 MED ORDER — POTASSIUM CHLORIDE CRYS ER 20 MEQ PO TBCR
60.0000 meq | EXTENDED_RELEASE_TABLET | Freq: Every day | ORAL | Status: DC
  Administered 2019-11-23 – 2019-11-24 (×2): 60 meq via ORAL
  Filled 2019-11-23 (×2): qty 3

## 2019-11-23 MED ORDER — DEXAMETHASONE SODIUM PHOSPHATE 10 MG/ML IJ SOLN
10.0000 mg | Freq: Once | INTRAMUSCULAR | Status: AC
  Administered 2019-11-24: 10 mg via INTRAVENOUS
  Filled 2019-11-23: qty 1

## 2019-11-23 MED ORDER — DIPHENHYDRAMINE HCL 12.5 MG/5ML PO ELIX: 25.0000 mg | ORAL_SOLUTION | ORAL | Status: DC | PRN

## 2019-11-23 MED ORDER — MENTHOL 3 MG MT LOZG: 1.0000 | LOZENGE | OROMUCOSAL | Status: DC | PRN

## 2019-11-23 MED ORDER — SEMAGLUTIDE(0.25 OR 0.5MG/DOS) 2 MG/1.5ML ~~LOC~~ SOPN
0.3500 mg | PEN_INJECTOR | SUBCUTANEOUS | Status: DC
  Filled 2019-11-23 (×2): qty 1

## 2019-11-23 MED ORDER — ONDANSETRON HCL 4 MG PO TABS: 4.0000 mg | ORAL_TABLET | Freq: Four times a day (QID) | ORAL | Status: DC | PRN

## 2019-11-23 MED ORDER — TRANEXAMIC ACID 1000 MG/10ML IV SOLN
INTRAVENOUS | Status: DC | PRN
  Administered 2019-11-23: 2000 mg via TOPICAL

## 2019-11-23 MED ORDER — TIMOLOL MALEATE 0.5 % OP SOLG
1.0000 [drp] | Freq: Every day | OPHTHALMIC | Status: DC
  Administered 2019-11-24: 1 [drp] via OPHTHALMIC
  Filled 2019-11-23: qty 5

## 2019-11-23 MED ORDER — KETOROLAC TROMETHAMINE 15 MG/ML IJ SOLN
INTRAMUSCULAR | Status: AC
  Filled 2019-11-23: qty 1

## 2019-11-23 MED ORDER — AMLODIPINE BESYLATE 10 MG PO TABS
10.0000 mg | ORAL_TABLET | Freq: Every day | ORAL | Status: DC
  Administered 2019-11-24: 10 mg via ORAL
  Filled 2019-11-23: qty 1

## 2019-11-23 MED ORDER — SODIUM CHLORIDE 0.9 % IV SOLN: INTRAVENOUS | Status: DC

## 2019-11-23 MED ORDER — LACTATED RINGERS IV SOLN: INTRAVENOUS | Status: DC | PRN

## 2019-11-23 MED ORDER — PHENYLEPHRINE 40 MCG/ML (10ML) SYRINGE FOR IV PUSH (FOR BLOOD PRESSURE SUPPORT)
PREFILLED_SYRINGE | INTRAVENOUS | Status: DC | PRN
  Administered 2019-11-23 (×3): 80 ug via INTRAVENOUS

## 2019-11-23 MED ORDER — 0.9 % SODIUM CHLORIDE (POUR BTL) OPTIME
TOPICAL | Status: DC | PRN
  Administered 2019-11-23: 1000 mL

## 2019-11-23 MED ORDER — PROPOFOL 500 MG/50ML IV EMUL
INTRAVENOUS | Status: DC | PRN
  Administered 2019-11-23: 50 ug/kg/min via INTRAVENOUS

## 2019-11-23 MED ORDER — LOSARTAN POTASSIUM 50 MG PO TABS
100.0000 mg | ORAL_TABLET | Freq: Every day | ORAL | Status: DC
  Administered 2019-11-23 – 2019-11-24 (×2): 100 mg via ORAL
  Filled 2019-11-23 (×2): qty 2

## 2019-11-23 MED ORDER — BUPIVACAINE HCL (PF) 0.25 % IJ SOLN
INTRAMUSCULAR | Status: AC
  Filled 2019-11-23: qty 30

## 2019-11-23 MED ORDER — POLYETHYLENE GLYCOL 3350 17 G PO PACK: 17.0000 g | PACK | Freq: Every day | ORAL | Status: DC | PRN

## 2019-11-23 MED ORDER — KETOROLAC TROMETHAMINE 15 MG/ML IJ SOLN
15.0000 mg | Freq: Four times a day (QID) | INTRAMUSCULAR | Status: AC
  Administered 2019-11-23 – 2019-11-24 (×3): 15 mg via INTRAVENOUS
  Filled 2019-11-23 (×2): qty 1

## 2019-11-23 MED ORDER — DOCUSATE SODIUM 100 MG PO CAPS
100.0000 mg | ORAL_CAPSULE | Freq: Two times a day (BID) | ORAL | Status: DC
  Administered 2019-11-23 – 2019-11-24 (×2): 100 mg via ORAL
  Filled 2019-11-23 (×2): qty 1

## 2019-11-23 MED ORDER — TRANEXAMIC ACID-NACL 1000-0.7 MG/100ML-% IV SOLN
INTRAVENOUS | Status: AC
  Filled 2019-11-23: qty 100

## 2019-11-23 MED ORDER — MIDAZOLAM HCL 2 MG/2ML IJ SOLN
INTRAMUSCULAR | Status: AC
  Filled 2019-11-23: qty 2

## 2019-11-23 MED ORDER — ALUM & MAG HYDROXIDE-SIMETH 200-200-20 MG/5ML PO SUSP: 30.0000 mL | ORAL | Status: DC | PRN

## 2019-11-23 MED ORDER — SODIUM CHLORIDE (PF) 0.9 % IJ SOLN
INTRAMUSCULAR | Status: DC | PRN
  Administered 2019-11-23: 20 mL

## 2019-11-23 MED ORDER — ASPIRIN 81 MG PO CHEW
81.0000 mg | CHEWABLE_TABLET | Freq: Two times a day (BID) | ORAL | Status: DC
  Administered 2019-11-23 – 2019-11-24 (×2): 81 mg via ORAL
  Filled 2019-11-23 (×2): qty 1

## 2019-11-23 MED ORDER — VANCOMYCIN HCL 1000 MG IV SOLR
INTRAVENOUS | Status: AC
  Filled 2019-11-23: qty 1000

## 2019-11-23 MED ORDER — ONDANSETRON HCL 4 MG/2ML IJ SOLN: 4.0000 mg | Freq: Four times a day (QID) | INTRAMUSCULAR | Status: DC | PRN

## 2019-11-23 MED ORDER — LATANOPROST 0.005 % OP SOLN
1.0000 [drp] | Freq: Every day | OPHTHALMIC | Status: DC
  Administered 2019-11-23: 1 [drp] via OPHTHALMIC
  Filled 2019-11-23: qty 2.5

## 2019-11-23 MED ORDER — PHENYLEPHRINE HCL-NACL 10-0.9 MG/250ML-% IV SOLN
INTRAVENOUS | Status: DC | PRN
  Administered 2019-11-23: 35 ug/min via INTRAVENOUS

## 2019-11-23 MED ORDER — METOPROLOL TARTRATE 50 MG PO TABS
50.0000 mg | ORAL_TABLET | Freq: Two times a day (BID) | ORAL | Status: DC
  Administered 2019-11-23 – 2019-11-24 (×2): 50 mg via ORAL
  Filled 2019-11-23 (×2): qty 1

## 2019-11-23 MED ORDER — FENTANYL CITRATE (PF) 100 MCG/2ML IJ SOLN
INTRAMUSCULAR | Status: DC | PRN
  Administered 2019-11-23: 25 ug via INTRAVENOUS

## 2019-11-23 MED ORDER — CHLORHEXIDINE GLUCONATE 0.12 % MT SOLN
15.0000 mL | Freq: Once | OROMUCOSAL | Status: AC
  Administered 2019-11-23: 15 mL via OROMUCOSAL
  Filled 2019-11-23: qty 15

## 2019-11-23 MED ORDER — HYDROMORPHONE HCL 1 MG/ML IJ SOLN: 0.5000 mg | INTRAMUSCULAR | Status: DC | PRN

## 2019-11-23 MED ORDER — FLUTICASONE PROPIONATE 50 MCG/ACT NA SUSP
1.0000 | Freq: Every day | NASAL | Status: DC | PRN
  Filled 2019-11-23: qty 16

## 2019-11-23 MED ORDER — ONDANSETRON HCL 4 MG/2ML IJ SOLN
INTRAMUSCULAR | Status: DC | PRN
  Administered 2019-11-23: 4 mg via INTRAVENOUS

## 2019-11-23 MED ORDER — VANCOMYCIN HCL 1 G IV SOLR
INTRAVENOUS | Status: DC | PRN
  Administered 2019-11-23: 1000 mg via TOPICAL

## 2019-11-23 MED ORDER — PHENOL 1.4 % MT LIQD: 1.0000 | OROMUCOSAL | Status: DC | PRN

## 2019-11-23 MED ORDER — MAGNESIUM CITRATE PO SOLN: 1.0000 | Freq: Once | ORAL | Status: DC | PRN

## 2019-11-23 MED ORDER — HYDROCHLOROTHIAZIDE 25 MG PO TABS
25.0000 mg | ORAL_TABLET | Freq: Every day | ORAL | Status: DC
  Administered 2019-11-23 – 2019-11-24 (×2): 25 mg via ORAL
  Filled 2019-11-23 (×2): qty 1

## 2019-11-23 MED ORDER — TRANEXAMIC ACID-NACL 1000-0.7 MG/100ML-% IV SOLN
1000.0000 mg | Freq: Once | INTRAVENOUS | Status: AC
  Administered 2019-11-23: 1000 mg via INTRAVENOUS
  Filled 2019-11-23: qty 100

## 2019-11-23 MED ORDER — MIDAZOLAM HCL 5 MG/5ML IJ SOLN
INTRAMUSCULAR | Status: DC | PRN
  Administered 2019-11-23: 2 mg via INTRAVENOUS

## 2019-11-23 MED ORDER — PROPOFOL 10 MG/ML IV BOLUS
INTRAVENOUS | Status: DC | PRN
  Administered 2019-11-23: 10 mg via INTRAVENOUS

## 2019-11-23 MED ORDER — METHOCARBAMOL 500 MG PO TABS: 750.0000 mg | ORAL_TABLET | Freq: Four times a day (QID) | ORAL | Status: DC | PRN

## 2019-11-23 MED ORDER — IRRISEPT - 450ML BOTTLE WITH 0.05% CHG IN STERILE WATER, USP 99.95% OPTIME
TOPICAL | Status: DC | PRN
  Administered 2019-11-23: 450 mL

## 2019-11-23 MED ORDER — BUPIVACAINE IN DEXTROSE 0.75-8.25 % IT SOLN
INTRATHECAL | Status: DC | PRN
  Administered 2019-11-23: 2 mL via INTRATHECAL

## 2019-11-23 SURGICAL SUPPLY — 69 items
ACETAB CUP W GRIPTION 54MM (Plate) ×1 IMPLANT
ACETAB CUP W/GRIPTION 54 (Plate) ×2 IMPLANT
BAG DECANTER FOR FLEXI CONT (MISCELLANEOUS) ×3 IMPLANT
CELLS DAT CNTRL 66122 CELL SVR (MISCELLANEOUS) IMPLANT
COVER PERINEAL POST (MISCELLANEOUS) ×3 IMPLANT
COVER SURGICAL LIGHT HANDLE (MISCELLANEOUS) ×3 IMPLANT
COVER WAND RF STERILE (DRAPES) ×3 IMPLANT
CUP ACETAB W/GRIPTION 54 (Plate) IMPLANT
DERMABOND ADHESIVE PROPEN (GAUZE/BANDAGES/DRESSINGS) ×2
DERMABOND ADVANCED .7 DNX6 (GAUZE/BANDAGES/DRESSINGS) IMPLANT
DRAPE C-ARM 42X72 X-RAY (DRAPES) ×3 IMPLANT
DRAPE POUCH INSTRU U-SHP 10X18 (DRAPES) ×3 IMPLANT
DRAPE STERI IOBAN 125X83 (DRAPES) ×3 IMPLANT
DRAPE U-SHAPE 47X51 STRL (DRAPES) ×6 IMPLANT
DRSG AQUACEL AG ADV 3.5X10 (GAUZE/BANDAGES/DRESSINGS) ×3 IMPLANT
DURAPREP 26ML APPLICATOR (WOUND CARE) ×6 IMPLANT
ELECT BLADE 4.0 EZ CLEAN MEGAD (MISCELLANEOUS) ×3
ELECT REM PT RETURN 9FT ADLT (ELECTROSURGICAL) ×3
ELECTRODE BLDE 4.0 EZ CLN MEGD (MISCELLANEOUS) ×1 IMPLANT
ELECTRODE REM PT RTRN 9FT ADLT (ELECTROSURGICAL) ×1 IMPLANT
GLOVE BIO SURGEON STRL SZ7.5 (GLOVE) ×2 IMPLANT
GLOVE BIOGEL PI IND STRL 7.0 (GLOVE) ×1 IMPLANT
GLOVE BIOGEL PI INDICATOR 7.0 (GLOVE) ×2
GLOVE ECLIPSE 7.0 STRL STRAW (GLOVE) ×6 IMPLANT
GLOVE SKINSENSE NS SZ7.5 (GLOVE) ×2
GLOVE SKINSENSE STRL SZ7.5 (GLOVE) ×1 IMPLANT
GLOVE SURG SS PI 6.5 STRL IVOR (GLOVE) ×4 IMPLANT
GLOVE SURG SYN 7.5  E (GLOVE) ×12
GLOVE SURG SYN 7.5 E (GLOVE) ×4 IMPLANT
GLOVE SURG SYN 7.5 PF PI (GLOVE) ×4 IMPLANT
GOWN STRL REIN XL XLG (GOWN DISPOSABLE) ×3 IMPLANT
GOWN STRL REUS W/ TWL LRG LVL3 (GOWN DISPOSABLE) IMPLANT
GOWN STRL REUS W/ TWL XL LVL3 (GOWN DISPOSABLE) ×1 IMPLANT
GOWN STRL REUS W/TWL LRG LVL3 (GOWN DISPOSABLE)
GOWN STRL REUS W/TWL XL LVL3 (GOWN DISPOSABLE) ×3
HANDPIECE INTERPULSE COAX TIP (DISPOSABLE) ×3
HEAD CERAMIC DELTA 36 PLUS 1.5 (Hips) ×2 IMPLANT
HOOD PEEL AWAY FLYTE STAYCOOL (MISCELLANEOUS) ×6 IMPLANT
IV NS IRRIG 3000ML ARTHROMATIC (IV SOLUTION) ×3 IMPLANT
JET LAVAGE IRRISEPT WOUND (IRRIGATION / IRRIGATOR) ×3
KIT BASIN OR (CUSTOM PROCEDURE TRAY) ×3 IMPLANT
LAVAGE JET IRRISEPT WOUND (IRRIGATION / IRRIGATOR) IMPLANT
LINER NEUTRAL 54X36MM PLUS 4 (Hips) ×2 IMPLANT
MARKER SKIN DUAL TIP RULER LAB (MISCELLANEOUS) ×3 IMPLANT
NDL SPNL 18GX3.5 QUINCKE PK (NEEDLE) ×1 IMPLANT
NEEDLE SPNL 18GX3.5 QUINCKE PK (NEEDLE) ×3 IMPLANT
PACK TOTAL JOINT (CUSTOM PROCEDURE TRAY) ×3 IMPLANT
PACK UNIVERSAL I (CUSTOM PROCEDURE TRAY) ×3 IMPLANT
RETRACTOR WND ALEXIS 18 MED (MISCELLANEOUS) IMPLANT
RTRCTR WOUND ALEXIS 18CM MED (MISCELLANEOUS)
SAW OSC TIP CART 19.5X105X1.3 (SAW) ×3 IMPLANT
SCREW 6.5MMX25MM (Screw) ×2 IMPLANT
SET HNDPC FAN SPRY TIP SCT (DISPOSABLE) ×1 IMPLANT
STAPLER VISISTAT 35W (STAPLE) IMPLANT
STEM FEMORAL SZ6 HIGH ACTIS (Stem) ×2 IMPLANT
SUT ETHIBOND 2 V 37 (SUTURE) ×3 IMPLANT
SUT ETHILON 2 0 PSLX (SUTURE) ×2 IMPLANT
SUT VIC AB 0 CT1 27 (SUTURE) ×3
SUT VIC AB 0 CT1 27XBRD ANBCTR (SUTURE) ×1 IMPLANT
SUT VIC AB 1 CTX 36 (SUTURE) ×3
SUT VIC AB 1 CTX36XBRD ANBCTR (SUTURE) ×1 IMPLANT
SUT VIC AB 2-0 CT1 27 (SUTURE) ×6
SUT VIC AB 2-0 CT1 TAPERPNT 27 (SUTURE) ×2 IMPLANT
SYR 50ML LL SCALE MARK (SYRINGE) ×3 IMPLANT
TOWEL GREEN STERILE (TOWEL DISPOSABLE) ×3 IMPLANT
TRAY CATH 16FR W/PLASTIC CATH (SET/KITS/TRAYS/PACK) IMPLANT
TRAY FOLEY W/BAG SLVR 16FR (SET/KITS/TRAYS/PACK) ×3
TRAY FOLEY W/BAG SLVR 16FR ST (SET/KITS/TRAYS/PACK) ×1 IMPLANT
YANKAUER SUCT BULB TIP NO VENT (SUCTIONS) ×3 IMPLANT

## 2019-11-23 NOTE — Evaluation (Signed)
Physical Therapy Evaluation Patient Details Name: Erik Black MRN: [id number] DOB: June 16, 1951 Today's Date: 11/23/2019   History of Present Illness  Pt is a 69 y/o male s/p L THA, direct anterior. PMH includes R THA, DM, glaucoma, sleep apnea on CPAP, R THA, and B TKA.   Clinical Impression  Pt is s/p surgery above with deficits below. Required min to min guard A for mobility tasks within the room using RW. Limited secondary to nausea. Will continue to follow acutely to maximize functional mobility independence and safety.     Follow Up Recommendations Follow surgeon's recommendation for DC plan and follow-up therapies;Supervision for mobility/OOB    Equipment Recommendations  None recommended by PT    Recommendations for Other Services       Precautions / Restrictions Precautions Precautions: Fall Restrictions Weight Bearing Restrictions: Yes LLE Weight Bearing: Weight bearing as tolerated      Mobility  Bed Mobility Overal bed mobility: Needs Assistance Bed Mobility: Supine to Sit     Supine to sit: Min assist     General bed mobility comments: Min A for LLE assist. Increased time required.   Transfers Overall transfer level: Needs assistance Equipment used: Rolling walker (2 wheeled) Transfers: Sit to/from Stand Sit to Stand: Min assist         General transfer comment: Min A for lift assist and steadying to stand.   Ambulation/Gait Ambulation/Gait assistance: Min guard Gait Distance (Feet): 15 Feet Assistive device: Rolling walker (2 wheeled) Gait Pattern/deviations: Step-to pattern;Decreased step length - right;Decreased step length - left;Decreased weight shift to left;Antalgic Gait velocity: Decreased   General Gait Details: Very slow, antalgic gait. Pt able to tolerate gait within the room. Reports some nausea after completion, so further mobiltiy deferred. Cues for sequencing.   Stairs            Wheelchair Mobility    Modified  Rankin (Stroke Patients Only)       Balance Overall balance assessment: Needs assistance Sitting-balance support: No upper extremity supported;Feet supported Sitting balance-Leahy Scale: Good     Standing balance support: Bilateral upper extremity supported;During functional activity Standing balance-Leahy Scale: Poor Standing balance comment: Reliant on BUE support                              Pertinent Vitals/Pain Pain Assessment: 0-10 Pain Score: 5  Pain Location: L hip  Pain Descriptors / Indicators: Aching;Operative site guarding Pain Intervention(s): Limited activity within patient's tolerance;Monitored during session;Repositioned    Home Living Family/patient expects to be discharged to:: Private residence Living Arrangements: Spouse/significant other Available Help at Discharge: Family;Available 24 hours/day Type of Home: House Home Access: Stairs to enter Entrance Stairs-Rails: Right;Left;Can reach both Entrance Stairs-Number of Steps: 3 Home Layout: One level Home Equipment: Walker - 2 wheels;Cane - single point;Grab bars - toilet;Grab bars - tub/shower;Tub bench;Bedside commode      Prior Function Level of Independence: Independent               Hand Dominance        Extremity/Trunk Assessment   Upper Extremity Assessment Upper Extremity Assessment: Overall WFL for tasks assessed    Lower Extremity Assessment Lower Extremity Assessment: LLE deficits/detail LLE Deficits / Details: Deficits consistent with post op pain and weakness.     Cervical / Trunk Assessment Cervical / Trunk Assessment: Normal  Communication   Communication: No difficulties  Cognition Arousal/Alertness: Awake/alert Behavior During Therapy: WFL for tasks  assessed/performed Overall Cognitive Status: Within Functional Limits for tasks assessed                                        General Comments      Exercises Total Joint  Exercises Ankle Circles/Pumps: AROM;Both;20 reps;Supine   Assessment/Plan    PT Assessment Patient needs continued PT services  PT Problem List Decreased strength;Decreased range of motion;Decreased activity tolerance;Decreased mobility;Decreased balance;Decreased knowledge of use of DME;Pain       PT Treatment Interventions DME instruction;Stair training;Gait training;Functional mobility training;Therapeutic activities;Therapeutic exercise;Balance training;Patient/family education    PT Goals (Current goals can be found in the Care Plan section)  Acute Rehab PT Goals Patient Stated Goal: to go home PT Goal Formulation: With patient Time For Goal Achievement: 12/07/19 Potential to Achieve Goals: Good    Frequency 7X/week   Barriers to discharge        Co-evaluation               AM-PAC PT "6 Clicks" Mobility  Outcome Measure Help needed turning from your back to your side while in a flat bed without using bedrails?: A Little Help needed moving from lying on your back to sitting on the side of a flat bed without using bedrails?: A Little Help needed moving to and from a bed to a chair (including a wheelchair)?: A Little Help needed standing up from a chair using your arms (e.g., wheelchair or bedside chair)?: A Little Help needed to walk in hospital room?: A Little Help needed climbing 3-5 steps with a railing? : A Lot 6 Click Score: 17    End of Session Equipment Utilized During Treatment: Gait belt Activity Tolerance: Treatment limited secondary to medical complications (Comment)(nausea) Patient left: in chair;with call bell/phone within reach;with chair alarm set Nurse Communication: Mobility status PT Visit Diagnosis: Other abnormalities of gait and mobility (R26.89);Difficulty in walking, not elsewhere classified (R26.2);Pain Pain - Right/Left: Left Pain - part of body: Hip    Time: 2426-8341 PT Time Calculation (min) (ACUTE ONLY): 21 min   Charges:   PT  Evaluation $PT Eval Low Complexity: 1 Low          Cindee Salt, DPT  Acute Rehabilitation Services  Pager: [phone] Office: [phone]   Lehman Prom 11/23/2019, 6:11 PM

## 2019-11-23 NOTE — Op Note (Signed)
LEFT TOTAL HIP ARTHROPLASTY ANTERIOR APPROACH  Procedure Note Erik Black   [id number]  Pre-op Diagnosis: left hip degenerative joint disease     Post-op Diagnosis: same   Operative Procedures  1. Total hip replacement; Left hip; uncemented [id number]   Personnel  Surgeon(s): Leandrew Koyanagi, MD  Assist: April Green, RNFA   Anesthesia: spinal, local  Prosthesis: Depuy Acetabulum: Pinnacle 54 mm Femur: Actis 6 HO Head: 36 mm size: +1.5 Liner: +4 neutral Bearing Type: ceramic on poly  Total Hip Arthroplasty (Anterior Approach) Op Note:  After informed consent was obtained and the operative extremity marked in the holding area, the patient was brought back to the operating room and placed supine on the HANA table. Next, the operative extremity was prepped and draped in normal sterile fashion. Surgical timeout occurred verifying patient identification, surgical site, surgical procedure and administration of antibiotics.  A modified anterior Smith-Peterson approach to the hip was performed, using the interval between tensor fascia lata and sartorius.  Dissection was carried bluntly down onto the anterior hip capsule. The lateral femoral circumflex vessels were identified and coagulated. A capsulotomy was performed and the capsular flaps tagged for later repair.  The neck osteotomy was performed. The femoral head was removed, the acetabular rim was cleared of soft tissue and attention was turned to reaming the acetabulum.  Sequential reaming was performed under fluoroscopic guidance. We reamed to a size 53 mm, and then impacted the acetabular shell and then a 25 mm cancellous screw was placed through the shell with excellent fixation. The liner was then placed after irrigation and attention turned to the femur.  After placing the femoral hook, the leg was taken to externally rotated, extended and adducted position taking care to perform soft tissue releases to allow for adequate  mobilization of the femur. Soft tissue was cleared from the shoulder of the greater trochanter and the hook elevator used to improve exposure of the proximal femur. Sequential broaching performed up to a size 6. Trial neck and head were placed. The leg was brought back up to neutral and the construct reduced. The position and sizing of components, offset and leg lengths were checked using fluoroscopy. Stability of the construct was checked in extension and external rotation without any subluxation or impingement of prosthesis. We dislocated the prosthesis, dropped the leg back into position, removed trial components, and irrigated copiously. The final stem and head was then placed, the leg brought back up, the system reduced and fluoroscopy used to verify positioning.  We irrigated, obtained hemostasis and closed the capsule using #2 ethibond suture.  One gram of vancomycin powder was placed in the surgical bed. The fascia was closed with #1 vicryl plus, the deep fat layer was closed with 0 vicryl, the subcutaneous layers closed with 2.0 Vicryl Plus and the skin closed with 2.0 nylon and dermabond. A sterile dressing was applied. The patient was awakened in the operating room and taken to recovery in stable condition.  All sponge, needle, and instrument counts were correct at the end of the case.   Position: supine  Complications: see description of procedure.  Time Out: performed   Drains/Packing: none  Estimated blood loss: see anesthesia record  Returned to Recovery Room: in good condition.   Antibiotics: yes   Mechanical VTE (DVT) Prophylaxis: sequential compression devices, TED thigh-high  Chemical VTE (DVT) Prophylaxis: aspirin   Fluid Replacement: see anesthesia record  Specimens Removed: 1 to pathology   Sponge and Instrument Count Correct? yes  PACU: portable radiograph - low AP   Plan/RTC: Return in 2 weeks for staple removal. Weight Bearing/Load Lower Extremity: full  Hip  precautions: none Suture Removal: 2 weeks   N. Glee Arvin, MD [facility] 9:01 AM   Implant Name Type Inv. Item Serial No. Manufacturer Lot No. LRB No. Used Action  ACETABULAR CUP Cathe Mons - [id number] Plate ACETABULAR CUP W GRIPTION  DEPUY SYNTHES [id number] Left 1 Implanted  SCREW 6.5MMX25MM - [id number] Screw SCREW 6.5MMX25MM  DEPUY SYNTHES [id number] Left 1 Implanted  LINER NEUTRAL 54X36MM PLUS 4 - [id number] Hips LINER NEUTRAL 54X36MM PLUS 4  DEPUY SYNTHES [id number] Left 1 Implanted  HEAD CERAMIC DELTA 36 PLUS 1.5 - [id number] Hips HEAD CERAMIC DELTA 36 PLUS 1.5  DEPUY SYNTHES [id number] Left 1 Implanted  STEM FEMORAL SZ6 HIGH ACTIS - [id number] Stem STEM FEMORAL SZ6 HIGH ACTIS  [employer] DEPU[employer] SPINE [id number] Left 1 Implanted

## 2019-11-23 NOTE — Anesthesia Postprocedure Evaluation (Signed)
Anesthesia Post Note  Patient: Erik Black  Procedure(s) Performed: LEFT TOTAL HIP ARTHROPLASTY ANTERIOR APPROACH (Left Hip)     Patient location during evaluation: PACU Anesthesia Type: Spinal Level of consciousness: oriented and awake and alert Pain management: pain level controlled Vital Signs Assessment: post-procedure vital signs reviewed and stable Respiratory status: spontaneous breathing, respiratory function stable and nonlabored ventilation Cardiovascular status: blood pressure returned to baseline and stable Postop Assessment: no headache, no backache, no apparent nausea or vomiting, spinal receding and patient able to bend at knees Anesthetic complications: no    Last Vitals:  Vitals:   11/23/19 1049 11/23/19 1053  BP: 120/79   Pulse:  (!) 58  Resp:  11  Temp:    SpO2:  99%    Last Pain:  Vitals:   11/23/19 1045  TempSrc:   PainSc: Asleep                 Kaley Jutras A.

## 2019-11-23 NOTE — Anesthesia Procedure Notes (Signed)
Procedure Name: MAC Date/Time: 11/23/2019 7:14 AM Performed by: Inda Coke, CRNA Pre-anesthesia Checklist: Patient identified, Emergency Drugs available, Suction available, Timeout performed and Patient being monitored Patient Re-evaluated:Patient Re-evaluated prior to induction Oxygen Delivery Method: Simple face mask Induction Type: IV induction Dental Injury: Teeth and Oropharynx as per pre-operative assessment

## 2019-11-23 NOTE — Anesthesia Procedure Notes (Addendum)
Spinal  Patient location during procedure: OR Start time: 11/23/2019 7:16 AM End time: 11/23/2019 7:23 AM Staffing Performed: anesthesiologist  Anesthesiologist: Mal Amabile, MD Preanesthetic Checklist Completed: patient identified, IV checked, site marked, risks and benefits discussed, surgical consent, monitors and equipment checked, pre-op evaluation and timeout performed Spinal Block Patient position: sitting Prep: DuraPrep and site prepped and draped Patient monitoring: heart rate, cardiac monitor, continuous pulse ox and blood pressure Approach: midline Location: L3-4 Injection technique: single-shot Needle Needle type: Quincke  Needle gauge: 22 G Needle length: 10 cm Needle insertion depth: 11 cm Assessment Sensory level: T4 Additional Notes Patient tolerated procedure well. Adequate sensory level. Difficult due to MO. Attempt x 4.

## 2019-11-23 NOTE — H&P (Signed)
PREOPERATIVE H&P  Chief Complaint: left hip degenerative joint disease  HPI: Erik Black is a 69 y.o. male who presents for surgical treatment of left hip degenerative joint disease.  He denies any changes in medical history.  Past Medical History:  Diagnosis Date  . Anxiety   . Arthritis   . Depression   . Diabetes mellitus without complication (HCC)   . GERD (gastroesophageal reflux disease)   . Glaucoma    both eyes  . Hypertension   . Sleep apnea    uses Cpap nightly   Past Surgical History:  Procedure Laterality Date  . CARPAL TUNNEL RELEASE Bilateral   . COLONOSCOPY    . EYE SURGERY Bilateral    cataract surgery with lens implants  . KNEE ARTHROPLASTY Right   . KNEE ARTHROPLASTY Left   . KNEE ARTHROSCOPY Right   . KNEE ARTHROSCOPY Left   . MULTIPLE TOOTH EXTRACTIONS    . ROTATOR CUFF REPAIR Right   . ROTATOR CUFF REPAIR W/ DISTAL CLAVICLE EXCISION Left   . TOTAL HIP ARTHROPLASTY Right 05/11/2019   Procedure: RIGHT TOTAL HIP ARTHROPLASTY ANTERIOR APPROACH;  Surgeon: Tarry Kos, MD;  Location: [facility] OR;  Service: Orthopedics;  Laterality: Right;  Marland Kitchen VASECTOMY     Social History   Socioeconomic History  . Marital status: Married    Spouse name: Not on file  . Number of children: Not on file  . Years of education: Not on file  . Highest education level: Not on file  Occupational History  . Not on file  Tobacco Use  . Smoking status: Current Some Day Smoker  . Smokeless tobacco: Never Used  . Tobacco comment: smokes a pack a month  Substance and Sexual Activity  . Alcohol use: Not Currently  . Drug use: Not Currently  . Sexual activity: Not on file  Other Topics Concern  . Not on file  Social History Narrative  . Not on file   Social Determinants of Health   Financial Resource Strain:   . Difficulty of Paying Living Expenses:   Food Insecurity:   . Worried About Programme researcher, broadcasting/film/video in the Last Year:   . Barista in the Last Year:    Transportation Needs:   . Freight forwarder (Medical):   Marland Kitchen Lack of Transportation (Non-Medical):   Physical Activity:   . Days of Exercise per Week:   . Minutes of Exercise per Session:   Stress:   . Feeling of Stress :   Social Connections:   . Frequency of Communication with Friends and Family:   . Frequency of Social Gatherings with Friends and Family:   . Attends Religious Services:   . Active Member of Clubs or Organizations:   . Attends Banker Meetings:   Marland Kitchen Marital Status:    History reviewed. No pertinent family history. No Known Allergies Prior to Admission medications   Medication Sig Start Date End Date Taking? Authorizing Provider  amLODipine (NORVASC) 10 MG tablet Take 10 mg by mouth daily. 01/26/19  Yes [provider]  aspirin EC 81 MG tablet Take 1 tablet (81 mg total) by mouth 2 (two) times daily. 05/11/19  Yes Tarry Kos, MD  atorvastatin (LIPITOR) 40 MG tablet Take 20 mg by mouth at bedtime.   Yes [provider]  Cholecalciferol (VITAMIN D) 50 MCG (2000 UT) tablet Take 4,000 Units by mouth daily.  03/03/19  Yes [provider]  diphenhydrAMINE (BENADRYL)  25 MG tablet Take 25 mg by mouth daily as needed for allergies.   Yes [provider]  doxazosin (CARDURA) 8 MG tablet Take 4 mg by mouth 2 (two) times daily.  01/16/19  Yes [provider]  DULoxetine (CYMBALTA) 60 MG capsule Take 60 mg by mouth daily. 01/07/19  Yes [provider]  fluticasone (FLONASE) 50 MCG/ACT nasal spray Place 1 spray into both nostrils as needed for allergies.  04/04/19  Yes [provider]  hydrochlorothiazide (HYDRODIURIL) 25 MG tablet Take 25 mg by mouth daily. 01/16/19  Yes [provider]  latanoprost (XALATAN) 0.005 % ophthalmic solution Place 1 drop into both eyes at bedtime.   Yes [provider]  losartan (COZAAR) 100 MG tablet Take 100 mg by mouth daily.   Yes [provider]   metoprolol tartrate (LOPRESSOR) 50 MG tablet Take 50 mg by mouth 2 (two) times daily.  01/16/19  Yes [provider]  Multiple Vitamins-Minerals (MULTIVITAMIN WITH MINERALS) tablet Take 1 tablet by mouth daily.   Yes [provider]  Omega-3 1000 MG CAPS Take 2,000 mg by mouth 3 (three) times daily after meals.   Yes [provider]  omeprazole (PRILOSEC) 40 MG capsule Take 40 mg by mouth daily.   Yes [provider]  phentermine (ADIPEX-P) 37.5 MG tablet Take 37.5 mg by mouth daily. 04/25/19  Yes [provider]  potassium chloride SA (KLOR-CON) 20 MEQ tablet Take 40-60 mEq by mouth See admin instructions. 60 meq in the morning and 40 meq in the evening 04/07/19  Yes [provider]  Semaglutide,0.25 or 0.5MG /DOS, (OZEMPIC, 0.25 OR 0.5 MG/DOSE,) 2 MG/1.5ML SOPN Inject 0.5 mg into the skin every Tuesday.   Yes [provider]  SIMBRINZA 1-0.2 % SUSP Place 1 drop into both eyes 3 (three) times daily. 04/04/19  Yes [provider]  timolol (TIMOPTIC-XR) 0.5 % ophthalmic gel-forming Place 1 drop into both eyes daily.   Yes [provider]  topiramate (TOPAMAX) 50 MG tablet Take 50 mg by mouth 2 (two) times daily. 11/04/19  Yes [provider]  aspirin EC 81 MG tablet Take 1 tablet (81 mg total) by mouth 2 (two) times daily. 11/22/19   Tarry Kos, MD  methocarbamol (ROBAXIN) 750 MG tablet Take 1 tablet (750 mg total) by mouth 2 (two) times daily as needed for muscle spasms. 11/22/19   Tarry Kos, MD  ondansetron (ZOFRAN) 4 MG tablet Take 1-2 tablets (4-8 mg total) by mouth every 8 (eight) hours as needed for nausea or vomiting. 11/22/19   Tarry Kos, MD  oxyCODONE-acetaminophen (PERCOCET) 5-325 MG tablet Take 1-2 tablets by mouth every 8 (eight) hours as needed for severe pain. 11/22/19   Tarry Kos, MD  sulfamethoxazole-trimethoprim (BACTRIM DS) 800-160 MG tablet Take 1 tablet by mouth 2 (two) times daily.  11/22/19   Tarry Kos, MD     Positive ROS: All other systems have been reviewed and were otherwise negative with the exception of those mentioned in the HPI and as above.  Physical Exam: General: Alert, no acute distress Cardiovascular: No pedal edema Respiratory: No cyanosis, no use of accessory musculature GI: abdomen soft Skin: No lesions in the area of chief complaint Neurologic: Sensation intact distally Psychiatric: Patient is competent for consent with normal mood and affect Lymphatic: no lymphedema  MUSCULOSKELETAL: exam stable  Assessment: left hip degenerative joint disease  Plan: Plan for Procedure(s): LEFT TOTAL HIP ARTHROPLASTY ANTERIOR APPROACH  The risks benefits and alternatives were discussed with the patient including but not limited to the risks of nonoperative treatment, versus surgical intervention including infection, bleeding, nerve injury,  blood clots, cardiopulmonary complications, morbidity, mortality, among others, and they were willing to proceed.   Preoperative templating of the joint replacement has been completed, documented, and submitted to the Operating Room personnel in order to optimize intra-operative equipment management.   Eduard Roux, MD 11/23/2019 6:58 AM

## 2019-11-23 NOTE — Progress Notes (Signed)
Received pt from PACU, accompanied by staff, Pt alert/oriented in no apparent distress. Situated/orientated to room/equipments, welcome pack/guide/menu provided to pt/spouse. Made aware that facility is not responsible for any losses/damages to any personal belongings/valuables, or has the options to hand it to security for safe keeping. 3 side rails up,call bell/room phone within reach. All wheels locked and bed in lowest position. No complaints voiced.

## 2019-11-23 NOTE — Transfer of Care (Signed)
Immediate Anesthesia Transfer of Care Note  Patient: Erik Black  Procedure(s) Performed: LEFT TOTAL HIP ARTHROPLASTY ANTERIOR APPROACH (Left Hip)  Patient Location: PACU  Anesthesia Type:MAC and Spinal  Level of Consciousness: awake, alert  and oriented  Airway & Oxygen Therapy: Patient Spontanous Breathing  Post-op Assessment: Report given to RN and Post -op Vital signs reviewed and stable  Post vital signs: Reviewed and stable  Last Vitals:  Vitals Value Taken Time  BP 99/64 11/23/19 0934  Temp    Pulse 65 11/23/19 0936  Resp 12 11/23/19 0936  SpO2 96 % 11/23/19 0936  Vitals shown include unvalidated device data.  Last Pain:  Vitals:   11/23/19 0620  TempSrc:   PainSc: 7       Patients Stated Pain Goal: 3 (33/29/51 8841)  Complications: No apparent anesthesia complications

## 2019-11-24 ENCOUNTER — Encounter: Payer: Self-pay | Admitting: *Deleted

## 2019-11-24 LAB — CBC
HCT: 37.2 % — ABNORMAL LOW (ref 39.0–52.0)
Hemoglobin: 12 g/dL — ABNORMAL LOW (ref 13.0–17.0)
MCH: 29.8 pg (ref 26.0–34.0)
MCHC: 32.3 g/dL (ref 30.0–36.0)
MCV: 92.3 fL (ref 80.0–100.0)
Platelets: 126 10*3/uL — ABNORMAL LOW (ref 150–400)
RBC: 4.03 MIL/uL — ABNORMAL LOW (ref 4.22–5.81)
RDW: 13.9 % (ref 11.5–15.5)
WBC: 12.4 10*3/uL — ABNORMAL HIGH (ref 4.0–10.5)
nRBC: 0 % (ref 0.0–0.2)

## 2019-11-24 LAB — BASIC METABOLIC PANEL
Anion gap: 6 (ref 5–15)
BUN: 19 mg/dL (ref 8–23)
CO2: 27 mmol/L (ref 22–32)
Calcium: 9 mg/dL (ref 8.9–10.3)
Chloride: 108 mmol/L (ref 98–111)
Creatinine, Ser: 1.42 mg/dL — ABNORMAL HIGH (ref 0.61–1.24)
GFR calc Af Amer: 59 mL/min — ABNORMAL LOW (ref >60)
GFR calc non Af Amer: 51 mL/min — ABNORMAL LOW (ref >60)
Glucose, Bld: 164 mg/dL — ABNORMAL HIGH (ref 70–99)
Potassium: 4.3 mmol/L (ref 3.5–5.1)
Sodium: 141 mmol/L (ref 135–145)

## 2019-11-24 LAB — GLUCOSE, CAPILLARY
Glucose-Capillary: 161 mg/dL — ABNORMAL HIGH (ref 70–99)
Glucose-Capillary: 151 mg/dL — ABNORMAL HIGH (ref 70–99)

## 2019-11-24 MED ORDER — SEMAGLUTIDE(0.25 OR 0.5MG/DOS) 2 MG/1.5ML ~~LOC~~ SOPN
0.3500 mg | PEN_INJECTOR | SUBCUTANEOUS | Status: DC
  Administered 2019-11-24: 0.35 mg via SUBCUTANEOUS

## 2019-11-24 NOTE — Plan of Care (Signed)

## 2019-11-24 NOTE — Progress Notes (Signed)
Patient discharged home by private care.  All personal belongings gathered by spouse and taken home at time of patient's discharge.  Instructions gone over with both spouse and patient, both acknowledged understanding.  No further questions.

## 2019-11-24 NOTE — Progress Notes (Signed)
Physical Therapy Treatment Patient Details Name: Keevin Panebianco MRN: [id number] DOB: 1950/07/04 Today's Date: 11/24/2019    History of Present Illness Pt is a 69 y/o male s/p L THA, direct anterior. PMH includes R THA, DM, glaucoma, sleep apnea on CPAP, R THA, and B TKA.     PT Comments    Pt is progressing well this am with good motivation.  Progressed gt to hallway distances and performed LE strengthening and ROM.  Plan for follow up this pm for stair training. Will review and issued HEP before d/c  Pt is progressing well enough from a mobility standpoint to d/c home following second PT session will inform nursing and MD.   Follow Up Recommendations  Follow surgeon's recommendation for DC plan and follow-up therapies;Supervision for mobility/OOB     Equipment Recommendations  None recommended by PT    Recommendations for Other Services       Precautions / Restrictions Precautions Precautions: Fall Restrictions Weight Bearing Restrictions: Yes LLE Weight Bearing: Weight bearing as tolerated    Mobility  Bed Mobility Overal bed mobility: Needs Assistance Bed Mobility: Supine to Sit     Supine to sit: Supervision     General bed mobility comments: Increased time and effort but able to come to sitting with use of bed rail.  Transfers Overall transfer level: Needs assistance Equipment used: Rolling walker (2 wheeled) Transfers: Sit to/from Stand Sit to Stand: Supervision         General transfer comment: Cues for sequencing and hand placement to push from seated surface.  Ambulation/Gait Ambulation/Gait assistance: Min guard Gait Distance (Feet): 120 Feet Assistive device: Rolling walker (2 wheeled) Gait Pattern/deviations: Step-to pattern;Decreased step length - right;Decreased step length - left;Decreased weight shift to left;Antalgic Gait velocity: Decreased   General Gait Details: Very slow, antalgic gait. Pt able to tolerate gait in the hall today.   Cues for sequencing and gt symmetry.   Stairs             Wheelchair Mobility    Modified Rankin (Stroke Patients Only)       Balance Overall balance assessment: Needs assistance Sitting-balance support: No upper extremity supported;Feet supported Sitting balance-Leahy Scale: Good       Standing balance-Leahy Scale: Poor                              Cognition Arousal/Alertness: Awake/alert Behavior During Therapy: WFL for tasks assessed/performed Overall Cognitive Status: Within Functional Limits for tasks assessed                                        Exercises Total Joint Exercises Ankle Circles/Pumps: AROM;Both;20 reps;Supine Quad Sets: AROM;Left;10 reps;Supine Short Arc Quad: AROM;Left;10 reps;Supine Heel Slides: AROM;Left;10 reps;Supine Hip ABduction/ADduction: AROM;Left;10 reps;Supine    General Comments        Pertinent Vitals/Pain Pain Assessment: 0-10 Pain Score: 3  Pain Location: L hip  Pain Descriptors / Indicators: Aching;Operative site guarding Pain Intervention(s): Monitored during session;Repositioned;Ice applied    Home Living                      Prior Function            PT Goals (current goals can now be found in the care plan section) Acute Rehab PT Goals Patient Stated Goal: to go home  Potential to Achieve Goals: Good Progress towards PT goals: Progressing toward goals    Frequency    7X/week      PT Plan Current plan remains appropriate    Co-evaluation              AM-PAC PT "6 Clicks" Mobility   Outcome Measure  Help needed turning from your back to your side while in a flat bed without using bedrails?: A Little Help needed moving from lying on your back to sitting on the side of a flat bed without using bedrails?: A Little Help needed moving to and from a bed to a chair (including a wheelchair)?: A Little Help needed standing up from a chair using your arms (e.g.,  wheelchair or bedside chair)?: A Little Help needed to walk in hospital room?: A Little Help needed climbing 3-5 steps with a railing? : A Lot 6 Click Score: 17    End of Session Equipment Utilized During Treatment: Gait belt Activity Tolerance: Treatment limited secondary to medical complications (Comment) Patient left: in chair;with call bell/phone within reach;with chair alarm set Nurse Communication: Mobility status PT Visit Diagnosis: Other abnormalities of gait and mobility (R26.89);Difficulty in walking, not elsewhere classified (R26.2);Pain Pain - Right/Left: Left Pain - part of body: Hip     Time: 7867-6720 PT Time Calculation (min) (ACUTE ONLY): 21 min  Charges:  $Gait Training: 8-22 mins                     Bonney Leitz , PTA Acute Rehabilitation Services Pager [phone] Office [phone]     Jelissa Espiritu Artis Delay 11/24/2019, 11:06 AM

## 2019-11-24 NOTE — Progress Notes (Signed)
Physical Therapy Treatment Patient Details Name: Erik Black MRN: [id number] DOB: 08/21/1950 Today's Date: 11/24/2019    History of Present Illness Pt is a 69 y/o male s/p L THA, direct anterior. PMH includes R THA, DM, glaucoma, sleep apnea on CPAP, R THA, and B TKA.     PT Comments    PTA returned for PM session.  HEP issued and PTA reviewed standing exercises for home use.  Pt educated to perform HEP 3x daily and ambulate with spouse close by.  Informed nursing he was ready for d/c home.    Follow Up Recommendations  Follow surgeon's recommendation for DC plan and follow-up therapies;Supervision for mobility/OOB     Equipment Recommendations  None recommended by PT    Recommendations for Other Services       Precautions / Restrictions Precautions Precautions: Fall Restrictions Weight Bearing Restrictions: Yes LLE Weight Bearing: Weight bearing as tolerated    Mobility  Bed Mobility               General bed mobility comments: Pt seated in recliner on arrival.  Transfers Overall transfer level: Needs assistance Equipment used: Rolling walker (2 wheeled) Transfers: Sit to/from Stand Sit to Stand: Supervision         General transfer comment: Cues for sequencing and hand placement to push from seated surface.  Cues for forward weight shifting.  Ambulation/Gait Ambulation/Gait assistance: Min guard Gait Distance (Feet): 140 Feet Assistive device: Rolling walker (2 wheeled) Gait Pattern/deviations: Decreased step length - right;Decreased step length - left;Decreased weight shift to left;Antalgic;Step-through pattern Gait velocity: Decreased   General Gait Details: Progression to step through with cues for gt symmetry and safe use of RW.   Stairs Stairs: Yes Stairs assistance: Min assist Stair Management: Two rails Number of Stairs: 3 General stair comments: Cues for sequencing.   Wheelchair Mobility    Modified Rankin (Stroke Patients  Only)       Balance Overall balance assessment: Needs assistance Sitting-balance support: No upper extremity supported;Feet supported Sitting balance-Leahy Scale: Good     Standing balance support: Bilateral upper extremity supported;During functional activity Standing balance-Leahy Scale: Poor Standing balance comment: Reliant on BUE support                             Cognition Arousal/Alertness: Awake/alert Behavior During Therapy: WFL for tasks assessed/performed Overall Cognitive Status: Within Functional Limits for tasks assessed                                        Exercises Total Joint Exercises Hip ABduction/ADduction: AROM;Left;10 reps;Standing Knee Flexion: AROM;Left;10 reps;Standing Marching in Standing: AROM;Left;10 reps;Standing Standing Hip Extension: AROM;Left;10 reps;Standing    General Comments        Pertinent Vitals/Pain Pain Assessment: 0-10 Pain Score: 6  Pain Location: L hip  Pain Descriptors / Indicators: Aching;Operative site guarding Pain Intervention(s): Monitored during session;Repositioned    Home Living                      Prior Function            PT Goals (current goals can now be found in the care plan section) Acute Rehab PT Goals Patient Stated Goal: to go home Potential to Achieve Goals: Good Progress towards PT goals: Progressing toward goals    Frequency  7X/week      PT Plan Current plan remains appropriate    Co-evaluation              AM-PAC PT "6 Clicks" Mobility   Outcome Measure  Help needed turning from your back to your side while in a flat bed without using bedrails?: A Little Help needed moving from lying on your back to sitting on the side of a flat bed without using bedrails?: A Little Help needed moving to and from a bed to a chair (including a wheelchair)?: A Little Help needed standing up from a chair using your arms (e.g., wheelchair or bedside  chair)?: A Little Help needed to walk in hospital room?: A Little Help needed climbing 3-5 steps with a railing? : A Little 6 Click Score: 18    End of Session Equipment Utilized During Treatment: Gait belt Activity Tolerance: Patient tolerated treatment well Patient left: in bed;with family/visitor present;with call bell/phone within reach Nurse Communication: Mobility status(ready for d/c) PT Visit Diagnosis: Other abnormalities of gait and mobility (R26.89);Difficulty in walking, not elsewhere classified (R26.2);Pain Pain - Right/Left: Left Pain - part of body: Hip     Time: 8588-5027 PT Time Calculation (min) (ACUTE ONLY): 28 min  Charges:  $Gait Training: 8-22 mins $Therapeutic Exercise: 8-22 mins                     Bonney Leitz , PTA Acute Rehabilitation Services Pager [phone] Office [phone]     Aimee Artis Delay 11/24/2019, 6:45 PM

## 2019-11-24 NOTE — Plan of Care (Signed)
  Problem: Education: Goal: Knowledge of General Education information will improve Description: Including pain rating scale, medication(s)/side effects and non-pharmacologic comfort measures 11/24/2019 1612 by Lenise Arena, RN Outcome: Adequate for Discharge 11/24/2019 0755 by Lenise Arena, RN Outcome: Progressing   Problem: Health Behavior/Discharge Planning: Goal: Ability to manage health-related needs will improve 11/24/2019 1612 by Lenise Arena, RN Outcome: Adequate for Discharge 11/24/2019 0755 by Lenise Arena, RN Outcome: Progressing   Problem: Clinical Measurements: Goal: Ability to maintain clinical measurements within normal limits will improve 11/24/2019 1612 by Lenise Arena, RN Outcome: Adequate for Discharge 11/24/2019 0755 by Lenise Arena, RN Outcome: Progressing Goal: Will remain free from infection 11/24/2019 1612 by Lenise Arena, RN Outcome: Adequate for Discharge 11/24/2019 0755 by Lenise Arena, RN Outcome: Progressing Goal: Diagnostic test results will improve 11/24/2019 1612 by Lenise Arena, RN Outcome: Adequate for Discharge 11/24/2019 0755 by Lenise Arena, RN Outcome: Progressing Goal: Respiratory complications will improve 11/24/2019 1612 by Lenise Arena, RN Outcome: Adequate for Discharge 11/24/2019 0755 by Lenise Arena, RN Outcome: Progressing Goal: Cardiovascular complication will be avoided 11/24/2019 1612 by Lenise Arena, RN Outcome: Adequate for Discharge 11/24/2019 0755 by Lenise Arena, RN Outcome: Progressing   Problem: Activity: Goal: Risk for activity intolerance will decrease 11/24/2019 1612 by Lenise Arena, RN Outcome: Adequate for Discharge 11/24/2019 0755 by Lenise Arena, RN Outcome: Progressing   Problem: Nutrition: Goal: Adequate nutrition will be maintained 11/24/2019 1612 by Lenise Arena, RN Outcome: Adequate for Discharge 11/24/2019 0755 by Lenise Arena, RN Outcome: Progressing   Problem: Coping: Goal: Level of anxiety  will decrease 11/24/2019 1612 by Lenise Arena, RN Outcome: Adequate for Discharge 11/24/2019 0755 by Lenise Arena, RN Outcome: Progressing   Problem: Elimination: Goal: Will not experience complications related to bowel motility 11/24/2019 1612 by Lenise Arena, RN Outcome: Adequate for Discharge 11/24/2019 0755 by Lenise Arena, RN Outcome: Progressing Goal: Will not experience complications related to urinary retention 11/24/2019 1612 by Lenise Arena, RN Outcome: Adequate for Discharge 11/24/2019 0755 by Lenise Arena, RN Outcome: Progressing   Problem: Pain Managment: Goal: General experience of comfort will improve 11/24/2019 1612 by Lenise Arena, RN Outcome: Adequate for Discharge 11/24/2019 0755 by Lenise Arena, RN Outcome: Progressing   Problem: Safety: Goal: Ability to remain free from injury will improve 11/24/2019 1612 by Lenise Arena, RN Outcome: Adequate for Discharge 11/24/2019 0755 by Lenise Arena, RN Outcome: Progressing   Problem: Skin Integrity: Goal: Risk for impaired skin integrity will decrease 11/24/2019 1612 by Lenise Arena, RN Outcome: Adequate for Discharge 11/24/2019 0755 by Lenise Arena, RN Outcome: Progressing

## 2019-11-24 NOTE — Progress Notes (Signed)
   Subjective:  Patient reports pain as mild.  Did well with PT yesterday.  Objective:   VITALS:   Vitals:   11/23/19 1844 11/23/19 2006 11/24/19 0025 11/24/19 0411  BP: 120/68 135/74 118/73 120/68  Pulse: 76 80 76 76  Resp: 14 15 16 15   Temp: 98.4 F (36.9 C) 98.4 F (36.9 C) (!) 97.4 F (36.3 C) (!) 97.4 F (36.3 C)  TempSrc: Oral Oral Oral Oral  SpO2: 99% 99% 97% 100%  Weight:      Height:        Neurovascular intact Sensation intact distally Intact pulses distally Dorsiflexion/Plantar flexion intact Incision: dressing C/D/I and no drainage No cellulitis present Compartment soft   Lab Results  Component Value Date   WBC 12.4 (H) 11/24/2019   HGB 12.0 (L) 11/24/2019   HCT 37.2 (L) 11/24/2019   MCV 92.3 11/24/2019   PLT 126 (L) 11/24/2019     Assessment/Plan:  1 Day Post-Op   - Expected postop acute blood loss anemia - will monitor for symptoms - Up with PT/OT - plan is to go home pending PT - DVT ppx - SCDs, ambulation, aspirin - WBAT operative extremity - Pain controlled with po meds - Discharge planning - anticipate home after PT  11/26/2019 11/24/2019, 7:31 AM [phone]

## 2019-11-26 NOTE — Discharge Summary (Signed)
Patient ID: Erik Black MRN: [id number] DOB/AGE: 01-13-1951 69 y.o.  Admit date: 11/23/2019 Discharge date: 11/26/2019  Admission Diagnoses:  Primary osteoarthritis of left hip  Discharge Diagnoses:  Principal Problem:   Primary osteoarthritis of left hip Active Problems:   Status post total replacement of left hip   Past Medical History:  Diagnosis Date  . Anxiety   . Arthritis   . Depression   . Diabetes mellitus without complication (HCC)   . GERD (gastroesophageal reflux disease)   . Glaucoma    both eyes  . Hypertension   . Sleep apnea    uses Cpap nightly    Surgeries: Procedure(s): LEFT TOTAL HIP ARTHROPLASTY ANTERIOR APPROACH on 11/23/2019   Consultants (if any):   Discharged Condition: Improved  Hospital Course: Erik Black is an 69 y.o. male who was admitted 11/23/2019 with a diagnosis of Primary osteoarthritis of left hip and went to the operating room on 11/23/2019 and underwent the above named procedures.    He was given perioperative antibiotics:  Anti-infectives (From admission, onward)   Start     Dose/Rate Route Frequency Ordered Stop   11/23/19 1400  ceFAZolin (ANCEF) 3 g in dextrose 5 % 50 mL IVPB     3 g 100 mL/hr over 30 Minutes Intravenous Every 8 hours 11/23/19 1259 11/24/19 1046   11/23/19 0810  vancomycin (VANCOCIN) powder  Status:  Discontinued       As needed 11/23/19 0810 11/23/19 0929   11/23/19 0600  ceFAZolin (ANCEF) 3 g in dextrose 5 % 50 mL IVPB     3 g 100 mL/hr over 30 Minutes Intravenous On call to O.R. 11/20/19 9735 11/23/19 0800    .  He was given sequential compression devices, early ambulation, and appropriate chemoprophylaxis for DVT prophylaxis.  He benefited maximally from the hospital stay and there were no complications.    Recent vital signs:  Vitals:   11/24/19 0758 11/24/19 1455  BP: 125/78 127/79  Pulse: 79 70  Resp: 14 14  Temp: 98 F (36.7 C) 98.2 F (36.8 C)  SpO2: 100% 99%     Recent laboratory studies:  Lab Results  Component Value Date   HGB 12.0 (L) 11/24/2019   HGB 14.7 11/19/2019   HGB 11.7 (L) 05/12/2019   Lab Results  Component Value Date   WBC 12.4 (H) 11/24/2019   PLT 126 (L) 11/24/2019   Lab Results  Component Value Date   INR 1.0 11/19/2019   Lab Results  Component Value Date   NA 141 11/24/2019   K 4.3 11/24/2019   CL 108 11/24/2019   CO2 27 11/24/2019   BUN 19 11/24/2019   CREATININE 1.42 (H) 11/24/2019   GLUCOSE 164 (H) 11/24/2019    Discharge Medications:   Allergies as of 11/24/2019   No Known Allergies     Medication List    TAKE these medications   amLODipine 10 MG tablet Commonly known as: NORVASC Take 10 mg by mouth daily.   aspirin EC 81 MG tablet Take 1 tablet (81 mg total) by mouth 2 (two) times daily.   aspirin EC 81 MG tablet Take 1 tablet (81 mg total) by mouth 2 (two) times daily.   atorvastatin 40 MG tablet Commonly known as: LIPITOR Take 20 mg by mouth at bedtime.   diphenhydrAMINE 25 MG tablet Commonly known as: BENADRYL Take 25 mg by mouth daily as needed for allergies.   doxazosin 8 MG tablet Commonly known as: CARDURA Take  4 mg by mouth 2 (two) times daily.   DULoxetine 60 MG capsule Commonly known as: CYMBALTA Take 60 mg by mouth daily.   fluticasone 50 MCG/ACT nasal spray Commonly known as: FLONASE Place 1 spray into both nostrils as needed for allergies.   hydrochlorothiazide 25 MG tablet Commonly known as: HYDRODIURIL Take 25 mg by mouth daily.   latanoprost 0.005 % ophthalmic solution Commonly known as: XALATAN Place 1 drop into both eyes at bedtime.   losartan 100 MG tablet Commonly known as: COZAAR Take 100 mg by mouth daily.   methocarbamol 750 MG tablet Commonly known as: ROBAXIN Take 1 tablet (750 mg total) by mouth 2 (two) times daily as needed for muscle spasms.   metoprolol tartrate 50 MG tablet Commonly known as: LOPRESSOR Take 50 mg by mouth 2 (two) times  daily.   multivitamin with minerals tablet Take 1 tablet by mouth daily.   Omega-3 1000 MG Caps Take 2,000 mg by mouth 3 (three) times daily after meals.   omeprazole 40 MG capsule Commonly known as: PRILOSEC Take 40 mg by mouth daily.   ondansetron 4 MG tablet Commonly known as: ZOFRAN Take 1-2 tablets (4-8 mg total) by mouth every 8 (eight) hours as needed for nausea or vomiting.   oxyCODONE-acetaminophen 5-325 MG tablet Commonly known as: Percocet Take 1-2 tablets by mouth every 8 (eight) hours as needed for severe pain.   Ozempic (0.25 or 0.5 MG/DOSE) 2 MG/1.5ML Sopn Generic drug: Semaglutide(0.25 or 0.5MG /DOS) Inject 0.35 mg into the skin every Tuesday.   phentermine 37.5 MG tablet Commonly known as: ADIPEX-P Take 37.5 mg by mouth daily.   potassium chloride SA 20 MEQ tablet Commonly known as: KLOR-CON Take 40-60 mEq by mouth See admin instructions. 60 meq in the morning and 40 meq in the evening   Simbrinza 1-0.2 % Susp Generic drug: Brinzolamide-Brimonidine Place 1 drop into both eyes 3 (three) times daily.   sulfamethoxazole-trimethoprim 800-160 MG tablet Commonly known as: BACTRIM DS Take 1 tablet by mouth 2 (two) times daily.   timolol 0.5 % ophthalmic gel-forming Commonly known as: TIMOPTIC-XR Place 1 drop into both eyes daily.   topiramate 50 MG tablet Commonly known as: TOPAMAX Take 50 mg by mouth 2 (two) times daily.   Vitamin D 50 MCG (2000 UT) tablet Take 4,000 Units by mouth daily.       Diagnostic Studies: DG Pelvis Portable  Result Date: 11/23/2019 CLINICAL DATA:  Recent left hip replacement EXAM: PORTABLE PELVIS 1-2 VIEWS COMPARISON:  Intraoperative films from earlier in the same day. FINDINGS: Pelvic ring is intact. Bilateral hip replacements are seen. No acute fracture or dislocation is noted. No soft tissue changes are noted. IMPRESSION: Status post left hip replacement. Electronically Signed   By: Inez Catalina M.D.   On: 11/23/2019 10:24    DG C-Arm 1-60 Min  Result Date: 11/23/2019 CLINICAL DATA:  Anterior left total hip arthroplasty EXAM: OPERATIVE LEFT HIP (WITH PELVIS IF PERFORMED) 2 VIEWS TECHNIQUE: Fluoroscopic spot image(s) were submitted for interpretation post-operatively. COMPARISON:  10/22/2019 left hip radiographs FINDINGS: Fluoroscopy time 0 minutes 33 seconds. Nondiagnostic spot fluoroscopic intraoperative frontal left hip radiographs demonstrate interval left total hip arthroplasty with no evidence of hip dislocation on these frontal views. IMPRESSION: Intraoperative fluoroscopic guidance for left total hip arthroplasty. Electronically Signed   By: Ilona Sorrel M.D.   On: 11/23/2019 10:00   DG HIP OPERATIVE UNILAT W OR W/O PELVIS LEFT  Result Date: 11/23/2019 CLINICAL DATA:  Anterior left  total hip arthroplasty EXAM: OPERATIVE LEFT HIP (WITH PELVIS IF PERFORMED) 2 VIEWS TECHNIQUE: Fluoroscopic spot image(s) were submitted for interpretation post-operatively. COMPARISON:  10/22/2019 left hip radiographs FINDINGS: Fluoroscopy time 0 minutes 33 seconds. Nondiagnostic spot fluoroscopic intraoperative frontal left hip radiographs demonstrate interval left total hip arthroplasty with no evidence of hip dislocation on these frontal views. IMPRESSION: Intraoperative fluoroscopic guidance for left total hip arthroplasty. Electronically Signed   By: Delbert Phenix M.D.   On: 11/23/2019 10:00    Disposition: Discharge disposition: 01-Home or Self Care       Discharge Instructions    Call MD / Call 911   Complete by: As directed    If you experience chest pain or shortness of breath, CALL 911 and be transported to the hospital emergency room.  If you develope a fever above 101.5 F, pus (white drainage) or increased drainage or redness at the wound, or calf pain, call your surgeon's office.   Constipation Prevention   Complete by: As directed    Drink plenty of fluids.  Prune juice may be helpful.  You may use a stool  softener, such as Colace (over the counter) 100 mg twice a day.  Use MiraLax (over the counter) for constipation as needed.   Driving restrictions   Complete by: As directed    No driving while taking narcotic pain meds.   Increase activity slowly as tolerated   Complete by: As directed       Follow-up Information    Tarry Kos, MD In 2 weeks.   Specialty: Orthopedic Surgery Why: For suture removal, For wound re-check Contact information: [address] [place] [address] [phone]        [facility] Follow up.   Specialty: Home Health Services Why: home healthservices arranged Contact information: [address] [place] [address] [phone]            Signed: Glee Arvin 11/26/2019, 8:25 AM

## 2019-12-08 ENCOUNTER — Ambulatory Visit (INDEPENDENT_AMBULATORY_CARE_PROVIDER_SITE_OTHER): Payer: No Typology Code available for payment source

## 2019-12-08 ENCOUNTER — Other Ambulatory Visit: Payer: Self-pay

## 2019-12-08 ENCOUNTER — Ambulatory Visit (INDEPENDENT_AMBULATORY_CARE_PROVIDER_SITE_OTHER): Payer: No Typology Code available for payment source | Admitting: Orthopaedic Surgery

## 2019-12-08 ENCOUNTER — Telehealth: Payer: Self-pay | Admitting: Orthopaedic Surgery

## 2019-12-08 ENCOUNTER — Encounter: Payer: Self-pay | Admitting: Orthopaedic Surgery

## 2019-12-08 DIAGNOSIS — M1612 Unilateral primary osteoarthritis, left hip: Secondary | ICD-10-CM

## 2019-12-08 MED ORDER — MUPIROCIN 2 % EX OINT: 1.0000 "application " | TOPICAL_OINTMENT | Freq: Two times a day (BID) | CUTANEOUS | 0 refills | Status: DC

## 2019-12-08 MED ORDER — SULFAMETHOXAZOLE-TRIMETHOPRIM 800-160 MG PO TABS: 1.0000 | ORAL_TABLET | Freq: Two times a day (BID) | ORAL | 0 refills | Status: DC

## 2019-12-08 NOTE — Progress Notes (Signed)
Post-Op Visit Note   Patient: Erik Black           Date of Birth: 1950-07-08           MRN: [id number] Visit Date: 12/08/2019 PCP: [facility]:  Chief Complaint:  Chief Complaint  Patient presents with  . Left Hip - Routine Post Op   Visit Diagnoses:  1. Primary osteoarthritis of left hip     Plan: Jash is a 2-week status post left total hip replacement.  He is doing well on mainly takes no pain medication at night.  He is ambulating without a crutch or cane at this time.  Surgical incision is healed.  I sent in prescription for mupirocin for the incision to place twice a day and I put him on another 10 days of Bactrim.  He will continue with home exercises.  Questions encouraged and answered.  Continue with aspirin for DVT prophylaxis.  Follow-up in 4 weeks with standing AP pelvis.  Follow-Up Instructions: Return in about 4 weeks (around 01/05/2020).   Orders:  Orders Placed This Encounter  Procedures  . XR HIP UNILAT W OR W/O PELVIS 2-3 VIEWS LEFT   Meds ordered this encounter  Medications  . sulfamethoxazole-trimethoprim (BACTRIM DS) 800-160 MG tablet    Sig: Take 1 tablet by mouth 2 (two) times daily.    Dispense:  20 tablet    Refill:  0  . mupirocin ointment (BACTROBAN) 2 %    Sig: Apply 1 application topically 2 (two) times daily.    Dispense:  22 g    Refill:  0    Imaging: XR HIP UNILAT W OR W/O PELVIS 2-3 VIEWS LEFT  Result Date: 12/08/2019 Stable total hip replacement without complication   PMFS History: Patient Active Problem List   Diagnosis Date Noted  . Status post total replacement of left hip 11/23/2019  . Primary osteoarthritis of left hip 10/22/2019  . Status post total replacement of right hip 05/11/2019   Past Medical History:  Diagnosis Date  . Anxiety   . Arthritis   . Depression   . Diabetes mellitus without complication ([facility])   . GERD (gastroesophageal reflux disease)   . Glaucoma    both  eyes  . Hypertension   . Sleep apnea    uses Cpap nightly    History reviewed. No pertinent family history.  Past Surgical History:  Procedure Laterality Date  . CARPAL TUNNEL RELEASE Bilateral   . COLONOSCOPY    . EYE SURGERY Bilateral    cataract surgery with lens implants  . KNEE ARTHROPLASTY Right   . KNEE ARTHROPLASTY Left   . KNEE ARTHROSCOPY Right   . KNEE ARTHROSCOPY Left   . MULTIPLE TOOTH EXTRACTIONS    . ROTATOR CUFF REPAIR Right   . ROTATOR CUFF REPAIR W/ DISTAL CLAVICLE EXCISION Left   . TOTAL HIP ARTHROPLASTY Right 05/11/2019   Procedure: RIGHT TOTAL HIP ARTHROPLASTY ANTERIOR APPROACH;  Surgeon: Leandrew Koyanagi, MD;  Location: [facility];  Service: Orthopedics;  Laterality: Right;  . TOTAL HIP ARTHROPLASTY Left 11/23/2019  . TOTAL HIP ARTHROPLASTY Left 11/23/2019   Procedure: LEFT TOTAL HIP ARTHROPLASTY ANTERIOR APPROACH;  Surgeon: Leandrew Koyanagi, MD;  Location: [facility];  Service: Orthopedics;  Laterality: Left;  Marland Kitchen VASECTOMY     Social History   Occupational History  . Not on file  Tobacco Use  . Smoking status: Former Smoker    Types: Cigarettes  Quit date: 05/26/2019    Years since quitting: 0.5  . Smokeless tobacco: Never Used  . Tobacco comment: smokes a pack a month  Substance and Sexual Activity  . Alcohol use: Not Currently  . Drug use: Not Currently  . Sexual activity: Not on file

## 2019-12-08 NOTE — Telephone Encounter (Signed)
Patient request hip xrays (left & right) on disc, mailed to him if possible. If not pt will pick up

## 2020-01-01 IMAGING — CR DG CHEST 2V
2 series · 2 of 2 positions shown · non-contrast
Comparison: None.

CLINICAL DATA: Preoperative

EXAM:
CHEST - 2 VIEW

[w chest pa]
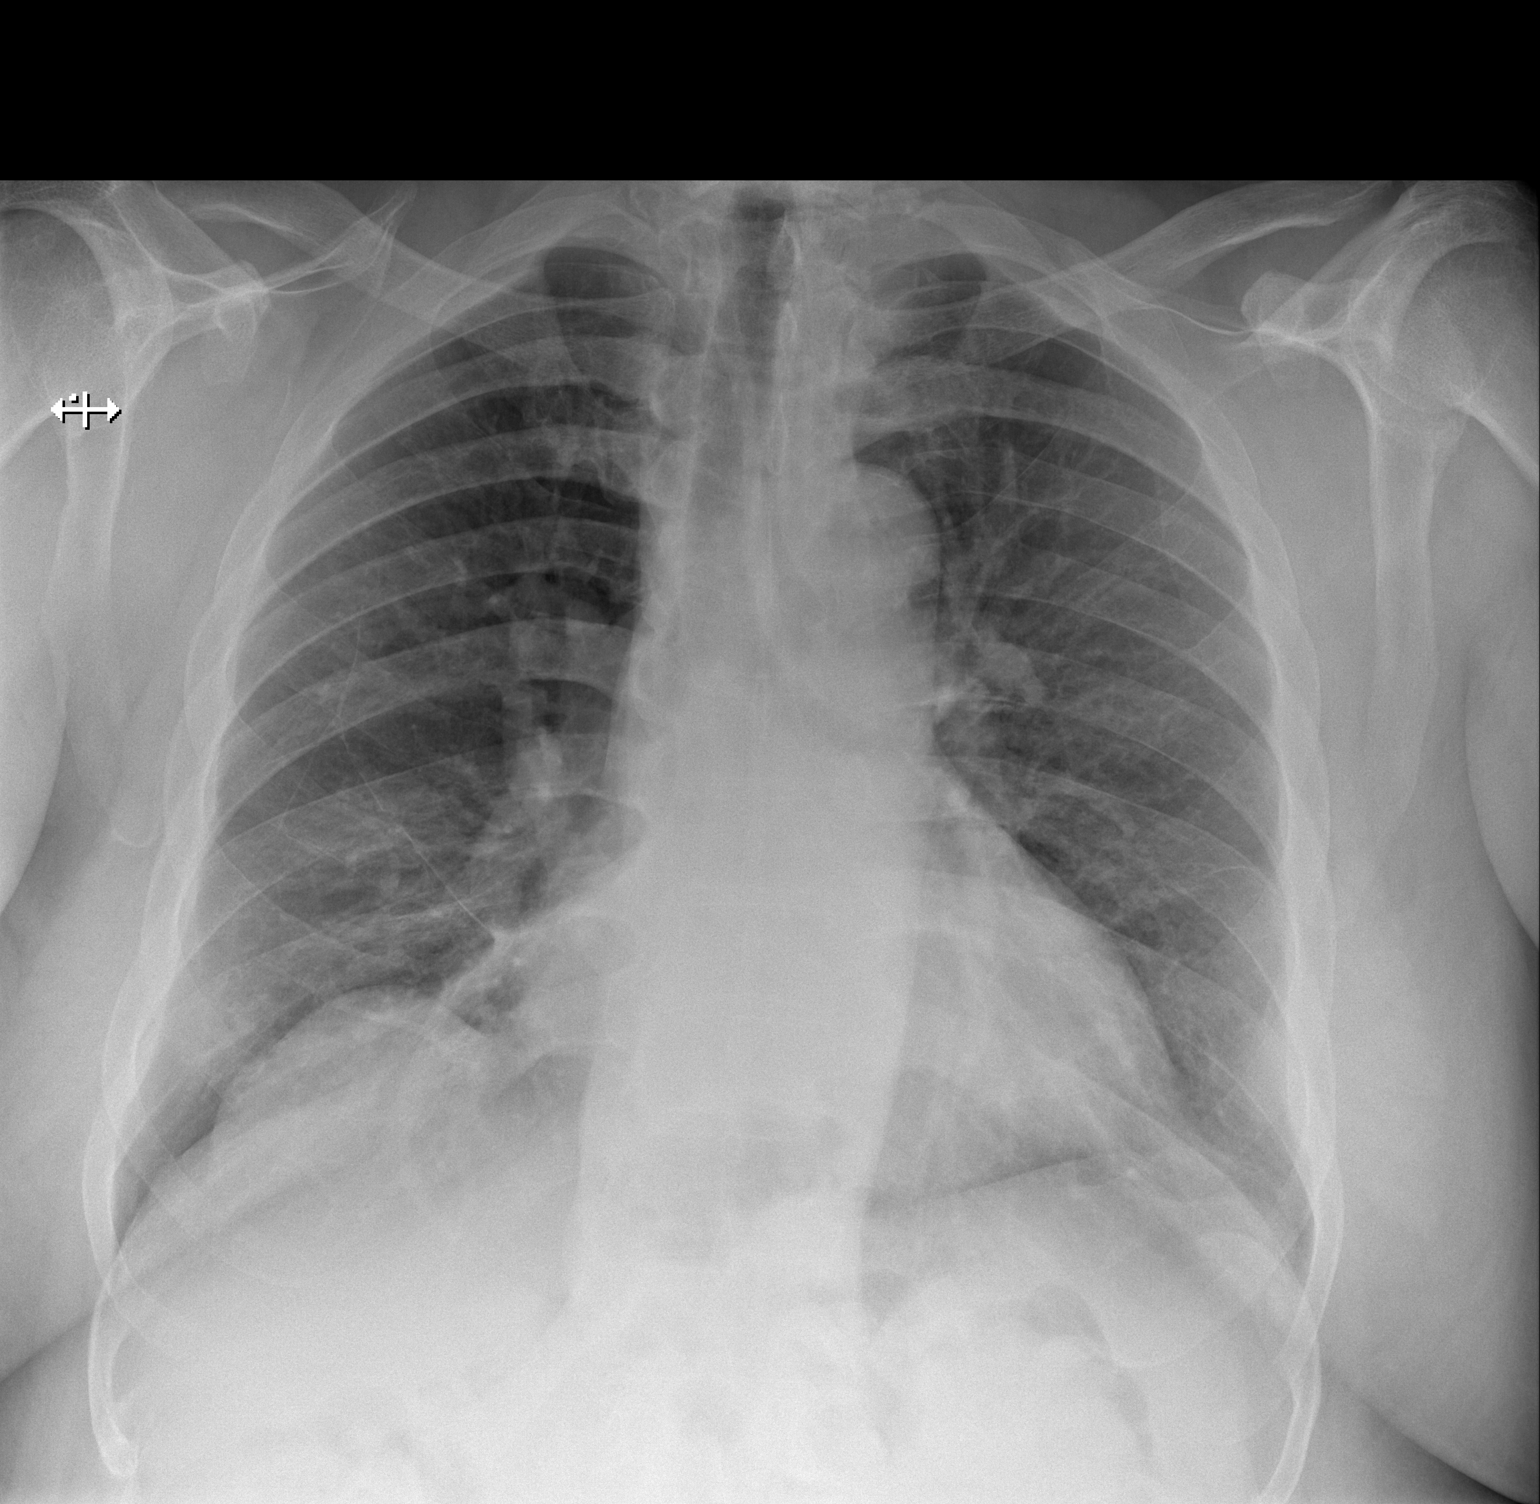

[w chest lat]
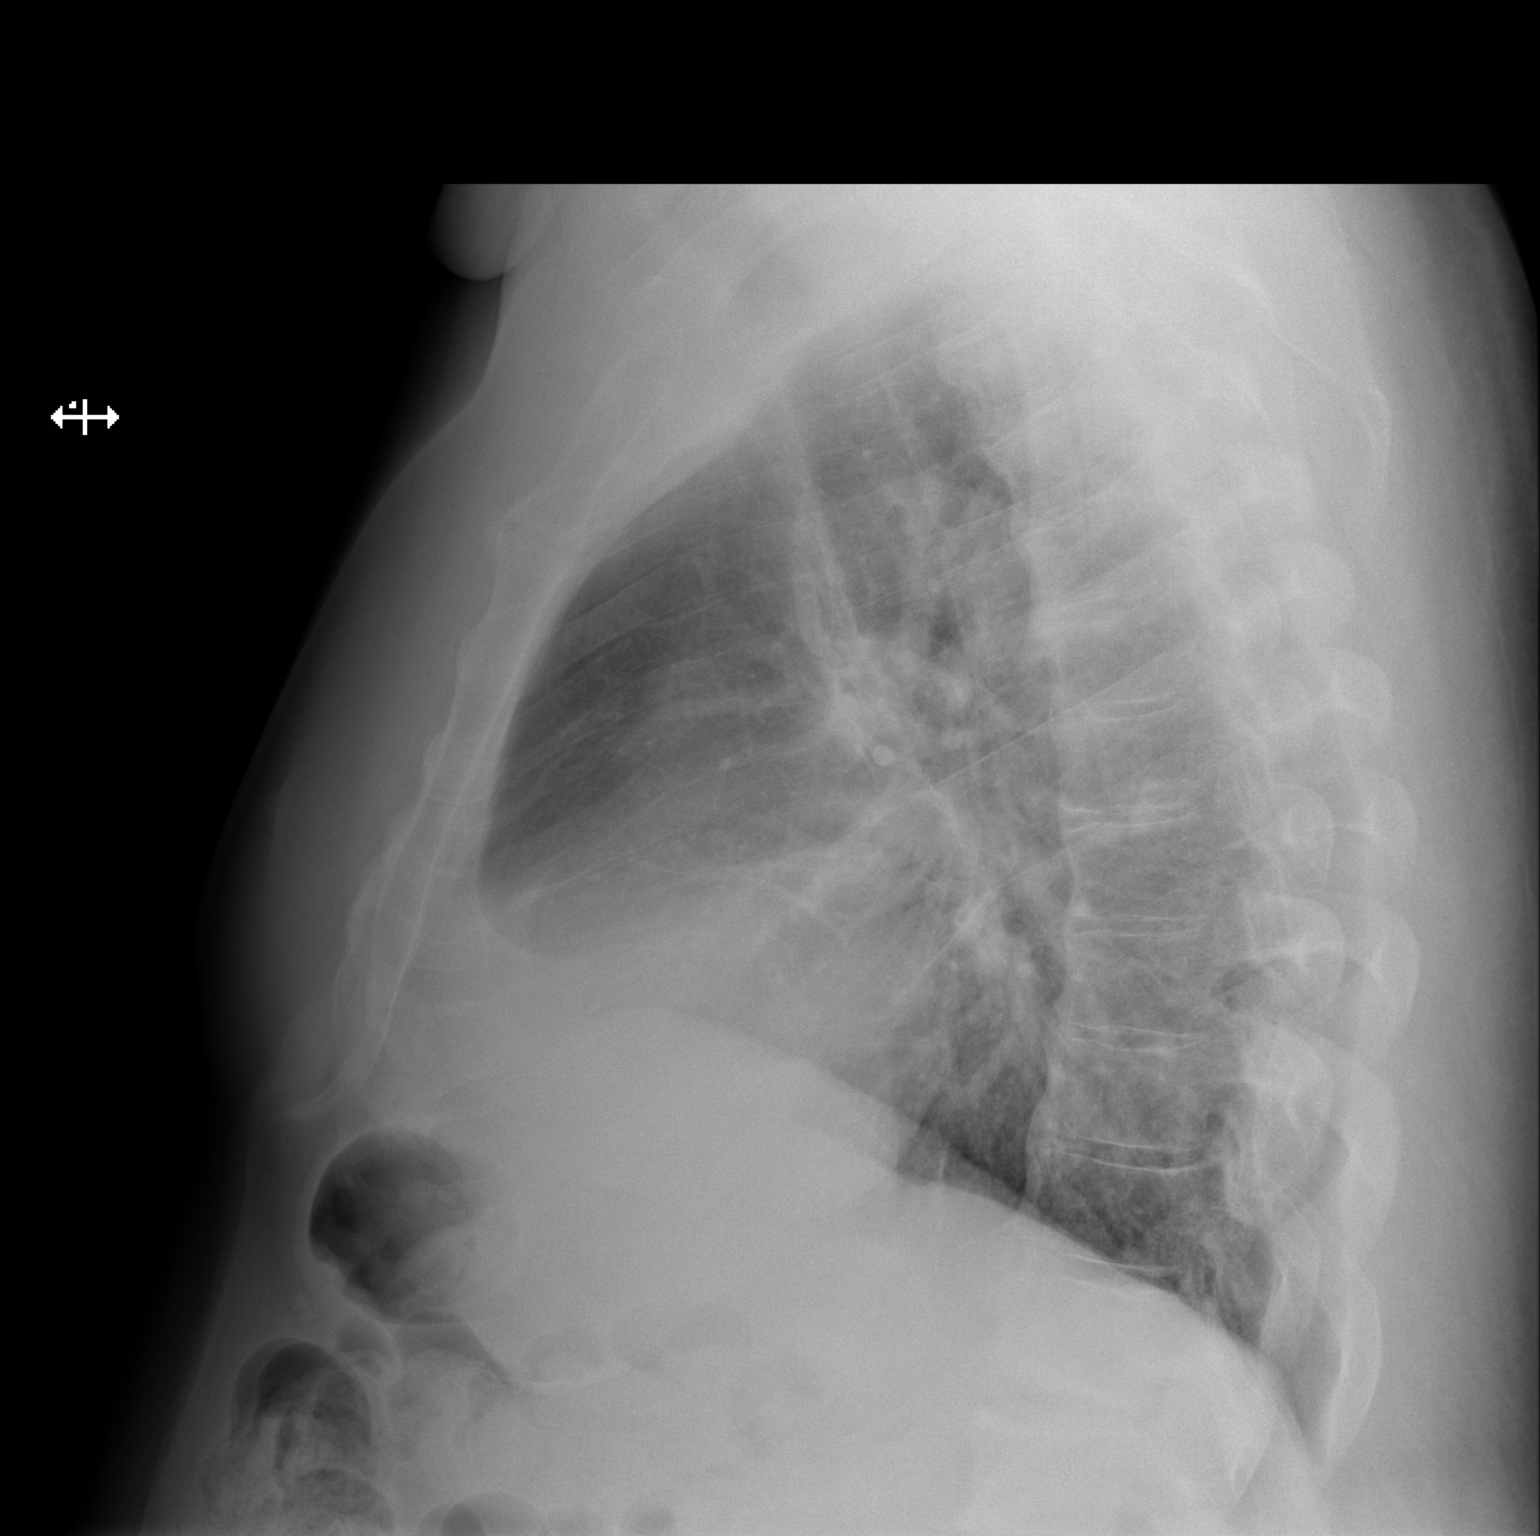

[2 of 2 positions shown; findings below may reference images not displayed]

FINDINGS: Cardiomegaly. Mild eventration of the right hemidiaphragm. Disc
degenerative disease and ankylosis of the thoracic spine.
IMPRESSION: 1. No acute abnormality of the lungs. Mild eventration of the right
hemidiaphragm.

2. cardiomegaly.

## 2020-01-05 ENCOUNTER — Ambulatory Visit (INDEPENDENT_AMBULATORY_CARE_PROVIDER_SITE_OTHER): Payer: No Typology Code available for payment source

## 2020-01-05 ENCOUNTER — Ambulatory Visit (INDEPENDENT_AMBULATORY_CARE_PROVIDER_SITE_OTHER): Payer: No Typology Code available for payment source | Admitting: Orthopaedic Surgery

## 2020-01-05 ENCOUNTER — Encounter: Payer: Self-pay | Admitting: Orthopaedic Surgery

## 2020-01-05 VITALS — Ht 70.0 in | Wt 298.0 lb

## 2020-01-05 DIAGNOSIS — Z96642 Presence of left artificial hip joint: Secondary | ICD-10-CM

## 2020-01-05 DIAGNOSIS — M1612 Unilateral primary osteoarthritis, left hip: Secondary | ICD-10-CM

## 2020-01-05 DIAGNOSIS — Z96641 Presence of right artificial hip joint: Secondary | ICD-10-CM

## 2020-01-05 IMAGING — RF DG HIP (WITH PELVIS) OPERATIVE*R*
1 series · 2 of 2 positions shown · non-contrast
Comparison: RADIOGRAPHS DATED 01/24/2018

CLINICAL DATA: Primary osteoarthritis of the right hip.

EXAM:
OPERATIVE RIGHT HIP (WITH PELVIS IF PERFORMED) 1 VIEW
TECHNIQUE: Fluoroscopic spot image(s) were submitted for interpretation
post-operatively.

[Series 1: unknown protocol · 0.20mm/px · 2 of 2 slices shown]
[im 1/2]
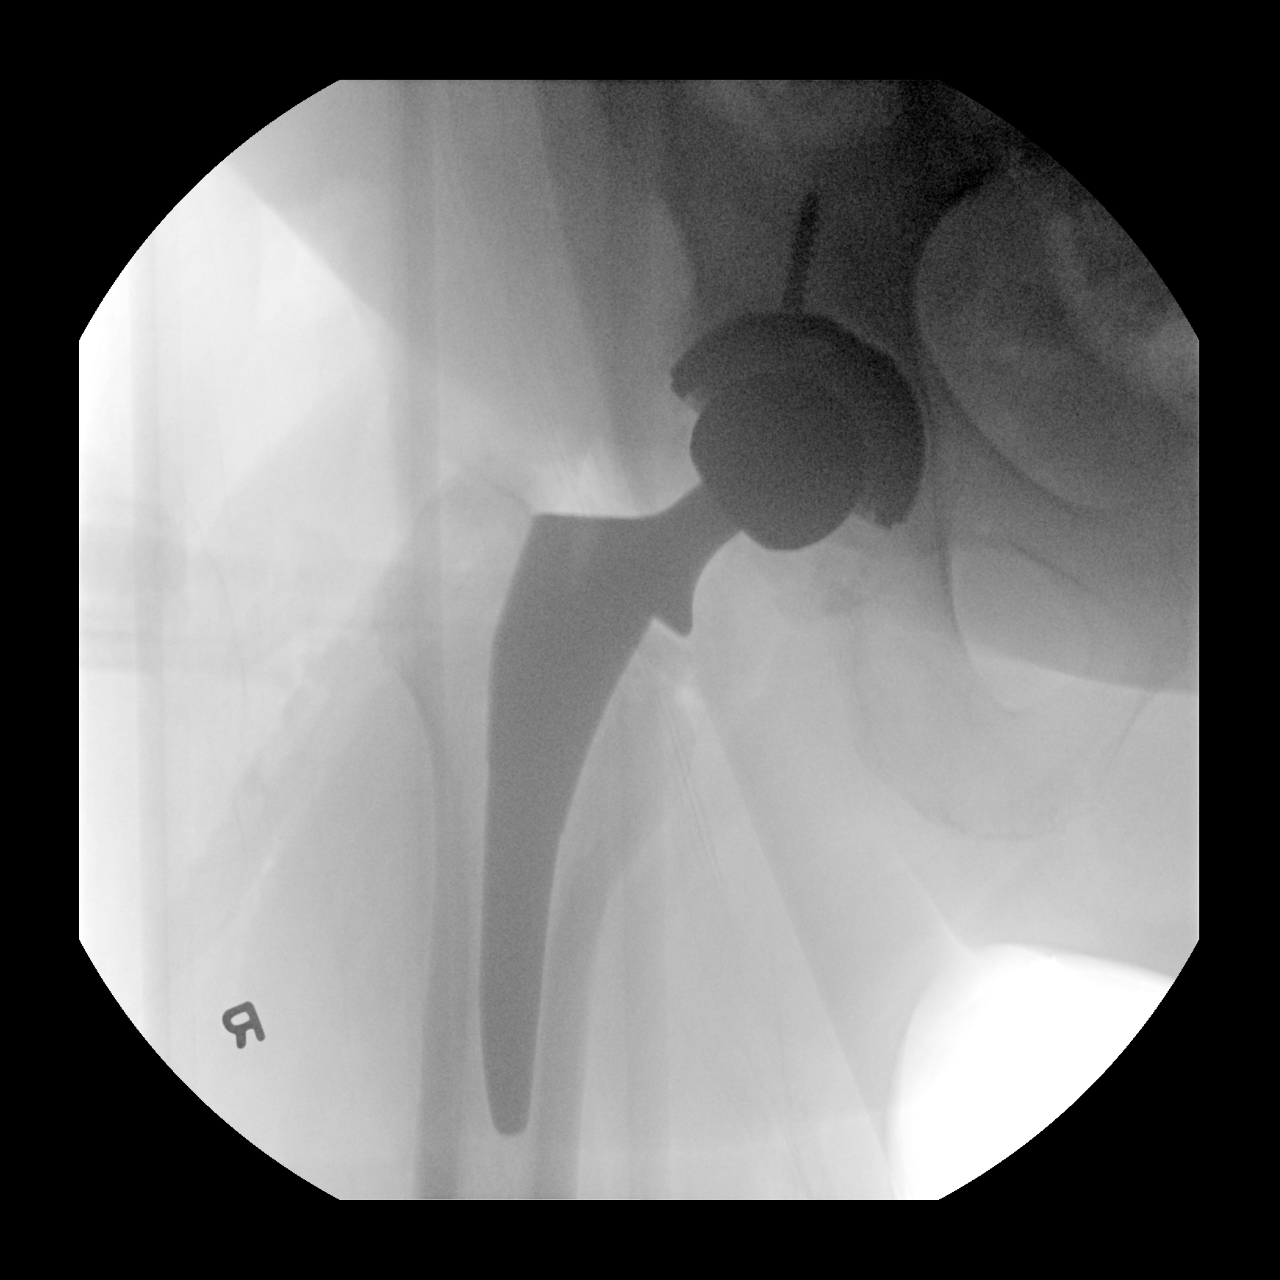
[im 2/2]
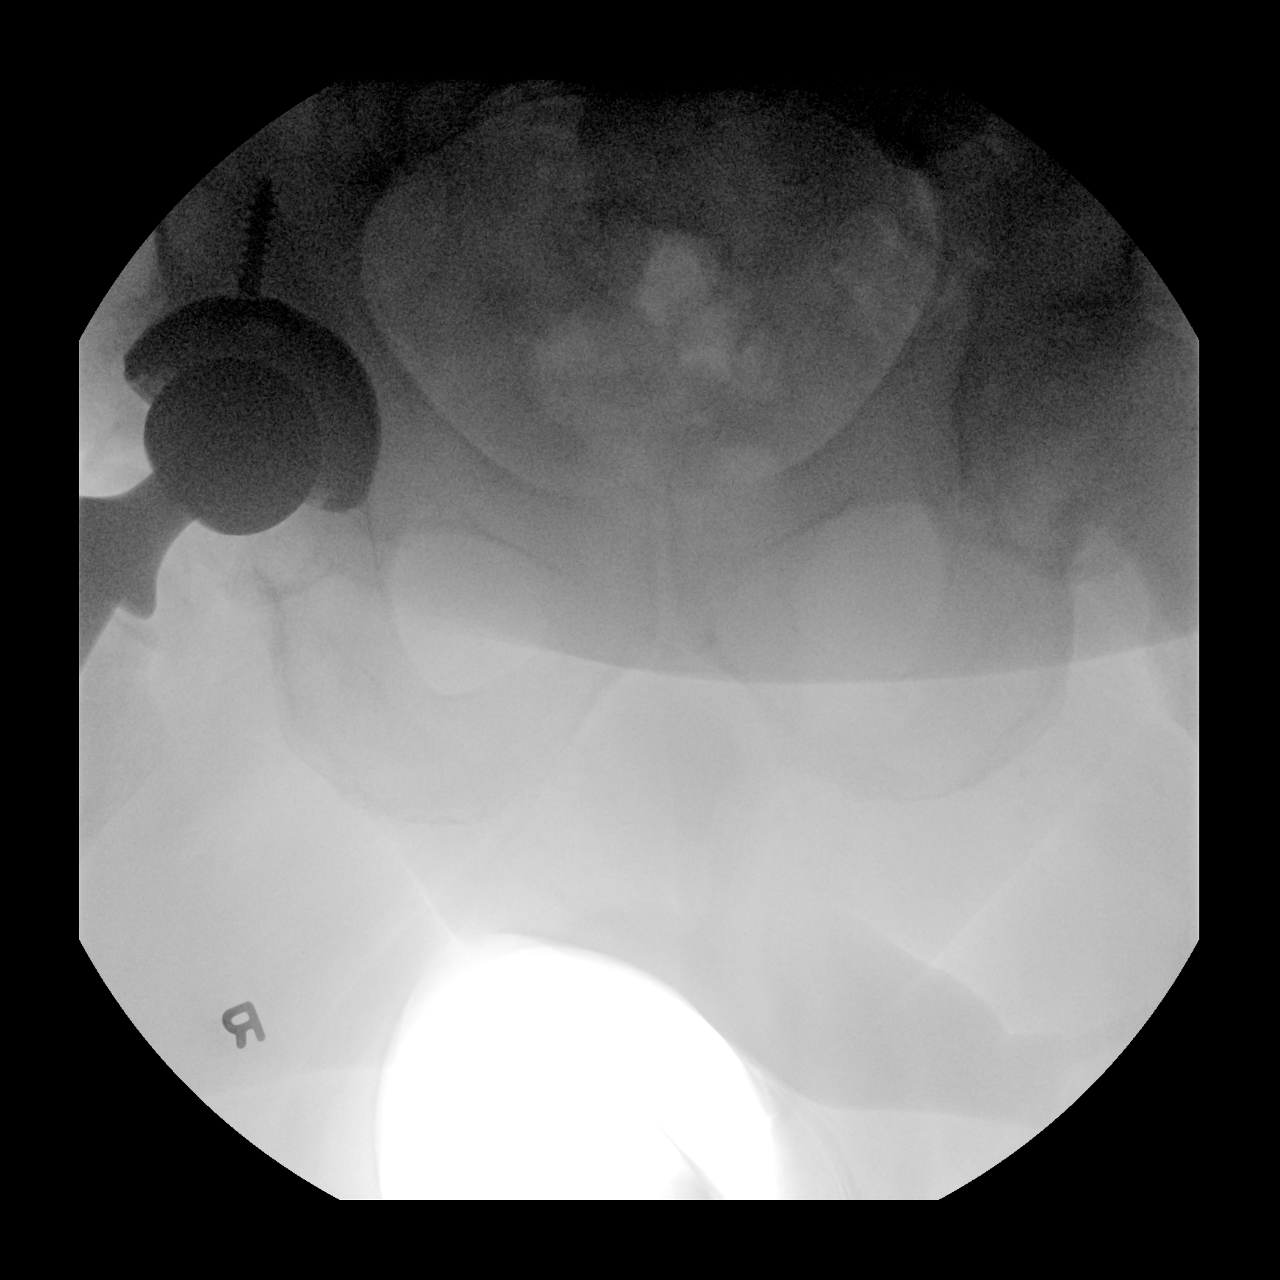

[2 of 2 positions shown; findings below may reference images not displayed]

FINDINGS: Two ap C-arm images demonstrate that the acetabular and femoral
components of the right total hip prosthesis appear in excellent
position in the ap projection. No visible fractures.
IMPRESSION: Satisfactory appearance of the right hip prosthesis.

## 2020-01-05 IMAGING — DX DG PORTABLE PELVIS
1 series · 1 of 1 positions shown · non-contrast
Comparison: None.

CLINICAL DATA: Hip joint replacement status

EXAM:
PORTABLE PELVIS 1-2 VIEWS

[pelvis ap]
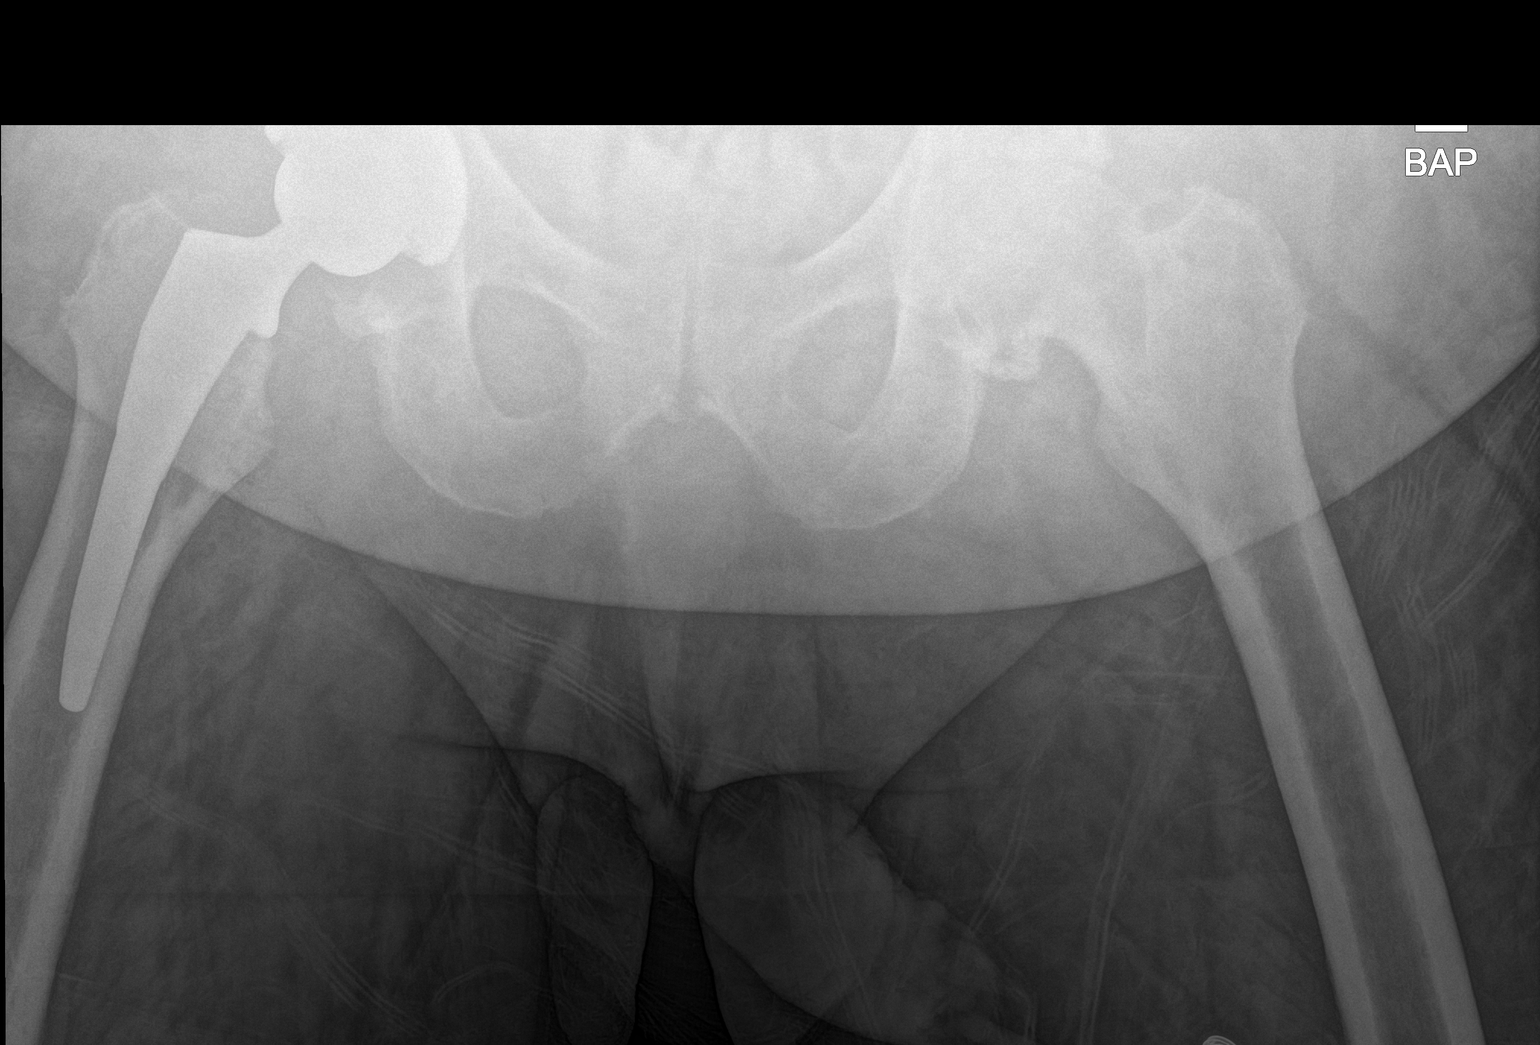

[1 of 1 positions shown; findings below may reference images not displayed]

FINDINGS: Total RIGHT hip arthroplasty. No fracture dislocation. Degenerative
change of the LEFT hip joint.
IMPRESSION: RIGHT total hip arthroplasty.

## 2020-01-05 NOTE — Progress Notes (Signed)
Post-Op Visit Note   Patient: Erik Black           Date of Birth: 07/28/1950           MRN: [id number] Visit Date: 01/05/2020 PCP: Erik Black, [facility]   Assessment & Plan:  Chief Complaint:  Chief Complaint  Patient presents with  . Left Hip - Follow-up    Left THA DOS 11-23-2019   Visit Diagnoses:  1. Primary osteoarthritis of left hip   2. Status post total replacement of right hip   3. Status post total replacement of left hip     Plan: Erik Black is a 6-week status post left total hip replacement.  Doing well has no complaints.  Surgical incisions healed.  No signs of infection or swelling.  He is moving around very well.  X-rays show stable hip replacements.  At this point Erik Black has done very well.  He has continued to lose weight.  He will increase activity as tolerated.  Dental prophylaxis reinforced.  Follow-up in 6 weeks.  Follow-Up Instructions: Return in about 6 weeks (around 02/16/2020).   Orders:  Orders Placed This Encounter  Procedures  . XR HIP UNILAT W OR W/O PELVIS 2-3 VIEWS LEFT   No orders of the defined types were placed in this encounter.   Imaging: XR HIP UNILAT W OR W/O PELVIS 2-3 VIEWS LEFT  Result Date: 01/05/2020 Stable total hip replacement without complication   PMFS History: Patient Active Problem List   Diagnosis Date Noted  . Status post total replacement of left hip 11/23/2019  . Primary osteoarthritis of left hip 10/22/2019  . Status post total replacement of right hip 05/11/2019   Past Medical History:  Diagnosis Date  . Anxiety   . Arthritis   . Depression   . Diabetes mellitus without complication (HCC)   . GERD (gastroesophageal reflux disease)   . Glaucoma    both eyes  . Hypertension   . Sleep apnea    uses Cpap nightly    History reviewed. No pertinent family history.  Past Surgical History:  Procedure Laterality Date  . CARPAL TUNNEL RELEASE Bilateral   . COLONOSCOPY    . EYE SURGERY  Bilateral    cataract surgery with lens implants  . KNEE ARTHROPLASTY Right   . KNEE ARTHROPLASTY Left   . KNEE ARTHROSCOPY Right   . KNEE ARTHROSCOPY Left   . MULTIPLE TOOTH EXTRACTIONS    . ROTATOR CUFF REPAIR Right   . ROTATOR CUFF REPAIR W/ DISTAL CLAVICLE EXCISION Left   . TOTAL HIP ARTHROPLASTY Right 05/11/2019   Procedure: RIGHT TOTAL HIP ARTHROPLASTY ANTERIOR APPROACH;  Surgeon: Tarry Kos, MD;  Location: [facility] OR;  Service: Orthopedics;  Laterality: Right;  . TOTAL HIP ARTHROPLASTY Left 11/23/2019  . TOTAL HIP ARTHROPLASTY Left 11/23/2019   Procedure: LEFT TOTAL HIP ARTHROPLASTY ANTERIOR APPROACH;  Surgeon: Tarry Kos, MD;  Location: [facility] OR;  Service: Orthopedics;  Laterality: Left;  Marland Kitchen VASECTOMY     Social History   Occupational History  . Not on file  Tobacco Use  . Smoking status: Former Smoker    Types: Cigarettes    Quit date: 05/26/2019    Years since quitting: 0.6  . Smokeless tobacco: Never Used  . Tobacco comment: smokes a pack a month  Vaping Use  . Vaping Use: Never used  Substance and Sexual Activity  . Alcohol use: Not Currently  . Drug use: Not Currently  . Sexual activity: Not  on file

## 2020-02-16 ENCOUNTER — Ambulatory Visit: Payer: No Typology Code available for payment source | Admitting: Orthopaedic Surgery

## 2022-02-05 ENCOUNTER — Other Ambulatory Visit: Payer: Self-pay

## 2022-02-05 ENCOUNTER — Emergency Department: Payer: No Typology Code available for payment source

## 2022-02-05 ENCOUNTER — Emergency Department
Admission: EM | Admit: 2022-02-05 | Discharge: 2022-02-06 | Disposition: A | Discharge status: Home / Self Care | Payer: No Typology Code available for payment source | Attending: Emergency Medicine | Admitting: Emergency Medicine

## 2022-02-05 ENCOUNTER — Encounter: Payer: Self-pay | Admitting: Emergency Medicine

## 2022-02-05 DIAGNOSIS — R079 Chest pain, unspecified: Secondary | ICD-10-CM | POA: Diagnosis present

## 2022-02-05 DIAGNOSIS — I110 Hypertensive heart disease with heart failure: Secondary | ICD-10-CM | POA: Insufficient documentation

## 2022-02-05 DIAGNOSIS — R0602 Shortness of breath: Secondary | ICD-10-CM | POA: Insufficient documentation

## 2022-02-05 DIAGNOSIS — Z96653 Presence of artificial knee joint, bilateral: Secondary | ICD-10-CM | POA: Insufficient documentation

## 2022-02-05 DIAGNOSIS — E119 Type 2 diabetes mellitus without complications: Secondary | ICD-10-CM | POA: Insufficient documentation

## 2022-02-05 DIAGNOSIS — Z96643 Presence of artificial hip joint, bilateral: Secondary | ICD-10-CM | POA: Insufficient documentation

## 2022-02-05 DIAGNOSIS — I509 Heart failure, unspecified: Secondary | ICD-10-CM | POA: Diagnosis not present

## 2022-02-05 DIAGNOSIS — Z7982 Long term (current) use of aspirin: Secondary | ICD-10-CM | POA: Diagnosis not present

## 2022-02-05 DIAGNOSIS — Z79899 Other long term (current) drug therapy: Secondary | ICD-10-CM | POA: Insufficient documentation

## 2022-02-05 LAB — CBC
HCT: 41.4 % (ref 39.0–52.0)
Hemoglobin: 13.5 g/dL (ref 13.0–17.0)
MCH: 28.4 pg (ref 26.0–34.0)
MCHC: 32.6 g/dL (ref 30.0–36.0)
MCV: 87.2 fL (ref 80.0–100.0)
Platelets: 119 10*3/uL — ABNORMAL LOW (ref 150–400)
RBC: 4.75 MIL/uL (ref 4.22–5.81)
RDW: 14 % (ref 11.5–15.5)
WBC: 6.1 10*3/uL (ref 4.0–10.5)
nRBC: 0 % (ref 0.0–0.2)

## 2022-02-05 LAB — BASIC METABOLIC PANEL
Anion gap: 8 (ref 5–15)
BUN: 13 mg/dL (ref 8–23)
CO2: 26 mmol/L (ref 22–32)
Calcium: 9.3 mg/dL (ref 8.9–10.3)
Chloride: 107 mmol/L (ref 98–111)
Creatinine, Ser: 1.11 mg/dL (ref 0.61–1.24)
GFR, Estimated: >60 mL/min (ref >60)
Glucose, Bld: 176 mg/dL — ABNORMAL HIGH (ref 70–99)
Potassium: 3.5 mmol/L (ref 3.5–5.1)
Sodium: 141 mmol/L (ref 135–145)

## 2022-02-05 LAB — BRAIN NATRIURETIC PEPTIDE: B Natriuretic Peptide: 633.4 pg/mL — ABNORMAL HIGH (ref 0.0–100.0)

## 2022-02-05 LAB — TROPONIN I (HIGH SENSITIVITY)
Troponin I (High Sensitivity): 13 ng/L (ref <18)
Troponin I (High Sensitivity): 14 ng/L (ref <18)

## 2022-02-05 NOTE — ED Triage Notes (Signed)
Patient arrives in wheelchair with wife c/o chest pain and shortness of breath x 3 weeks. Pain worse with inspiration. Patient saw his pulmonologist on 7/25 and CT chest showed cardiomegaly and given an inhaler. Patient reports his left foot swelling on Thursday and today began having left jaw swelling.

## 2022-02-05 NOTE — ED Provider Triage Note (Signed)
  Emergency Medicine Provider Triage Evaluation Note  Erik Black , a 70 y.o.male,  was evaluated in triage.  Pt complains of chest pain shortness of breath x 3 weeks.  Additionally states that he has been having swelling along the left side of his face/jaw that just started today.   Review of Systems  Positive: Chest pain, shortness of breath, left-sided facial swelling Negative: Denies fever, abdominal pain, vomiting  Physical Exam   Vitals:   02/05/22 1739  BP: 121/77  Pulse: 96  Resp: 20  Temp: 98.1 F (36.7 C)  SpO2: 97%   Gen:   Awake, no distress  Resp:  Normal effort  MSK:   Moves extremities without difficulty  Other:  Notable swelling on the left side of the face, just above the left mandible.  Medical Decision Making  Given the patient's initial medical screening exam, the following diagnostic evaluation has been ordered. The patient will be placed in the appropriate treatment space, once one is available, to complete the evaluation and treatment. I have discussed the plan of care with the patient and I have advised the patient that an ED physician or mid-level practitioner will reevaluate their condition after the test results have been received, as the results may give them additional insight into the type of treatment they may need.    Diagnostics: Labs, EKG, CXR  Treatments: none immediately   Varney Daily, [place] 02/05/22 1827

## 2022-02-05 NOTE — ED Provider Notes (Signed)
[facility] Provider Note    Event Date/Time   First MD Initiated Contact with Patient 02/05/22 2311     (approximate)   History   Chest Pain   HPI  Erik Black is a 71 y.o. male who presents to the ED from home with chief complaint of chest pain and shortness of breath x 3 weeks.  Patient saw his pulmonologist on 01/23/2022.  Obtain CT chest demonstrating cardiomegaly and mild pleural effusions.  He was started on inhaler.  Reports noticing left foot and left jaw swelling for several days.  Symptoms worsened by exertion and monitoring.  Patient does not currently take a diuretic.  Denies recent fever, cough, abdominal pain, nausea, vomiting or dizziness.     Past Medical History   Past Medical History:  Diagnosis Date  . Anxiety   . Arthritis   . Depression   . Diabetes mellitus without complication (HCC)   . GERD (gastroesophageal reflux disease)   . Glaucoma    both eyes  . Hypertension   . Sleep apnea    uses Cpap nightly     Active Problem List   Patient Active Problem List   Diagnosis Date Noted  . Status post total replacement of left hip 11/23/2019  . Primary osteoarthritis of left hip 10/22/2019  . Status post total replacement of right hip 05/11/2019     Past Surgical History   Past Surgical History:  Procedure Laterality Date  . CARPAL TUNNEL RELEASE Bilateral   . COLONOSCOPY    . EYE SURGERY Bilateral    cataract surgery with lens implants  . KNEE ARTHROPLASTY Right   . KNEE ARTHROPLASTY Left   . KNEE ARTHROSCOPY Right   . KNEE ARTHROSCOPY Left   . MULTIPLE TOOTH EXTRACTIONS    . ROTATOR CUFF REPAIR Right   . ROTATOR CUFF REPAIR W/ DISTAL CLAVICLE EXCISION Left   . TOTAL HIP ARTHROPLASTY Right 05/11/2019   Procedure: RIGHT TOTAL HIP ARTHROPLASTY ANTERIOR APPROACH;  Surgeon: Tarry Kos, MD;  Location: [facility] OR;  Service: Orthopedics;  Laterality: Right;  . TOTAL HIP ARTHROPLASTY Left 11/23/2019  . TOTAL HIP  ARTHROPLASTY Left 11/23/2019   Procedure: LEFT TOTAL HIP ARTHROPLASTY ANTERIOR APPROACH;  Surgeon: Tarry Kos, MD;  Location: [facility] OR;  Service: Orthopedics;  Laterality: Left;  Marland Kitchen VASECTOMY       Home Medications   Prior to Admission medications   Medication Sig Start Date End Date Taking? Authorizing Provider  amLODipine (NORVASC) 10 MG tablet Take 10 mg by mouth daily. 01/26/19   [provider]  aspirin EC 81 MG tablet Take 1 tablet (81 mg total) by mouth 2 (two) times daily. 05/11/19   Tarry Kos, MD  aspirin EC 81 MG tablet Take 1 tablet (81 mg total) by mouth 2 (two) times daily. 11/22/19   Tarry Kos, MD  atorvastatin (LIPITOR) 40 MG tablet Take 20 mg by mouth at bedtime.    [provider]  Cholecalciferol (VITAMIN D) 50 MCG (2000 UT) tablet Take 4,000 Units by mouth daily.  03/03/19   [provider]  diphenhydrAMINE (BENADRYL) 25 MG tablet Take 25 mg by mouth daily as needed for allergies.    [provider]  doxazosin (CARDURA) 8 MG tablet Take 4 mg by mouth 2 (two) times daily.  01/16/19   [provider]  DULoxetine (CYMBALTA) 60 MG capsule Take 60 mg by mouth daily. 01/07/19   [provider]  fluticasone Aleda Grana)  50 MCG/ACT nasal spray Place 1 spray into both nostrils as needed for allergies.  04/04/19   [provider]  hydrochlorothiazide (HYDRODIURIL) 25 MG tablet Take 25 mg by mouth daily. 01/16/19   [provider]  latanoprost (XALATAN) 0.005 % ophthalmic solution Place 1 drop into both eyes at bedtime.    [provider]  losartan (COZAAR) 100 MG tablet Take 100 mg by mouth daily.    [provider]  methocarbamol (ROBAXIN) 750 MG tablet Take 1 tablet (750 mg total) by mouth 2 (two) times daily as needed for muscle spasms. 11/22/19   Tarry Kos, MD  metoprolol tartrate (LOPRESSOR) 50 MG tablet Take 50 mg by mouth 2 (two) times daily.  01/16/19   [provider]  Multiple  Vitamins-Minerals (MULTIVITAMIN WITH MINERALS) tablet Take 1 tablet by mouth daily.    [provider]  mupirocin ointment (BACTROBAN) 2 % Apply 1 application topically 2 (two) times daily. 12/08/19   Tarry Kos, MD  Omega-3 1000 MG CAPS Take 2,000 mg by mouth 3 (three) times daily after meals.    [provider]  omeprazole (PRILOSEC) 40 MG capsule Take 40 mg by mouth daily.    [provider]  ondansetron (ZOFRAN) 4 MG tablet Take 1-2 tablets (4-8 mg total) by mouth every 8 (eight) hours as needed for nausea or vomiting. 11/22/19   Tarry Kos, MD  oxyCODONE-acetaminophen (PERCOCET) 5-325 MG tablet Take 1-2 tablets by mouth every 8 (eight) hours as needed for severe pain. 11/22/19   Tarry Kos, MD  phentermine (ADIPEX-P) 37.5 MG tablet Take 37.5 mg by mouth daily. 04/25/19   [provider]  potassium chloride SA (KLOR-CON) 20 MEQ tablet Take 40-60 mEq by mouth See admin instructions. 60 meq in the morning and 40 meq in the evening 04/07/19   [provider]  Semaglutide,0.25 or 0.5MG /DOS, (OZEMPIC, 0.25 OR 0.5 MG/DOSE,) 2 MG/1.5ML SOPN Inject 0.35 mg into the skin every Tuesday.     [provider]  SIMBRINZA 1-0.2 % SUSP Place 1 drop into both eyes 3 (three) times daily. 04/04/19   [provider]  sulfamethoxazole-trimethoprim (BACTRIM DS) 800-160 MG tablet Take 1 tablet by mouth 2 (two) times daily. 12/08/19   Tarry Kos, MD  timolol (TIMOPTIC-XR) 0.5 % ophthalmic gel-forming Place 1 drop into both eyes daily.    [provider]  topiramate (TOPAMAX) 50 MG tablet Take 50 mg by mouth 2 (two) times daily. 11/04/19   [provider]     Allergies  Oxycodone   Family History  History reviewed. No pertinent family history.   Physical Exam  Triage Vital Signs: ED Triage Vitals  Enc Vitals Group     BP 02/05/22 1739 121/77     Pulse Rate 02/05/22 1739 96     Resp 02/05/22 1739 20     Temp 02/05/22 1739  98.1 F (36.7 C)     Temp Source 02/05/22 1739 Oral     SpO2 02/05/22 1739 97 %     Weight 02/05/22 1740 285 lb (129.3 kg)     Height 02/05/22 1740 5\' 10"  (1.778 m)     Head Circumference --      Peak Flow --      Pain Score 02/05/22 1739 5     Pain Loc --      Pain Edu? --      Excl. in GC? --     Updated Vital Signs: BP  130/83 (BP Location: Left Arm)   Pulse 83   Temp 97.8 F (36.6 C) (Oral)   Resp 18   Ht 5\' 10"  (1.778 m)   Wt 129.3 kg   SpO2 95%   BMI 40.89 kg/m    General: Awake, mild distress.  CV:  RRR.  Good peripheral perfusion.  Resp:  Normal effort.  Faint bibasilar rales. Abd:  Nontender.  No distention.  Other:  Mild swelling to left jaw and left foot.  Bilateral calves are nontender and nonswollen.   ED Results / Procedures / Treatments  Labs (all labs ordered are listed, but only abnormal results are displayed) Labs Reviewed  BASIC METABOLIC PANEL - Abnormal; Notable for the following components:      Result Value   Glucose, Bld 176 (*)    All other components within normal limits  CBC - Abnormal; Notable for the following components:   Platelets 119 (*)    All other components within normal limits  BRAIN NATRIURETIC PEPTIDE - Abnormal; Notable for the following components:   B Natriuretic Peptide 633.4 (*)    All other components within normal limits  TROPONIN I (HIGH SENSITIVITY)  TROPONIN I (HIGH SENSITIVITY)     EKG  ED ECG REPORT I, Jancie Kercher J, the attending physician, personally viewed and interpreted this ECG.   Date: 02/05/2022  EKG Time: 1743  Rate: 97  Rhythm: normal sinus rhythm  Axis: Normal  Intervals: QTc 482  ST&T Change: Nonspecific    RADIOLOGY I have independently visualized and interpreted patient's chest x-ray as well as noted the radiology interpretation:  X-ray: CHF  Official radiology report(s): DG Chest 2 View  Result Date: 02/05/2022 CLINICAL DATA:  Chest pain and shortness of breath for 3 weeks EXAM:  CHEST - 2 VIEW COMPARISON:  05/07/2019 FINDINGS: Frontal and lateral views of the chest demonstrate mild enlargement the cardiac silhouette. There is increased central vascular congestion, with patchy bilateral airspace disease greatest at the lung bases. Small bilateral pleural effusions. No pneumothorax. No acute bony abnormalities. IMPRESSION: 1. Congestive heart failure. Electronically Signed   By: 13/10/2018 M.D.   On: 02/05/2022 18:23     PROCEDURES:  Critical Care performed: No  .1-3 Lead EKG Interpretation  Performed by: 04/07/2022, MD Authorized by: Irean Hong, MD     Interpretation: normal     ECG rate:  97   ECG rate assessment: normal     Rhythm: sinus rhythm     Ectopy: none     Conduction: normal   Comments:     Patient placed cardiac monitor to evaluate for arrhythmias    MEDICATIONS ORDERED IN ED: Medications  furosemide (LASIX) injection 20 mg (20 mg Intravenous Given 02/06/22 0052)     IMPRESSION / MDM / ASSESSMENT AND PLAN / ED COURSE  I reviewed the triage vital signs and the nursing notes.                             71 year old male presenting with a 3-week history of chest pain and shortness of breath, worsened with exertion. Differential includes, but is not limited to, viral syndrome, bronchitis including COPD exacerbation, pneumonia, reactive airway disease including asthma, CHF including exacerbation with or without pulmonary/interstitial edema, pneumothorax, ACS, thoracic trauma, and pulmonary embolism.  I have personally reviewed patient's records and my note his pulmonology CT scan results as well as his medication list both from his wife  provides paper copies.  Patient's presentation is most consistent with acute, life-threatening illness.  The patient is on the cardiac monitor to evaluate for evidence of arrhythmia and/or significant heart rate changes.  Laboratory results demonstrate normal WBC, normal kidney function, lites and negative  troponin, chest x-ray consistent with CHF.  Patient is not hypoxic while resting on room air.  Will perform ambulation trial.  Consider dose of IV Lasix.  Clinical Course as of 02/06/22 0123  Tue Feb 06, 2022  0010 Patient ambulated well with steady gait.  He was not tachypneic and her saturation is 96%.  Will administer IV diuresis in the ED. [JS]  0122  UOP already greater than 400 mL.  Will discharge home with 1 week Lasix 20 mg and refer to failure clinic.  Strict return precautions given.  Patient and spouse verbalized understanding agree with plan of care. [JS]    Clinical Course User Index [JS] Irean Hong, MD     FINAL CLINICAL IMPRESSION(S) / ED DIAGNOSES   Final diagnoses:  Acute on chronic congestive heart failure, unspecified heart failure type [facility])     Rx / DC Orders   ED Discharge Orders     None        Note:  This document was prepared using Dragon voice recognition software and may include unintentional dictation errors.   Irean Hong, MD 02/06/22 [phone]

## 2022-02-06 MED ORDER — FUROSEMIDE 20 MG PO TABS: 20.0000 mg | ORAL_TABLET | Freq: Every day | ORAL | 0 refills | Status: DC

## 2022-02-06 MED ORDER — FUROSEMIDE 10 MG/ML IJ SOLN
20.0000 mg | Freq: Once | INTRAMUSCULAR | Status: AC
  Administered 2022-02-06: 20 mg via INTRAVENOUS
  Filled 2022-02-06: qty 4

## 2022-02-06 NOTE — Discharge Instructions (Addendum)
Start Lasix 20 mg daily.  Return to the ER for worsening symptoms, persistent vomiting, difficulty breathing or other concerns.

## 2022-02-10 NOTE — Progress Notes (Unsigned)
   Patient ID: Erik Black, male    DOB: [birth year], 71 y.o.   MRN: [id number]  HPI  Mr Erik Black is a 71 y.o male with a history of  No previous echo done  Was in the ED 02/05/22 due to chest pain and shortness of breath due to acute on chronic heart failure. Given IV lasix with improvement of his symptoms and he was released.   He presents today for his initial visit with a chief complaint of   Review of Systems    Physical Exam    Assessment & Plan:  1: Chronic heart failure with most likely preserved ejection fraction- - NYHA class - have ordered echo for  - BNP 02/05/22 was 644/4  2: HTN- - BP - saw PCP at the [facility] 01/05/22 - BMP 02/05/22 reviewed and showed sodium 141, potassium 3.5, creatinine 1.11 and GFR >60  3: DM- - A1c 11/19/19 was 7.0%  4: Sleep apnea-

## 2022-02-12 ENCOUNTER — Other Ambulatory Visit
Admission: RE | Admit: 2022-02-12 | Discharge: 2022-02-12 | Disposition: A | Discharge status: Home / Self Care | Payer: Medicare HMO | Source: Ambulatory Visit | Attending: Family | Admitting: Family

## 2022-02-12 ENCOUNTER — Ambulatory Visit (HOSPITAL_BASED_OUTPATIENT_CLINIC_OR_DEPARTMENT_OTHER): Payer: Medicare HMO | Admitting: Family

## 2022-02-12 ENCOUNTER — Encounter: Payer: Self-pay | Admitting: Family

## 2022-02-12 VITALS — BP 162/95 | HR 92 | Resp 14 | Ht 72.0 in | Wt 285.2 lb

## 2022-02-12 DIAGNOSIS — Z79891 Long term (current) use of opiate analgesic: Secondary | ICD-10-CM | POA: Insufficient documentation

## 2022-02-12 DIAGNOSIS — F32A Depression, unspecified: Secondary | ICD-10-CM | POA: Insufficient documentation

## 2022-02-12 DIAGNOSIS — Z79899 Other long term (current) drug therapy: Secondary | ICD-10-CM | POA: Insufficient documentation

## 2022-02-12 DIAGNOSIS — Z87891 Personal history of nicotine dependence: Secondary | ICD-10-CM | POA: Insufficient documentation

## 2022-02-12 DIAGNOSIS — R079 Chest pain, unspecified: Secondary | ICD-10-CM | POA: Diagnosis present

## 2022-02-12 DIAGNOSIS — K219 Gastro-esophageal reflux disease without esophagitis: Secondary | ICD-10-CM | POA: Insufficient documentation

## 2022-02-12 DIAGNOSIS — G4730 Sleep apnea, unspecified: Secondary | ICD-10-CM | POA: Insufficient documentation

## 2022-02-12 DIAGNOSIS — F419 Anxiety disorder, unspecified: Secondary | ICD-10-CM | POA: Insufficient documentation

## 2022-02-12 DIAGNOSIS — R5383 Other fatigue: Secondary | ICD-10-CM | POA: Insufficient documentation

## 2022-02-12 DIAGNOSIS — G4733 Obstructive sleep apnea (adult) (pediatric): Secondary | ICD-10-CM | POA: Diagnosis not present

## 2022-02-12 DIAGNOSIS — I10 Essential (primary) hypertension: Secondary | ICD-10-CM

## 2022-02-12 DIAGNOSIS — E119 Type 2 diabetes mellitus without complications: Secondary | ICD-10-CM

## 2022-02-12 DIAGNOSIS — I5032 Chronic diastolic (congestive) heart failure: Secondary | ICD-10-CM | POA: Insufficient documentation

## 2022-02-12 DIAGNOSIS — R0789 Other chest pain: Secondary | ICD-10-CM | POA: Insufficient documentation

## 2022-02-12 DIAGNOSIS — I110 Hypertensive heart disease with heart failure: Secondary | ICD-10-CM | POA: Insufficient documentation

## 2022-02-12 DIAGNOSIS — M25512 Pain in left shoulder: Secondary | ICD-10-CM | POA: Insufficient documentation

## 2022-02-12 DIAGNOSIS — R0602 Shortness of breath: Secondary | ICD-10-CM | POA: Diagnosis not present

## 2022-02-12 LAB — BASIC METABOLIC PANEL
Anion gap: 8 (ref 5–15)
BUN: 14 mg/dL (ref 8–23)
CO2: 27 mmol/L (ref 22–32)
Calcium: 9.7 mg/dL (ref 8.9–10.3)
Chloride: 108 mmol/L (ref 98–111)
Creatinine, Ser: 1.15 mg/dL (ref 0.61–1.24)
GFR, Estimated: >60 mL/min (ref >60)
Glucose, Bld: 122 mg/dL — ABNORMAL HIGH (ref 70–99)
Potassium: 3.2 mmol/L — ABNORMAL LOW (ref 3.5–5.1)
Sodium: 143 mmol/L (ref 135–145)

## 2022-02-12 MED ORDER — FUROSEMIDE 20 MG PO TABS: 20.0000 mg | ORAL_TABLET | Freq: Every day | ORAL | 5 refills | Status: DC

## 2022-02-12 NOTE — Patient Instructions (Addendum)
Begin weighing daily and call for an overnight weight gain of 3 pounds or more or a weekly weight gain of more than 5 pounds.   If you have voicemail, please make sure your mailbox is cleaned out so that we may leave a message and please make sure to listen to any voicemails.    Doing labs today (BMET)  Resuming furosemide 20mg  and aspirin 81mg  daily  Echocardiogram has been scheduled on 03/07/22

## 2022-02-13 ENCOUNTER — Telehealth: Payer: Self-pay

## 2022-02-13 NOTE — Telephone Encounter (Addendum)
Reviewed lab results and message below with patient. Patient verbalized understanding and states he will begin taking 3 potassium tablets daily to equal 60 meq daily total. Patient denies any questions or concerns at this time. Suanne Marker, RN ----- Message from Delma Freeze, FNP sent at 02/13/2022  9:40 AM EDT ----- Kidney function looks great! Potassium is a little low so increase your daily potassium to 3 tablets daily ( total). You can take 2 tablets in the morning and the third later in the day. Will repeat your labs at next visit

## 2022-03-07 ENCOUNTER — Ambulatory Visit
Admission: RE | Admit: 2022-03-07 | Discharge: 2022-03-07 | Disposition: A | Discharge status: Home / Self Care | Payer: Medicare HMO | Source: Ambulatory Visit | Attending: Family | Admitting: Family

## 2022-03-07 DIAGNOSIS — I110 Hypertensive heart disease with heart failure: Secondary | ICD-10-CM | POA: Diagnosis not present

## 2022-03-07 DIAGNOSIS — R0602 Shortness of breath: Secondary | ICD-10-CM | POA: Diagnosis not present

## 2022-03-07 DIAGNOSIS — G4730 Sleep apnea, unspecified: Secondary | ICD-10-CM | POA: Insufficient documentation

## 2022-03-07 DIAGNOSIS — I340 Nonrheumatic mitral (valve) insufficiency: Secondary | ICD-10-CM | POA: Insufficient documentation

## 2022-03-07 DIAGNOSIS — R079 Chest pain, unspecified: Secondary | ICD-10-CM | POA: Diagnosis not present

## 2022-03-07 DIAGNOSIS — E119 Type 2 diabetes mellitus without complications: Secondary | ICD-10-CM | POA: Diagnosis not present

## 2022-03-07 DIAGNOSIS — I5032 Chronic diastolic (congestive) heart failure: Secondary | ICD-10-CM | POA: Insufficient documentation

## 2022-03-07 LAB — ECHOCARDIOGRAM COMPLETE
AR max vel: 2.23 cm2
AV Area VTI: 2.3 cm2
AV Area mean vel: 2.22 cm2
AV Mean grad: 5 mmHg
AV Peak grad: 8.3 mmHg
Ao pk vel: 1.44 m/s
Area-P 1/2: 5.66 cm2
Calc EF: 21.2 %
S' Lateral: 4.99 cm
Single Plane A2C EF: 21.3 %
Single Plane A4C EF: 13.2 %

## 2022-03-07 MED ORDER — PERFLUTREN LIPID MICROSPHERE
1.0000 mL | INTRAVENOUS | Status: AC | PRN
  Administered 2022-03-07: 2 mL via INTRAVENOUS

## 2022-03-07 NOTE — Progress Notes (Signed)
*  PRELIMINARY RESULTS* Echocardiogram 2D Echocardiogram has been performed.  [name] 03/07/2022, 12:08 PM

## 2022-03-09 ENCOUNTER — Encounter: Payer: Self-pay | Admitting: Family

## 2022-03-09 ENCOUNTER — Ambulatory Visit (HOSPITAL_BASED_OUTPATIENT_CLINIC_OR_DEPARTMENT_OTHER): Payer: Medicare HMO | Admitting: Family

## 2022-03-09 ENCOUNTER — Telehealth: Payer: Self-pay

## 2022-03-09 ENCOUNTER — Other Ambulatory Visit
Admission: RE | Admit: 2022-03-09 | Discharge: 2022-03-09 | Disposition: A | Discharge status: Home / Self Care | Payer: Medicare HMO | Source: Ambulatory Visit | Attending: Family | Admitting: Family

## 2022-03-09 VITALS — BP 145/130 | HR 91 | Resp 14 | Ht 72.0 in | Wt 281.0 lb

## 2022-03-09 DIAGNOSIS — E119 Type 2 diabetes mellitus without complications: Secondary | ICD-10-CM | POA: Diagnosis not present

## 2022-03-09 DIAGNOSIS — G4733 Obstructive sleep apnea (adult) (pediatric): Secondary | ICD-10-CM

## 2022-03-09 DIAGNOSIS — I10 Essential (primary) hypertension: Secondary | ICD-10-CM | POA: Diagnosis not present

## 2022-03-09 DIAGNOSIS — I5022 Chronic systolic (congestive) heart failure: Secondary | ICD-10-CM

## 2022-03-09 LAB — BASIC METABOLIC PANEL
Anion gap: 8 (ref 5–15)
BUN: 10 mg/dL (ref 8–23)
CO2: 27 mmol/L (ref 22–32)
Calcium: 9.7 mg/dL (ref 8.9–10.3)
Chloride: 105 mmol/L (ref 98–111)
Creatinine, Ser: 1.29 mg/dL — ABNORMAL HIGH (ref 0.61–1.24)
GFR, Estimated: >60 mL/min (ref >60)
Glucose, Bld: 118 mg/dL — ABNORMAL HIGH (ref 70–99)
Potassium: 3.3 mmol/L — ABNORMAL LOW (ref 3.5–5.1)
Sodium: 140 mmol/L (ref 135–145)

## 2022-03-09 MED ORDER — SACUBITRIL-VALSARTAN 49-51 MG PO TABS: 1.0000 | ORAL_TABLET | Freq: Two times a day (BID) | ORAL | 5 refills | Status: DC

## 2022-03-09 MED ORDER — POTASSIUM CHLORIDE CRYS ER 20 MEQ PO TBCR: 20.0000 meq | EXTENDED_RELEASE_TABLET | Freq: Three times a day (TID) | ORAL | 5 refills | Status: DC

## 2022-03-09 NOTE — Telephone Encounter (Addendum)
Spoke with patient to review lab results and message below. Patient verbalized understanding and had no questions or concerns at this time.  Suanne Marker, RN  ----- Message from Delma Freeze, FNP sent at 03/09/2022 12:42 PM EDT ----- Kidney function is within your range. Potassium is a little low but should improve since you are resuming your potassium tablets today. We will recheck your labs at your next visit in 1 month.

## 2022-03-09 NOTE — Patient Instructions (Addendum)
Continue weighing daily and call for an overnight weight gain of 3 pounds or more or a weekly weight gain of more than 5 pounds.   If you have voicemail, please make sure your mailbox is cleaned out so that we may leave a message and please make sure to listen to any voicemails.    Do not take anymore losartan. Begin entresto as 1 tablet twice a day.

## 2022-03-09 NOTE — Progress Notes (Signed)
Patient ID: Erik Black, male    DOB: 10/22/1950, 71 y.o.   MRN: [id number]  HPI  Erik Black is a 71 y.o male with a history of DM, HTN, anxiety, depression, GERD, sleep apnea, tobacco use and chronic heart failure.   Echo report from 03/07/22 reviewed and showed an EF of 30-35% along with mild LAE and mild Erik.   Was in the ED 02/05/22 due to chest pain and shortness of breath due to acute on chronic heart failure. Given IV lasix with improvement of his symptoms and he was released.   He presents today for his follow-up visit with a chief complaint of minimal shortness of breath with moderate exertion. Describes this as chronic in nature. He has associated fatigue, chest tightness and anxiety along with this. He denies any dizziness, difficulty sleeping, abdominal distention, palpitations, pedal edema, chest pain, cough or weight gain.   He says that he ran out of potassium a few days ago.   He admits to being very anxious regarding his health.   Past Medical History:  Diagnosis Date   Anxiety    Arthritis    CHF (congestive heart failure) (HCC)    Depression    Diabetes mellitus without complication (HCC)    GERD (gastroesophageal reflux disease)    Glaucoma    both eyes   Hypertension    Sleep apnea    uses Cpap nightly   Past Surgical History:  Procedure Laterality Date   CARPAL TUNNEL RELEASE Bilateral    COLONOSCOPY     EYE SURGERY Bilateral    cataract surgery with lens implants   KNEE ARTHROPLASTY Right    KNEE ARTHROPLASTY Left    KNEE ARTHROSCOPY Right    KNEE ARTHROSCOPY Left    MULTIPLE TOOTH EXTRACTIONS     ROTATOR CUFF REPAIR Right    ROTATOR CUFF REPAIR W/ DISTAL CLAVICLE EXCISION Left    TOTAL HIP ARTHROPLASTY Right 05/11/2019   Procedure: RIGHT TOTAL HIP ARTHROPLASTY ANTERIOR APPROACH;  Surgeon: Tarry Kos, MD;  Location: [facility];  Service: Orthopedics;  Laterality: Right;   TOTAL HIP ARTHROPLASTY Left 11/23/2019   TOTAL HIP ARTHROPLASTY Left  11/23/2019   Procedure: LEFT TOTAL HIP ARTHROPLASTY ANTERIOR APPROACH;  Surgeon: Tarry Kos, MD;  Location: [facility];  Service: Orthopedics;  Laterality: Left;   VASECTOMY     No family history on file. Social History   Tobacco Use   Smoking status: Former    Types: Cigarettes    Quit date: 05/26/2019    Years since quitting: 2.7   Smokeless tobacco: Never   Tobacco comments:    smokes a pack a month  Substance Use Topics   Alcohol use: Not Currently   Allergies  Allergen Reactions   Lisinopril    Methadone    Oxycodone Itching    Hands started peeling and "could not swallow"   Prior to Admission medications   Medication Sig Start Date End Date Taking? Authorizing Provider  amLODipine (NORVASC) 10 MG tablet Take 10 mg by mouth daily. 01/26/19  Yes [provider]  aspirin EC 81 MG tablet Take 1 tablet (81 mg total) by mouth 2 (two) times daily. 05/11/19  Yes Tarry Kos, MD  atorvastatin (LIPITOR) 40 MG tablet Take 20 mg by mouth at bedtime.   Yes [provider]  brimonidine (ALPHAGAN) 0.2 % ophthalmic solution Place 1 drop into both eyes 3 (three) times daily.   Yes [provider]  Cholecalciferol (VITAMIN D)  50 MCG (2000 UT) tablet Take 4,000 Units by mouth daily.  03/03/19  Yes [provider]  doxazosin (CARDURA) 8 MG tablet Take 4 mg by mouth 2 (two) times daily.  01/16/19  Yes [provider]  Dulaglutide (TRULICITY) 4.5 MG/0.5ML SOPN Inject 4.5 mg into the skin once a week.   Yes [provider]  DULoxetine (CYMBALTA) 60 MG capsule Take 60 mg by mouth daily. 01/07/19  Yes [provider]  furosemide (LASIX) 20 MG tablet Take 1 tablet (20 mg total) by mouth daily. 02/12/22  Yes Jeanae Whitmill A, FNP  latanoprost (XALATAN) 0.005 % ophthalmic solution Place 1 drop into both eyes at bedtime.   Yes [provider]  losartan (COZAAR) 100 MG tablet Take 100 mg by mouth daily.   Yes [provider]   metoprolol tartrate (LOPRESSOR) 50 MG tablet Take 50 mg by mouth 2 (two) times daily.  01/16/19  Yes [provider]  Multiple Vitamins-Minerals (MULTIVITAMIN WITH MINERALS) tablet Take 1 tablet by mouth daily.   Yes [provider]  Omega-3 1000 MG CAPS Take 2,000 mg by mouth 3 (three) times daily after meals.   Yes [provider]  omeprazole (PRILOSEC) 40 MG capsule Take 20 mg by mouth 2 (two) times daily.   Yes [provider]  timolol (TIMOPTIC-XR) 0.5 % ophthalmic gel-forming Place 1 drop into both eyes daily.   Yes [provider]  OXcarbazepine (TRILEPTAL) 150 MG tablet Take 150 mg by mouth 2 (two) times daily. Patient not taking: Reported on 03/09/2022    [provider]  potassium chloride SA (KLOR-CON) 20 MEQ tablet Take 60 mEq by mouth daily. Patient not taking: Reported on 03/09/2022 04/07/19   [provider]   Review of Systems  Constitutional:  Positive for fatigue. Negative for appetite change.  HENT:  Negative for congestion, postnasal drip and sore throat.   Eyes: Negative.   Respiratory:  Positive for chest tightness (at times) and shortness of breath ("improving"). Negative for cough.   Cardiovascular:  Negative for chest pain, palpitations and leg swelling.  Gastrointestinal:  Negative for abdominal distention and abdominal pain.  Endocrine: Negative.   Genitourinary: Negative.   Musculoskeletal:  Positive for arthralgias (left shoulder).  Skin: Negative.   Allergic/Immunologic: Negative.   Neurological:  Negative for dizziness and light-headedness.  Hematological:  Negative for adenopathy. Does not bruise/bleed easily.  Psychiatric/Behavioral:  Negative for dysphoric mood and sleep disturbance (wearing CPAP). The patient is nervous/anxious.    Vitals:   03/09/22 1014 03/09/22 1015  BP: (!) 160/105 (!) 145/130  Pulse: 91   Resp: 14   SpO2: 99%   Weight: 281 lb (127.5 kg)   Height: 6' (1.829 m)    Wt  Readings from Last 3 Encounters:  03/09/22 281 lb (127.5 kg)  02/12/22 285 lb 4 oz (129.4 kg)  02/05/22 285 lb (129.3 kg)   Lab Results  Component Value Date   CREATININE 1.15 02/12/2022   CREATININE 1.11 02/05/2022   CREATININE 1.42 (H) 11/24/2019   Physical Exam Vitals and nursing note reviewed. Exam conducted with a chaperone present (wife).  Constitutional:      Appearance: Normal appearance.  HENT:     Head: Normocephalic and atraumatic.  Cardiovascular:     Rate and Rhythm: Normal rate and regular rhythm.  Pulmonary:     Effort: Pulmonary effort is normal. No respiratory distress.     Breath sounds: No wheezing or rales.  Abdominal:  General: There is no distension.     Palpations: Abdomen is soft.  Musculoskeletal:        General: No tenderness.     Cervical back: Normal range of motion and neck supple.     Right lower leg: No edema.     Left lower leg: No edema.  Skin:    General: Skin is warm and dry.  Neurological:     General: No focal deficit present.     Mental Status: He is alert and oriented to person, place, and time.  Psychiatric:        Mood and Affect: Mood is anxious.        Behavior: Behavior normal.        Thought Content: Thought content normal.    Assessment & Plan:  1: Chronic heart failure with reduced ejection fraction- - NYHA class II - euvolemic today - weighing daily; reminded to call for an overnight weight gain of 3 pounds or more or a weekly weight gain of 5 pounds or more - weight down 4 pounds from last visit here 1 month ago - reviewed echo results with patient - on GDMT of losartan, metoprolol (although tartrate) - discussed other GDMT; will stop losartan and begin entresto 49/51mg  BID; 30 day voucher provided to him - will check BMP next visit - plan to change metoprolol to succinate formulation at next visit - not adding salt to his food and his wife says that they've been monitoring sodium intake for "years" - saw  cardiology [facility]) at [facility] last week - BNP 02/05/22 was 644/4  2: HTN- - BP elevated (160/105) and rechecked was 143/130 - changing losartn to entresto per above - saw PCP at the [facility] 02/26/22 - BMP 02/05/22 reviewed and showed sodium 141, potassium 3.5, creatinine 1.11 and GFR >60 - BMP today and RX for potassium sent to pharmacy  3: DM- - A1c 11/19/19 was 7.0%  4: Sleep apnea- - wears CPAP nightly - does see a pulmonologist   Medication list reviewed.   Return in 1 month, sooner if needed.

## 2022-04-04 ENCOUNTER — Encounter: Payer: Self-pay | Admitting: Family

## 2022-04-04 ENCOUNTER — Ambulatory Visit (HOSPITAL_BASED_OUTPATIENT_CLINIC_OR_DEPARTMENT_OTHER): Payer: Medicare HMO | Admitting: Family

## 2022-04-04 ENCOUNTER — Encounter: Payer: Self-pay | Admitting: Pharmacist

## 2022-04-04 ENCOUNTER — Other Ambulatory Visit
Admission: RE | Admit: 2022-04-04 | Discharge: 2022-04-04 | Disposition: A | Discharge status: Home / Self Care | Payer: Medicare HMO | Source: Ambulatory Visit | Attending: Family | Admitting: Family

## 2022-04-04 VITALS — BP 118/63 | HR 88 | Resp 18 | Ht 72.0 in | Wt 286.5 lb

## 2022-04-04 DIAGNOSIS — I5022 Chronic systolic (congestive) heart failure: Secondary | ICD-10-CM

## 2022-04-04 DIAGNOSIS — E119 Type 2 diabetes mellitus without complications: Secondary | ICD-10-CM | POA: Diagnosis not present

## 2022-04-04 DIAGNOSIS — I10 Essential (primary) hypertension: Secondary | ICD-10-CM | POA: Diagnosis not present

## 2022-04-04 DIAGNOSIS — G4733 Obstructive sleep apnea (adult) (pediatric): Secondary | ICD-10-CM

## 2022-04-04 LAB — BASIC METABOLIC PANEL
Anion gap: 8 (ref 5–15)
BUN: 13 mg/dL (ref 8–23)
CO2: 26 mmol/L (ref 22–32)
Calcium: 9.2 mg/dL (ref 8.9–10.3)
Chloride: 106 mmol/L (ref 98–111)
Creatinine, Ser: 1.12 mg/dL (ref 0.61–1.24)
GFR, Estimated: >60 mL/min (ref >60)
Glucose, Bld: 97 mg/dL (ref 70–99)
Potassium: 3.6 mmol/L (ref 3.5–5.1)
Sodium: 140 mmol/L (ref 135–145)

## 2022-04-04 MED ORDER — SEMAGLUTIDE(0.25 OR 0.5MG/DOS) 2 MG/3ML ~~LOC~~ SOPN: 0.2500 mg | PEN_INJECTOR | SUBCUTANEOUS | 5 refills | Status: DC

## 2022-04-04 NOTE — Patient Instructions (Addendum)
Continue weighing daily and call for an overnight weight gain of 3 pounds or more or a weekly weight gain of more than 5 pounds.   If you have voicemail, please make sure your mailbox is cleaned out so that we may leave a message and please make sure to listen to any voicemails.     Call [facility] to schedule an appointment  [phone]

## 2022-04-04 NOTE — Progress Notes (Signed)
Patient ID: Erik Black, male   DOB: Jun 10, 1951, 71 y.o.   MRN: [id number] [place] - PHARMACIST COUNSELING NOTE  Guideline-Directed Medical Therapy/Evidence Based Medicine  HFrEF 30-35%  ACE/ARB/ARNI: Sacubitril-valsartan 49-51 mg twice daily Beta Blocker: Metoprolol tartrate 50 mg twice daily Aldosterone Antagonist:  none Diuretic: Furosemide 40 mg daily SGLT2i:  none - yeast infection with Jardiance  Adherence Assessment  Do you ever forget to take your medication? [] Yes [x] No  Do you ever skip doses due to side effects? [] Yes [x] No  Do you have trouble affording your medicines? [] Yes [x] No  Are you ever unable to pick up your medication due to transportation difficulties? [] Yes [x] No  Do you ever stop taking your medications because you don't believe they are helping? [] Yes [x] No  Do you check your weight daily? [x] Yes [] No   Adherence strategy: pill box  Barriers to obtaining medications: none  Vital signs: HR 88, BP 118/63, weight (pounds) 286 lb ECHO: Date 03/07/2022, EF 30-35%, notes mild LAE and mild MR.     Latest Ref Rng & Units 04/04/2022   12:09 PM 03/09/2022   11:25 AM 02/12/2022   11:48 AM  BMP  Glucose 70 - 99 mg/dL 97  118  122   BUN 8 - 23 mg/dL 13  10  14    Creatinine 0.61 - 1.24 mg/dL 1.12  1.29  1.15   Sodium 135 - 145 mmol/L 140  140  143   Potassium 3.5 - 5.1 mmol/L 3.6  3.3  3.2   Chloride 98 - 111 mmol/L 106  105  108   CO2 22 - 32 mmol/L 26  27  27    Calcium 8.9 - 10.3 mg/dL 9.2  9.7  9.7     Past Medical History:  Diagnosis Date   Anxiety    Arthritis    CHF (congestive heart failure) (HCC)    Depression    Diabetes mellitus without complication (HCC)    GERD (gastroesophageal reflux disease)    Glaucoma    both eyes   Hypertension    Sleep apnea    uses Cpap nightly    ASSESSMENT 71 year old male who presents to the HF clinic follow up. PMH include DM, HTN, anxiety, depression,  GERD, sleep apnea, tobacco use and chronic heart failure. Patient working to establish care with [facility] provider and get GLP-1 with [employer].   Medication reconciliation completed during visit. He considers he has too many provider and causes confusion sometimes. Noted ADR with SGLT2i in the past.  [place]. Nop candidate for another SGLT2i Start Semaglutide 0.25mg  weekly if able to get from outpatient. Will try to get from [place] once paperwork for [facility] cardiology approved Follow up as instructed by NP Add spironolactone to therapy during last OV if renal function and BP stable.   Time spent: 20 minutes  Dannilynn Gallina Rodriguez-Guzman PharmD, BCPS 04/25/2022 3:46 PM   Current Outpatient Medications:    amLODipine (NORVASC) 10 MG tablet, Take 10 mg by mouth daily., Disp: , Rfl:    aspirin EC 81 MG tablet, Take 1 tablet (81 mg total) by mouth 2 (two) times daily., Disp: 84 tablet, Rfl: 0   atorvastatin (LIPITOR) 40 MG tablet, Take 20 mg by mouth at bedtime., Disp: , Rfl:    brimonidine (ALPHAGAN) 0.2 % ophthalmic solution, Place 1 drop into both eyes 3 (three) times daily., Disp: , Rfl:    Cholecalciferol (VITAMIN D) 50 MCG (2000  UT) tablet, Take 4,000 Units by mouth daily. , Disp: , Rfl:    doxazosin (CARDURA) 8 MG tablet, Take 4 mg by mouth 2 (two) times daily. , Disp: , Rfl:    DULoxetine (CYMBALTA) 60 MG capsule, Take 60 mg by mouth daily., Disp: , Rfl:    furosemide (LASIX) 20 MG tablet, Take 1 tablet (20 mg total) by mouth daily. (Patient taking differently: Take 20 mg by mouth daily. 2 tabs daily), Disp: 30 tablet, Rfl: 5   latanoprost (XALATAN) 0.005 % ophthalmic solution, Place 1 drop into both eyes at bedtime., Disp: , Rfl:    metoprolol tartrate (LOPRESSOR) 50 MG tablet, Take 50 mg by mouth 2 (two) times daily. , Disp: , Rfl:    Multiple Vitamins-Minerals (MULTIVITAMIN WITH MINERALS) tablet, Take 1 tablet by mouth daily., Disp: , Rfl:    Omega-3 1000 MG CAPS,  Take 2,000 mg by mouth 3 (three) times daily after meals., Disp: , Rfl:    omeprazole (PRILOSEC) 40 MG capsule, Take 20 mg by mouth 2 (two) times daily., Disp: , Rfl:    OXcarbazepine (TRILEPTAL) 150 MG tablet, Take 150 mg by mouth 2 (two) times daily., Disp: , Rfl:    potassium chloride SA (KLOR-CON M) 20 MEQ tablet, Take 1 tablet (20 mEq total) by mouth 3 (three) times daily., Disp: 90 tablet, Rfl: 5   sacubitril-valsartan (ENTRESTO) 49-51 MG, Take 1 tablet by mouth 2 (two) times daily., Disp: 60 tablet, Rfl: 5   Semaglutide,0.25 or 0.5MG /DOS, 2 MG/3ML SOPN, Inject 0.25 mg into the skin once a week., Disp: 3 mL, Rfl: 5   timolol (TIMOPTIC-XR) 0.5 % ophthalmic gel-forming, Place 1 drop into both eyes daily., Disp: , Rfl:     MEDICATION ADHERENCES TIPS AND STRATEGIES Taking medication as prescribed improves patient outcomes in heart failure (reduces hospitalizations, improves symptoms, increases survival) Side effects of medications can be managed by decreasing doses, switching agents, stopping drugs, or adding additional therapy. Please let someone in the Heart Failure Clinic know if you have having bothersome side effects so we can modify your regimen. Do not alter your medication regimen without talking to [place].  Medication reminders can help patients remember to take drugs on time. If you are missing or forgetting doses you can try linking behaviors, using pill boxes, or an electronic reminder like an alarm on your phone or an app. Some people can also get automated phone calls as medication reminders.

## 2022-04-04 NOTE — Progress Notes (Signed)
Patient ID: Erik Black, male    DOB: [birth year], 71 y.o.   MRN: [id number]  HPI  Mr Erik Black is Black 71 y.o male with Black history of DM, HTN, anxiety, depression, GERD, sleep apnea, tobacco use and chronic heart failure.   Echo report from 03/07/22 reviewed and showed an EF of 30-35% along with mild LAE and mild MR.   Was in the ED 02/05/22 due to chest pain and shortness of breath due to acute on chronic heart failure. Given IV lasix with improvement of his symptoms and he was released.   He presents today for his follow-up visit with Black chief complaint of moderate fatigue with minimal exertion. Describes this as chronic and says that he's tired "all the time". Has associated shortness of breath, chest pain, anxiety and difficulty sleeping along with this. Denies any abdominal distention, palpitations, pedal edema, cough, dizziness or weight gain.   Has Black history of yeast infection with jardiance.   Requests cardiology referral be placed specifically for Dr. Azucena Cecil so that he can get established with him.   Past Medical History:  Diagnosis Date   Anxiety    Arthritis    CHF (congestive heart failure) (HCC)    Depression    Diabetes mellitus without complication (HCC)    GERD (gastroesophageal reflux disease)    Glaucoma    both eyes   Hypertension    Sleep apnea    uses Cpap nightly   Past Surgical History:  Procedure Laterality Date   CARPAL TUNNEL RELEASE Bilateral    COLONOSCOPY     EYE SURGERY Bilateral    cataract surgery with lens implants   KNEE ARTHROPLASTY Right    KNEE ARTHROPLASTY Left    KNEE ARTHROSCOPY Right    KNEE ARTHROSCOPY Left    MULTIPLE TOOTH EXTRACTIONS     ROTATOR CUFF REPAIR Right    ROTATOR CUFF REPAIR W/ DISTAL CLAVICLE EXCISION Left    TOTAL HIP ARTHROPLASTY Right 05/11/2019   Procedure: RIGHT TOTAL HIP ARTHROPLASTY ANTERIOR APPROACH;  Surgeon: Tarry Kos, MD;  Location: [facility];  Service: Orthopedics;  Laterality: Right;   TOTAL HIP  ARTHROPLASTY Left 11/23/2019   TOTAL HIP ARTHROPLASTY Left 11/23/2019   Procedure: LEFT TOTAL HIP ARTHROPLASTY ANTERIOR APPROACH;  Surgeon: Tarry Kos, MD;  Location: [facility] OR;  Service: Orthopedics;  Laterality: Left;   VASECTOMY     No family history on file. Social History   Tobacco Use   Smoking status: Former    Types: Cigarettes    Quit date: 05/26/2019    Years since quitting: 2.8   Smokeless tobacco: Never   Tobacco comments:    smokes Black pack Black month  Substance Use Topics   Alcohol use: Not Currently   Allergies  Allergen Reactions   Methadone    Oxycodone Itching    Hands started peeling and "could not swallow"   Lisinopril Cough   Prior to Admission medications   Medication Sig Start Date End Date Taking? Authorizing Provider  amLODipine (NORVASC) 10 MG tablet Take 10 mg by mouth daily. 01/26/19  Yes [provider]  aspirin EC 81 MG tablet Take 1 tablet (81 mg total) by mouth 2 (two) times daily. 05/11/19  Yes Tarry Kos, MD  atorvastatin (LIPITOR) 40 MG tablet Take 20 mg by mouth at bedtime.   Yes [provider]  brimonidine (ALPHAGAN) 0.2 % ophthalmic solution Place 1 drop into both eyes 3 (three) times daily.   Yes [provider]  Cholecalciferol (VITAMIN D) 50 MCG (2000 UT) tablet Take 4,000 Units by mouth daily.  03/03/19  Yes [provider]  doxazosin (CARDURA) 8 MG tablet Take 4 mg by mouth 2 (two) times daily.  01/16/19  Yes [provider]  DULoxetine (CYMBALTA) 60 MG capsule Take 60 mg by mouth daily. 01/07/19  Yes [provider]  furosemide (LASIX) 20 MG tablet Take 1 tablet (20 mg total) by mouth daily. Patient taking differently: Take 20 mg by mouth daily. 2 tabs daily 02/12/22  Yes Erik Reiling A, FNP  latanoprost (XALATAN) 0.005 % ophthalmic solution Place 1 drop into both eyes at bedtime.   Yes [provider]  metoprolol tartrate (LOPRESSOR) 50 MG tablet Take 50 mg by mouth 2 (two) times  daily.  01/16/19  Yes [provider]  Multiple Vitamins-Minerals (MULTIVITAMIN WITH MINERALS) tablet Take 1 tablet by mouth daily.   Yes [provider]  Omega-3 1000 MG CAPS Take 2,000 mg by mouth 3 (three) times daily after meals.   Yes [provider]  omeprazole (PRILOSEC) 40 MG capsule Take 20 mg by mouth 2 (two) times daily.   Yes [provider]  OXcarbazepine (TRILEPTAL) 150 MG tablet Take 150 mg by mouth 2 (two) times daily.   Yes [provider]  potassium chloride SA (KLOR-CON M) 20 MEQ tablet Take 1 tablet (20 mEq total) by mouth 3 (three) times daily. 03/09/22  Yes Erik Negro A, FNP  sacubitril-valsartan (ENTRESTO) 49-51 MG Take 1 tablet by mouth 2 (two) times daily. 03/09/22  Yes Erik Harrower A, FNP  timolol (TIMOPTIC-XR) 0.5 % ophthalmic gel-forming Place 1 drop into both eyes daily.   Yes [provider]  Semaglutide,0.25 or 0.5MG /DOS, 2 MG/3ML SOPN Inject 0.25 mg into the skin once Black week. 04/04/22   Erik Graff, FNP    Review of Systems  Constitutional:  Positive for fatigue (easily). Negative for appetite change.  HENT:  Negative for congestion and sore throat.   Eyes: Negative.   Respiratory:  Positive for shortness of breath. Negative for cough.   Cardiovascular:  Positive for chest pain. Negative for palpitations.  Gastrointestinal:  Negative for abdominal distention and abdominal pain.  Endocrine: Negative.   Genitourinary: Negative.   Musculoskeletal:  Negative for back pain and neck pain.  Skin: Negative.   Allergic/Immunologic: Negative.   Neurological:  Negative for dizziness and light-headedness.  Hematological:  Negative for adenopathy. Does not bruise/bleed easily.  Psychiatric/Behavioral:  Positive for sleep disturbance (wearing CPAP; sleeping on 1 pillow). Negative for dysphoric mood. The patient is nervous/anxious.    Vitals:   04/04/22 1104  BP: 118/63  Pulse: 88  Resp: 18  SpO2: 99%  Weight:  286 lb 8 oz (130 kg)  Height: 6' (1.829 m)   Wt Readings from Last 3 Encounters:  04/04/22 286 lb 8 oz (130 kg)  03/09/22 281 lb (127.5 kg)  02/12/22 285 lb 4 oz (129.4 kg)   Lab Results  Component Value Date   CREATININE 1.29 (H) 03/09/2022   CREATININE 1.15 02/12/2022   CREATININE 1.11 02/05/2022   Physical Exam Vitals and nursing note reviewed. Exam conducted with Black chaperone present.  Constitutional:      Appearance: Normal appearance.  HENT:     Head: Normocephalic and atraumatic.  Cardiovascular:     Rate and Rhythm: Regular rhythm.  Pulmonary:     Effort: Pulmonary effort is normal. No respiratory distress.     Breath sounds:  No wheezing or rales.  Abdominal:     General: There is no distension.     Palpations: Abdomen is soft.  Musculoskeletal:        General: No tenderness.     Cervical back: Normal range of motion and neck supple.     Right lower leg: No edema.     Left lower leg: No edema.  Skin:    General: Skin is warm and dry.  Neurological:     General: No focal deficit present.     Mental Status: He is alert and oriented to person, place, and time.  Psychiatric:        Mood and Affect: Mood is anxious.        Behavior: Behavior normal.        Thought Content: Thought content normal.    Assessment & Plan:  1: Chronic heart failure with reduced ejection fraction- - NYHA class III - euvolemic today - weighing daily; reminded to call for an overnight weight gain of 3 pounds or more or Black weekly weight gain of 5 pounds or more - weight up 5 pounds from last visit here 1 month ago - on GDMT of entresto & metoprolol (although tartrate) - will check BMP today since he's now on entresto - trying to be more active but feels like he over does it - had yeast infection when jardiance previously tried - not adding salt to his food and his wife says that they've been monitoring sodium intake for "years" - saw cardiology Edson Snowball) at [facility] last  week; requests referral to [facility] and specifically Dr. Garen Lah; this was placed today - BNP 02/05/22 was 644/4 - PharmD reconciled medications with the patient  2: HTN- - BP looks good (118/63) - saw PCP at the [facility] 02/26/22 - BMP 03/09/22 reviewed and showed sodium 140, potassium 3.3, creatinine 1.29 and GFR >60  3: DM- - A1c 11/19/19 was 7.0%  4: Sleep apnea- - wears CPAP nightly - does see Black pulmonologist   Medication list reviewed.   Return in 1 month, sooner if needed.

## 2022-05-01 ENCOUNTER — Ambulatory Visit: Payer: Medicare HMO | Attending: Cardiology | Admitting: Cardiology

## 2022-05-01 ENCOUNTER — Encounter: Payer: Self-pay | Admitting: Cardiology

## 2022-05-01 VITALS — BP 138/86 | HR 81 | Ht 72.0 in | Wt 292.8 lb

## 2022-05-01 DIAGNOSIS — I10 Essential (primary) hypertension: Secondary | ICD-10-CM

## 2022-05-01 DIAGNOSIS — I2510 Atherosclerotic heart disease of native coronary artery without angina pectoris: Secondary | ICD-10-CM | POA: Diagnosis not present

## 2022-05-01 DIAGNOSIS — I5020 Unspecified systolic (congestive) heart failure: Secondary | ICD-10-CM

## 2022-05-01 DIAGNOSIS — E7800 Pure hypercholesterolemia, unspecified: Secondary | ICD-10-CM | POA: Diagnosis not present

## 2022-05-01 MED ORDER — CARVEDILOL 12.5 MG PO TABS: 12.5000 mg | ORAL_TABLET | Freq: Two times a day (BID) | ORAL | 5 refills | Status: DC

## 2022-05-01 NOTE — Patient Instructions (Signed)
Medication Instructions:   Your physician has recommended you make the following change in your medication:    STOP taking your Metoprolol Tartrate.  2.    STOP taking your Amlodipine.  3.    START taking Carvedilol (Coreg) 12.5 MG Twice a day.   *If you need a refill on your cardiac medications before your next appointment, please call your pharmacy*   Lab Work:  Your physician recommends that you return for a BMP, CBC, and FASTING lipid profile:   TOMORROW MORNING  - You will need to be fasting. Please do not have anything to eat or drink after midnight the morning you have the lab work. You may only have water or black coffee with no cream or sugar.   - Please go to the [facility]. You will check in at the front desk to the right as you walk into the atrium. Valet Parking is offered if needed. - No appointment needed. You may go any day between 7 am and 6 pm.    Testing/Procedures:   You are scheduled for a Cardiac Catheterization on Thursday, November 2 with Dr. Bryan Lemma.  1. [facility] at 12:30 PM (This time is one hour before your procedure to ensure your preparation). Free valet parking service is available.   Special note: Every effort is made to have your procedure done on time. Please understand that emergencies sometimes delay scheduled procedures.  2. Diet: Do not eat solid foods after midnight.  The patient may have clear liquids until 5am upon the day of the procedure.  3. Labs: You will need to have blood drawn on , November 1 at [facility], Go to 1st desk on your right to register.  Address: [address] [place], [place] [address]  Open: 8am - 5pm  Phone: [phone]. You do not need to be fasting.  4. Medication instructions in preparation for your procedure:   Contrast Allergy: No   Stop taking, Lasix (Furosemide)  Thursday, November 2,   On the morning of your procedure, take your Aspirin 81 mg  and any morning medicines NOT listed above.  You may use sips of water.  5. Plan for one night stay--bring personal belongings. 6. Bring a current list of your medications and current insurance cards. 7. You MUST have a responsible person to drive you home. 8. Someone MUST be with you the first 24 hours after you arrive home or your discharge will be delayed. 9. Please wear clothes that are easy to get on and off and wear slip-on shoes.  Thank you for allowing [place] to care for you!   -- [facility]]   Follow-Up: At [facility], you and your health needs are our priority.  As part of our continuing mission to provide you with exceptional heart care, we have created designated Provider Care Teams.  These Care Teams include your primary Cardiologist (physician) and Advanced Practice Providers (APPs -  Physician Assistants and Nurse Practitioners) who all work together to provide you with the care you need, when you need it.  We recommend signing up for the patient portal called "MyChart".  Sign up information is provided on this After Visit Summary.  MyChart is used to connect with patients for Virtual Visits (Telemedicine).  Patients are able to view lab/test results, encounter notes, upcoming appointments, etc.  Non-urgent messages can be sent to your provider as well.   To learn more  about what you can do with MyChart, go to [email or web address].    Your next appointment:   4-6 week(s)  The format for your next appointment:   In Person  Provider:    ONLY WITH Kate Sable, MD    Other Instructions    Important Information About Sugar

## 2022-05-01 NOTE — Progress Notes (Unsigned)
Cardiology Office Note:    Date:  05/01/2022   ID:  Erik Black, DOB 1950/12/27, MRN [id number]  PCP:  [facility]   [facility] Providers Cardiologist:  Debbe Odea, MD     Referring MD: [facility]   Chief Complaint  Patient presents with   New Patient (Initial Visit)    Chronic Heart Failure, chest pain     History of Present Illness:    Erik Black is a 71 y.o. male with a hx of hypertension, HFrEF EF 30 to 35%, diabetes, former smoker who presents to establish care due to CHF.  His diuretics were stopped by his primary care provider earlier this year, he is not sure why he was on a diuretic.  3 to 4 months later, he had worsening shortness of breath and edema.  Presented to the ED 2 months ago, was given IV Lasix with good effect, shortness of breath improved, discharged home with follow-up at outpatient heart failure clinic.  Has been taking Lasix 20 mg daily, states edema is resolved, shortness of breath is much better.  Denies any history of heart attacks.  His pulmonologist due to low contrast chest CT 01/23/2022 showing mild pleural effusions, coronary artery calcifications.  Echocardiogram 03/2022 EF 30 to 35%  Past Medical History:  Diagnosis Date   Anxiety    Arthritis    CHF (congestive heart failure) (HCC)    Depression    Diabetes mellitus without complication (HCC)    GERD (gastroesophageal reflux disease)    Glaucoma    both eyes   Hypertension    Sleep apnea    uses Cpap nightly    Past Surgical History:  Procedure Laterality Date   CARPAL TUNNEL RELEASE Bilateral    COLONOSCOPY     EYE SURGERY Bilateral    cataract surgery with lens implants   KNEE ARTHROPLASTY Right    KNEE ARTHROPLASTY Left    KNEE ARTHROSCOPY Right    KNEE ARTHROSCOPY Left    MULTIPLE TOOTH EXTRACTIONS     ROTATOR CUFF REPAIR Right    ROTATOR CUFF REPAIR W/ DISTAL CLAVICLE EXCISION Left    TOTAL HIP ARTHROPLASTY Right 05/11/2019    Procedure: RIGHT TOTAL HIP ARTHROPLASTY ANTERIOR APPROACH;  Surgeon: Tarry Kos, MD;  Location: [facility] OR;  Service: Orthopedics;  Laterality: Right;   TOTAL HIP ARTHROPLASTY Left 11/23/2019   TOTAL HIP ARTHROPLASTY Left 11/23/2019   Procedure: LEFT TOTAL HIP ARTHROPLASTY ANTERIOR APPROACH;  Surgeon: Tarry Kos, MD;  Location: [facility] OR;  Service: Orthopedics;  Laterality: Left;   VASECTOMY      Current Medications: Current Meds  Medication Sig   aspirin EC 81 MG tablet Take 1 tablet (81 mg total) by mouth 2 (two) times daily.   atorvastatin (LIPITOR) 40 MG tablet Take 20 mg by mouth at bedtime.   BREZTRI AEROSPHERE 160-9-4.8 MCG/ACT AERO Inhale into the lungs.   brimonidine (ALPHAGAN) 0.2 % ophthalmic solution Place 1 drop into both eyes 3 (three) times daily.   Brinzolamide-Brimonidine (SIMBRINZA) 1-0.2 % SUSP    carvedilol (COREG) 12.5 MG tablet Take 1 tablet (12.5 mg total) by mouth 2 (two) times daily.   Cholecalciferol (VITAMIN D) 50 MCG (2000 UT) tablet Take 4,000 Units by mouth daily.    doxazosin (CARDURA) 8 MG tablet Take 4 mg by mouth 2 (two) times daily.    DULoxetine (CYMBALTA) 60 MG capsule Take 60 mg by mouth daily.   furosemide (LASIX) 20 MG tablet Take 1  tablet (20 mg total) by mouth daily. (Patient taking differently: Take 20 mg by mouth daily. 2 tabs daily)   latanoprost (XALATAN) 0.005 % ophthalmic solution Place 1 drop into both eyes at bedtime.   Multiple Vitamins-Minerals (MULTIVITAMIN WITH MINERALS) tablet Take 1 tablet by mouth daily.   Omega-3 1000 MG CAPS Take 2,000 mg by mouth 3 (three) times daily after meals.   omeprazole (PRILOSEC) 40 MG capsule Take 20 mg by mouth 2 (two) times daily.   OXcarbazepine (TRILEPTAL) 150 MG tablet Take 150 mg by mouth 2 (two) times daily.   potassium chloride SA (KLOR-CON M) 20 MEQ tablet Take 1 tablet (20 mEq total) by mouth 3 (three) times daily.   sacubitril-valsartan (ENTRESTO) 49-51 MG Take 1 tablet by mouth 2 (two) times  daily.   Semaglutide,0.25 or 0.5MG /DOS, 2 MG/3ML SOPN Inject 0.25 mg into the skin once a week.   timolol (TIMOPTIC-XR) 0.5 % ophthalmic gel-forming Place 1 drop into both eyes daily.   [DISCONTINUED] amLODipine (NORVASC) 10 MG tablet Take 10 mg by mouth daily.   [DISCONTINUED] metoprolol tartrate (LOPRESSOR) 50 MG tablet Take 50 mg by mouth 2 (two) times daily.      Allergies:   Jardiance [empagliflozin], Methadone, Oxycodone, and Lisinopril   Social History   Socioeconomic History   Marital status: Married    Spouse name: Not on file   Number of children: Not on file   Years of education: Not on file   Highest education level: Not on file  Occupational History   Not on file  Tobacco Use   Smoking status: Former    Packs/day: 0.10    Types: Cigarettes    Quit date: 05/26/2019    Years since quitting: 2.9   Smokeless tobacco: Never   Tobacco comments:    smokes a pack a month  Vaping Use   Vaping Use: Never used  Substance and Sexual Activity   Alcohol use: Not Currently   Drug use: Not Currently   Sexual activity: Not on file  Other Topics Concern   Not on file  Social History Narrative   Not on file   Social Determinants of Health   Financial Resource Strain: Not on file  Food Insecurity: Not on file  Transportation Needs: Not on file  Physical Activity: Not on file  Stress: Not on file  Social Connections: Not on file     Family History: The patient's family history is not on file.  ROS:   Please see the history of present illness.     All other systems reviewed and are negative.  EKGs/Labs/Other Studies Reviewed:    The following studies were reviewed today:   EKG:  EKG is  ordered today.  The ekg ordered today demonstrates sinus rhythm, PVCs.  Recent Labs: 02/05/2022: B Natriuretic Peptide 633.4; Hemoglobin 13.5; Platelets 119 04/04/2022: BUN 13; Creatinine, Ser 1.12; Potassium 3.6; Sodium 140  Recent Lipid Panel No results found for: "CHOL",  "TRIG", "HDL", "CHOLHDL", "VLDL", "LDLCALC", "LDLDIRECT"   Risk Assessment/Calculations:             Physical Exam:    VS:  BP 138/86 (BP Location: Right Arm)   Pulse 81   Ht 6' (1.829 m)   Wt 292 lb 12.8 oz (132.8 kg)   SpO2 95%   BMI 39.71 kg/m     Wt Readings from Last 3 Encounters:  05/01/22 292 lb 12.8 oz (132.8 kg)  04/04/22 286 lb 8 oz (130 kg)  03/09/22 281  lb (127.5 kg)     GEN:  Well nourished, well developed in no acute distress HEENT: Normal NECK: No JVD; No carotid bruits CARDIAC: RRR, no murmurs, rubs, gallops RESPIRATORY: Diminished breath sounds at bases, no wheezing ABDOMEN: Soft, non-tender, non-distended MUSCULOSKELETAL:  No edema; No deformity  SKIN: Warm and dry NEUROLOGIC:  Alert and oriented x 3 PSYCHIATRIC:  Normal affect   ASSESSMENT:    1. HFrEF (heart failure with reduced ejection fraction) ([place])   2. Primary hypertension   3. Pure hypercholesterolemia   4. Coronary artery disease involving native coronary artery of native heart without angina pectoris    PLAN:    In order of problems listed above:  HFrEF, EF 30 to 35%.  Stop Lopressor, stop Norvasc.  Start Coreg 12.5 mg twice daily, continue Entresto 49-51 mg twice daily, continue Lasix 20 mg daily.  Schedule right and left heart cath to evaluate ischemic cause.  Titrate GDMT as BP permits.  Consider adding Aldactone at follow-up visit.  Patient did not tolerate Jardiance in the past, developed yeast infection. Hypertension, Coreg, Entresto as above. Hyperlipidemia, continue Lipitor 40 mg daily.  Obtain fasting lipid profile. CAD/coronary calcifications on chest CT.  Denies chest pain.  Continue aspirin, Lipitor.  Left heart cath as above.  Follow-up after left heart cath      Shared Decision Making/Informed Consent The risks [stroke (1 in 1000), death (1 in 1000), kidney failure [usually temporary] (1 in 500), bleeding (1 in 200), allergic reaction [possibly serious] (1 in 200)],  benefits (diagnostic support and management of coronary artery disease) and alternatives of a cardiac catheterization were discussed in detail with Mr. Tay and he is willing to proceed.    Medication Adjustments/Labs and Tests Ordered: Current medicines are reviewed at length with the patient today.  Concerns regarding medicines are outlined above.  Orders Placed This Encounter  Procedures   CBC   Basic metabolic panel   Lipid panel   EKG 12-Lead   Meds ordered this encounter  Medications   carvedilol (COREG) 12.5 MG tablet    Sig: Take 1 tablet (12.5 mg total) by mouth 2 (two) times daily.    Dispense:  60 tablet    Refill:  5    Patient Instructions  Medication Instructions:   Your physician has recommended you make the following change in your medication:    STOP taking your Metoprolol Tartrate.  2.    STOP taking your Amlodipine.  3.    START taking Carvedilol (Coreg) 12.5 MG Twice a day.   *If you need a refill on your cardiac medications before your next appointment, please call your pharmacy*   Lab Work:  Your physician recommends that you return for a BMP, CBC, and FASTING lipid profile:   TOMORROW MORNING  - You will need to be fasting. Please do not have anything to eat or drink after midnight the morning you have the lab work. You may only have water or black coffee with no cream or sugar.   - Please go to the [facility]. You will check in at the front desk to the right as you walk into the atrium. Valet Parking is offered if needed. - No appointment needed. You may go any day between 7 am and 6 pm.    Testing/Procedures:   You are scheduled for a Cardiac Catheterization on Thursday, November 2 with Dr. Glenetta Hew.  1. Please arrive at the [facility] at 12:30 PM (This time is  one hour before your procedure to ensure your preparation). Free valet parking service is available.   Special note: Every effort is made to have your procedure  done on time. Please understand that emergencies sometimes delay scheduled procedures.  2. Diet: Do not eat solid foods after midnight.  The patient may have clear liquids until 5am upon the day of the procedure.  3. Labs: You will need to have blood drawn on , November 1 at [facility], Go to 1st desk on your right to register.  Address: [address] [place], [place] [address]  Open: 8am - 5pm  Phone: [phone]. You do not need to be fasting.  4. Medication instructions in preparation for your procedure:   Contrast Allergy: No   Stop taking, Lasix (Furosemide)  Thursday, November 2,   On the morning of your procedure, take your Aspirin 81 mg and any morning medicines NOT listed above.  You may use sips of water.  5. Plan for one night stay--bring personal belongings. 6. Bring a current list of your medications and current insurance cards. 7. You MUST have a responsible person to drive you home. 8. Someone MUST be with you the first 24 hours after you arrive home or your discharge will be delayed. 9. Please wear clothes that are easy to get on and off and wear slip-on shoes.  Thank you for allowing [place] to care for you!   -- [facility]]   Follow-Up: At [facility], you and your health needs are our priority.  As part of our continuing mission to provide you with exceptional heart care, we have created designated Provider Care Teams.  These Care Teams include your primary Cardiologist (physician) and Advanced Practice Providers (APPs -  Physician Assistants and Nurse Practitioners) who all work together to provide you with the care you need, when you need it.  We recommend signing up for the patient portal called "MyChart".  Sign up information is provided on this After Visit Summary.  MyChart is used to connect with patients for Virtual Visits (Telemedicine).  Patients are able to view lab/test results, encounter notes,  upcoming appointments, etc.  Non-urgent messages can be sent to your provider as well.   To learn more about what you can do with MyChart, go to [email or web address].    Your next appointment:   4-6 week(s)  The format for your next appointment:   In Person  Provider:    ONLY WITH Debbe Odea, MD    Other Instructions    Important Information About Sugar         Signed, Debbe Odea, MD  05/01/2022 10:50 AM    [facility]

## 2022-05-02 ENCOUNTER — Other Ambulatory Visit
Admission: RE | Admit: 2022-05-02 | Discharge: 2022-05-02 | Disposition: A | Discharge status: Home / Self Care | Payer: Medicare HMO | Source: Home / Self Care | Attending: Cardiology | Admitting: Cardiology

## 2022-05-02 DIAGNOSIS — I5020 Unspecified systolic (congestive) heart failure: Secondary | ICD-10-CM | POA: Insufficient documentation

## 2022-05-02 DIAGNOSIS — E7800 Pure hypercholesterolemia, unspecified: Secondary | ICD-10-CM | POA: Insufficient documentation

## 2022-05-02 LAB — BASIC METABOLIC PANEL
Anion gap: 8 (ref 5–15)
BUN: 11 mg/dL (ref 8–23)
CO2: 29 mmol/L (ref 22–32)
Calcium: 9.2 mg/dL (ref 8.9–10.3)
Chloride: 104 mmol/L (ref 98–111)
Creatinine, Ser: 1.08 mg/dL (ref 0.61–1.24)
GFR, Estimated: >60 mL/min (ref >60)
Glucose, Bld: 171 mg/dL — ABNORMAL HIGH (ref 70–99)
Potassium: 3.3 mmol/L — ABNORMAL LOW (ref 3.5–5.1)
Sodium: 141 mmol/L (ref 135–145)

## 2022-05-02 LAB — LIPID PANEL
Cholesterol: 147 mg/dL (ref 0–200)
HDL: 46 mg/dL (ref >40)
LDL Cholesterol: 91 mg/dL (ref 0–99)
Total CHOL/HDL Ratio: 3.2 RATIO
Triglycerides: 52 mg/dL (ref <150)
VLDL: 10 mg/dL (ref 0–40)

## 2022-05-03 ENCOUNTER — Encounter
Admission: AD | Disposition: A | Discharge status: Home / Self Care | Payer: No Typology Code available for payment source | Source: Home / Self Care | Attending: Internal Medicine

## 2022-05-03 ENCOUNTER — Inpatient Hospital Stay
Admission: AD | Admit: 2022-05-03 | Discharge: 2022-05-07 | DRG: 286 | Disposition: A | Discharge status: Home Health | Payer: Medicare HMO | Attending: Internal Medicine | Admitting: Internal Medicine

## 2022-05-03 ENCOUNTER — Encounter: Payer: Self-pay | Admitting: Cardiology

## 2022-05-03 ENCOUNTER — Other Ambulatory Visit: Payer: Self-pay

## 2022-05-03 DIAGNOSIS — Z888 Allergy status to other drugs, medicaments and biological substances status: Secondary | ICD-10-CM

## 2022-05-03 DIAGNOSIS — Z96643 Presence of artificial hip joint, bilateral: Secondary | ICD-10-CM | POA: Diagnosis present

## 2022-05-03 DIAGNOSIS — Z7984 Long term (current) use of oral hypoglycemic drugs: Secondary | ICD-10-CM

## 2022-05-03 DIAGNOSIS — E785 Hyperlipidemia, unspecified: Secondary | ICD-10-CM | POA: Diagnosis present

## 2022-05-03 DIAGNOSIS — I110 Hypertensive heart disease with heart failure: Principal | ICD-10-CM | POA: Diagnosis present

## 2022-05-03 DIAGNOSIS — I272 Pulmonary hypertension, unspecified: Secondary | ICD-10-CM | POA: Diagnosis present

## 2022-05-03 DIAGNOSIS — G4730 Sleep apnea, unspecified: Secondary | ICD-10-CM | POA: Diagnosis present

## 2022-05-03 DIAGNOSIS — Z833 Family history of diabetes mellitus: Secondary | ICD-10-CM

## 2022-05-03 DIAGNOSIS — Z96653 Presence of artificial knee joint, bilateral: Secondary | ICD-10-CM | POA: Diagnosis present

## 2022-05-03 DIAGNOSIS — I10 Essential (primary) hypertension: Secondary | ICD-10-CM | POA: Diagnosis present

## 2022-05-03 DIAGNOSIS — Z87891 Personal history of nicotine dependence: Secondary | ICD-10-CM | POA: Diagnosis not present

## 2022-05-03 DIAGNOSIS — F418 Other specified anxiety disorders: Secondary | ICD-10-CM | POA: Diagnosis not present

## 2022-05-03 DIAGNOSIS — R931 Abnormal findings on diagnostic imaging of heart and coronary circulation: Secondary | ICD-10-CM

## 2022-05-03 DIAGNOSIS — G4733 Obstructive sleep apnea (adult) (pediatric): Secondary | ICD-10-CM | POA: Diagnosis present

## 2022-05-03 DIAGNOSIS — K219 Gastro-esophageal reflux disease without esophagitis: Secondary | ICD-10-CM | POA: Diagnosis present

## 2022-05-03 DIAGNOSIS — E7800 Pure hypercholesterolemia, unspecified: Secondary | ICD-10-CM | POA: Diagnosis present

## 2022-05-03 DIAGNOSIS — Z79899 Other long term (current) drug therapy: Secondary | ICD-10-CM

## 2022-05-03 DIAGNOSIS — I493 Ventricular premature depolarization: Secondary | ICD-10-CM | POA: Diagnosis not present

## 2022-05-03 DIAGNOSIS — I5043 Acute on chronic combined systolic (congestive) and diastolic (congestive) heart failure: Principal | ICD-10-CM | POA: Diagnosis present

## 2022-05-03 DIAGNOSIS — F32A Depression, unspecified: Secondary | ICD-10-CM | POA: Diagnosis present

## 2022-05-03 DIAGNOSIS — E876 Hypokalemia: Secondary | ICD-10-CM | POA: Diagnosis present

## 2022-05-03 DIAGNOSIS — Z8249 Family history of ischemic heart disease and other diseases of the circulatory system: Secondary | ICD-10-CM | POA: Diagnosis not present

## 2022-05-03 DIAGNOSIS — Z20822 Contact with and (suspected) exposure to covid-19: Secondary | ICD-10-CM | POA: Diagnosis present

## 2022-05-03 DIAGNOSIS — I428 Other cardiomyopathies: Secondary | ICD-10-CM | POA: Diagnosis present

## 2022-05-03 DIAGNOSIS — Z7189 Other specified counseling: Secondary | ICD-10-CM

## 2022-05-03 DIAGNOSIS — Z6839 Body mass index (BMI) 39.0-39.9, adult: Secondary | ICD-10-CM | POA: Diagnosis not present

## 2022-05-03 DIAGNOSIS — Z7982 Long term (current) use of aspirin: Secondary | ICD-10-CM

## 2022-05-03 DIAGNOSIS — Z885 Allergy status to narcotic agent status: Secondary | ICD-10-CM

## 2022-05-03 DIAGNOSIS — E119 Type 2 diabetes mellitus without complications: Secondary | ICD-10-CM | POA: Diagnosis present

## 2022-05-03 DIAGNOSIS — I2510 Atherosclerotic heart disease of native coronary artery without angina pectoris: Secondary | ICD-10-CM | POA: Diagnosis present

## 2022-05-03 DIAGNOSIS — Z9852 Vasectomy status: Secondary | ICD-10-CM | POA: Diagnosis not present

## 2022-05-03 DIAGNOSIS — I5020 Unspecified systolic (congestive) heart failure: Secondary | ICD-10-CM

## 2022-05-03 DIAGNOSIS — R002 Palpitations: Secondary | ICD-10-CM | POA: Diagnosis not present

## 2022-05-03 DIAGNOSIS — H409 Unspecified glaucoma: Secondary | ICD-10-CM | POA: Diagnosis present

## 2022-05-03 DIAGNOSIS — F419 Anxiety disorder, unspecified: Secondary | ICD-10-CM | POA: Diagnosis present

## 2022-05-03 DIAGNOSIS — E1129 Type 2 diabetes mellitus with other diabetic kidney complication: Secondary | ICD-10-CM

## 2022-05-03 HISTORY — PX: RIGHT/LEFT HEART CATH AND CORONARY ANGIOGRAPHY: CATH118266

## 2022-05-03 HISTORY — DX: Hypokalemia: E87.6

## 2022-05-03 HISTORY — DX: Acute on chronic combined systolic (congestive) and diastolic (congestive) heart failure: I50.43

## 2022-05-03 LAB — CBC
HCT: 45.8 % (ref 39.0–52.0)
Hemoglobin: 15 g/dL (ref 13.0–17.0)
MCH: 28 pg (ref 26.0–34.0)
MCHC: 32.8 g/dL (ref 30.0–36.0)
MCV: 85.4 fL (ref 80.0–100.0)
Platelets: 122 10*3/uL — ABNORMAL LOW (ref 150–400)
RBC: 5.36 MIL/uL (ref 4.22–5.81)
RDW: 13.2 % (ref 11.5–15.5)
WBC: 6.2 10*3/uL (ref 4.0–10.5)
nRBC: 0 % (ref 0.0–0.2)

## 2022-05-03 LAB — GLUCOSE, CAPILLARY
Glucose-Capillary: 137 mg/dL — ABNORMAL HIGH (ref 70–99)
Glucose-Capillary: 147 mg/dL — ABNORMAL HIGH (ref 70–99)
Glucose-Capillary: 195 mg/dL — ABNORMAL HIGH (ref 70–99)

## 2022-05-03 LAB — BRAIN NATRIURETIC PEPTIDE: B Natriuretic Peptide: 869.5 pg/mL — ABNORMAL HIGH (ref 0.0–100.0)

## 2022-05-03 LAB — POCT I-STAT 7, (LYTES, BLD GAS, ICA,H+H)
Acid-Base Excess: 3 mmol/L — ABNORMAL HIGH (ref 0.0–2.0)
Bicarbonate: 28.1 mmol/L — ABNORMAL HIGH (ref 20.0–28.0)
Calcium, Ion: 1.23 mmol/L (ref 1.15–1.40)
HCT: 44 % (ref 39.0–52.0)
Hemoglobin: 15 g/dL (ref 13.0–17.0)
O2 Saturation: 95 %
Potassium: 3.1 mmol/L — ABNORMAL LOW (ref 3.5–5.1)
Sodium: 141 mmol/L (ref 135–145)
TCO2: 29 mmol/L (ref 22–32)
pCO2 arterial: 42.4 mmHg (ref 32–48)
pH, Arterial: 7.43 (ref 7.35–7.45)
pO2, Arterial: 76 mmHg — ABNORMAL LOW (ref 83–108)

## 2022-05-03 SURGERY — RIGHT/LEFT HEART CATH AND CORONARY ANGIOGRAPHY
Anesthesia: Moderate Sedation | Laterality: Bilateral

## 2022-05-03 MED ORDER — MIDAZOLAM HCL 2 MG/2ML IJ SOLN
INTRAMUSCULAR | Status: AC
  Filled 2022-05-03: qty 2

## 2022-05-03 MED ORDER — INSULIN ASPART 100 UNIT/ML IJ SOLN
0.0000 [IU] | Freq: Three times a day (TID) | INTRAMUSCULAR | Status: DC
  Administered 2022-05-04 – 2022-05-05 (×2): 2 [IU] via SUBCUTANEOUS
  Administered 2022-05-05: 1 [IU] via SUBCUTANEOUS
  Administered 2022-05-05 – 2022-05-06 (×2): 2 [IU] via SUBCUTANEOUS
  Administered 2022-05-06 – 2022-05-07 (×3): 1 [IU] via SUBCUTANEOUS
  Filled 2022-05-03 (×8): qty 1

## 2022-05-03 MED ORDER — POTASSIUM CHLORIDE CRYS ER 20 MEQ PO TBCR
40.0000 meq | EXTENDED_RELEASE_TABLET | ORAL | Status: AC
  Administered 2022-05-03 (×2): 40 meq via ORAL
  Filled 2022-05-03: qty 2

## 2022-05-03 MED ORDER — ATORVASTATIN CALCIUM 20 MG PO TABS
20.0000 mg | ORAL_TABLET | Freq: Every day | ORAL | Status: DC
  Administered 2022-05-03: 20 mg via ORAL
  Filled 2022-05-03 (×2): qty 1

## 2022-05-03 MED ORDER — ENOXAPARIN SODIUM 40 MG/0.4ML IJ SOSY: 40.0000 mg | PREFILLED_SYRINGE | INTRAMUSCULAR | Status: DC

## 2022-05-03 MED ORDER — UMECLIDINIUM BROMIDE 62.5 MCG/ACT IN AEPB
1.0000 | INHALATION_SPRAY | Freq: Every day | RESPIRATORY_TRACT | Status: DC
  Administered 2022-05-04 – 2022-05-07 (×4): 1 via RESPIRATORY_TRACT
  Filled 2022-05-03: qty 7

## 2022-05-03 MED ORDER — DM-GUAIFENESIN ER 30-600 MG PO TB12: 1.0000 | ORAL_TABLET | Freq: Two times a day (BID) | ORAL | Status: DC | PRN

## 2022-05-03 MED ORDER — SODIUM CHLORIDE 0.9% FLUSH
3.0000 mL | Freq: Two times a day (BID) | INTRAVENOUS | Status: DC
  Administered 2022-05-04 – 2022-05-07 (×6): 3 mL via INTRAVENOUS

## 2022-05-03 MED ORDER — ALBUTEROL SULFATE (2.5 MG/3ML) 0.083% IN NEBU: 3.0000 mL | INHALATION_SOLUTION | RESPIRATORY_TRACT | Status: DC | PRN

## 2022-05-03 MED ORDER — HEPARIN SODIUM (PORCINE) 1000 UNIT/ML IJ SOLN
INTRAMUSCULAR | Status: AC
  Filled 2022-05-03: qty 10

## 2022-05-03 MED ORDER — LABETALOL HCL 5 MG/ML IV SOLN
INTRAVENOUS | Status: DC | PRN
  Administered 2022-05-03: 10 mg via INTRAVENOUS

## 2022-05-03 MED ORDER — POTASSIUM CHLORIDE CRYS ER 20 MEQ PO TBCR
EXTENDED_RELEASE_TABLET | ORAL | Status: AC
  Filled 2022-05-03: qty 2

## 2022-05-03 MED ORDER — FENTANYL CITRATE (PF) 100 MCG/2ML IJ SOLN
INTRAMUSCULAR | Status: DC | PRN
  Administered 2022-05-03: 25 ug via INTRAVENOUS

## 2022-05-03 MED ORDER — ONDANSETRON HCL 4 MG/2ML IJ SOLN: 4.0000 mg | Freq: Three times a day (TID) | INTRAMUSCULAR | Status: DC | PRN

## 2022-05-03 MED ORDER — POTASSIUM CHLORIDE CRYS ER 20 MEQ PO TBCR: 20.0000 meq | EXTENDED_RELEASE_TABLET | Freq: Three times a day (TID) | ORAL | Status: DC

## 2022-05-03 MED ORDER — BRIMONIDINE TARTRATE 0.2 % OP SOLN
1.0000 [drp] | Freq: Three times a day (TID) | OPHTHALMIC | Status: DC
  Filled 2022-05-03: qty 5

## 2022-05-03 MED ORDER — TIMOLOL MALEATE 0.5 % OP SOLN
1.0000 [drp] | Freq: Every day | OPHTHALMIC | Status: DC
  Filled 2022-05-03: qty 5

## 2022-05-03 MED ORDER — SODIUM CHLORIDE 0.9 % IV SOLN: 250.0000 mL | INTRAVENOUS | Status: DC | PRN

## 2022-05-03 MED ORDER — SODIUM CHLORIDE 0.9 % IV SOLN: INTRAVENOUS | Status: DC

## 2022-05-03 MED ORDER — HEPARIN (PORCINE) IN NACL 1000-0.9 UT/500ML-% IV SOLN
INTRAVENOUS | Status: DC | PRN
  Administered 2022-05-03: 1000 mL

## 2022-05-03 MED ORDER — LABETALOL HCL 5 MG/ML IV SOLN: 10.0000 mg | INTRAVENOUS | Status: AC | PRN

## 2022-05-03 MED ORDER — HYDRALAZINE HCL 20 MG/ML IJ SOLN
INTRAMUSCULAR | Status: AC
  Filled 2022-05-03: qty 1

## 2022-05-03 MED ORDER — FENTANYL CITRATE (PF) 100 MCG/2ML IJ SOLN
INTRAMUSCULAR | Status: AC
  Filled 2022-05-03: qty 2

## 2022-05-03 MED ORDER — SODIUM CHLORIDE 0.9% FLUSH: 3.0000 mL | INTRAVENOUS | Status: DC | PRN

## 2022-05-03 MED ORDER — HYDRALAZINE HCL 20 MG/ML IJ SOLN
5.0000 mg | INTRAMUSCULAR | Status: DC | PRN
  Administered 2022-05-03: 5 mg via INTRAVENOUS

## 2022-05-03 MED ORDER — LIDOCAINE HCL 1 % IJ SOLN
INTRAMUSCULAR | Status: AC
  Filled 2022-05-03: qty 20

## 2022-05-03 MED ORDER — SODIUM CHLORIDE 0.9% FLUSH: 3.0000 mL | Freq: Two times a day (BID) | INTRAVENOUS | Status: DC

## 2022-05-03 MED ORDER — SACUBITRIL-VALSARTAN 49-51 MG PO TABS
1.0000 | ORAL_TABLET | Freq: Two times a day (BID) | ORAL | Status: DC
  Administered 2022-05-03 – 2022-05-04 (×2): 1 via ORAL
  Filled 2022-05-03 (×2): qty 1

## 2022-05-03 MED ORDER — IOHEXOL 350 MG/ML SOLN
INTRAVENOUS | Status: DC | PRN
  Administered 2022-05-03: 58 mL

## 2022-05-03 MED ORDER — HEPARIN (PORCINE) IN NACL 1000-0.9 UT/500ML-% IV SOLN
INTRAVENOUS | Status: AC
  Filled 2022-05-03: qty 1000

## 2022-05-03 MED ORDER — LATANOPROST 0.005 % OP SOLN
1.0000 [drp] | Freq: Every day | OPHTHALMIC | Status: DC
  Administered 2022-05-04 – 2022-05-06 (×3): 1 [drp] via OPHTHALMIC
  Filled 2022-05-03: qty 2.5

## 2022-05-03 MED ORDER — ACETAMINOPHEN 325 MG PO TABS: 650.0000 mg | ORAL_TABLET | Freq: Four times a day (QID) | ORAL | Status: DC | PRN

## 2022-05-03 MED ORDER — MOMETASONE FURO-FORMOTEROL FUM 100-5 MCG/ACT IN AERO
2.0000 | INHALATION_SPRAY | Freq: Two times a day (BID) | RESPIRATORY_TRACT | Status: DC
  Administered 2022-05-04 – 2022-05-07 (×7): 2 via RESPIRATORY_TRACT
  Filled 2022-05-03: qty 8.8

## 2022-05-03 MED ORDER — VERAPAMIL HCL 2.5 MG/ML IV SOLN
INTRAVENOUS | Status: DC | PRN
  Administered 2022-05-03: 2.5 mg via INTRA_ARTERIAL

## 2022-05-03 MED ORDER — PANTOPRAZOLE SODIUM 40 MG PO TBEC
40.0000 mg | DELAYED_RELEASE_TABLET | Freq: Every day | ORAL | Status: DC
  Administered 2022-05-04 – 2022-05-07 (×4): 40 mg via ORAL
  Filled 2022-05-03 (×5): qty 1

## 2022-05-03 MED ORDER — LIDOCAINE HCL (PF) 1 % IJ SOLN
INTRAMUSCULAR | Status: DC | PRN
  Administered 2022-05-03: 20 mL
  Administered 2022-05-03 (×2): 5 mL

## 2022-05-03 MED ORDER — OXCARBAZEPINE 150 MG PO TABS
150.0000 mg | ORAL_TABLET | Freq: Two times a day (BID) | ORAL | Status: DC
  Administered 2022-05-03 – 2022-05-07 (×8): 150 mg via ORAL
  Filled 2022-05-03 (×9): qty 1

## 2022-05-03 MED ORDER — ENOXAPARIN SODIUM 80 MG/0.8ML IJ SOSY
0.5000 mg/kg | PREFILLED_SYRINGE | INTRAMUSCULAR | Status: DC
  Administered 2022-05-04 – 2022-05-06 (×3): 65 mg via SUBCUTANEOUS
  Filled 2022-05-03: qty 0.8
  Filled 2022-05-03: qty 0.65
  Filled 2022-05-03 (×2): qty 0.8

## 2022-05-03 MED ORDER — MIDAZOLAM HCL 2 MG/2ML IJ SOLN
INTRAMUSCULAR | Status: DC | PRN
  Administered 2022-05-03: 1 mg via INTRAVENOUS

## 2022-05-03 MED ORDER — ASPIRIN 81 MG PO TBEC
81.0000 mg | DELAYED_RELEASE_TABLET | Freq: Every day | ORAL | Status: DC
  Administered 2022-05-04 – 2022-05-07 (×4): 81 mg via ORAL
  Filled 2022-05-03 (×4): qty 1

## 2022-05-03 MED ORDER — FUROSEMIDE 10 MG/ML IJ SOLN
80.0000 mg | Freq: Two times a day (BID) | INTRAMUSCULAR | Status: DC
  Administered 2022-05-03 – 2022-05-04 (×3): 80 mg via INTRAVENOUS
  Filled 2022-05-03 (×3): qty 8

## 2022-05-03 MED ORDER — VERAPAMIL HCL 2.5 MG/ML IV SOLN
INTRAVENOUS | Status: AC
  Filled 2022-05-03: qty 2

## 2022-05-03 MED ORDER — DULOXETINE HCL 30 MG PO CPEP
60.0000 mg | ORAL_CAPSULE | Freq: Every day | ORAL | Status: DC
  Administered 2022-05-04 – 2022-05-07 (×4): 60 mg via ORAL
  Filled 2022-05-03: qty 2
  Filled 2022-05-03: qty 1
  Filled 2022-05-03 (×3): qty 2

## 2022-05-03 MED ORDER — FUROSEMIDE 10 MG/ML IJ SOLN
INTRAMUSCULAR | Status: AC
  Filled 2022-05-03: qty 4

## 2022-05-03 MED ORDER — LABETALOL HCL 5 MG/ML IV SOLN
INTRAVENOUS | Status: AC
  Filled 2022-05-03: qty 4

## 2022-05-03 MED ORDER — BRIMONIDINE TARTRATE 0.2 % OP SOLN
1.0000 [drp] | Freq: Three times a day (TID) | OPHTHALMIC | Status: DC
  Administered 2022-05-04 – 2022-05-07 (×10): 1 [drp] via OPHTHALMIC
  Filled 2022-05-03: qty 5

## 2022-05-03 MED ORDER — CARVEDILOL 12.5 MG PO TABS
12.5000 mg | ORAL_TABLET | Freq: Two times a day (BID) | ORAL | Status: DC
  Administered 2022-05-03 – 2022-05-04 (×3): 12.5 mg via ORAL
  Filled 2022-05-03 (×4): qty 1

## 2022-05-03 MED ORDER — FUROSEMIDE 10 MG/ML IJ SOLN
INTRAMUSCULAR | Status: DC | PRN
  Administered 2022-05-03: 40 mg via INTRAVENOUS

## 2022-05-03 MED ORDER — BRINZOLAMIDE 1 % OP SUSP
1.0000 [drp] | Freq: Three times a day (TID) | OPHTHALMIC | Status: DC
  Administered 2022-05-04 – 2022-05-07 (×10): 1 [drp] via OPHTHALMIC
  Filled 2022-05-03: qty 10

## 2022-05-03 MED ORDER — INSULIN ASPART 100 UNIT/ML IJ SOLN: 0.0000 [IU] | Freq: Every day | INTRAMUSCULAR | Status: DC

## 2022-05-03 MED ORDER — BUDESON-GLYCOPYRROL-FORMOTEROL 160-9-4.8 MCG/ACT IN AERO: 1.0000 | INHALATION_SPRAY | Freq: Every day | RESPIRATORY_TRACT | Status: DC

## 2022-05-03 SURGICAL SUPPLY — 20 items
BAND CMPR LRG ZPHR (HEMOSTASIS) ×1
BAND ZEPHYR COMPRESS 30 LONG (HEMOSTASIS) IMPLANT
CANNULA 5F STIFF (CANNULA) IMPLANT
CATH 5F 110X4 TIG (CATHETERS) IMPLANT
CATH BALLN WEDGE 5F 110CM (CATHETERS) IMPLANT
CATH INFINITI 5FR MULTPACK ANG (CATHETERS) IMPLANT
DEVICE CLOSURE MYNXGRIP 5F (Vascular Products) IMPLANT
DRAPE BRACHIAL (DRAPES) IMPLANT
GLIDESHEATH SLEND SS 6F .021 (SHEATH) IMPLANT
GUIDEWIRE INQWIRE 1.5J.035X260 (WIRE) IMPLANT
INQWIRE 1.5J .035X260CM (WIRE) ×1
PACK CARDIAC CATH (CUSTOM PROCEDURE TRAY) ×1 IMPLANT
PANNUS RETENTION SYSTEM 2 PAD (MISCELLANEOUS) IMPLANT
PROTECTION STATION PRESSURIZED (MISCELLANEOUS) ×1
SET ATX SIMPLICITY (MISCELLANEOUS) IMPLANT
SHEATH AVANTI 5FR X 11CM (SHEATH) IMPLANT
SHEATH GLIDE SLENDER 4/5FR (SHEATH) IMPLANT
STATION PROTECTION PRESSURIZED (MISCELLANEOUS) IMPLANT
WIRE GUIDERIGHT .035X150 (WIRE) IMPLANT
WIRE HITORQ VERSACORE ST 145CM (WIRE) IMPLANT

## 2022-05-03 NOTE — H&P (Signed)
History and Physical    Erik Black OIZ:[id number] DOB: [birth year] DOA: 05/03/2022  Referring MD/NP/PA:   PCP: [facility]   Patient coming from:  The patient is coming from home.         Chief Complaint: SOB  HPI: Erik Black is a 71 y.o. male with medical history significant of sCHF with EF 30-35%, HTN, HLD, DM, depression with anxiety, OSA on CPAP, CAD, GERD, who presents with shortness of breath.  Patient has history of systolic CHF with EF of 30 to 35%.  Patient is scheduled for cardiac cath today for further evaluation of his cardiomyopathy per Dr. Ellyn Hack of cardiology.  Cardiac catheter showed EF of 30 to 35% /Cardiac Index of 2.25 and PCWP 38 mmHg, PA mean 41 mmHg, and LVEDP of 40 mmHg.  Patient states that he has shortness of breath chronically, which has worsened recently.  Patient does not have chest pain, cough, fever or chills.  Patient denies nausea, vomiting, diarrhea or abdominal pain.  No symptoms of UTI.  Patient has trace leg edema.   Cardiac cath by Dr. Ellyn Hack today: Mid RCA to Dist RCA lesion is 30% stenosed.   Ost Cx to Prox Cx lesion is 55% stenosed.   Dist LAD lesion is 60% stenosed.   LV end diastolic pressure is severely elevated.   Hemodynamic findings consistent with moderate pulmonary hypertension.   There is no aortic valve stenosis.   Nonischemic Cardiomyopathy: Angiographically Moderate 3-vessel disease Acute on Chronic severe Combined Systolic and Diastolic Heart Failure-known EF of 30 to 35% /Cardiac Index of 2.25.  PCWP 38 mmHg, PA mean 41 mmHg, and LVEDP of 40 mmHg. Right Heart Cath Numbers: RAP mean 18 mmHg, RV-EDP 68/11-24 mmHg; PAP 69/39 mmHg-mean 41 mmHg; PCWP 38 to 41 mmHg. Ao sat 95%, PA sat 66%. Cardiac Output 5.59--Index 2.25        PLAN With severe acute on chronic combined systolic and diastolic failure, discussed with the patient and Dr. Garen Lah, the plan will be to admit to the hospital service for  management of heart failure.  He has been given 40 mg of Lasix in the Cath Lab and will give another 80 g tonight.  Need to closely follow his potassium levels.   Per Dr. Garen Lah, plan for medication management as follows: Acute combined CHF:  Converted from Lopressor to carvedilol 12.5 mg twice daily Continue Entresto 49 and 51 mg daily and titrate up as tolerated We will need to determine appropriate dose of outpatient Lasix, but for now we will do 80 mg IV twice daily-depending on urine output may or may not need to consider inotropic support  If pressure tolerates, would add spironolactone Patient has had a a severe reaction to Jardiance in the past with yeast infection and skin peeling.  Therefore no SGLT2 inhibitor, but consider GLP-1 agonist.  Top Lopressor, stop Norvasc.  Start Coreg 12.5 mg twice daily, continue Entresto 49-51 mg twice daily, continue Lasix 20 mg daily.  Schedule right and left heart cath to evaluate ischemic cause.  Titrate GDMT as BP permits.  Consider adding Aldactone at follow-up visit.  Patient did not tolerate Jardiance in the past, developed yeast infection.   Hyperlipidemia, continue Lipitor 40 mg daily.  Obtain fasting lipid profile. CAD/coronary calcifications on chest CT. patient has no chest pain.  Angiographically normal coronary arteries.  Continue aspirin & Lipitor.   Data reviewed independently and ED Course: pt was found to have BNP 869, potassium 3.1, renal function okay,  WBC 6.2, no fever, blood pressure 147/102, heart rate 72, RR 16, oxygen saturation 94% on room air.  EKG:  Not done in ED, will get one.      Review of Systems:   General: no fevers, chills, no body weight gain, has fatigue HEENT: no blurry vision, hearing changes or sore throat Respiratory: has dyspnea, no coughing, wheezing CV: no chest pain, no palpitations GI: no nausea, vomiting, abdominal pain, diarrhea, constipation GU: no dysuria, burning on urination, increased  urinary frequency, hematuria  Ext: has trace leg edema Neuro: no unilateral weakness, numbness, or tingling, no vision change or hearing loss Skin: no rash, no skin tear. MSK: No muscle spasm, no deformity, no limitation of range of movement in spin Heme: No easy bruising.  Travel history: No recent long distant travel.   Allergy:  Allergies  Allergen Reactions   Jardiance [Empagliflozin] Other (See Comments)    Yeast infection   Methadone    Oxycodone Itching    Hands started peeling and "could not swallow"   Lisinopril Cough    Past Medical History:  Diagnosis Date   Anxiety    Arthritis    CHF (congestive heart failure) ([place])    Depression    Diabetes mellitus without complication (HCC)    GERD (gastroesophageal reflux disease)    Glaucoma    both eyes   Hypertension    Sleep apnea    uses Cpap nightly    Past Surgical History:  Procedure Laterality Date   CARPAL TUNNEL RELEASE Bilateral    COLONOSCOPY     EYE SURGERY Bilateral    cataract surgery with lens implants   KNEE ARTHROPLASTY Right    KNEE ARTHROPLASTY Left    KNEE ARTHROSCOPY Right    KNEE ARTHROSCOPY Left    MULTIPLE TOOTH EXTRACTIONS     ROTATOR CUFF REPAIR Right    ROTATOR CUFF REPAIR W/ DISTAL CLAVICLE EXCISION Left    TOTAL HIP ARTHROPLASTY Right 05/11/2019   Procedure: RIGHT TOTAL HIP ARTHROPLASTY ANTERIOR APPROACH;  Surgeon: Leandrew Koyanagi, MD;  Location: [facility];  Service: Orthopedics;  Laterality: Right;   TOTAL HIP ARTHROPLASTY Left 11/23/2019   TOTAL HIP ARTHROPLASTY Left 11/23/2019   Procedure: LEFT TOTAL HIP ARTHROPLASTY ANTERIOR APPROACH;  Surgeon: Leandrew Koyanagi, MD;  Location: [facility];  Service: Orthopedics;  Laterality: Left;   VASECTOMY      Social History:  reports that he quit smoking about 2 years ago. His smoking use included cigarettes. He smoked an average of .1 packs per day. He has never used smokeless tobacco. He reports that he does not currently use alcohol. He reports that he  does not currently use drugs.  Family History:  Family History  Problem Relation Age of Onset   Hypertension Mother    Diabetes Mother    Heart disease Mother    Heart disease Father    Heart disease Brother      Prior to Admission medications   Medication Sig Start Date End Date Taking? Authorizing Provider  aspirin EC 81 MG tablet Take 1 tablet (81 mg total) by mouth 2 (two) times daily. 05/11/19  Yes Leandrew Koyanagi, MD  atorvastatin (LIPITOR) 40 MG tablet Take 20 mg by mouth at bedtime.   Yes [provider]  BREZTRI AEROSPHERE 160-9-4.8 MCG/ACT AERO Inhale into the lungs. 04/17/22  Yes [provider]  brimonidine (ALPHAGAN) 0.2 % ophthalmic solution Place 1 drop into both eyes 3 (three) times daily.   Yes  [provider]  carvedilol (COREG) 12.5 MG tablet Take 1 tablet (12.5 mg total) by mouth 2 (two) times daily. 05/01/22 12/29/22 Yes Agbor-Etang, Aaron Edelman, MD  Cholecalciferol (VITAMIN D) 50 MCG (2000 UT) tablet Take 4,000 Units by mouth daily.  03/03/19  Yes [provider]  doxazosin (CARDURA) 8 MG tablet Take 4 mg by mouth 2 (two) times daily.  01/16/19  Yes [provider]  DULoxetine (CYMBALTA) 60 MG capsule Take 60 mg by mouth daily. 01/07/19  Yes [provider]  furosemide (LASIX) 20 MG tablet Take 1 tablet (20 mg total) by mouth daily. Patient taking differently: Take 20 mg by mouth daily. 2 tabs daily 02/12/22  Yes Hackney, Tina A, FNP  latanoprost (XALATAN) 0.005 % ophthalmic solution Place 1 drop into both eyes at bedtime.   Yes [provider]  Multiple Vitamins-Minerals (MULTIVITAMIN WITH MINERALS) tablet Take 1 tablet by mouth daily.   Yes [provider]  Omega-3 1000 MG CAPS Take 2,000 mg by mouth 3 (three) times daily after meals.   Yes [provider]  omeprazole (PRILOSEC) 40 MG capsule Take 20 mg by mouth 2 (two) times daily.   Yes [provider]  OXcarbazepine (TRILEPTAL) 150 MG  tablet Take 150 mg by mouth 2 (two) times daily.   Yes [provider]  potassium chloride SA (KLOR-CON M) 20 MEQ tablet Take 1 tablet (20 mEq total) by mouth 3 (three) times daily. 03/09/22  Yes Hackney, Tina A, FNP  sacubitril-valsartan (ENTRESTO) 49-51 MG Take 1 tablet by mouth 2 (two) times daily. 03/09/22  Yes Hackney, Tina A, FNP  timolol (TIMOPTIC-XR) 0.5 % ophthalmic gel-forming Place 1 drop into both eyes daily.   Yes [provider]  Brinzolamide-Brimonidine [facility]) 1-0.2 % SUSP  01/05/22   [provider]  doxazosin (CARDURA) 8 MG tablet Take 0.5 tablets by mouth 2 (two) times daily. Patient not taking: Reported on 05/01/2022 11/29/21   [provider]  Semaglutide,0.25 or 0.5MG /DOS, 2 MG/3ML SOPN Inject 0.25 mg into the skin once a week. 04/04/22   Alisa Graff, FNP    Physical Exam: Vitals:   05/03/22 1700 05/03/22 1715 05/03/22 1730 05/03/22 1800  BP: (!) 141/95 (!) 149/80 137/84 (!) 147/97  Pulse: 83 88 85 76  Resp: 20 (!) 21 17 17   Temp:      SpO2: 97% 99% 96% 96%  Weight:      Height:       General: Not in acute distress HEENT:       Eyes: PERRL, EOMI, no scleral icterus.       ENT: No discharge from the ears and nose, no pharynx injection, no tonsillar enlargement.        Neck: positive JVD, no bruit, no mass felt. Heme: No neck lymph node enlargement. Cardiac: S1/S2, RRR, No murmurs, No gallops or rubs. Respiratory: Has fine crackles at the bases bilaterally GI: Soft, nondistended, nontender, no rebound pain, no organomegaly, BS present. GU: No hematuria Ext: has trace leg edema bilaterally. 1+DP/PT pulse bilaterally. Musculoskeletal: No joint deformities, No joint redness or warmth, no limitation of ROM in spin. Skin: No rashes.  Neuro: Alert, oriented X3, cranial nerves II-XII grossly intact, moves all extremities normally. Psych: Patient is not psychotic, no suicidal or hemocidal ideation.  Labs on Admission: I have  personally reviewed following labs and imaging studies  CBC: Recent Labs  Lab 05/03/22 1309 05/03/22 1418  WBC 6.2  --   HGB 15.0 15.0  HCT 45.8 44.0  MCV 85.4  --   PLT 122*  --    Basic Metabolic Panel: Recent Labs  Lab 05/02/22 0736 05/03/22 1418  NA 141 141  K 3.3* 3.1*  CL 104  --   CO2 29  --   GLUCOSE 171*  --   BUN 11  --   CREATININE 1.08  --   CALCIUM 9.2  --    GFR: Estimated Creatinine Clearance: 90.2 mL/min (by C-G formula based on SCr of 1.08 mg/dL). Liver Function Tests: No results for input(s): "AST", "ALT", "ALKPHOS", "BILITOT", "PROT", "ALBUMIN" in the last 168 hours. No results for input(s): "LIPASE", "AMYLASE" in the last 168 hours. No results for input(s): "AMMONIA" in the last 168 hours. Coagulation Profile: No results for input(s): "INR", "PROTIME" in the last 168 hours. Cardiac Enzymes: No results for input(s): "CKTOTAL", "CKMB", "CKMBINDEX", "TROPONINI" in the last 168 hours. BNP (last 3 results) No results for input(s): "PROBNP" in the last 8760 hours. HbA1C: No results for input(s): "HGBA1C" in the last 72 hours. CBG: Recent Labs  Lab 05/03/22 1300 05/03/22 1539  GLUCAP 137* 147*   Lipid Profile: Recent Labs    05/02/22 0736  CHOL 147  HDL 46  LDLCALC 91  TRIG 52  CHOLHDL 3.2   Thyroid Function Tests: No results for input(s): "TSH", "T4TOTAL", "FREET4", "T3FREE", "THYROIDAB" in the last 72 hours. Anemia Panel: No results for input(s): "VITAMINB12", "FOLATE", "FERRITIN", "TIBC", "IRON", "RETICCTPCT" in the last 72 hours. Urine analysis:    Component Value Date/Time   COLORURINE YELLOW 11/19/2019 1144   APPEARANCEUR HAZY (A) 11/19/2019 1144   LABSPEC 1.016 11/19/2019 1144   PHURINE 7.0 11/19/2019 1144   GLUCOSEU NEGATIVE 11/19/2019 1144   HGBUR NEGATIVE 11/19/2019 [place] 11/19/2019 [place] 11/19/2019 1144   PROTEINUR NEGATIVE 11/19/2019 1144   NITRITE NEGATIVE 11/19/2019 [place] 11/19/2019 1144   Sepsis Labs: @LABRCNTIP (procalcitonin:4,lacticidven:4) )No results found for this or any previous visit (from the past 240 hour(s)).   Radiological Exams on Admission: CARDIAC CATHETERIZATION  Result Date: 05/03/2022   Mid RCA to Dist RCA lesion is 30% stenosed.   Ost Cx to Prox Cx lesion is 55% stenosed.   Dist LAD lesion is 60% stenosed.   LV end diastolic pressure is severely elevated.   Hemodynamic findings consistent with moderate pulmonary hypertension.   There is no aortic valve stenosis. Nonischemic Cardiomyopathy: Angiographically Moderate 3-vessel disease Acute on Chronic severe Combined Systolic and Diastolic Heart Failure-known EF of 30 to 35% /Cardiac Index of 2.25. PCWP 38 mmHg, PA mean 41 mmHg, and LVEDP of 40 mmHg. Right Heart Cath Numbers: RAP mean 18 mmHg, RV-EDP 68/11-24 mmHg; PAP 69/39 mmHg-mean 41 mmHg; PCWP 38 to 41 mmHg. Ao sat 95%, PA sat 66%. Cardiac Output 5.59--Index 2.25 PLAN With severe acute on chronic combined systolic and diastolic failure, discussed with the patient and Dr. Garen Lah, the plan will be to admit to the hospital service for management of heart failure.  He has been given 40 mg of Lasix in the Cath Lab and will give another 80 g tonight.  Need to closely follow his potassium levels. Per Dr. Garen Lah, plan for medication management as follows: Acute combined CHF: Converted from Lopressor to carvedilol 12.5 mg twice daily Continue Entresto 49 and 51 mg daily and titrate up as tolerated We will need to determine appropriate dose of outpatient Lasix, but for now we will do 80 mg IV  twice daily-depending on urine output may or may not need to consider inotropic support If pressure tolerates, would add spironolactone Patient has had a a severe reaction to Jardiance in the past with yeast infection and skin peeling.  Therefore no SGLT2 inhibitor, but consider GLP-1 agonist.  Top Lopressor, stop Norvasc.  Start Coreg 12.5 mg  twice daily, continue Entresto 49-51 mg twice daily, continue Lasix 20 mg daily.  Schedule right and left heart cath to evaluate ischemic cause.  Titrate GDMT as BP permits.  Consider adding Aldactone at follow-up visit.  Patient did not tolerate Jardiance in the past, developed yeast infection. Hyperlipidemia, continue Lipitor 40 mg daily.  Obtain fasting lipid profile. CAD/coronary calcifications on chest CT. patient has no chest pain.  Angiographically normal coronary arteries.  Continue aspirin & Lipitor. Glenetta Hew, MD      Assessment/Plan Principal Problem:   Acute on chronic combined systolic and diastolic CHF (congestive heart failure) (HCC) Active Problems:   CAD (coronary artery disease)   Diabetes mellitus without complication (HCC)   Hypertension   HLD (hyperlipidemia)   Depression with anxiety   Sleep apnea   Morbid obesity ([place])   Hypokalemia   Assessment and Plan:  Acute on chronic combined systolic and diastolic CHF (congestive heart failure) ([place]): EF of 30 to 35%.  BNP 869.  No oxygen desaturation.  -Will admit to tele as inpatient -Lasix 80 mg bid by IV - coreg and entresto -Daily weights -strict I/O's -Low salt diet -Fluid restriction  CAD (coronary artery disease): -ASA and lipitor  Diabetes mellitus without complication ([place]): Recent A1c 7.0.  Patient is  taking semaglutide at home -SSI  Hypertension -coreg and entresto -IV hydralazine as needed  HLD (hyperlipidemia) -Lipitor  Depression with anxiety -Continue home medications  Sleep apnea -CPAP  Morbid obesity ([place]): BMI 39.06, body weight 130.6 kg, since patient has multiple comorbidities, therefore meets criteria for morbid obesity. -Diet and exercise.   -Encourage to lose weight.  Hypokalemia: Potassium 3.1 -Repleted potassium -Check magnesium level     DVT ppx:    SQ Lovenox  Code Status: Partial code (I discussed with the patient in the presence of his wife, and explained  the meaning of CODE STATUS, patient wants to be partial code, OK for CPR, but no intubation).  Family Communication:     Yes, patient's wife   by phone  Disposition Plan:  Anticipate discharge back to previous environment  Consults called: Dr. Ellyn Hack of card  Admission status and Level of care: Telemetry Cardiac:    as inpt             Dispo: The patient is from: Home              Anticipated d/c is to: Home              Anticipated d/c date is: 2 days              Patient currently is not medically stable to d/c.    Severity of Illness:  The appropriate patient status for this patient is INPATIENT. Inpatient status is judged to be reasonable and necessary in order to provide the required intensity of service to ensure the patient's safety. The patient's presenting symptoms, physical exam findings, and initial radiographic and laboratory data in the context of their chronic comorbidities is felt to place them at high risk for further clinical deterioration. Furthermore, it is not anticipated that the patient will be medically stable for discharge  from the hospital within 2 midnights of admission.   * I certify that at the point of admission it is my clinical judgment that the patient will require inpatient hospital care spanning beyond 2 midnights from the point of admission due to high intensity of service, high risk for further deterioration and high frequency of surveillance required.*       Date of Service 05/03/2022    Ivor Costa [facility]   If 7PM-7AM, please contact night-coverage [email or web address] 05/03/2022, 6:40 PM

## 2022-05-03 NOTE — Progress Notes (Signed)
PHARMACIST - PHYSICIAN COMMUNICATION  CONCERNING:  Enoxaparin (Lovenox) for DVT Prophylaxis    RECOMMENDATION: Patient was prescribed enoxaprin 40mg  q24 hours for VTE prophylaxis.   Filed Weights   05/03/22 1246  Weight: 130.6 kg (288 lb)    Body mass index is 39.06 kg/m.  Estimated Creatinine Clearance: 90.2 mL/min (by C-G formula based on SCr of 1.08 mg/dL).   Based on [place] patient is candidate for enoxaparin 0.5mg /kg TBW SQ every 24 hours based on BMI being >30.  DESCRIPTION: Pharmacy has adjusted enoxaparin dose per [facility] policy.  Patient is now receiving enoxaparin 65 mg every 24 hours    Darrick Penna, PharmD Clinical Pharmacist  05/03/2022 5:20 PM

## 2022-05-03 NOTE — H&P (Signed)
Expand All Collapse All  Cardiology Office Note:     Date:  05/01/2022    ID:  Erik Black, DOB 08-25-1950, MRN [id number]   PCP:  [facility]              [facility] Providers Cardiologist:  Debbe Odea, MD      Referring MD: [facility]        Chief Complaint  Patient presents with   New Patient (Initial Visit)      Chronic Heart Failure, chest pain       History of Present Illness:     Erik Black is a 71 y.o. male with a hx of hypertension, HFrEF EF 30 to 35%, diabetes, former smoker who presents to establish care due to CHF.   His diuretics were stopped by his primary care provider earlier this year, he is not sure why he was on a diuretic.  3 to 4 months later, he had worsening shortness of breath and edema.  Presented to the ED 2 months ago, was given IV Lasix with good effect, shortness of breath improved, discharged home with follow-up at outpatient heart failure clinic.   Has been taking Lasix 20 mg daily, states edema is resolved, shortness of breath is much better.  Denies any history of heart attacks.   His pulmonologist due to low contrast chest CT 01/23/2022 showing mild pleural effusions, coronary artery calcifications.   Echocardiogram 03/2022 EF 30 to 35%       Past Medical History:  Diagnosis Date   Anxiety     Arthritis     CHF (congestive heart failure) (HCC)     Depression     Diabetes mellitus without complication (HCC)     GERD (gastroesophageal reflux disease)     Glaucoma      both eyes   Hypertension     Sleep apnea      uses Cpap nightly           Past Surgical History:  Procedure Laterality Date   CARPAL TUNNEL RELEASE Bilateral     COLONOSCOPY       EYE SURGERY Bilateral      cataract surgery with lens implants   KNEE ARTHROPLASTY Right     KNEE ARTHROPLASTY Left     KNEE ARTHROSCOPY Right     KNEE ARTHROSCOPY Left     MULTIPLE TOOTH EXTRACTIONS       ROTATOR CUFF REPAIR Right      ROTATOR CUFF REPAIR W/ DISTAL CLAVICLE EXCISION Left     TOTAL HIP ARTHROPLASTY Right 05/11/2019    Procedure: RIGHT TOTAL HIP ARTHROPLASTY ANTERIOR APPROACH;  Surgeon: Tarry Kos, MD;  Location: [facility] OR;  Service: Orthopedics;  Laterality: Right;   TOTAL HIP ARTHROPLASTY Left 11/23/2019   TOTAL HIP ARTHROPLASTY Left 11/23/2019    Procedure: LEFT TOTAL HIP ARTHROPLASTY ANTERIOR APPROACH;  Surgeon: Tarry Kos, MD;  Location: [facility] OR;  Service: Orthopedics;  Laterality: Left;   VASECTOMY          Current Medications: Active Medications      Current Meds  Medication Sig   aspirin EC 81 MG tablet Take 1 tablet (81 mg total) by mouth 2 (two) times daily.   atorvastatin (LIPITOR) 40 MG tablet Take 20 mg by mouth at bedtime.   BREZTRI AEROSPHERE 160-9-4.8 MCG/ACT AERO Inhale into the lungs.   brimonidine (ALPHAGAN) 0.2 % ophthalmic solution Place 1 drop into both eyes 3 (three) times daily.  Brinzolamide-Brimonidine (SIMBRINZA) 1-0.2 % SUSP     carvedilol (COREG) 12.5 MG tablet Take 1 tablet (12.5 mg total) by mouth 2 (two) times daily.   Cholecalciferol (VITAMIN D) 50 MCG (2000 UT) tablet Take 4,000 Units by mouth daily.    doxazosin (CARDURA) 8 MG tablet Take 4 mg by mouth 2 (two) times daily.    DULoxetine (CYMBALTA) 60 MG capsule Take 60 mg by mouth daily.   furosemide (LASIX) 20 MG tablet Take 1 tablet (20 mg total) by mouth daily. (Patient taking differently: Take 20 mg by mouth daily. 2 tabs daily)   latanoprost (XALATAN) 0.005 % ophthalmic solution Place 1 drop into both eyes at bedtime.   Multiple Vitamins-Minerals (MULTIVITAMIN WITH MINERALS) tablet Take 1 tablet by mouth daily.   Omega-3 1000 MG CAPS Take 2,000 mg by mouth 3 (three) times daily after meals.   omeprazole (PRILOSEC) 40 MG capsule Take 20 mg by mouth 2 (two) times daily.   OXcarbazepine (TRILEPTAL) 150 MG tablet Take 150 mg by mouth 2 (two) times daily.   potassium chloride SA (KLOR-CON M) 20 MEQ tablet Take 1 tablet  (20 mEq total) by mouth 3 (three) times daily.   sacubitril-valsartan (ENTRESTO) 49-51 MG Take 1 tablet by mouth 2 (two) times daily.   Semaglutide,0.25 or 0.5MG /DOS, 2 MG/3ML SOPN Inject 0.25 mg into the skin once a week.   timolol (TIMOPTIC-XR) 0.5 % ophthalmic gel-forming Place 1 drop into both eyes daily.   [DISCONTINUED] amLODipine (NORVASC) 10 MG tablet Take 10 mg by mouth daily.   [DISCONTINUED] metoprolol tartrate (LOPRESSOR) 50 MG tablet Take 50 mg by mouth 2 (two) times daily.         Allergies:   Jardiance [empagliflozin], Methadone, Oxycodone, and Lisinopril    Social History         Socioeconomic History   Marital status: Married      Spouse name: Not on file   Number of children: Not on file   Years of education: Not on file   Highest education level: Not on file  Occupational History   Not on file  Tobacco Use   Smoking status: Former      Packs/day: 0.10      Types: Cigarettes      Quit date: 05/26/2019      Years since quitting: 2.9   Smokeless tobacco: Never   Tobacco comments:      smokes a pack a month  Vaping Use   Vaping Use: Never used  Substance and Sexual Activity   Alcohol use: Not Currently   Drug use: Not Currently   Sexual activity: Not on file  Other Topics Concern   Not on file  Social History Narrative   Not on file    Social Determinants of Health    Financial Resource Strain: Not on file  Food Insecurity: Not on file  Transportation Needs: Not on file  Physical Activity: Not on file  Stress: Not on file  Social Connections: Not on file      Family History: The patient's family history is not on file.   ROS:   Please see the history of present illness.     All other systems reviewed and are negative.   EKGs/Labs/Other Studies Reviewed:     The following studies were reviewed today:     EKG:  EKG is  ordered today.  The ekg ordered today demonstrates sinus rhythm, PVCs.   Recent Labs: 02/05/2022: B Natriuretic Peptide  633.4; Hemoglobin 13.5;  Platelets 119 04/04/2022: BUN 13; Creatinine, Ser 1.12; Potassium 3.6; Sodium 140  Recent Lipid Panel Labs (Brief)  No results found for: "CHOL", "TRIG", "HDL", "CHOLHDL", "VLDL", "LDLCALC", "LDLDIRECT"       Risk Assessment/Calculations:               Physical Exam:     VS:  BP 138/86 (BP Location: Right Arm)   Pulse 81   Ht 6' (1.829 m)   Wt 292 lb 12.8 oz (132.8 kg)   SpO2 95%   BMI 39.71 kg/m         Wt Readings from Last 3 Encounters:  05/01/22 292 lb 12.8 oz (132.8 kg)  04/04/22 286 lb 8 oz (130 kg)  03/09/22 281 lb (127.5 kg)      GEN:  Well nourished, well developed in no acute distress HEENT: Normal NECK: No JVD; No carotid bruits CARDIAC: RRR, no murmurs, rubs, gallops RESPIRATORY: Diminished breath sounds at bases, no wheezing ABDOMEN: Soft, non-tender, non-distended MUSCULOSKELETAL:  No edema; No deformity  SKIN: Warm and dry NEUROLOGIC:  Alert and oriented x 3 PSYCHIATRIC:  Normal affect    ASSESSMENT:     1. HFrEF (heart failure with reduced ejection fraction) ([place])   2. Primary hypertension   3. Pure hypercholesterolemia   4. Coronary artery disease involving native coronary artery of native heart without angina pectoris     PLAN:     In order of problems listed above:   HFrEF, EF 30 to 35%.  Stop Lopressor, stop Norvasc.  Start Coreg 12.5 mg twice daily, continue Entresto 49-51 mg twice daily, continue Lasix 20 mg daily.  Schedule right and left heart cath to evaluate ischemic cause.  Titrate GDMT as BP permits.  Consider adding Aldactone at follow-up visit.  Patient did not tolerate Jardiance in the past, developed yeast infection. Hypertension, Coreg, Entresto as above. Hyperlipidemia, continue Lipitor 40 mg daily.  Obtain fasting lipid profile. CAD/coronary calcifications on chest CT.  Denies chest pain.  Continue aspirin, Lipitor.  Left heart cath as above.   Follow-up after left heart cath        Shared Decision  Making/Informed Consent The risks [stroke (1 in 1000), death (1 in 1000), kidney failure [usually temporary] (1 in 500), bleeding (1 in 200), allergic reaction [possibly serious] (1 in 200)], benefits (diagnostic support and management of coronary artery disease) and alternatives of a cardiac catheterization were discussed in detail with Erik Black and he is willing to proceed.      Medication Adjustments/Labs and Tests Ordered: Current medicines are reviewed at length with the patient today.  Concerns regarding medicines are outlined above.     Orders Placed This Encounter  Procedures   CBC   Basic metabolic panel   Lipid panel   EKG 12-Lead        Meds ordered this encounter  Medications   carvedilol (COREG) 12.5 MG tablet      Sig: Take 1 tablet (12.5 mg total) by mouth 2 (two) times daily.      Dispense:  60 tablet      Refill:  5      Patient Instructions  Medication Instructions:    Your physician has recommended you make the following change in your medication:     STOP taking your Metoprolol Tartrate.   2.    STOP taking your Amlodipine.   3.    START taking Carvedilol (Coreg) 12.5 MG Twice a day.     *If you  need a refill on your cardiac medications before your next appointment, please call your pharmacy*     Lab Work:   Your physician recommends that you return for a BMP, CBC, and FASTING lipid profile:    TOMORROW MORNING   - You will need to be fasting. Please do not have anything to eat or drink after midnight the morning you have the lab work. You may only have water or black coffee with no cream or sugar.    - Please go to the [facility]. You will check in at the front desk to the right as you walk into the atrium. Valet Parking is offered if needed. - No appointment needed. You may go any day between 7 am and 6 pm.      Testing/Procedures:     You are scheduled for a Cardiac Catheterization on Thursday, November 2 with Dr. Bryan Lemma.    1. Please arrive at [facility] at 12:30 PM (This time is one hour before your procedure to ensure your preparation). Free valet parking service is available.    Special note: Every effort is made to have your procedure done on time. Please understand that emergencies sometimes delay scheduled procedures.   2. Diet: Do not eat solid foods after midnight.  The patient may have clear liquids until 5am upon the day of the procedure.   3. Labs: You will need to have blood drawn on , November 1 at [facility], Go to 1st desk on your right to register.  Address: [address] [place], [place] [address]  Open: 8am - 5pm  Phone: [phone]. You do not need to be fasting.   4. Medication instructions in preparation for your procedure:    Contrast Allergy: No     Stop taking, Lasix (Furosemide)  Thursday, November 2,     On the morning of your procedure, take your Aspirin 81 mg and any morning medicines NOT listed above.  You may use sips of water.   5. Plan for one night stay--bring personal belongings. 6. Bring a current list of your medications and current insurance cards. 7. You MUST have a responsible person to drive you home. 8. Someone MUST be with you the first 24 hours after you arrive home or your discharge will be delayed. 9. Please wear clothes that are easy to get on and off and wear slip-on shoes.   Thank you for allowing [place] to care for you!   --  Invasive Cardiovascular services]     Follow-Up: At [facility], you and your health needs are our priority.  As part of our continuing mission to provide you with exceptional heart care, we have created designated Provider Care Teams.  These Care Teams include your primary Cardiologist (physician) and Advanced Practice Providers (APPs -  Physician Assistants and Nurse Practitioners) who all work together to provide you with the care you need, when you need it.   We recommend signing up for  the patient portal called "MyChart".  Sign up information is provided on this After Visit Summary.  MyChart is used to connect with patients for Virtual Visits (Telemedicine).  Patients are able to view lab/test results, encounter notes, upcoming appointments, etc.  Non-urgent messages can be sent to your provider as well.   To learn more about what you can do with MyChart, go to [email or web address].     Your next appointment:   4-6 week(s)   The format  for your next appointment:   In Person   Provider:     ONLY WITH Debbe Odea, MD     Other Instructions        Signed, Debbe Odea, MD  05/01/2022 10:50 AM    [facility]  ========  History and Physical Interval Note:  05/03/2022 1:46 PM  Erik Black  has presented today for surgery, with the diagnosis of Decreased Ejection Fraction - CARDIOMYOPATHY .  The various methods of treatment have been discussed with the patient and family. After consideration of risks, benefits and other options for treatment, the patient has consented to  Procedure(s): RIGHT/LEFT HEART CATH AND CORONARY ANGIOGRAPHY (Bilateral) PERCUTANEOUS CORONARY INTERVENTION   as a surgical intervention.  The patient's history has been reviewed, patient examined, no change in status, stable for surgery.  I have reviewed the patient's chart and labs.  Questions were answered to the patient's satisfaction.    Cath Lab Visit (complete for each Cath Lab visit)  Clinical Evaluation Leading to the Procedure:   ACS: No.  Non-ACS:    Anginal Classification: No Symptoms  Anti-ischemic medical therapy: Maximal Therapy (2 or more classes of medications)  Non-Invasive Test Results: No non-invasive testing performed - ECHO EF - reduced  Prior CABG: No previous CABG    Bryan Lemma

## 2022-05-03 NOTE — Progress Notes (Signed)
Dr. Ellyn Hack at bedside speaking with pt. And wife re: cath results. Both verbalize understanding of conversation now.

## 2022-05-04 ENCOUNTER — Encounter: Payer: Self-pay | Admitting: Cardiology

## 2022-05-04 ENCOUNTER — Telehealth: Payer: Self-pay

## 2022-05-04 LAB — BASIC METABOLIC PANEL
Anion gap: 10 (ref 5–15)
BUN: 11 mg/dL (ref 8–23)
CO2: 28 mmol/L (ref 22–32)
Calcium: 9.4 mg/dL (ref 8.9–10.3)
Chloride: 104 mmol/L (ref 98–111)
Creatinine, Ser: 1.05 mg/dL (ref 0.61–1.24)
GFR, Estimated: >60 mL/min (ref >60)
Glucose, Bld: 124 mg/dL — ABNORMAL HIGH (ref 70–99)
Potassium: 3.3 mmol/L — ABNORMAL LOW (ref 3.5–5.1)
Sodium: 142 mmol/L (ref 135–145)

## 2022-05-04 LAB — POCT I-STAT EG7
Acid-Base Excess: 3 mmol/L — ABNORMAL HIGH (ref 0.0–2.0)
Bicarbonate: 29.1 mmol/L — ABNORMAL HIGH (ref 20.0–28.0)
Calcium, Ion: 1.21 mmol/L (ref 1.15–1.40)
HCT: 44 % (ref 39.0–52.0)
Hemoglobin: 15 g/dL (ref 13.0–17.0)
O2 Saturation: 66 %
Potassium: 3 mmol/L — ABNORMAL LOW (ref 3.5–5.1)
Sodium: 140 mmol/L (ref 135–145)
TCO2: 31 mmol/L (ref 22–32)
pCO2, Ven: 48.7 mmHg (ref 44–60)
pH, Ven: 7.385 (ref 7.25–7.43)
pO2, Ven: 35 mmHg (ref 32–45)

## 2022-05-04 LAB — GLUCOSE, CAPILLARY
Glucose-Capillary: 114 mg/dL — ABNORMAL HIGH (ref 70–99)
Glucose-Capillary: 179 mg/dL — ABNORMAL HIGH (ref 70–99)
Glucose-Capillary: 115 mg/dL — ABNORMAL HIGH (ref 70–99)
Glucose-Capillary: 152 mg/dL — ABNORMAL HIGH (ref 70–99)

## 2022-05-04 LAB — HIV ANTIBODY (ROUTINE TESTING W REFLEX): HIV Screen 4th Generation wRfx: NONREACTIVE

## 2022-05-04 LAB — MAGNESIUM: Magnesium: 1.8 mg/dL (ref 1.7–2.4)

## 2022-05-04 MED ORDER — POTASSIUM CHLORIDE CRYS ER 20 MEQ PO TBCR
40.0000 meq | EXTENDED_RELEASE_TABLET | Freq: Once | ORAL | Status: AC
  Administered 2022-05-04: 40 meq via ORAL
  Filled 2022-05-04: qty 2

## 2022-05-04 MED ORDER — ATORVASTATIN CALCIUM 20 MG PO TABS
40.0000 mg | ORAL_TABLET | Freq: Every day | ORAL | Status: DC
  Administered 2022-05-04 – 2022-05-06 (×3): 40 mg via ORAL
  Filled 2022-05-04 (×3): qty 2

## 2022-05-04 MED ORDER — SPIRONOLACTONE 25 MG PO TABS
25.0000 mg | ORAL_TABLET | Freq: Every day | ORAL | Status: DC
  Administered 2022-05-05 – 2022-05-07 (×3): 25 mg via ORAL
  Filled 2022-05-04 (×3): qty 1

## 2022-05-04 MED ORDER — SACUBITRIL-VALSARTAN 97-103 MG PO TABS
1.0000 | ORAL_TABLET | Freq: Two times a day (BID) | ORAL | Status: DC
  Administered 2022-05-04 – 2022-05-07 (×6): 1 via ORAL
  Filled 2022-05-04 (×6): qty 1

## 2022-05-04 MED ORDER — SPIRONOLACTONE 12.5 MG HALF TABLET
12.5000 mg | ORAL_TABLET | Freq: Every day | ORAL | Status: DC
  Administered 2022-05-04: 12.5 mg via ORAL
  Filled 2022-05-04: qty 1

## 2022-05-04 NOTE — Progress Notes (Signed)
Progress Note   Patient: Erik Black [id number] DOB: January 24, 1951 DOA: 05/03/2022     1 DOS: the patient was seen and examined on 05/04/2022   Brief hospital course: Taken from H&P.   [name] is a 71 y.o. male with medical history significant of sCHF with EF 30-35%, HTN, HLD, DM, depression with anxiety, OSA on CPAP, CAD, GERD, who presents with shortness of breath.   Patient has history of systolic CHF with EF of 30 to 35%.  Patient is scheduled for cardiac cath today for further evaluation of his cardiomyopathy per Dr. Ellyn Hack of cardiology.  Cardiac catheter showed EF of 30 to 35% /Cardiac Index of 2.25 and PCWP 38 mmHg, PA mean 41 mmHg, and LVEDP of 40 mmHg.  Patient states that he has shortness of breath chronically, which has worsened recently.  Patient does not have chest pain, cough, fever or chills.  Patient denies nausea, vomiting, diarrhea or abdominal pain.  No symptoms of UTI.  Patient has trace leg edema.  With severe acute on chronic combined systolic and diastolic failure, discussed with the patient and Dr. Garen Lah, the plan will be to admit to the hospital service for management of heart failure.  Patient was started on IV diuresis. Lopressor was converted to carvedilol 12.5 mg twice daily.  Norvasc was discontinued. Patient has a severe reaction to Jardiance in the past with yeast infection and skin peeling, therefore no SGL 2 inhibitor, but can consider GLP-1 agonist. Cardiology is on board.  11/3: Hemodynamically stable, urinary output good at 3750 mL.  Potassium at 3.3 which is being repleted. BNP at 869.  Patient with nonischemic cardiomyopathy and moderate three-vessel disease.  Cardiology would Black to continue with IV diabetes.  Assessment and Plan: * Acute on chronic combined systolic and diastolic CHF (congestive heart failure) (HCC) EF of 30 to 35%.  BNP 869.  No oxygen requirement. Right and left cardiac catheterization nonischemic  cardiomyopathy. -Continue IV Lasix 80 mg twice daily. -Continue with Entresto and carvedilol -Spironolactone was added -Daily weight and BMP  -Strict intake and output   CAD (coronary artery disease) Cardiac catheterization with moderate three-vessel disease. -Recommending medical management  Diabetes mellitus without complication (HCC)  Recent A1c 7.0.  Patient is  taking semaglutide at home . Unable to tolerate Jardiance in the past due to skin reaction and yeast infection. -Continue with SSI  Hypertension Blood pressure at 142/105 -Continue carvedilol, Entresto, spironolactone was added today along with IV Lasix  HLD (hyperlipidemia) -Statin dose was increased  Depression with anxiety -Continue home medications  Sleep apnea -Continue CPAP at night  Morbid obesity (HCC) Estimated body mass index is 39.06 kg/m as calculated from the following:   Height as of this encounter: 6' (1.829 m).   Weight as of this encounter: 130.6 kg.   -This will complicate overall prognosis  Hypokalemia Potassium at 3.3, magnesium at 1.8 -Replete potassium and monitor    Subjective: Patient was seen and examined today.  Good urinary output.  Denies any chest pain or shortness of breath.  Physical Exam: Vitals:   05/03/22 2329 05/04/22 0720 05/04/22 1138 05/04/22 1142  BP: (!) 152/82 (!) 144/85 (!) 138/105 (!) 142/105  Pulse: 78 95 85 86  Resp: 19 19 18    Temp: 97.7 F (36.5 C) 98.1 F (36.7 C) 97.9 F (36.6 C)   TempSrc: Axillary  Oral   SpO2: 100% 100% 99% 100%  Weight:      Height:       General.  Obese gentleman, in no acute distress. Pulmonary.  Lungs clear bilaterally, normal respiratory effort. CV.  Regular rate and rhythm, no JVD, rub or murmur. Abdomen.  Soft, nontender, nondistended, BS positive. CNS.  Alert and oriented .  No focal neurologic deficit. Extremities.  No edema, no cyanosis, pulses intact and symmetrical. Psychiatry.  Judgment and insight appears  normal.   Data Reviewed: Prior data reviewed  Family Communication: Discussed with wife at bedside  Disposition: Status is: Inpatient Remains inpatient appropriate because: Severity Of illness  Planned Discharge Destination: Home  Time spent: 50 minutes  This record has been created using Systems analyst. Errors have been sought and corrected,but may not always be located. Such creation errors do not reflect on the standard of care.   Author: Lorella Nimrod, MD 05/04/2022 2:49 PM  For on call review [email or web address].

## 2022-05-04 NOTE — Assessment & Plan Note (Signed)
Blood pressure at 142/105 -Continue carvedilol, Entresto, spironolactone was added today along with IV Lasix

## 2022-05-04 NOTE — Assessment & Plan Note (Signed)
Estimated body mass index is 39.06 kg/m as calculated from the following:   Height as of this encounter: 6' (1.829 m).   Weight as of this encounter: 130.6 kg.   -This will complicate overall prognosis

## 2022-05-04 NOTE — Telephone Encounter (Signed)
-----   Message from Kavin Leech, RN sent at 05/03/2022  2:05 PM EDT ----- Spoke to Dr. Garen Lah regarding low potassium. He advised to have patient start Aldactone 25 MG once a day and to get a repeat BMP in one week. I will call patient tomorrow morning as he is currently in the Cath Lab.

## 2022-05-04 NOTE — Evaluation (Signed)
Occupational Therapy Evaluation Patient Details Name: Erik Black MRN: [id number] DOB: March 10, 1951 Today's Date: 05/04/2022   History of Present Illness Erik Black is a 71 y.o. male with medical history significant of sCHF with EF 30-35%, HTN, HLD, DM, depression with anxiety, OSA on CPAP, CAD, GERD, who presents with shortness of breath and tightness in his L chest.     Patient has history of systolic CHF with EF of 30 to 04%.  Patient is scheduled for cardiac cath today for further evaluation of his cardiomyopathy per Dr. Herbie Baltimore of cardiology.  Cardiac catheter showed EF of 30 to 35% /Cardiac Index of 2.25 and PCWP 38 mmHg, PA mean 41 mmHg, and LVEDP of 40 mmHg.  Patient states that he has shortness of breath chronically, which has worsened recently.  Patient does not have chest pain, cough, fever or chills.  Patient denies nausea, vomiting, diarrhea or abdominal pain.  No symptoms of UTI.  Patient has trace leg edema.   Clinical Impression   Patient received for OT evaluation. See flowsheet below for details of function. Generally, patient requiring RW MOD (I) for t/f from chair, RW MOD (I) for functional mobility, and MOD (I) for ADLs. Extensive conversation/education with OT about energy conservation and the need for rest breaks during activity to prevent pt from getting excessively fatigued or short of breath; pt acknowledges understanding. Patient with no further need for OT in acute care; discharge OT services.       Recommendations for follow up therapy are one component of a multi-disciplinary discharge planning process, led by the attending physician.  Recommendations may be updated based on patient status, additional functional criteria and insurance authorization.   Follow Up Recommendations  No OT follow up    Assistance Recommended at Discharge Set up Supervision/Assistance  Patient can return home with the following Assistance with cooking/housework    Functional  Status Assessment  Patient has had a recent decline in their functional status and demonstrates the ability to make significant improvements in function in a reasonable and predictable amount of time.  Equipment Recommendations  None recommended by OT    Recommendations for Other Services       Precautions / Restrictions Precautions Precautions: Fall Restrictions Weight Bearing Restrictions: No      Mobility Bed Mobility                    Transfers Overall transfer level: Needs assistance Equipment used: Rolling walker (2 wheels) Transfers: Sit to/from Stand Sit to Stand: Modified independent (Device/Increase time)                  Balance Overall balance assessment:  (supported with RW in standing and walking; walked approx 40 feet in hallway and stood at sink for oral care.)                                         ADL either performed or assessed with clinical judgement   ADL Overall ADL's : Modified independent                                       General ADL Comments: Pt is able to perform grooming task today standing at sink with RW (which is change from baseline- usually does not use AD) MOD (I)  for oral care. Pt is aware of the need to take breaks due to shortness of breath and fatigue, but he admits he has difficulty with this in practice due to strong work ethic and wanting to finish tasks (gardening, volunteering at food bank).     Vision         Perception     Praxis      Pertinent Vitals/Pain Pain Assessment Pain Assessment: No/denies pain     Hand Dominance Right   Extremity/Trunk Assessment Upper Extremity Assessment Upper Extremity Assessment: Overall WFL for tasks assessed   Lower Extremity Assessment Lower Extremity Assessment: Defer to PT evaluation   Cervical / Trunk Assessment Cervical / Trunk Assessment: Normal   Communication Communication Communication: No difficulties    Cognition Arousal/Alertness: Awake/alert Behavior During Therapy: WFL for tasks assessed/performed Overall Cognitive Status: Within Functional Limits for tasks assessed                                 General Comments: Pleasant and motivated     General Comments       Exercises     Shoulder Instructions      Home Living Family/patient expects to be discharged to:: Private residence Living Arrangements: Spouse/significant other Available Help at Discharge: Available 24 hours/day;Family Type of Home: House Home Access: Stairs to enter Technical brewer of Steps: 3 Entrance Stairs-Rails: Can reach both Home Layout: One level     Bathroom Shower/Tub: Tub/shower unit (has portable shower unit that he uses in his kitchen with a shower chair)   Bathroom Toilet: Handicapped height     Home Equipment: Conservation officer, nature (2 wheels);Rollator (4 wheels);BSC/3in1 (pt unsure if he actually has RW, but thinks he does. Also has reacher, sock aid from prior hip and knee surgeries.)          Prior Functioning/Environment Prior Level of Function : Independent/Modified Independent;Driving             Mobility Comments: gardening, shopping independently. ADLs Comments: Independent with ADLs and IADLs. Pt enjoys helping out at his church's food bank and gardening. Admits to having "crashs" when he "overdoes it" with physical labor and will have to take long breaks (a day or two) to recover. Has baseline shortness of breath.        OT Problem List: Decreased activity tolerance      OT Treatment/Interventions:      OT Goals(Current goals can be found in the care plan section) Acute Rehab OT Goals Patient Stated Goal: go home OT Goal Formulation: With patient  OT Frequency:      Co-evaluation              AM-PAC OT "6 Clicks" Daily Activity     Outcome Measure Help from another person eating meals?: None Help from another person taking care of personal  grooming?: None Help from another person toileting, which includes using toliet, bedpan, or urinal?: None Help from another person bathing (including washing, rinsing, drying)?: None Help from another person to put on and taking off regular upper body clothing?: None Help from another person to put on and taking off regular lower body clothing?: None 6 Click Score: 24   End of Session Equipment Utilized During Treatment: Rolling walker (2 wheels) Nurse Communication: Mobility status  Activity Tolerance: Patient tolerated treatment well Patient left: in chair;with chair alarm set  OT Visit Diagnosis: Unsteadiness on feet (R26.81)  Time: 2353-6144 OT Time Calculation (min): 26 min Charges:  OT General Charges $OT Visit: 1 Visit OT Evaluation $OT Eval Low Complexity: 1 Low OT Treatments $Self Care/Home Management : 8-22 mins  Linward Foster, MS, OTR/L   Alvester Morin 05/04/2022, 4:06 PM

## 2022-05-04 NOTE — Assessment & Plan Note (Signed)
-   Continue home medications 

## 2022-05-04 NOTE — TOC Initial Note (Signed)
Transition of Care [facility]) - Initial/Assessment Note    Patient Details  Name: Erik Black MRN: [id number] Date of Birth: 04-04-1951  [facility]) CM/SW Contact:    Tiburcio Bash, LCSW Phone Number: 05/04/2022, 1:30 PM  Clinical Narrative:                  Patient from home with spouse, agreeable to [place] no preference of agency. Is set up with [facility]. Patient has [place] with Lowanda Foster. Spouse requests 4 prong cane, CSW has requested dme orders from MD. Once in, will order from [employer] to be delivered to hospital room. No there needs at this time.   Expected Discharge Plan: [place] Barriers to Discharge: Continued Medical Work up   Patient Goals and CMS Choice Patient states their goals for this hospitalization and ongoing recovery are:: to go home CMS Medicare.gov Compare Post Acute Care list provided to:: Patient Represenative (must comment) (spouse) Choice offered to / list presented to : Spouse  Expected Discharge Plan and Services Expected Discharge Plan: [place]       Living arrangements for the past 2 months: Single Family Home                                      Prior Living Arrangements/Services Living arrangements for the past 2 months: Single Family Home Lives with:: Spouse                   Activities of Daily Living      Permission Sought/Granted                  Emotional Assessment              Admission diagnosis:  Acute on chronic combined systolic and diastolic CHF (congestive heart failure) (HCC) [I50.43] Acute on chronic combined systolic (congestive) and diastolic (congestive) heart failure (HCC) [I50.43] Patient Active Problem List   Diagnosis Date Noted   HFrEF (heart failure with reduced ejection fraction) ([place]) 05/03/2022   Acute on chronic combined systolic and diastolic CHF (congestive heart failure) ([place]) 05/03/2022   Diabetes mellitus without  complication ([place]) 40/81/4481   Sleep apnea 05/03/2022   Hypertension 05/03/2022   Depression with anxiety 05/03/2022   HLD (hyperlipidemia) 05/03/2022   CAD (coronary artery disease) 05/03/2022   Morbid obesity ([place]) 05/03/2022   Hypokalemia 05/03/2022   Status post total replacement of left hip 11/23/2019   Primary osteoarthritis of left hip 10/22/2019   Status post total replacement of right hip 05/11/2019   PCP:  [place]:   [id number] - [place], [place] - [address] [place] [address] Phone: [phone] Fax: [phone]     Social Determinants of Health (SDOH) Interventions    Readmission Risk Interventions     No data to display

## 2022-05-04 NOTE — Assessment & Plan Note (Signed)
Cardiac catheterization with moderate three-vessel disease. -Recommending medical management

## 2022-05-04 NOTE — Assessment & Plan Note (Signed)
EF of 30 to 35%.  BNP 869.  No oxygen requirement. Right and left cardiac catheterization nonischemic cardiomyopathy. -Continue IV Lasix 80 mg twice daily. -Continue with Entresto and carvedilol -Spironolactone was added -Daily weight and BMP  -Strict intake and output

## 2022-05-04 NOTE — Assessment & Plan Note (Signed)
-  Statin dose was increased

## 2022-05-04 NOTE — Hospital Course (Addendum)
Taken from H&P.   Erik Black is a 71 y.o. male with medical history significant of sCHF with EF 30-35%, HTN, HLD, DM, depression with anxiety, OSA on CPAP, CAD, GERD, who presents with shortness of breath.   Patient has history of systolic CHF with EF of 30 to 35%.  Patient is scheduled for cardiac cath today for further evaluation of his cardiomyopathy per Dr. Ellyn Hack of cardiology.  Cardiac catheter showed EF of 30 to 35% /Cardiac Index of 2.25 and PCWP 38 mmHg, PA mean 41 mmHg, and LVEDP of 40 mmHg.  Patient states that he has shortness of breath chronically, which has worsened recently.  Patient does not have chest pain, cough, fever or chills.  Patient denies nausea, vomiting, diarrhea or abdominal pain.  No symptoms of UTI.  Patient has trace leg edema.  With severe acute on chronic combined systolic and diastolic failure, discussed with the patient and Dr. Garen Lah, the plan will be to admit to the hospital service for management of heart failure.  Patient was started on IV diuresis. Lopressor was converted to carvedilol 12.5 mg twice daily.  Norvasc was discontinued. Patient has a severe reaction to Jardiance in the past with yeast infection and skin peeling, therefore no SGL 2 inhibitor, but can consider GLP-1 agonist. Cardiology is on board.  11/3: Hemodynamically stable, urinary output good at 3750 mL.  Potassium at 3.3 which is being repleted. BNP at 869.  Patient with nonischemic cardiomyopathy and moderate three-vessel disease.  Cardiology would like to continue with IV diabetes.  11/4: Continue to diurese well, renal function stable, cardiology would like to Continue with IV diuresis as much as they can.

## 2022-05-04 NOTE — Consult Note (Signed)
   Heart  Failure Nurse Navigator Note  HFrEF 30-35%.  Mild mitral regurgitation.  Mild biatrial enlargement.  He presented from home with complaints of shortness of breath and underwent cardiac catheterization.  Which revealed a pulmonary capillary wedge pressure of 38 mmHg, PA mean of 41 and a left ventricular end-diastolic pressure of 40 mmHg.  BNP 869.  Comorbidities:  Depression-anxiety Diabetes Hypertension Hyperlipidemia Struct of sleep apnea on CPAP Coronary artery disease GERD  Medications:  Aspirin 81 mg daily Carvedilol 12 and half milligrams 2 times a day Furosemide 80 mg IV 2 times a day Entresto 49/51 mg 2 times a day Spironolactone 12-1/2 mg daily  Labs:  Sodium 142, potassium 3.3, chloride 104, CO2 28, BUN 11, creatinine 1.05 Weight not documented Intake 740 mL Output 3750 milliliters Blood pressure 142/105   Initial meeting with patient and his wife.  Went over the importance of daily weights and reporting a 2 to 3 pound weight gain overnight or total of 5 pounds within the week.  Also discussed the importance of fluid restriction and not drinking any more than 64 ounces.  When I asked the patient to me an idea of how much he drinks he says although I drink a lot of water because I am thirsty all the time.  We then discussed ways to treat thirst without taking in more fluids.  Was given printed handout.  Discussed also salt and sodium.  They have not used salt at the table for many years, wife states that she uses Mrs. Dash.  Wife states that they do not eat at restaurants, she states once in a while she will get a rotisserie chicken from one of the local grocery stores along with baked beans and coleslaw.  Talked about using the rotisserie chicken that has not been seasoned with the store seasoning.  Discussed follow-up in the outpatient heart failure clinic for which she has an appointment on November 10 at 1:30 in the afternoon.  He has a 3% no-show which  is 1 out of 29 appointments.  Was given the living with heart failure handbook, zone magnet, info on low-sodium and heart failure along with weight chart.  Pricilla Riffle RN CHFN

## 2022-05-04 NOTE — Assessment & Plan Note (Signed)
Recent A1c 7.0.  Patient is  taking semaglutide at home . Unable to tolerate Jardiance in the past due to skin reaction and yeast infection. -Continue with SSI

## 2022-05-04 NOTE — Evaluation (Signed)
Physical Therapy Evaluation Patient Details Name: Erik Black MRN: [id number] DOB: 1950-09-23 Today's Date: 05/04/2022  History of Present Illness  Erik Black is a 71 y.o. male with medical history significant of sCHF with EF 30-35%, HTN, HLD, DM, depression with anxiety, OSA on CPAP, CAD, GERD, who presents with shortness of breath and tightness in his L chest.     Patient has history of systolic CHF with EF of 30 to 56%.  Patient is scheduled for cardiac cath today for further evaluation of his cardiomyopathy per Dr. Herbie Baltimore of cardiology.  Cardiac catheter showed EF of 30 to 35% /Cardiac Index of 2.25 and PCWP 38 mmHg, PA mean 41 mmHg, and LVEDP of 40 mmHg.  Patient states that he has shortness of breath chronically, which has worsened recently.  Patient does not have chest pain, cough, fever or chills.  Patient denies nausea, vomiting, diarrhea or abdominal pain.  No symptoms of UTI.  Patient has trace leg edema.  Clinical Impression  Pt received in bed with wife by his side agreeable to PT evaluation. Pleasant and attentive. PLOF Independent at household and community level activity participation including driving and gardening.  BP remained between 140/97  to 144 /98 with change of position and after exertion. Pt A and O x 4. Bed mobility with Min assist, Transfers with CGA of 1, Ambulated in room and hallways with CGA of 1 with RPE 6/10 and dizziness. Pt in chair without distress but fatigued. Pt educated about his cardiac condition and regarding RPE scale and to maintain  <6/10 including pt's condition with all exertion to prevent adverse effect vitals. Rest to lower the RPE to 1/10.  Pt and family demonstrated good understanding. PT will continue in acute care. Pt will benefit from [facility] and HHPT after acute care.      Recommendations for follow up therapy are one component of a multi-disciplinary discharge planning process, led by the attending physician.  Recommendations may be  updated based on patient status, additional functional criteria and insurance authorization.  Follow Up Recommendations Home health PT      Assistance Recommended at Discharge Intermittent Supervision/Assistance  Patient can return home with the following  A little help with walking and/or transfers;A little help with bathing/dressing/bathroom;Assistance with cooking/housework;Assist for transportation;Help with stairs or ramp for entrance    Equipment Recommendations None recommended by PT  Recommendations for Other Services       Functional Status Assessment Patient has had a recent decline in their functional status and demonstrates the ability to make significant improvements in function in a reasonable and predictable amount of time.     Precautions / Restrictions Precautions Precautions: Fall Restrictions Weight Bearing Restrictions: No      Mobility  Bed Mobility Overal bed mobility: Needs Assistance Bed Mobility: Supine to Sit     Supine to sit: Min assist          Transfers Overall transfer level: Needs assistance Equipment used: Rolling walker (2 wheels) Transfers: Sit to/from Stand, Bed to chair/wheelchair/BSC Sit to Stand: Min guard   Step pivot transfers: Min guard            Ambulation/Gait Ambulation/Gait assistance: Min guard Gait Distance (Feet): 100 Feet Assistive device: Rolling walker (2 wheels) Gait Pattern/deviations: Step-through pattern       General Gait Details: Became dizzy w/o LOB  Stairs: unsafe to attempt today.             Wheelchair Mobility: N/A    Modified Rankin (  Stroke Patients Only)       Balance Overall balance assessment: Needs assistance Sitting-balance support: Feet supported Sitting balance-Leahy Scale: Good     Standing balance support: Bilateral upper extremity supported Standing balance-Leahy Scale: Fair Standing balance comment: 2/2 dizziness with exertion                              Pertinent Vitals/Pain Pain Assessment Pain Assessment: No/denies pain    Home Living Family/patient expects to be discharged to:: Private residence Living Arrangements: Spouse/significant other Available Help at Discharge: Available 24 hours/day;Family Type of Home: House Home Access: Stairs to enter Entrance Stairs-Rails: Can reach both Entrance Stairs-Number of Steps: 3   Home Layout: One level Home Equipment: Agricultural consultant (2 wheels);Rollator (4 wheels);BSC/3in1      Prior Function Prior Level of Function : Independent/Modified Independent;Driving             Mobility Comments: gardening, shopping independently. ADLs Comments: Independent with ADLs and IADLs     Hand Dominance   Dominant Hand: Right    Extremity/Trunk Assessment   Upper Extremity Assessment Upper Extremity Assessment: Generalized weakness    Lower Extremity Assessment Lower Extremity Assessment: Generalized weakness       Communication   Communication: No difficulties  Cognition Arousal/Alertness: Awake/alert Behavior During Therapy: WFL for tasks assessed/performed Overall Cognitive Status: Within Functional Limits for tasks assessed                                          General Comments      Exercises     Assessment/Plan    PT Assessment Patient needs continued PT services  PT Problem List Decreased strength       PT Treatment Interventions Gait training;Stair training;Functional mobility training;Therapeutic activities;Therapeutic exercise;Balance training;Neuromuscular re-education    PT Goals (Current goals can be found in the Care Plan section)  Acute Rehab PT Goals Patient Stated Goal: " get better and go home." PT Goal Formulation: With patient Time For Goal Achievement: 05/18/22 Potential to Achieve Goals: Good    Frequency Min 2X/week     Co-evaluation               AM-PAC PT "6 Clicks" Mobility  Outcome Measure Help  needed turning from your back to your side while in a flat bed without using bedrails?: A Little Help needed moving from lying on your back to sitting on the side of a flat bed without using bedrails?: A Little Help needed moving to and from a bed to a chair (including a wheelchair)?: A Little Help needed standing up from a chair using your arms (e.g., wheelchair or bedside chair)?: A Little Help needed to walk in hospital room?: A Little Help needed climbing 3-5 steps with a railing? : Total 6 Click Score: 16    End of Session Equipment Utilized During Treatment: Gait belt Activity Tolerance: Patient tolerated treatment well;Treatment limited secondary to medical complications (Comment);Patient limited by fatigue Patient left: in chair;with call bell/phone within reach;with chair alarm set;with family/visitor present Nurse Communication: Mobility status PT Visit Diagnosis: Unsteadiness on feet (R26.81);Dizziness and giddiness (R42);Muscle weakness (generalized) (M62.81)    Time: 3149-7026 PT Time Calculation (min) (ACUTE ONLY): 26 min   Charges:   PT Evaluation $PT Eval Moderate Complexity: 1 Mod PT Treatments $Gait Training: 8-22 mins  Joaquin Music PT DPT 11:50 AM,05/04/22

## 2022-05-04 NOTE — Assessment & Plan Note (Signed)
Continue CPAP at night ?

## 2022-05-04 NOTE — Assessment & Plan Note (Signed)
Potassium at 3.3, magnesium at 1.8 -Replete potassium and monitor

## 2022-05-04 NOTE — Progress Notes (Signed)
Rounding Note    Patient Name: Erik Black Date of Encounter: 05/04/2022  [place] Cardiologist: Kate Sable, MD   Subjective   Patient seen on AM rounds. Denies any chest pain or shortness of breath. Endorses an occasional heart flutter. -3L output in the last 24 hours. S/p [facility]  79/8/92 with out complications.   Inpatient Medications    Scheduled Meds:  aspirin EC  81 mg Oral Daily   atorvastatin  20 mg Oral QHS   brinzolamide  1 drop Both Eyes TID   And   brimonidine  1 drop Both Eyes TID   carvedilol  12.5 mg Oral BID   DULoxetine  60 mg Oral Daily   enoxaparin (LOVENOX) injection  0.5 mg/kg Subcutaneous Q24H   furosemide  80 mg Intravenous BID   insulin aspart  0-5 Units Subcutaneous QHS   insulin aspart  0-9 Units Subcutaneous TID WC   latanoprost  1 drop Both Eyes QHS   mometasone-formoterol  2 puff Inhalation BID   OXcarbazepine  150 mg Oral BID   pantoprazole  40 mg Oral Daily   potassium chloride  40 mEq Oral Once   sacubitril-valsartan  1 tablet Oral BID   sodium chloride flush  3 mL Intravenous Q12H   umeclidinium bromide  1 puff Inhalation Daily   Continuous Infusions:  sodium chloride     PRN Meds: sodium chloride, acetaminophen, albuterol, dextromethorphan-guaiFENesin, hydrALAZINE, ondansetron (ZOFRAN) IV, sodium chloride flush   Vital Signs    Vitals:   05/03/22 1830 05/03/22 1927 05/03/22 2329 05/04/22 0720  BP: (!) 152/95 (!) 146/106 (!) 152/82 (!) 144/85  Pulse: 72 85 78 95  Resp: (!) 23 18 19 19   Temp:  97.9 F (36.6 C) 97.7 F (36.5 C) 98.1 F (36.7 C)  TempSrc:   Axillary   SpO2: 96% 100% 100% 100%  Weight:      Height:        Intake/Output Summary (Last 24 hours) at 05/04/2022 0924 Last data filed at 05/04/2022 0500 Gross per 24 hour  Intake 742.2 ml  Output 3750 ml  Net -3007.8 ml      05/03/2022   12:46 PM 05/01/2022    9:31 AM 04/04/2022   11:04 AM  Last 3 Weights  Weight (lbs) 288 lb 292 lb  12.8 oz 286 lb 8 oz  Weight (kg) 130.636 kg 132.813 kg 129.956 kg      Telemetry    Sinus with unifocal PVC's  - Personally Reviewed  ECG    No new tracings - Personally Reviewed  Physical Exam   GEN: No acute distress.   Neck: No JVD Cardiac: RRR, no murmurs, rubs, or gallops. Right radial cath site 2+ pulse with gauze and opsite dressing that is clean dry and intact with hematoma or bleeding Respiratory: Diminished to auscultation bilaterally. Respirations are unlabored at rest on room air GI: Soft, nontender, non-distended  MS: No edema; No deformity. Neuro:  Nonfocal  Psych: Normal affect   Labs    High Sensitivity Troponin:  No results for input(s): "TROPONINIHS" in the last 720 hours.   Chemistry Recent Labs  Lab 05/02/22 0736 05/03/22 1418 05/03/22 1429 05/04/22 0714  NA 141 141 140 142  K 3.3* 3.1* 3.0* 3.3*  CL 104  --   --  104  CO2 29  --   --  28  GLUCOSE 171*  --   --  124*  BUN 11  --   --  11  CREATININE 1.08  --   --  1.05  CALCIUM 9.2  --   --  9.4  MG  --   --   --  1.8  GFRNONAA >60  --   --  >60  ANIONGAP 8  --   --  10    Lipids  Recent Labs  Lab 05/02/22 0736  CHOL 147  TRIG 52  HDL 46  LDLCALC 91  CHOLHDL 3.2    Hematology Recent Labs  Lab 05/03/22 1309 05/03/22 1418 05/03/22 1429  WBC 6.2  --   --   RBC 5.36  --   --   HGB 15.0 15.0 15.0  HCT 45.8 44.0 44.0  MCV 85.4  --   --   MCH 28.0  --   --   MCHC 32.8  --   --   RDW 13.2  --   --   PLT 122*  --   --    Thyroid No results for input(s): "TSH", "FREET4" in the last 168 hours.  BNP Recent Labs  Lab 05/03/22 1306  BNP 869.5*    DDimer No results for input(s): "DDIMER" in the last 168 hours.   Radiology      Cardiac Studies  [facility] 05/03/2022   Mid RCA to Dist RCA lesion is 30% stenosed.   Ost Cx to Prox Cx lesion is 55% stenosed.   Dist LAD lesion is 60% stenosed.   LV end diastolic pressure is severely elevated.   Hemodynamic findings consistent with  moderate pulmonary hypertension.   There is no aortic valve stenosis.   Nonischemic Cardiomyopathy: Angiographically Moderate 3-vessel disease Acute on Chronic severe Combined Systolic and Diastolic Heart Failure-known EF of 30 to 35% /Cardiac Index of 2.25.  PCWP 38 mmHg, PA mean 41 mmHg, and LVEDP of 40 mmHg. Right Heart Cath Numbers: RAP mean 18 mmHg, RV-EDP 68/11-24 mmHg; PAP 69/39 mmHg-mean 41 mmHg; PCWP 38 to 41 mmHg. Ao sat 95%, PA sat 66%. Cardiac Output 5.59--Index 2.25    PLAN With severe acute on chronic combined systolic and diastolic failure, discussed with the patient and Dr. Garen Lah, the plan will be to admit to the hospital service for management of heart failure.  He has been given 40 mg of Lasix in the Cath Lab and will give another 80 g tonight.  Need to closely follow his potassium levels.   TTE 03/07/22 1. Left ventricular ejection fraction, by estimation, is 30 to 35%. The  left ventricle has moderately decreased function. The left ventricle  demonstrates global hypokinesis. The left ventricular internal cavity size  was mildly dilated. Left ventricular  diastolic parameters were normal.   2. Right ventricular systolic function is normal. The right ventricular  size is normal.   3. Left atrial size was mildly dilated.   4. Right atrial size was mildly dilated.   5. The mitral valve is normal in structure. Mild mitral valve  regurgitation.   6. The aortic valve is normal in structure. Aortic valve regurgitation is  not visualized.   Patient Profile     71 y.o. male with a history of hypertension, HFrEF EF 30-35%, diabetes, former smoker, who is being seen and evaluated for HFrEF.   Assessment & Plan    HFrEF with EF 30-35% - -3L output in the last 24 hours -continued on coreg, entresto -continue furosemide 80 mg IVP BID -daily bmp while on diuretic therapy -previously did not tolerated jardiance due to yeast infection, no addition of SGLT2i -  will add  spirolactone 12.5 mg daily  -continue to escalate GDMT as tolerated -CHF education -daily weight, I&O, low sodium diet  Hypertension -blood pressure 144/85 -Continued on carvedilol 12.5 mg twice daily, furosemide 80 mg IV twice daily, and Entresto 49/51 mg twice daily -Vital signs per unit protocol  Hyperlipidemia -LDL 91 -Atorvastatin increased to 40 mg daily -Repeat lipid and hepatic panel in 8 to 10 weeks  Non-obstructive coronary artery disease -Right and left heart catheterization completed yesterday which revealed mid to distal RCA lesion 30% stenosis, ostial circumflex with proximal circumflex lesion 50% stenosis, distal LAD lesion 60% stenosis with a LVEDP pressure severely elevated -Nonischemic cardiomyopathy with moderate three-vessel disease -Aggressive secondary prevention -Continued on aspirin, statin that was increased  Hypokalemia -Serum potassium 3.3 -Controlled just received 40 mEq -Daily BMP -Recommend keeping levels closer to 4 -Monitor/trend/replete electrolytes as needed      For questions or updates, please contact [place] Please consult [email or web address] for contact info under        Signed, Meily Glowacki, NP  05/04/2022, 9:24 AM

## 2022-05-05 DIAGNOSIS — Z7189 Other specified counseling: Secondary | ICD-10-CM

## 2022-05-05 LAB — BASIC METABOLIC PANEL
Anion gap: 7 (ref 5–15)
Anion gap: 8 (ref 5–15)
BUN: 16 mg/dL (ref 8–23)
BUN: 16 mg/dL (ref 8–23)
CO2: 29 mmol/L (ref 22–32)
CO2: 28 mmol/L (ref 22–32)
Calcium: 9.7 mg/dL (ref 8.9–10.3)
Calcium: 9.7 mg/dL (ref 8.9–10.3)
Chloride: 104 mmol/L (ref 98–111)
Chloride: 102 mmol/L (ref 98–111)
Creatinine, Ser: 1.14 mg/dL (ref 0.61–1.24)
Creatinine, Ser: 1.14 mg/dL (ref 0.61–1.24)
GFR, Estimated: >60 mL/min (ref >60)
GFR, Estimated: >60 mL/min (ref >60)
Glucose, Bld: 147 mg/dL — ABNORMAL HIGH (ref 70–99)
Glucose, Bld: 207 mg/dL — ABNORMAL HIGH (ref 70–99)
Potassium: 2.9 mmol/L — ABNORMAL LOW (ref 3.5–5.1)
Potassium: 3.1 mmol/L — ABNORMAL LOW (ref 3.5–5.1)
Sodium: 140 mmol/L (ref 135–145)
Sodium: 138 mmol/L (ref 135–145)

## 2022-05-05 LAB — LIPOPROTEIN A (LPA): Lipoprotein (a): 94.9 nmol/L — ABNORMAL HIGH (ref <75.0)

## 2022-05-05 LAB — GLUCOSE, CAPILLARY
Glucose-Capillary: 143 mg/dL — ABNORMAL HIGH (ref 70–99)
Glucose-Capillary: 165 mg/dL — ABNORMAL HIGH (ref 70–99)
Glucose-Capillary: 176 mg/dL — ABNORMAL HIGH (ref 70–99)
Glucose-Capillary: 165 mg/dL — ABNORMAL HIGH (ref 70–99)

## 2022-05-05 MED ORDER — CARVEDILOL 25 MG PO TABS
25.0000 mg | ORAL_TABLET | Freq: Two times a day (BID) | ORAL | Status: DC
  Administered 2022-05-05 – 2022-05-07 (×5): 25 mg via ORAL
  Filled 2022-05-05 (×5): qty 1

## 2022-05-05 MED ORDER — FUROSEMIDE 10 MG/ML IJ SOLN
40.0000 mg | Freq: Two times a day (BID) | INTRAMUSCULAR | Status: DC
  Administered 2022-05-05 – 2022-05-06 (×4): 40 mg via INTRAVENOUS
  Filled 2022-05-05 (×4): qty 4

## 2022-05-05 MED ORDER — POTASSIUM CHLORIDE CRYS ER 20 MEQ PO TBCR
40.0000 meq | EXTENDED_RELEASE_TABLET | ORAL | Status: AC
  Administered 2022-05-05 (×3): 40 meq via ORAL
  Filled 2022-05-05 (×3): qty 2

## 2022-05-05 NOTE — Progress Notes (Signed)
Progress Note   Patient: Erik Black IZT:[id number] DOB: 1950/07/15 DOA: 05/03/2022     2 DOS: the patient was seen and examined on 05/05/2022   Brief hospital course: Taken from H&P.   Erik Black is a 71 y.o. male with medical history significant of sCHF with EF 30-35%, HTN, HLD, DM, depression with anxiety, OSA on CPAP, CAD, GERD, who presents with shortness of breath.   Patient has history of systolic CHF with EF of 30 to 38%.  Patient is scheduled for cardiac cath today for further evaluation of his cardiomyopathy per Dr. Herbie Baltimore of cardiology.  Cardiac catheter showed EF of 30 to 35% /Cardiac Index of 2.25 and PCWP 38 mmHg, PA mean 41 mmHg, and LVEDP of 40 mmHg.  Patient states that he has shortness of breath chronically, which has worsened recently.  Patient does not have chest pain, cough, fever or chills.  Patient denies nausea, vomiting, diarrhea or abdominal pain.  No symptoms of UTI.  Patient has trace leg edema.  With severe acute on chronic combined systolic and diastolic failure, discussed with the patient and Dr. Azucena Cecil, the plan will be to admit to the hospital service for management of heart failure.  Patient was started on IV diuresis. Lopressor was converted to carvedilol 12.5 mg twice daily.  Norvasc was discontinued. Patient has a severe reaction to Jardiance in the past with yeast infection and skin peeling, therefore no SGL 2 inhibitor, but can consider GLP-1 agonist. Cardiology is on board.  11/3: Hemodynamically stable, urinary output good at 3750 mL.  Potassium at 3.3 which is being repleted. BNP at 869.  Patient with nonischemic cardiomyopathy and moderate three-vessel disease.  Cardiology would like to continue with IV diabetes.  11/4: Continue to diurese well, renal function stable, cardiology would like to Continue with IV diuresis as much as they can.  Assessment and Plan: * Acute on chronic combined systolic and diastolic CHF (congestive heart  failure) (HCC) EF of 30 to 35%.  BNP 869.  No oxygen requirement. Right and left cardiac catheterization nonischemic cardiomyopathy. -Continue IV Lasix 80 mg twice daily. -Continue with Entresto and carvedilol -Spironolactone was added -Daily weight and BMP  -Strict intake and output   CAD (coronary artery disease) Cardiac catheterization with moderate three-vessel disease. -Recommending medical management  Diabetes mellitus without complication (HCC)  Recent A1c 7.0.  Patient is  taking semaglutide at home . Unable to tolerate Jardiance in the past due to skin reaction and yeast infection. -Continue with SSI  Hypertension Blood pressure at 142/105 -Continue carvedilol, Entresto, spironolactone was added today along with IV Lasix  HLD (hyperlipidemia) -Statin dose was increased  Depression with anxiety -Continue home medications  Sleep apnea -Continue CPAP at night  Morbid obesity (HCC) Estimated body mass index is 39.06 kg/m as calculated from the following:   Height as of this encounter: 6' (1.829 m).   Weight as of this encounter: 130.6 kg.   -This will complicate overall prognosis  Hypokalemia Potassium at 3.3, magnesium at 1.8 -Replete potassium and monitor    Subjective: Patient was seen and examined today.  No chest pain or shortness of breath.  Physical Exam: Vitals:   05/05/22 0500 05/05/22 0832 05/05/22 0955 05/05/22 1151  BP:  (!) 143/83  (!) 133/95  Pulse:  (!) 42 90 87  Resp:  16  16  Temp:  97.9 F (36.6 C)  (!) 97.5 F (36.4 C)  TempSrc:      SpO2:  99%  100%  Weight: 128.7 kg     Height:       General.  Obese gentleman, in no acute distress. Pulmonary.  Lungs clear bilaterally, normal respiratory effort. CV.  Regular rate and rhythm, no JVD, rub or murmur. Abdomen.  Soft, nontender, nondistended, BS positive. CNS.  Alert and oriented .  No focal neurologic deficit. Extremities.  No edema, no cyanosis, pulses intact and  symmetrical. Psychiatry.  Judgment and insight appears normal.   Data Reviewed: Prior data reviewed  Family Communication: Discussed with wife at bedside  Disposition: Status is: Inpatient Remains inpatient appropriate because: Severity Of illness  Planned Discharge Destination: Home  Time spent: 45 minutes  This record has been created using Systems analyst. Errors have been sought and corrected,but may not always be located. Such creation errors do not reflect on the standard of care.   Author: Lorella Nimrod, MD 05/05/2022 2:02 PM  For on call review [email or web address].

## 2022-05-05 NOTE — TOC Progression Note (Signed)
Transition of Care [facility]) - Progression Note    Patient Details  Name: Erik Black MRN: [id number] Date of Birth: April 28, 1951  Transition of Care [facility]) CM/SW Contact  Izola Price, RN Phone Number: 05/05/2022, 3:18 PM  Clinical Narrative: 11/4: Per provider patient remains in patient as cardiology wishes to continue diuresing. Set up with [facility] on dc on 11/3. Will need a DME 4 prong cane order placed, was requested on 11/3. TOC to continue to monitor for discharge planning needs. Simmie Davies RN CM    Expected Discharge Plan: [place] Barriers to Discharge: Continued Medical Work up  Expected Discharge Plan and Services Expected Discharge Plan: [place] arrangements for the past 2 months: Single Family Home                                       Social Determinants of Health (SDOH) Interventions    Readmission Risk Interventions     No data to display

## 2022-05-05 NOTE — Assessment & Plan Note (Signed)
EF of 30 to 35%.  BNP 869.  No oxygen requirement. Right and left cardiac catheterization nonischemic cardiomyopathy. -Continue IV Lasix 80 mg twice daily. -Continue with Entresto and carvedilol -Spironolactone was added -Daily weight and BMP  -Strict intake and output -Torsemide on discharge

## 2022-05-05 NOTE — Progress Notes (Signed)
Progress Note  Patient Name: Erik Black Date of Encounter: 05/05/2022  Primary Cardiologist: Agbor-Etang  Subjective   Dyspnea stable. No chest pain. Has noted palpitations overnight. Documented UOP 2.2 L for the past 24 hours, net - 5.2 L for the admission. Potassium 2.9, BUN/SCr 11/1.05 to 16/11.4, CO2 and chloride stable.   Inpatient Medications    Scheduled Meds:  aspirin EC  81 mg Oral Daily   atorvastatin  40 mg Oral QHS   brinzolamide  1 drop Both Eyes TID   And   brimonidine  1 drop Both Eyes TID   carvedilol  12.5 mg Oral BID   DULoxetine  60 mg Oral Daily   enoxaparin (LOVENOX) injection  0.5 mg/kg Subcutaneous Q24H   furosemide  80 mg Intravenous BID   insulin aspart  0-5 Units Subcutaneous QHS   insulin aspart  0-9 Units Subcutaneous TID WC   latanoprost  1 drop Both Eyes QHS   mometasone-formoterol  2 puff Inhalation BID   OXcarbazepine  150 mg Oral BID   pantoprazole  40 mg Oral Daily   potassium chloride  40 mEq Oral Q2H   sacubitril-valsartan  1 tablet Oral BID   sodium chloride flush  3 mL Intravenous Q12H   spironolactone  25 mg Oral Daily   umeclidinium bromide  1 puff Inhalation Daily   Continuous Infusions:  sodium chloride     PRN Meds: sodium chloride, acetaminophen, albuterol, dextromethorphan-guaiFENesin, hydrALAZINE, ondansetron (ZOFRAN) IV, sodium chloride flush   Vital Signs    Vitals:   05/04/22 2031 05/04/22 2318 05/05/22 0414 05/05/22 0500  BP: (!) 151/103 (!) 147/102 (!) 146/102   Pulse: 85 80 67   Resp: 18 20 19    Temp: 97.8 F (36.6 C) (!) 96.6 F (35.9 C) 97.8 F (36.6 C)   TempSrc:      SpO2: 95% 98% 100%   Weight:    128.7 kg  Height:        Intake/Output Summary (Last 24 hours) at 05/05/2022 0825 Last data filed at 05/05/2022 0126 Gross per 24 hour  Intake 240 ml  Output 2500 ml  Net -2260 ml   Filed Weights   05/03/22 1246 05/05/22 0500  Weight: 130.6 kg 128.7 kg    Telemetry    SR with frequent  PVCs and short run of NSVT - Personally Reviewed  ECG    No new tracings - Personally Reviewed  Physical Exam   GEN: No acute distress.   Neck: JVD difficult to assess secondary to body habitus. Cardiac: RRR with extra systoles, no murmurs, rubs, or gallops.  Respiratory: Mildly diminished bilaterally.  GI: Soft, nontender, non-distended.   MS: No edema; No deformity. Neuro:  Alert and oriented x 3; Nonfocal.  Psych: Normal affect.  Labs    Chemistry Recent Labs  Lab 05/02/22 0736 05/03/22 1418 05/03/22 1429 05/04/22 0714 05/05/22 0544  NA 141   < > 140 142 140  K 3.3*   < > 3.0* 3.3* 2.9*  CL 104  --   --  104 104  CO2 29  --   --  28 29  GLUCOSE 171*  --   --  124* 147*  BUN 11  --   --  11 16  CREATININE 1.08  --   --  1.05 1.14  CALCIUM 9.2  --   --  9.4 9.7  GFRNONAA >60  --   --  >60 >60  ANIONGAP 8  --   --  10 7   < > = values in this interval not displayed.     Hematology Recent Labs  Lab 05/03/22 1309 05/03/22 1418 05/03/22 1429  WBC 6.2  --   --   RBC 5.36  --   --   HGB 15.0 15.0 15.0  HCT 45.8 44.0 44.0  MCV 85.4  --   --   MCH 28.0  --   --   MCHC 32.8  --   --   RDW 13.2  --   --   PLT 122*  --   --     Cardiac EnzymesNo results for input(s): "TROPONINI" in the last 168 hours. No results for input(s): "TROPIPOC" in the last 168 hours.   BNP Recent Labs  Lab 05/03/22 1306  BNP 869.5*     DDimer No results for input(s): "DDIMER" in the last 168 hours.   Radiology       Cardiac Studies   [facility] 05/03/2022:   Mid RCA to Dist RCA lesion is 30% stenosed.   Ost Cx to Prox Cx lesion is 55% stenosed.   Dist LAD lesion is 60% stenosed.   LV end diastolic pressure is severely elevated.   Hemodynamic findings consistent with moderate pulmonary hypertension.   There is no aortic valve stenosis.   Nonischemic Cardiomyopathy: Angiographically Moderate 3-vessel disease Acute on Chronic severe Combined Systolic and Diastolic Heart  Failure-known EF of 30 to 35% /Cardiac Index of 2.25.  PCWP 38 mmHg, PA mean 41 mmHg, and LVEDP of 40 mmHg. Right Heart Cath Numbers: RAP mean 18 mmHg, RV-EDP 68/11-24 mmHg; PAP 69/39 mmHg-mean 41 mmHg; PCWP 38 to 41 mmHg. Ao sat 95%, PA sat 66%. Cardiac Output 5.59--Index 2.25        PLAN With severe acute on chronic combined systolic and diastolic failure, discussed with the patient and Dr. Garen Lah, the plan will be to admit to the hospital service for management of heart failure.  He has been given 40 mg of Lasix in the Cath Lab and will give another 80 g tonight.  Need to closely follow his potassium levels.   Per Dr. Garen Lah, plan for medication management as follows: Acute combined CHF:  Converted from Lopressor to carvedilol 12.5 mg twice daily Continue Entresto 49 and 51 mg daily and titrate up as tolerated We will need to determine appropriate dose of outpatient Lasix, but for now we will do 80 mg IV twice daily-depending on urine output may or may not need to consider inotropic support  If pressure tolerates, would add spironolactone Patient has had a a severe reaction to Jardiance in the past with yeast infection and skin peeling.  Therefore no SGLT2 inhibitor, but consider GLP-1 agonist.  Top Lopressor, stop Norvasc.  Start Coreg 12.5 mg twice daily, continue Entresto 49-51 mg twice daily, continue Lasix 20 mg daily.  Schedule right and left heart cath to evaluate ischemic cause.  Titrate GDMT as BP permits.  Consider adding Aldactone at follow-up visit.  Patient did not tolerate Jardiance in the past, developed yeast infection.   Hyperlipidemia, continue Lipitor 40 mg daily.  Obtain fasting lipid profile. CAD/coronary calcifications on chest CT. patient has no chest pain.  Angiographically normal coronary arteries.  Continue aspirin & Lipitor. __________  2D echo 03/07/2022: 1. Left ventricular ejection fraction, by estimation, is 30 to 35%. The  left ventricle has  moderately decreased function. The left ventricle  demonstrates global hypokinesis. The left ventricular internal cavity size  was mildly dilated.  Left ventricular  diastolic parameters were normal.   2. Right ventricular systolic function is normal. The right ventricular  size is normal.   3. Left atrial size was mildly dilated.   4. Right atrial size was mildly dilated.   5. The mitral valve is normal in structure. Mild mitral valve  regurgitation.   6. The aortic valve is normal in structure. Aortic valve regurgitation is  not visualized.   Patient Profile     71 y.o. male with history of nonobstructive CAD, chronic combined systolic and diastolic CHF secondary to NICM, DM, HTN, HLD, and prior tobacco use who we are seeing for acute on chronic combined systolic and diastolic CHF.   Assessment & Plan    1. Acute on chronic combined systolic and diastolic CHF secondary to NICM: -Dyspnea stable -Decrease IV Lasix to 40 mg bid  -Documented UOP 5.2 L for the admission -Titrate Coreg to 25 mg bid -Continue Entresto and spironolactone (has not yet received MRA) -Not on SGLT2i due to intolerance secondary to yeast infection -Escalate GDMT as able -CHF education   2. Nonobstructive CAD/HLD: -No chest pain -Cardiac cath this admission without evidence of obstructive disease -Aggressive risk factor modification  -No right radial arteriotomy site complications, post cath instructions -LDL 91 with goal < 70 -Lipitor has been titrated to 40 mg daily, follow up FLP and LFT in the outpatient setting in 2 months  3. HTN: -Blood pressure remains elevated -Continue medical therapy as above  4. Hypokalemia: -Replete to goal 4.0 (already ordered)  5. PVCs/NSVT: -Replete potassium as above -Titrate Coreg -Tele     For questions or updates, please contact [place] [facility] Please consult [email or web address] for contact info under Cardiology/STEMI.    Signed, Christell Faith, PA-C [place] Pager: [phone] 05/05/2022, 8:25 AM

## 2022-05-06 DIAGNOSIS — E876 Hypokalemia: Secondary | ICD-10-CM

## 2022-05-06 LAB — GLUCOSE, CAPILLARY
Glucose-Capillary: 146 mg/dL — ABNORMAL HIGH (ref 70–99)
Glucose-Capillary: 190 mg/dL — ABNORMAL HIGH (ref 70–99)
Glucose-Capillary: 142 mg/dL — ABNORMAL HIGH (ref 70–99)
Glucose-Capillary: 138 mg/dL — ABNORMAL HIGH (ref 70–99)

## 2022-05-06 LAB — BASIC METABOLIC PANEL
Anion gap: 9 (ref 5–15)
BUN: 17 mg/dL (ref 8–23)
CO2: 27 mmol/L (ref 22–32)
Calcium: 9.8 mg/dL (ref 8.9–10.3)
Chloride: 105 mmol/L (ref 98–111)
Creatinine, Ser: 1.24 mg/dL (ref 0.61–1.24)
GFR, Estimated: >60 mL/min (ref >60)
Glucose, Bld: 149 mg/dL — ABNORMAL HIGH (ref 70–99)
Potassium: 3.5 mmol/L (ref 3.5–5.1)
Sodium: 141 mmol/L (ref 135–145)

## 2022-05-06 MED ORDER — POTASSIUM CHLORIDE CRYS ER 20 MEQ PO TBCR
40.0000 meq | EXTENDED_RELEASE_TABLET | Freq: Once | ORAL | Status: AC
  Administered 2022-05-06: 40 meq via ORAL
  Filled 2022-05-06: qty 2

## 2022-05-06 NOTE — Progress Notes (Signed)
Progress Note  Patient Name: Erik Black Date of Encounter: 05/06/2022  Primary Cardiologist: Agbor-Etang  Subjective  No acute events overnight. Continues with good diuresis. Wife at bedside, reviewed HF education again today.  Inpatient Medications    Scheduled Meds:  aspirin EC  81 mg Oral Daily   atorvastatin  40 mg Oral QHS   brinzolamide  1 drop Both Eyes TID   And   brimonidine  1 drop Both Eyes TID   carvedilol  25 mg Oral BID   DULoxetine  60 mg Oral Daily   enoxaparin (LOVENOX) injection  0.5 mg/kg Subcutaneous Q24H   furosemide  40 mg Intravenous BID   insulin aspart  0-5 Units Subcutaneous QHS   insulin aspart  0-9 Units Subcutaneous TID WC   latanoprost  1 drop Both Eyes QHS   mometasone-formoterol  2 puff Inhalation BID   OXcarbazepine  150 mg Oral BID   pantoprazole  40 mg Oral Daily   sacubitril-valsartan  1 tablet Oral BID   sodium chloride flush  3 mL Intravenous Q12H   spironolactone  25 mg Oral Daily   umeclidinium bromide  1 puff Inhalation Daily   Continuous Infusions:  sodium chloride     PRN Meds: sodium chloride, acetaminophen, albuterol, dextromethorphan-guaiFENesin, hydrALAZINE, ondansetron (ZOFRAN) IV, sodium chloride flush   Vital Signs    Vitals:   05/06/22 0351 05/06/22 0351 05/06/22 0400 05/06/22 0742  BP: (!) 149/87  (!) 128/95 136/87  Pulse:  75  73  Resp: 19  20 17   Temp: 97.7 F (36.5 C)  97.9 F (36.6 C) (!) 97.5 F (36.4 C)  TempSrc:   Oral   SpO2: 96% 100% 98% 98%  Weight:      Height:        Intake/Output Summary (Last 24 hours) at 05/06/2022 1149 Last data filed at 05/06/2022 0145 Gross per 24 hour  Intake 180 ml  Output 2100 ml  Net -1920 ml   Filed Weights   05/03/22 1246 05/05/22 0500 05/06/22 0300  Weight: 130.6 kg 128.7 kg 124 kg    Telemetry    SR with occasional PVCs - Personally Reviewed  ECG    No new tracings - Personally Reviewed  Physical Exam   GEN: Well nourished, well  developed in no acute distress NECK: JVD difficult to assess 2/2 body habitus CARDIAC: regular rhythm, normal S1 and S2, no rubs or gallops. No murmur. VASCULAR: Radial pulses 2+ bilaterally.  RESPIRATORY:  Clear to auscultation without rales, wheezing or rhonchi  ABDOMEN: Soft, non-tender, non-distended MUSCULOSKELETAL:  Moves all 4 limbs independently SKIN: Warm and dry, no edema NEUROLOGIC:  No focal neuro deficits noted. PSYCHIATRIC:  Normal affect    Labs    Chemistry Recent Labs  Lab 05/05/22 0544 05/05/22 1411 05/06/22 0502  NA 140 138 141  K 2.9* 3.1* 3.5  CL 104 102 105  CO2 29 28 27   GLUCOSE 147* 207* 149*  BUN 16 16 17   CREATININE 1.14 1.14 1.24  CALCIUM 9.7 9.7 9.8  GFRNONAA >60 >60 >60  ANIONGAP 7 8 9      Hematology Recent Labs  Lab 05/03/22 1309 05/03/22 1418 05/03/22 1429  WBC 6.2  --   --   RBC 5.36  --   --   HGB 15.0 15.0 15.0  HCT 45.8 44.0 44.0  MCV 85.4  --   --   MCH 28.0  --   --   MCHC 32.8  --   --  RDW 13.2  --   --   PLT 122*  --   --     Cardiac EnzymesNo results for input(s): "TROPONINI" in the last 168 hours. No results for input(s): "TROPIPOC" in the last 168 hours.   BNP Recent Labs  Lab 05/03/22 1306  BNP 869.5*     DDimer No results for input(s): "DDIMER" in the last 168 hours.   Radiology       Cardiac Studies   [facility] 05/03/2022:   Mid RCA to Dist RCA lesion is 30% stenosed.   Ost Cx to Prox Cx lesion is 55% stenosed.   Dist LAD lesion is 60% stenosed.   LV end diastolic pressure is severely elevated.   Hemodynamic findings consistent with moderate pulmonary hypertension.   There is no aortic valve stenosis.   Nonischemic Cardiomyopathy: Angiographically Moderate 3-vessel disease Acute on Chronic severe Combined Systolic and Diastolic Heart Failure-known EF of 30 to 35% /Cardiac Index of 2.25.  PCWP 38 mmHg, PA mean 41 mmHg, and LVEDP of 40 mmHg. Right Heart Cath Numbers: RAP mean 18 mmHg, RV-EDP 68/11-24  mmHg; PAP 69/39 mmHg-mean 41 mmHg; PCWP 38 to 41 mmHg. Ao sat 95%, PA sat 66%. Cardiac Output 5.59--Index 2.25        PLAN With severe acute on chronic combined systolic and diastolic failure, discussed with the patient and Dr. Garen Lah, the plan will be to admit to the hospital service for management of heart failure.  He has been given 40 mg of Lasix in the Cath Lab and will give another 80 g tonight.  Need to closely follow his potassium levels.   Per Dr. Garen Lah, plan for medication management as follows: Acute combined CHF:  Converted from Lopressor to carvedilol 12.5 mg twice daily Continue Entresto 49 and 51 mg daily and titrate up as tolerated We will need to determine appropriate dose of outpatient Lasix, but for now we will do 80 mg IV twice daily-depending on urine output may or may not need to consider inotropic support  If pressure tolerates, would add spironolactone Patient has had a a severe reaction to Jardiance in the past with yeast infection and skin peeling.  Therefore no SGLT2 inhibitor, but consider GLP-1 agonist.  Top Lopressor, stop Norvasc.  Start Coreg 12.5 mg twice daily, continue Entresto 49-51 mg twice daily, continue Lasix 20 mg daily.  Schedule right and left heart cath to evaluate ischemic cause.  Titrate GDMT as BP permits.  Consider adding Aldactone at follow-up visit.  Patient did not tolerate Jardiance in the past, developed yeast infection.   Hyperlipidemia, continue Lipitor 40 mg daily.  Obtain fasting lipid profile. CAD/coronary calcifications on chest CT. patient has no chest pain.  Angiographically normal coronary arteries.  Continue aspirin & Lipitor. __________  2D echo 03/07/2022: 1. Left ventricular ejection fraction, by estimation, is 30 to 35%. The  left ventricle has moderately decreased function. The left ventricle  demonstrates global hypokinesis. The left ventricular internal cavity size  was mildly dilated. Left ventricular  diastolic  parameters were normal.   2. Right ventricular systolic function is normal. The right ventricular  size is normal.   3. Left atrial size was mildly dilated.   4. Right atrial size was mildly dilated.   5. The mitral valve is normal in structure. Mild mitral valve  regurgitation.   6. The aortic valve is normal in structure. Aortic valve regurgitation is  not visualized.   Patient Profile     70 y.o. male  with history of nonobstructive CAD, chronic combined systolic and diastolic CHF secondary to NICM, DM, HTN, HLD, and prior tobacco use who we are seeing for acute on chronic combined systolic and diastolic CHF.   Assessment & Plan    1. Acute on chronic combined systolic and diastolic CHF secondary to NICM: -Dyspnea stable -continue IV lasix -admission weight 130.6 kg, weight today 124 kg, net negative 7.8 L -Continue carvedilol, Entresto and spironolactone -Not on SGLT2i due to intolerance secondary to yeast infection and skin reaction -CHF education given again today  2. Nonobstructive CAD/HLD: -No chest pain -Cardiac cath this admission without evidence of obstructive disease -Aggressive risk factor modification  -No right radial arteriotomy site complications -LDL 91 with goal < 70 -Lipitor has been titrated to 40 mg daily, follow up FLP and LFT in the outpatient setting in 2 months  3. HTN: -Blood pressure largely at goal -Continue medical therapy as above  4. Hypokalemia: -Replete to goal 4.0, improved today but not at goal. Repletion already ordered.  5. PVCs/NSVT: -Replete potassium as above -Titrate Coreg -Tele     For questions or updates, please contact [facility] Please consult [email or web address] for contact info under Cardiology/STEMI.    Signed, Buford Dresser, MD, PhD, [facility] at [facility] at [facility] [address], [place], [place] [address] [phone]  05/06/2022, 11:49 AM

## 2022-05-06 NOTE — Progress Notes (Signed)
Progress Note   Patient: Erik Black AYT:[id number] DOB: 11/08/1950 DOA: 05/03/2022     3 DOS: the patient was seen and examined on 05/06/2022   Brief hospital course: Taken from H&P.   Erik Black is a 71 y.o. male with medical history significant of sCHF with EF 30-35%, HTN, HLD, DM, depression with anxiety, OSA on CPAP, CAD, GERD, who presents with shortness of breath.   Patient has history of systolic CHF with EF of 30 to 35%.  Patient is scheduled for cardiac cath today for further evaluation of his cardiomyopathy per Dr. Ellyn Hack of cardiology.  Cardiac catheter showed EF of 30 to 35% /Cardiac Index of 2.25 and PCWP 38 mmHg, PA mean 41 mmHg, and LVEDP of 40 mmHg.  Patient states that he has shortness of breath chronically, which has worsened recently.  Patient does not have chest pain, cough, fever or chills.  Patient denies nausea, vomiting, diarrhea or abdominal pain.  No symptoms of UTI.  Patient has trace leg edema.  With severe acute on chronic combined systolic and diastolic failure, discussed with the patient and Dr. Garen Lah, the plan will be to admit to the hospital service for management of heart failure.  Patient was started on IV diuresis. Lopressor was converted to carvedilol 12.5 mg twice daily.  Norvasc was discontinued. Patient has a severe reaction to Jardiance in the past with yeast infection and skin peeling, therefore no SGL 2 inhibitor, but can consider GLP-1 agonist. Cardiology is on board.  11/3: Hemodynamically stable, urinary output good at 3750 mL.  Potassium at 3.3 which is being repleted. BNP at 869.  Patient with nonischemic cardiomyopathy and moderate three-vessel disease.  Cardiology would like to continue with IV diabetes.  11/4: Continue to diurese well, renal function stable, cardiology would like to Continue with IV diuresis as much as they can.  Patient normally retaining fluid in his belly, will get benefit from adding torsemide on  discharge instead of Lasix.  Assessment and Plan: * Acute on chronic combined systolic and diastolic CHF (congestive heart failure) (HCC) EF of 30 to 35%.  BNP 869.  No oxygen requirement. Right and left cardiac catheterization nonischemic cardiomyopathy. -Continue IV Lasix 80 mg twice daily. -Continue with Entresto and carvedilol -Spironolactone was added -Daily weight and BMP  -Strict intake and output -Torsemide on discharge   CAD (coronary artery disease) Cardiac catheterization with moderate three-vessel disease. -Recommending medical management  Diabetes mellitus without complication (HCC)  Recent A1c 7.0.  Patient is  taking semaglutide at home . Unable to tolerate Jardiance in the past due to skin reaction and yeast infection. -Continue with SSI  Hypertension Blood pressure at 142/105 -Continue carvedilol, Entresto, spironolactone was added today along with IV Lasix  HLD (hyperlipidemia) -Statin dose was increased  Depression with anxiety -Continue home medications  Sleep apnea -Continue CPAP at night  Morbid obesity (HCC) Estimated body mass index is 39.06 kg/m as calculated from the following:   Height as of this encounter: 6' (1.829 m).   Weight as of this encounter: 130.6 kg.   -This will complicate overall prognosis  Hypokalemia Potassium at 3.5, magnesium at 1.8 -Replete potassium and monitor    Subjective: Patient was seen and examined today.  No complaints.  Diuresing well  Physical Exam: Vitals:   05/06/22 0351 05/06/22 0400 05/06/22 0742 05/06/22 1156  BP:  (!) 128/95 136/87 (!) 149/115  Pulse: 75  73 60  Resp:  20 17 17   Temp:  97.9 F (36.6 C) (!)  97.5 F (36.4 C) 97.7 F (36.5 C)  TempSrc:  Oral    SpO2: 100% 98% 98% 98%  Weight:      Height:       General.  Obese gentleman in no acute distress. Pulmonary.  Lungs clear bilaterally, normal respiratory effort. CV.  Regular rate and rhythm, no JVD, rub or murmur. Abdomen.  Soft,  nontender, nondistended, BS positive. CNS.  Alert and oriented .  No focal neurologic deficit. Extremities.  No edema, no cyanosis, pulses intact and symmetrical. Psychiatry.  Judgment and insight appears normal.    Data Reviewed: Prior data reviewed  Family Communication: Discussed with patient  Disposition: Status is: Inpatient Remains inpatient appropriate because: Severity Of illness  Planned Discharge Destination: Home  Time spent: 45 minutes  This record has been created using Conservation officer, historic buildings. Errors have been sought and corrected,but may not always be located. Such creation errors do not reflect on the standard of care.   Author: Arnetha Courser, MD 05/06/2022 3:26 PM  For on call review [email or web address].

## 2022-05-06 NOTE — Assessment & Plan Note (Signed)
Potassium at 3.5, magnesium at 1.8 -Replete potassium and monitor

## 2022-05-07 ENCOUNTER — Ambulatory Visit: Payer: PRIVATE HEALTH INSURANCE | Admitting: Family

## 2022-05-07 LAB — BASIC METABOLIC PANEL
Anion gap: 7 (ref 5–15)
BUN: 22 mg/dL (ref 8–23)
CO2: 27 mmol/L (ref 22–32)
Calcium: 9.8 mg/dL (ref 8.9–10.3)
Chloride: 104 mmol/L (ref 98–111)
Creatinine, Ser: 1.41 mg/dL — ABNORMAL HIGH (ref 0.61–1.24)
GFR, Estimated: 54 mL/min — ABNORMAL LOW (ref >60)
Glucose, Bld: 152 mg/dL — ABNORMAL HIGH (ref 70–99)
Potassium: 3.6 mmol/L (ref 3.5–5.1)
Sodium: 138 mmol/L (ref 135–145)

## 2022-05-07 LAB — GLUCOSE, CAPILLARY
Glucose-Capillary: 148 mg/dL — ABNORMAL HIGH (ref 70–99)
Glucose-Capillary: 181 mg/dL — ABNORMAL HIGH (ref 70–99)

## 2022-05-07 MED ORDER — SPIRONOLACTONE 25 MG PO TABS: 25.0000 mg | ORAL_TABLET | Freq: Every day | ORAL | 1 refills | Status: DC

## 2022-05-07 MED ORDER — TORSEMIDE 20 MG PO TABS: 20.0000 mg | ORAL_TABLET | Freq: Every day | ORAL | 2 refills | Status: DC

## 2022-05-07 MED ORDER — CARVEDILOL 25 MG PO TABS: 25.0000 mg | ORAL_TABLET | Freq: Two times a day (BID) | ORAL | 1 refills | Status: DC

## 2022-05-07 MED ORDER — POTASSIUM CHLORIDE CRYS ER 20 MEQ PO TBCR: 20.0000 meq | EXTENDED_RELEASE_TABLET | Freq: Every day | ORAL | 1 refills | Status: DC

## 2022-05-07 MED ORDER — SACUBITRIL-VALSARTAN 97-103 MG PO TABS: 1.0000 | ORAL_TABLET | Freq: Two times a day (BID) | ORAL | 2 refills | Status: DC

## 2022-05-07 MED ORDER — TORSEMIDE 20 MG PO TABS: 20.0000 mg | ORAL_TABLET | Freq: Every day | ORAL | Status: DC

## 2022-05-07 MED ORDER — FUROSEMIDE 40 MG PO TABS: 40.0000 mg | ORAL_TABLET | Freq: Every day | ORAL | Status: DC

## 2022-05-07 NOTE — TOC Transition Note (Signed)
Transition of Care [facility]) - CM/SW Discharge Note   Patient Details  Name: Nixxon Faria MRN: [id number] Date of Birth: 1951/05/02  Transition of Care [facility]) CM/SW Contact:  Candie Chroman, LCSW Phone Number: 05/07/2022, 12:10 PM   Clinical Narrative:  Patient has orders to discharge home today. [facility] liaison is aware. Ordered 4-prong cane through [employer]. Patient received a RW in 2020 so this was an out-of-pocket cost which [employer] liaison confirmed patient paid so they will deliver to the room. No further concerns. CSW signing off.   Final next level of care: [place] Barriers to Discharge: Barriers Resolved   Patient Goals and CMS Choice Patient states their goals for this hospitalization and ongoing recovery are:: to go home CMS Medicare.gov Compare Post Acute Care list provided to:: Patient Represenative (must comment) (spouse) Choice offered to / list presented to : NA  Discharge Placement                    Patient and family notified of of transfer: 05/07/22  Discharge Plan and Services                DME Arranged: Kasandra Knudsen DME Agency: [employer] Date DME Agency Contacted: 05/07/22   Representative spoke with at DME Agency: Suanne Marker HH Arranged: PT, OT [facility] Agency: [facility] ([place]) Date [place]: 05/07/22   Representative spoke with at [place]: Floydene Flock  Social Determinants of Health (SDOH) Interventions     Readmission Risk Interventions     No data to display

## 2022-05-07 NOTE — Progress Notes (Signed)
   Heart Failure Nurse Navigator Note  Met with patient on 2 different occasions today to discuss fluid restriction.  Was not present during either visit.  States in the past that he has been told by doctors to drink a lot of water/fluids.  Went over the reasoning behind limiting fluids to no more than 64 ounces.  He states at this time that he can drink 10-05-14.9 ounces of bottled water.  Gave him the example that just 4 of those bottles were total limit for intake of 64 ounces.  Addition to that he drinks 08-04-10 ounce cans of ginger ale.  Teach back method went over daily weights and what to report to the heart failure clinic.  He did little reinforcement.  Reminded that he has follow-up in the outpatient heart failure clinic this Friday, November 10.  He had no further questions.  Pricilla Riffle RN CHFN

## 2022-05-07 NOTE — Care Management Important Message (Signed)
Important Message  Patient Details  Name: Erik Black MRN: [id number] Date of Birth: 10/16/1950   Medicare Important Message Given:  Yes     Dannette Barbara 05/07/2022, 1:01 PM

## 2022-05-07 NOTE — Discharge Summary (Signed)
Physician Discharge Summary   Patient: Erik Black MRN: [id number] DOB: 1951-06-24  Admit date:     05/03/2022  Discharge date: 05/07/22  Discharge Physician: Arnetha Courser   PCP: [facility]   Recommendations at discharge:  Please obtain CBC and BMP within a week Follow-up with primary care provider Follow-up with cardiology  Discharge Diagnoses: Principal Problem:   Acute on chronic combined systolic and diastolic CHF (congestive heart failure) (HCC) Active Problems:   CAD (coronary artery disease)   Diabetes mellitus without complication (HCC)   Hypertension   HLD (hyperlipidemia)   Depression with anxiety   Sleep apnea   Morbid obesity (HCC)   Hypokalemia   Encounter for education about heart failure   Hospital Course: Taken from H&P.   Erik Black is a 71 y.o. male with medical history significant of sCHF with EF 30-35%, HTN, HLD, DM, depression with anxiety, OSA on CPAP, CAD, GERD, who presents with shortness of breath.   Patient has history of systolic CHF with EF of 30 to 51%.  Patient is scheduled for cardiac cath today for further evaluation of his cardiomyopathy per Dr. Herbie Baltimore of cardiology.  Cardiac catheter showed EF of 30 to 35% /Cardiac Index of 2.25 and PCWP 38 mmHg, PA mean 41 mmHg, and LVEDP of 40 mmHg.  Patient states that he has shortness of breath chronically, which has worsened recently.  Patient does not have chest pain, cough, fever or chills.  Patient denies nausea, vomiting, diarrhea or abdominal pain.  No symptoms of UTI.  Patient has trace leg edema.  With severe acute on chronic combined systolic and diastolic failure, discussed with the patient and Dr. Azucena Cecil, the plan will be to admit to the hospital service for management of heart failure.  Patient was started on IV diuresis. Lopressor was converted to carvedilol 12.5 mg twice daily.  Norvasc was discontinued. Patient has a severe reaction to Jardiance in the past with  yeast infection and skin peeling, therefore no SGL 2 inhibitor, but can consider GLP-1 agonist. Cardiology is on board.  11/3: Hemodynamically stable, urinary output good at 3750 mL.  Potassium at 3.3 which is being repleted. BNP at 869.  Patient with nonischemic cardiomyopathy and moderate three-vessel disease.  Cardiology would like to continue with IV diabetes.  11/4: Continue to diurese well, renal function stable, cardiology would like to Continue with IV diuresis as much as they can.  Patient normally retaining fluid in his belly, will get benefit from adding torsemide on discharge instead of Lasix.  11/5: No UOP recorded aver last 24 hours.  Creatinine increased to 1.41, IV Lasix was discontinued, he will be transitioned to p.o. torsemide from tomorrow.  Patient is stable for discharge.  Cardiology made some changes to his medications, Lopressor was converted to carvedilol, Entresto dose was increased and Lasix was switched with torsemide.  Patient will continue with current medications and need to have a close follow-up with his providers for further recommendations.  Assessment and Plan: * Acute on chronic combined systolic and diastolic CHF (congestive heart failure) (HCC) EF of 30 to 35%.  BNP 869.  No oxygen requirement. Right and left cardiac catheterization nonischemic cardiomyopathy. -Continue IV Lasix 80 mg twice daily. -Continue with Entresto and carvedilol -Spironolactone was added -Daily weight and BMP  -Strict intake and output -Torsemide on discharge   CAD (coronary artery disease) Cardiac catheterization with moderate three-vessel disease. -Recommending medical management  Diabetes mellitus without complication (HCC)  Recent A1c 7.0.  Patient is  taking semaglutide at home . Unable to tolerate Jardiance in the past due to skin reaction and yeast infection. -Continue with SSI  Hypertension Blood pressure at 142/105 -Continue carvedilol, Entresto,  spironolactone was added today along with IV Lasix  HLD (hyperlipidemia) -Statin dose was increased  Depression with anxiety -Continue home medications  Sleep apnea -Continue CPAP at night  Morbid obesity (HCC) Estimated body mass index is 39.06 kg/m as calculated from the following:   Height as of this encounter: 6' (1.829 m).   Weight as of this encounter: 130.6 kg.   -This will complicate overall prognosis  Hypokalemia Potassium at 3.5, magnesium at 1.8 -Replete potassium and monitor    Consultants: Cardiology Procedures performed: Right and left cardiac catheterization Disposition: Home health Diet recommendation:  Discharge Diet Orders (From admission, onward)     Start     Ordered   05/07/22 0000  Diet - low sodium heart healthy        05/07/22 1051           Cardiac and Carb modified diet DISCHARGE MEDICATION: Allergies as of 05/07/2022       Reactions   Jardiance [empagliflozin] Other (See Comments)   Yeast infection   Methadone    Oxycodone Itching   Hands started peeling and "could not swallow"   Lisinopril Cough        Medication List     STOP taking these medications    doxazosin 8 MG tablet Commonly known as: CARDURA   furosemide 20 MG tablet Commonly known as: LASIX   metoprolol tartrate 50 MG tablet Commonly known as: LOPRESSOR   sacubitril-valsartan 49-51 MG Commonly known as: ENTRESTO Replaced by: sacubitril-valsartan 97-103 MG       TAKE these medications    aspirin EC 81 MG tablet Take 1 tablet (81 mg total) by mouth 2 (two) times daily.   atorvastatin 40 MG tablet Commonly known as: LIPITOR Take 20 mg by mouth at bedtime.   Breztri Aerosphere 160-9-4.8 MCG/ACT Aero Generic drug: Budeson-Glycopyrrol-Formoterol Inhale into the lungs.   brimonidine 0.2 % ophthalmic solution Commonly known as: ALPHAGAN Place 1 drop into both eyes 3 (three) times daily.   carvedilol 25 MG tablet Commonly known as: COREG Take  1 tablet (25 mg total) by mouth 2 (two) times daily. What changed:  medication strength how much to take   DULoxetine 60 MG capsule Commonly known as: CYMBALTA Take 60 mg by mouth daily.   latanoprost 0.005 % ophthalmic solution Commonly known as: XALATAN Place 1 drop into both eyes at bedtime.   multivitamin with minerals tablet Take 1 tablet by mouth daily.   Omega-3 1000 MG Caps Take 2,000 mg by mouth 3 (three) times daily after meals.   Fish Oil 1000 MG Caps Take 1 capsule by mouth in the morning, at noon, and at bedtime.   omeprazole 40 MG capsule Commonly known as: PRILOSEC Take 20 mg by mouth 2 (two) times daily.   OXcarbazepine 150 MG tablet Commonly known as: TRILEPTAL Take 150 mg by mouth 2 (two) times daily.   potassium chloride SA 20 MEQ tablet Commonly known as: KLOR-CON M Take 1 tablet (20 mEq total) by mouth daily. What changed: when to take this   sacubitril-valsartan 97-103 MG Commonly known as: ENTRESTO Take 1 tablet by mouth 2 (two) times daily. Replaces: sacubitril-valsartan 49-51 MG   Semaglutide(0.25 or 0.5MG /DOS) 2 MG/3ML Sopn Inject 0.25 mg into the skin once a week.   Simbrinza 1-0.2 % Susp  Generic drug: Brinzolamide-Brimonidine   spironolactone 25 MG tablet Commonly known as: ALDACTONE Take 1 tablet (25 mg total) by mouth daily. Start taking on: May 08, 2022   timolol 0.5 % ophthalmic gel-forming Commonly known as: TIMOPTIC-XR Place 1 drop into both eyes daily.   torsemide 20 MG tablet Commonly known as: DEMADEX Take 1 tablet (20 mg total) by mouth daily. Start taking on: May 08, 2022   Vitamin D 50 MCG (2000 UT) tablet Take 4,000 Units by mouth daily.               Durable Medical Equipment  (From admission, onward)           Start     Ordered   05/07/22 1051  For home use only DME Cane  Once       Comments: [employer]   05/07/22 1051            Follow-up Information     [facility].  Schedule an appointment as soon as possible for a visit.   Specialty: General Practice Contact information: [address] [place] [address] [phone]         Debbe Odea, MD Follow up in 1 week(s).   Specialties: Cardiology, Radiology Contact information: [address] [place] [address] [phone]                Discharge Exam: Ceasar Mons Weights   05/05/22 0500 05/06/22 0300 05/07/22 0528  Weight: 128.7 kg 124 kg 127.4 kg   General.  Obese gentleman, in no acute distress. Pulmonary.  Lungs clear bilaterally, normal respiratory effort. CV.  Regular rate and rhythm, no JVD, rub or murmur. Abdomen.  Soft, nontender, nondistended, BS positive. CNS.  Alert and oriented .  No focal neurologic deficit. Extremities.  No edema, no cyanosis, pulses intact and symmetrical. Psychiatry.  Judgment and insight appears normal.   Condition at discharge: stable  The results of significant diagnostics from this hospitalization (including imaging, microbiology, ancillary and laboratory) are listed below for reference.   Imaging Studies: CARDIAC CATHETERIZATION  Result Date: 05/03/2022   Mid RCA to Dist RCA lesion is 30% stenosed.   Ost Cx to Prox Cx lesion is 55% stenosed.   Dist LAD lesion is 60% stenosed.   LV end diastolic pressure is severely elevated.   Hemodynamic findings consistent with moderate pulmonary hypertension.   There is no aortic valve stenosis. Nonischemic Cardiomyopathy: Angiographically Moderate 3-vessel disease Acute on Chronic severe Combined Systolic and Diastolic Heart Failure-known EF of 30 to 35% /Cardiac Index of 2.25. PCWP 38 mmHg, PA mean 41 mmHg, and LVEDP of 40 mmHg. Right Heart Cath Numbers: RAP mean 18 mmHg, RV-EDP 68/11-24 mmHg; PAP 69/39 mmHg-mean 41 mmHg; PCWP 38 to 41 mmHg. Ao sat 95%, PA sat 66%. Cardiac Output 5.59--Index 2.25 PLAN With severe acute on chronic combined systolic and diastolic failure, discussed with the  patient and Dr. Azucena Cecil, the plan will be to admit to the hospital service for management of heart failure.  He has been given 40 mg of Lasix in the Cath Lab and will give another 80 g tonight.  Need to closely follow his potassium levels. Per Dr. Azucena Cecil, plan for medication management as follows: Acute combined CHF: Converted from Lopressor to carvedilol 12.5 mg twice daily Continue Entresto 49 and 51 mg daily and titrate up as tolerated We will need to determine appropriate dose of outpatient Lasix, but for now we will do 80 mg IV twice daily-depending on  urine output may or may not need to consider inotropic support If pressure tolerates, would add spironolactone Patient has had a a severe reaction to Jardiance in the past with yeast infection and skin peeling.  Therefore no SGLT2 inhibitor, but consider GLP-1 agonist.  Top Lopressor, stop Norvasc.  Start Coreg 12.5 mg twice daily, continue Entresto 49-51 mg twice daily, continue Lasix 20 mg daily.  Schedule right and left heart cath to evaluate ischemic cause.  Titrate GDMT as BP permits.  Consider adding Aldactone at follow-up visit.  Patient did not tolerate Jardiance in the past, developed yeast infection. Hyperlipidemia, continue Lipitor 40 mg daily.  Obtain fasting lipid profile. CAD/coronary calcifications on chest CT. patient has no chest pain.  Angiographically normal coronary arteries.  Continue aspirin & Lipitor. Glenetta Hew, MD    Microbiology: Results for orders placed or performed during the hospital encounter of 11/19/19  SARS CORONAVIRUS 2 (TAT 6-24 HRS) Nasopharyngeal Nasopharyngeal Swab     Status: None   Collection Time: 11/19/19 12:38 PM   Specimen: Nasopharyngeal Swab  Result Value Ref Range Status   SARS Coronavirus 2 NEGATIVE NEGATIVE Final    Comment: (NOTE) SARS-CoV-2 target nucleic acids are NOT DETECTED. The SARS-CoV-2 RNA is generally detectable in upper and lower respiratory specimens during the acute phase of  infection. Negative results do not preclude SARS-CoV-2 infection, do not rule out co-infections with other pathogens, and should not be used as the sole basis for treatment or other patient management decisions. Negative results must be combined with clinical observations, patient history, and epidemiological information. The expected result is Negative. Fact Sheet for Patients: SugarRoll.be Fact Sheet for Healthcare Providers: https://www.woods-mathews.com/ This test is not yet approved or cleared by the [place] FDA and  has been authorized for detection and/or diagnosis of SARS-CoV-2 by FDA under an Emergency Use Authorization (EUA). This EUA will remain  in effect (meaning this test can be used) for the duration of the COVID-19 declaration under Section 56 4(b)(1) of the Act, 21 U.S.C. section 360bbb-3(b)(1), unless the authorization is terminated or revoked sooner. Performed at [facility], [place] [address] [place], [place] [address]     Labs: CBC: Recent Labs  Lab 05/03/22 1309 05/03/22 1418 05/03/22 1429  WBC 6.2  --   --   HGB 15.0 15.0 15.0  HCT 45.8 44.0 44.0  MCV 85.4  --   --   PLT 122*  --   --    Basic Metabolic Panel: Recent Labs  Lab 05/04/22 0714 05/05/22 0544 05/05/22 1411 05/06/22 0502 05/07/22 0505  NA 142 140 138 141 138  K 3.3* 2.9* 3.1* 3.5 3.6  CL 104 104 102 105 104  CO2 28 29 28 27 27   GLUCOSE 124* 147* 207* 149* 152*  BUN 11 16 16 17 22   CREATININE 1.05 1.14 1.14 1.24 1.41*  CALCIUM 9.4 9.7 9.7 9.8 9.8  MG 1.8  --   --   --   --    Liver Function Tests: No results for input(s): "AST", "ALT", "ALKPHOS", "BILITOT", "PROT", "ALBUMIN" in the last 168 hours. CBG: Recent Labs  Lab 05/06/22 0743 05/06/22 1156 05/06/22 1640 05/06/22 2144 05/07/22 0736  GLUCAP 146* 190* 142* 138* 148*    Discharge time spent: greater than 30 minutes.  This record has been created using Actor. Errors have been sought and corrected,but may not always be located. Such creation errors do not reflect on the standard of care.  Signed: Arnetha Courser, MD [facility] 05/07/2022

## 2022-05-07 NOTE — Progress Notes (Addendum)
Progress Note  Patient Name: Erik Black Date of Encounter: 05/07/2022  Primary Cardiologist: [facility]  Subjective   Dyspnea much improved. Had 2 brief, split second episodes of sharp chest pain overnight. Feels back to his baseline. No further palpitations. Minimal documented UOP over the past 24 hours of 3 mL, net - 7.8 L for the admission. Weight trend 124 to 127.4 kg over the past 24 hours. BUN/SCr 17/1.24 to 22/1.41.   Inpatient Medications    Scheduled Meds:  aspirin EC  81 mg Oral Daily   atorvastatin  40 mg Oral QHS   brinzolamide  1 drop Both Eyes TID   And   brimonidine  1 drop Both Eyes TID   carvedilol  25 mg Oral BID   DULoxetine  60 mg Oral Daily   enoxaparin (LOVENOX) injection  0.5 mg/kg Subcutaneous Q24H   furosemide  40 mg Intravenous BID   insulin aspart  0-5 Units Subcutaneous QHS   insulin aspart  0-9 Units Subcutaneous TID WC   latanoprost  1 drop Both Eyes QHS   mometasone-formoterol  2 puff Inhalation BID   OXcarbazepine  150 mg Oral BID   pantoprazole  40 mg Oral Daily   sacubitril-valsartan  1 tablet Oral BID   sodium chloride flush  3 mL Intravenous Q12H   spironolactone  25 mg Oral Daily   umeclidinium bromide  1 puff Inhalation Daily   Continuous Infusions:  sodium chloride     PRN Meds: sodium chloride, acetaminophen, albuterol, dextromethorphan-guaiFENesin, hydrALAZINE, ondansetron (ZOFRAN) IV, sodium chloride flush   Vital Signs    Vitals:   05/06/22 2129 05/07/22 0101 05/07/22 0528 05/07/22 0734  BP: (!) 125/94 116/89 (!) 146/89 (!) 138/98  Pulse: 73 66 72 (!) 49  Resp: 17 17 18 14   Temp: 98.8 F (37.1 C) (!) 97.5 F (36.4 C) 97.8 F (36.6 C) 97.8 F (36.6 C)  TempSrc: Oral Oral    SpO2: 100% 100% 99% 97%  Weight:   127.4 kg   Height:        Intake/Output Summary (Last 24 hours) at 05/07/2022 0755 Last data filed at 05/06/2022 2145 Gross per 24 hour  Intake 3 ml  Output --  Net 3 ml   Filed Weights    05/05/22 0500 05/06/22 0300 05/07/22 0528  Weight: 128.7 kg 124 kg 127.4 kg    Telemetry    SR with PVCs - Personally Reviewed  ECG    No new tracings - Personally Reviewed  Physical Exam   GEN: No acute distress.   Neck: JVD difficult to assess secondary to body habitus. Cardiac: RRR, no murmurs, rubs, or gallops.  Respiratory: Clear to auscultation bilaterally.  GI: Soft, nontender, non-distended.   MS: No edema; No deformity. Neuro:  Alert and oriented x 3; Nonfocal.  Psych: Normal affect.  Labs    Chemistry Recent Labs  Lab 05/05/22 1411 05/06/22 0502 05/07/22 0505  NA 138 141 138  K 3.1* 3.5 3.6  CL 102 105 104  CO2 28 27 27   GLUCOSE 207* 149* 152*  BUN 16 17 22   CREATININE 1.14 1.24 1.41*  CALCIUM 9.7 9.8 9.8  GFRNONAA >60 >60 54*  ANIONGAP 8 9 7      Hematology Recent Labs  Lab 05/03/22 1309 05/03/22 1418 05/03/22 1429  WBC 6.2  --   --   RBC 5.36  --   --   HGB 15.0 15.0 15.0  HCT 45.8 44.0 44.0  MCV 85.4  --   --  MCH 28.0  --   --   MCHC 32.8  --   --   RDW 13.2  --   --   PLT 122*  --   --     Cardiac EnzymesNo results for input(s): "TROPONINI" in the last 168 hours. No results for input(s): "TROPIPOC" in the last 168 hours.   BNP Recent Labs  Lab 05/03/22 1306  BNP 869.5*     DDimer No results for input(s): "DDIMER" in the last 168 hours.   Radiology    No results found.  Cardiac Studies   [facility] 05/03/2022:   Mid RCA to Dist RCA lesion is 30% stenosed.   Ost Cx to Prox Cx lesion is 55% stenosed.   Dist LAD lesion is 60% stenosed.   LV end diastolic pressure is severely elevated.   Hemodynamic findings consistent with moderate pulmonary hypertension.   There is no aortic valve stenosis.   Nonischemic Cardiomyopathy: Angiographically Moderate 3-vessel disease Acute on Chronic severe Combined Systolic and Diastolic Heart Failure-known EF of 30 to 35% /Cardiac Index of 2.25.  PCWP 38 mmHg, PA mean 41 mmHg, and LVEDP of 40  mmHg. Right Heart Cath Numbers: RAP mean 18 mmHg, RV-EDP 68/11-24 mmHg; PAP 69/39 mmHg-mean 41 mmHg; PCWP 38 to 41 mmHg. Ao sat 95%, PA sat 66%. Cardiac Output 5.59--Index 2.25        PLAN With severe acute on chronic combined systolic and diastolic failure, discussed with the patient and Dr. Azucena Cecil, the plan will be to admit to the hospital service for management of heart failure.  He has been given 40 mg of Lasix in the Cath Lab and will give another 80 g tonight.  Need to closely follow his potassium levels.   Per Dr. Azucena Cecil, plan for medication management as follows: Acute combined CHF:  Converted from Lopressor to carvedilol 12.5 mg twice daily Continue Entresto 49 and 51 mg daily and titrate up as tolerated We will need to determine appropriate dose of outpatient Lasix, but for now we will do 80 mg IV twice daily-depending on urine output may or may not need to consider inotropic support  If pressure tolerates, would add spironolactone Patient has had a a severe reaction to Jardiance in the past with yeast infection and skin peeling.  Therefore no SGLT2 inhibitor, but consider GLP-1 agonist.  Top Lopressor, stop Norvasc.  Start Coreg 12.5 mg twice daily, continue Entresto 49-51 mg twice daily, continue Lasix 20 mg daily.  Schedule right and left heart cath to evaluate ischemic cause.  Titrate GDMT as BP permits.  Consider adding Aldactone at follow-up visit.  Patient did not tolerate Jardiance in the past, developed yeast infection.   Hyperlipidemia, continue Lipitor 40 mg daily.  Obtain fasting lipid profile. CAD/coronary calcifications on chest CT. patient has no chest pain.  Angiographically normal coronary arteries.  Continue aspirin & Lipitor. __________   2D echo 03/07/2022: 1. Left ventricular ejection fraction, by estimation, is 30 to 35%. The  left ventricle has moderately decreased function. The left ventricle  demonstrates global hypokinesis. The left ventricular  internal cavity size  was mildly dilated. Left ventricular  diastolic parameters were normal.   2. Right ventricular systolic function is normal. The right ventricular  size is normal.   3. Left atrial size was mildly dilated.   4. Right atrial size was mildly dilated.   5. The mitral valve is normal in structure. Mild mitral valve  regurgitation.   6. The aortic valve is normal  in structure. Aortic valve regurgitation is  not visualized.   Patient Profile     71 y.o. male with history of nonobstructive CAD, chronic combined systolic and diastolic CHF secondary to NICM, DM, HTN, HLD, and prior tobacco use who we are seeing for acute on chronic combined systolic and diastolic CHF.    Assessment & Plan    1. Acute on chronic combined systolic and diastolic CHF secondary to NICM: -Dyspnea stable -Renal function trending up -Stop IV Lasix  -Transition to torsemide 20 mg daily with a holiday today -Documented UOP 7.8 L for the admission, suspect some inaccuracy in UOP and weight -Coreg to 25 mg bid -Continue Entresto and spironolactone  -Not on SGLT2i due to intolerance secondary to yeast infection -Escalate GDMT as able -CHF education    2. Nonobstructive CAD/HLD: -No chest pain -Cardiac cath this admission without evidence of obstructive disease -Aggressive risk factor modification  -No right radial arteriotomy site complications, post cath instructions -LDL 91 with goal < 70 -Lipitor has been titrated to 40 mg daily, follow up FLP and LFT in the outpatient setting in 2 months   3. HTN: -Blood pressure remains elevated -Continue medical therapy as above   4. Hypokalemia: -Improved  5. PVCs: -May benefit from outpatient cardiac monitoring to quantify burden in the outpatient setting -Asymptomatic  -Coreg as above    For questions or updates, please contact [place] Please consult [email or web address] for contact info under Cardiology/STEMI.    Signed, Christell Faith,  PA-C [place] Pager: [phone] 05/07/2022, 7:55 AM

## 2022-05-08 ENCOUNTER — Ambulatory Visit: Payer: Medicare HMO | Attending: Cardiology | Admitting: Cardiology

## 2022-05-08 ENCOUNTER — Telehealth: Payer: Self-pay

## 2022-05-08 ENCOUNTER — Encounter: Payer: Self-pay | Admitting: Cardiology

## 2022-05-08 ENCOUNTER — Other Ambulatory Visit (HOSPITAL_COMMUNITY): Payer: Self-pay

## 2022-05-08 VITALS — BP 124/82 | HR 72 | Ht 72.0 in | Wt 282.2 lb

## 2022-05-08 DIAGNOSIS — E7800 Pure hypercholesterolemia, unspecified: Secondary | ICD-10-CM | POA: Diagnosis not present

## 2022-05-08 DIAGNOSIS — I10 Essential (primary) hypertension: Secondary | ICD-10-CM | POA: Diagnosis not present

## 2022-05-08 DIAGNOSIS — I2510 Atherosclerotic heart disease of native coronary artery without angina pectoris: Secondary | ICD-10-CM | POA: Diagnosis not present

## 2022-05-08 DIAGNOSIS — I428 Other cardiomyopathies: Secondary | ICD-10-CM | POA: Diagnosis not present

## 2022-05-08 MED ORDER — ATORVASTATIN CALCIUM 40 MG PO TABS: 40.0000 mg | ORAL_TABLET | Freq: Every day | ORAL | 3 refills | Status: DC

## 2022-05-08 NOTE — Progress Notes (Signed)
Cardiology Office Note:    Date:  05/08/2022   ID:  Erik Black, DOB 1951-01-02, MRN [id number]  PCP:  [facility] Cardiologist:  Kate Sable, MD     Referring MD: [place]   Chief Complaint  Patient presents with   [facility] follow up, no new Cardiac concerns     History of Present Illness:    Erik Black is a 71 y.o. male with a hx of nonobstructive CAD ( LHC -05/2022-60% mid to distal LAD, 55% proximal left circumflex, 30% mid RCA), NICM EF 30 to 35%, diabetes, former smoker who presents for follow-up.  Previously seen to establish care due to CHF.  Left heart cath was scheduled to evaluate presence of ischemia due to reduced EF.  No significant obstruction was noted on left heart cath.  LVEDP was elevated, diuresed with IV Lasix, medications for CHF optimize prior to discharge.  Feels well, denies any chest pain or shortness of breath, denies edema.  Compliant with medications as prescribed.  Prior notes Left heart cath 05/2022 60% mid to distal LAD, 55% proximal left circumflex, 30% mid RCA. Echocardiogram 03/2022 EF 30 to 35%  Past Medical History:  Diagnosis Date   Anxiety    Arthritis    CHF (congestive heart failure) (HCC)    Depression    Diabetes mellitus without complication (HCC)    GERD (gastroesophageal reflux disease)    Glaucoma    both eyes   Hypertension    Sleep apnea    uses Cpap nightly    Past Surgical History:  Procedure Laterality Date   CARPAL TUNNEL RELEASE Bilateral    COLONOSCOPY     EYE SURGERY Bilateral    cataract surgery with lens implants   KNEE ARTHROPLASTY Right    KNEE ARTHROPLASTY Left    KNEE ARTHROSCOPY Right    KNEE ARTHROSCOPY Left    MULTIPLE TOOTH EXTRACTIONS     RIGHT/LEFT HEART CATH AND CORONARY ANGIOGRAPHY Bilateral 05/03/2022   Procedure: RIGHT/LEFT HEART CATH AND CORONARY ANGIOGRAPHY;  Surgeon: Leonie Man, MD;  Location: [facility];  Service: Cardiovascular;  Laterality: Bilateral;   ROTATOR CUFF REPAIR Right    ROTATOR CUFF REPAIR W/ DISTAL CLAVICLE EXCISION Left    TOTAL HIP ARTHROPLASTY Right 05/11/2019   Procedure: RIGHT TOTAL HIP ARTHROPLASTY ANTERIOR APPROACH;  Surgeon: Leandrew Koyanagi, MD;  Location: [facility];  Service: Orthopedics;  Laterality: Right;   TOTAL HIP ARTHROPLASTY Left 11/23/2019   TOTAL HIP ARTHROPLASTY Left 11/23/2019   Procedure: LEFT TOTAL HIP ARTHROPLASTY ANTERIOR APPROACH;  Surgeon: Leandrew Koyanagi, MD;  Location: [facility];  Service: Orthopedics;  Laterality: Left;   VASECTOMY      Current Medications: Current Meds  Medication Sig   aspirin EC 81 MG tablet Take 1 tablet (81 mg total) by mouth 2 (two) times daily.   brimonidine (ALPHAGAN) 0.2 % ophthalmic solution Place 1 drop into both eyes 3 (three) times daily.   Brinzolamide-Brimonidine (SIMBRINZA) 1-0.2 % SUSP    carvedilol (COREG) 25 MG tablet Take 1 tablet (25 mg total) by mouth 2 (two) times daily.   Cholecalciferol (VITAMIN D) 50 MCG (2000 UT) tablet Take 4,000 Units by mouth daily.    DULoxetine (CYMBALTA) 60 MG capsule Take 60 mg by mouth daily.   latanoprost (XALATAN) 0.005 % ophthalmic solution Place 1 drop into both eyes at bedtime.   Multiple Vitamins-Minerals (MULTIVITAMIN WITH MINERALS) tablet Take  1 tablet by mouth daily.   Omega-3 1000 MG CAPS Take 2,000 mg by mouth 3 (three) times daily after meals.   Omega-3 Fatty Acids (FISH OIL) 1000 MG CAPS Take 1 capsule by mouth in the morning, at noon, and at bedtime.   omeprazole (PRILOSEC) 40 MG capsule Take 20 mg by mouth 2 (two) times daily.   OXcarbazepine (TRILEPTAL) 150 MG tablet Take 150 mg by mouth 2 (two) times daily.   potassium chloride SA (KLOR-CON M) 20 MEQ tablet Take 1 tablet (20 mEq total) by mouth daily.   sacubitril-valsartan (ENTRESTO) 97-103 MG Take 1 tablet by mouth 2 (two) times daily.   Semaglutide,0.25 or 0.5MG /DOS, 2 MG/3ML SOPN Inject 0.25 mg into the  skin once a week.   spironolactone (ALDACTONE) 25 MG tablet Take 1 tablet (25 mg total) by mouth daily.   timolol (TIMOPTIC-XR) 0.5 % ophthalmic gel-forming Place 1 drop into both eyes daily.   torsemide (DEMADEX) 20 MG tablet Take 1 tablet (20 mg total) by mouth daily.   [DISCONTINUED] atorvastatin (LIPITOR) 40 MG tablet Take 20 mg by mouth at bedtime.     Allergies:   Jardiance [empagliflozin], Methadone, Oxycodone, and Lisinopril   Social History   Socioeconomic History   Marital status: Married    Spouse name: Marcie Bal   Number of children: 0   Years of education: Not on file   Highest education level: Not on file  Occupational History   Not on file  Tobacco Use   Smoking status: Former    Packs/day: 0.10    Types: Cigarettes    Quit date: 05/26/2019    Years since quitting: 2.9   Smokeless tobacco: Never   Tobacco comments:    smokes a pack a month  Vaping Use   Vaping Use: Never used  Substance and Sexual Activity   Alcohol use: Not Currently   Drug use: Not Currently   Sexual activity: Not on file  Other Topics Concern   Not on file  Social History Narrative   Lives at home with wife Marcie Bal and a dog.   Social Determinants of Health   Financial Resource Strain: Not on file  Food Insecurity: Not on file  Transportation Needs: Not on file  Physical Activity: Not on file  Stress: Not on file  Social Connections: Not on file     Family History: The patient's family history includes Diabetes in his mother; Heart disease in his brother, father, and mother; Hypertension in his mother.  ROS:   Please see the history of present illness.     All other systems reviewed and are negative.  EKGs/Labs/Other Studies Reviewed:    The following studies were reviewed today:   EKG:  EKG is  ordered today.  The ekg ordered today demonstrates sinus rhythm, PVCs.  Recent Labs: 05/03/2022: B Natriuretic Peptide 869.5; Hemoglobin 15.0; Platelets 122 05/04/2022: Magnesium  1.8 05/07/2022: BUN 22; Creatinine, Ser 1.41; Potassium 3.6; Sodium 138  Recent Lipid Panel    Component Value Date/Time   CHOL 147 05/02/2022 0736   TRIG 52 05/02/2022 0736   HDL 46 05/02/2022 0736   CHOLHDL 3.2 05/02/2022 0736   VLDL 10 05/02/2022 0736   LDLCALC 91 05/02/2022 0736     Risk Assessment/Calculations:             Physical Exam:    VS:  BP 124/82 (BP Location: Left Arm, Patient Position: Sitting, Cuff Size: Large)   Pulse 72   Ht 6' (1.829 m)  Wt 282 lb 3.2 oz (128 kg)   SpO2 98%   BMI 38.27 kg/m     Wt Readings from Last 3 Encounters:  05/08/22 282 lb 3.2 oz (128 kg)  05/07/22 280 lb 14.4 oz (127.4 kg)  05/01/22 292 lb 12.8 oz (132.8 kg)     GEN:  Well nourished, well developed in no acute distress HEENT: Normal NECK: No JVD; No carotid bruits CARDIAC: RRR, no murmurs, rubs, gallops RESPIRATORY: Diminished breath sounds at bases, no wheezing ABDOMEN: Soft, non-tender, non-distended MUSCULOSKELETAL:  No edema; No deformity  SKIN: Warm and dry NEUROLOGIC:  Alert and oriented x 3 PSYCHIATRIC:  Normal affect   ASSESSMENT:    1. NICM (nonischemic cardiomyopathy) ([place])   2. Coronary artery disease involving native coronary artery of native heart without angina pectoris   3. Primary hypertension   4. Pure hypercholesterolemia    PLAN:    In order of problems listed above:  Nonischemic cardiomyopathy EF 30 to 35%.  Appears euvolemic.  Continue Coreg 25 mg twice daily, Entresto 97-103 mg twice daily, Aldactone 25 mg daily.  Torsemide 20 mg daily.  Check BMP in 1 week, repeat echo in 3 months..  Patient did not tolerate Jardiance in the past, developed yeast infection. Nonobstructive CAD, denies chest pain, aspirin 81 mg, increase Lipitor 40 mg daily Hypertension, Coreg, Entresto as above. Hyperlipidemia, LDL not at goal, increase Lipitor to 40 mg daily  Follow-up in 3 months after repeat echo     Medication Adjustments/Labs and Tests  Ordered: Current medicines are reviewed at length with the patient today.  Concerns regarding medicines are outlined above.  Orders Placed This Encounter  Procedures   Basic metabolic panel   ECHOCARDIOGRAM COMPLETE   Meds ordered this encounter  Medications   atorvastatin (LIPITOR) 40 MG tablet    Sig: Take 1 tablet (40 mg total) by mouth at bedtime.    Dispense:  90 tablet    Refill:  3    Patient Instructions  Medication Instructions:   Your physician has recommended you make the following change in your medication:    INCREASE your Atorvastatin to 40 MG once a day.  *If you need a refill on your cardiac medications before your next appointment, please call your pharmacy*   Lab Work:  Your physician recommends that you return for lab work (BMP) in:  [place]  - Please go to the [facility]. You will check in at the front desk to the right as you walk into the atrium. Valet Parking is offered if needed. - No appointment needed. You may go any day between 7 am and 6 pm.    Testing/Procedures:  Your physician has requested that you have an echocardiogram in 3 months. Echocardiography is a painless test that uses sound waves to create images of your heart. It provides your doctor with information about the size and shape of your heart and how well your heart's chambers and valves are working. This procedure takes approximately one hour. There are no restrictions for this procedure. Please do NOT wear cologne, perfume, aftershave, or lotions (deodorant is allowed). Please arrive 15 minutes prior to your appointment time.    Follow-Up: At [facility], you and your health needs are our priority.  As part of our continuing mission to provide you with exceptional heart care, we have created designated Provider Care Teams.  These Care Teams include your primary Cardiologist (physician) and Advanced Practice Providers (APPs -  Physician Assistants and Nurse  Practitioners) who all work together to provide you with the care you need, when you need it.  We recommend signing up for the patient portal called "MyChart".  Sign up information is provided on this After Visit Summary.  MyChart is used to connect with patients for Virtual Visits (Telemedicine).  Patients are able to view lab/test results, encounter notes, upcoming appointments, etc.  Non-urgent messages can be sent to your provider as well.   To learn more about what you can do with MyChart, go to [email or web address].    Your next appointment:   3-4 month(s)  The format for your next appointment:   In Person  Provider:    ONLY WITH Kate Sable, MD    Other Instructions   Important Information About [place]         Signed, Kate Sable, MD  05/08/2022 9:48 AM    [place]

## 2022-05-08 NOTE — Patient Instructions (Addendum)
Medication Instructions:   Your physician has recommended you make the following change in your medication:    INCREASE your Atorvastatin to 40 MG once a day.  *If you need a refill on your cardiac medications before your next appointment, please call your pharmacy*   Lab Work:  Your physician recommends that you return for lab work (BMP) in:  [place]  - Please go to the [facility]. You will check in at the front desk to the right as you walk into the atrium. Valet Parking is offered if needed. - No appointment needed. You may go any day between 7 am and 6 pm.    Testing/Procedures:  Your physician has requested that you have an echocardiogram in 3 months. Echocardiography is a painless test that uses sound waves to create images of your heart. It provides your doctor with information about the size and shape of your heart and how well your heart's chambers and valves are working. This procedure takes approximately one hour. There are no restrictions for this procedure. Please do NOT wear cologne, perfume, aftershave, or lotions (deodorant is allowed). Please arrive 15 minutes prior to your appointment time.    Follow-Up: At [facility], you and your health needs are our priority.  As part of our continuing mission to provide you with exceptional heart care, we have created designated Provider Care Teams.  These Care Teams include your primary Cardiologist (physician) and Advanced Practice Providers (APPs -  Physician Assistants and Nurse Practitioners) who all work together to provide you with the care you need, when you need it.  We recommend signing up for the patient portal called "MyChart".  Sign up information is provided on this After Visit Summary.  MyChart is used to connect with patients for Virtual Visits (Telemedicine).  Patients are able to view lab/test results, encounter notes, upcoming appointments, etc.  Non-urgent messages can be sent to your provider  as well.   To learn more about what you can do with MyChart, go to [email or web address].    Your next appointment:   3-4 month(s)  The format for your next appointment:   In Person  Provider:    ONLY WITH Kate Sable, MD    Other Instructions   Important Information About [place]

## 2022-05-08 NOTE — Telephone Encounter (Signed)
   Heart Failure Nurse Navigator Note   Call to patient and his wife to offer them an appointment to see the advance heart failure doctor-Dr. Benshimon, that is coming to [facility] clinic to see patients.  I explained that the doctor had looked at his catheterization report and was interested in seeing him.  They accepted the appointment for 10:40 AM on November 9.  Explained that he would be in the same office as Otila Kluver.  And that he would be seeing her in the future. They voice understanding.  Also asked Erik Black how he was doing this morning after being discharged yesterday.  He felt that he was doing well.  He was compliant with the fluid restriction and his weight this morning was 277.8 pounds .Discharge weight was 280.28 pounds  They had no further questions.  Pricilla Riffle RN CHFN

## 2022-05-10 ENCOUNTER — Encounter: Payer: Self-pay | Admitting: Internal Medicine

## 2022-05-10 ENCOUNTER — Ambulatory Visit: Payer: Medicare HMO | Attending: Internal Medicine | Admitting: Internal Medicine

## 2022-05-10 VITALS — BP 140/84 | HR 78 | Resp 16 | Ht 72.0 in | Wt 283.0 lb

## 2022-05-10 DIAGNOSIS — G4733 Obstructive sleep apnea (adult) (pediatric): Secondary | ICD-10-CM | POA: Diagnosis not present

## 2022-05-10 DIAGNOSIS — E119 Type 2 diabetes mellitus without complications: Secondary | ICD-10-CM | POA: Diagnosis not present

## 2022-05-10 DIAGNOSIS — I110 Hypertensive heart disease with heart failure: Secondary | ICD-10-CM | POA: Diagnosis present

## 2022-05-10 DIAGNOSIS — I2510 Atherosclerotic heart disease of native coronary artery without angina pectoris: Secondary | ICD-10-CM | POA: Diagnosis not present

## 2022-05-10 DIAGNOSIS — Z87891 Personal history of nicotine dependence: Secondary | ICD-10-CM | POA: Insufficient documentation

## 2022-05-10 DIAGNOSIS — I5022 Chronic systolic (congestive) heart failure: Secondary | ICD-10-CM | POA: Diagnosis present

## 2022-05-10 DIAGNOSIS — I493 Ventricular premature depolarization: Secondary | ICD-10-CM | POA: Diagnosis not present

## 2022-05-10 DIAGNOSIS — I428 Other cardiomyopathies: Secondary | ICD-10-CM | POA: Diagnosis not present

## 2022-05-10 DIAGNOSIS — Z79899 Other long term (current) drug therapy: Secondary | ICD-10-CM | POA: Diagnosis not present

## 2022-05-10 LAB — BRAIN NATRIURETIC PEPTIDE: B Natriuretic Peptide: 344 pg/mL — ABNORMAL HIGH (ref 0.0–100.0)

## 2022-05-10 LAB — BASIC METABOLIC PANEL
Anion gap: 6 (ref 5–15)
BUN: 20 mg/dL (ref 8–23)
CO2: 28 mmol/L (ref 22–32)
Calcium: 9.4 mg/dL (ref 8.9–10.3)
Chloride: 108 mmol/L (ref 98–111)
Creatinine, Ser: 1.56 mg/dL — ABNORMAL HIGH (ref 0.61–1.24)
GFR, Estimated: 48 mL/min — ABNORMAL LOW (ref >60)
Glucose, Bld: 140 mg/dL — ABNORMAL HIGH (ref 70–99)
Potassium: 3.6 mmol/L (ref 3.5–5.1)
Sodium: 142 mmol/L (ref 135–145)

## 2022-05-10 MED ORDER — SPIRONOLACTONE 25 MG PO TABS: 12.5000 mg | ORAL_TABLET | Freq: Every day | ORAL | 1 refills | Status: DC

## 2022-05-10 NOTE — Patient Instructions (Signed)
Medication Changes:  DECREASE Spironolactone to 12.5 mg (half tablet) daily   Lab Work:  Labs done today, your results will be available in MyChart, we will contact you for abnormal readings.  Labs scheduled for a recheck in 1-2 weeks. Go to [facility] for labs next week.    Testing/Procedures:  Your physician has requested that you have a cardiac MRI. Cardiac MRI uses a computer to create images of your heart as its beating, producing both still and moving pictures of your heart and major blood vessels. For further information please visit [email or web address]. Please follow the instruction sheet given to you today for more information.  Someone will call you to schedule this test.   Referrals:  None  Special Instructions // Education:  Do the following things EVERYDAY: Weigh yourself in the morning before breakfast. Write it down and keep it in a log. Take your medicines as prescribed Eat low salt foods--Limit salt (sodium) to 2000 mg per day.  Stay as active as you can everyday Limit all fluids for the day to less than 2 liters   Follow-Up in:  4-6 weeks     If you have any questions or concerns before your next appointment please send [place] a message through [place] or call our office at [phone]

## 2022-05-10 NOTE — Progress Notes (Signed)
ADVANCED HF CLINIC CONSULT NOTE  Referring Physician: Debbe Odea, MD  Primary Care: [facility] Primary Cardiologist: Debbe Odea, MD   HPI:  Erik Black is a 71 y.o. male with morbid obesity, HTN, OSA on CPAP, DM2 HFrEF EF 30 to 35%, diabetes, former smoker who presents to establish care due to CHF.  Was on diuretics (HCTZ) but stopped in March by [place] doc. Presented to ER in 9/23 with CP. Echo 9/23 EF 30-35%. Started lasix. Referred to [facility]   Admitted 11/23 with ADHF cath as below. Treated with IV lasix and GDMT titrated. Diuresed 22 pounds.    Echocardiogram 03/2022 EF 30 to 35%  Cath: 05/03/22  LAD 60%d LCx 55% prox RCA 30%m  RA 18 RV 68/15 PA 69/39 (41) PCWP 40  Fick 5.6/2.3  Here today with his wife. Had high BP for a long time but [facility] got it under control about 5 years. Smoked 1 pack per month stopped in 9/23. Has kept fluid off. Weight unchanged. Can do all ADLs without problem. No edema, orthopnea or PND. SBP at home 115-130. Weight at home stable at 277   FHx - Mother with HF - 4 Brothers with HF - 1 with ICD - Father with MI     Review of Systems: [y] = yes, [ ]  = no   General: Weight gain [ ] ; Weight loss [ ] ; Anorexia [ ] ; Fatigue [ ] ; Fever [ ] ; Chills [ ] ; Weakness [ ]   Cardiac: Chest pain/pressure ]; Resting SOB [ ] ; Exertional SOB ]; Orthopnea [ ] ; Pedal Edema [ ] ; Palpitations [ ] ; Syncope [ ] ; Presyncope [ ] ; Paroxysmal nocturnal dyspnea[ ]   Pulmonary: Cough [ ] ; Wheezing[ ] ; Hemoptysis[ ] ; Sputum [ ] ; Snoring [ ]   GI: Vomiting[ ] ; Dysphagia[ ] ; Melena[ ] ; Hematochezia [ ] ; Heartburn[ ] ; Abdominal pain [ ] ; Constipation [ ] ; Diarrhea [ ] ; BRBPR [ ]   GU: Hematuria[ ] ; Dysuria [ ] ; Nocturia[ ]   Vascular: Pain in legs with walking [ ] ; Pain in feet with lying flat [ ] ; Non-healing sores [ ] ; Stroke [ ] ; TIA [ ] ; Slurred speech [ ] ;  Neuro: Headaches[ ] ; Vertigo[ ] ; Seizures[ ] ; Paresthesias[ ] ;Blurred vision [ ] ; Diplopia  [ ] ; Vision changes [ ]   Ortho/Skin: Arthritis [ y]; Joint pain ]; Muscle pain [ ] ; Joint swelling [ ] ; Back Pain [ ] ; Rash [ ]   Psych: Depression[ ] ; Anxiety[ ]   Heme: Bleeding problems [ ] ; Clotting disorders [ ] ; Anemia [ ]   Endocrine: Diabetes [ ] ; Thyroid dysfunction[ ]    Past Medical History:  Diagnosis Date   Anxiety    Arthritis    CHF (congestive heart failure) (HCC)    Depression    Diabetes mellitus without complication (HCC)    GERD (gastroesophageal reflux disease)    Glaucoma    both eyes   Hypertension    Sleep apnea    uses Cpap nightly    Current Outpatient Medications  Medication Sig Dispense Refill   aspirin EC 81 MG tablet Take 1 tablet (81 mg total) by mouth 2 (two) times daily. 84 tablet 0   atorvastatin (LIPITOR) 40 MG tablet Take 1 tablet (40 mg total) by mouth at bedtime. 90 tablet 3   BREZTRI AEROSPHERE 160-9-4.8 MCG/ACT AERO Inhale into the lungs.     brimonidine (ALPHAGAN) 0.2 % ophthalmic solution Place 1 drop into both eyes 3 (three) times daily.     Brinzolamide-Brimonidine (SIMBRINZA) 1-0.2 % SUSP  carvedilol (COREG) 25 MG tablet Take 1 tablet (25 mg total) by mouth 2 (two) times daily. 60 tablet 1   Cholecalciferol (VITAMIN D) 50 MCG (2000 UT) tablet Take 4,000 Units by mouth daily.      DULoxetine (CYMBALTA) 60 MG capsule Take 60 mg by mouth daily.     latanoprost (XALATAN) 0.005 % ophthalmic solution Place 1 drop into both eyes at bedtime.     Multiple Vitamins-Minerals (MULTIVITAMIN WITH MINERALS) tablet Take 1 tablet by mouth daily.     Omega-3 1000 MG CAPS Take 2,000 mg by mouth 3 (three) times daily after meals.     Omega-3 Fatty Acids (FISH OIL) 1000 MG CAPS Take 1 capsule by mouth in the morning, at noon, and at bedtime.     omeprazole (PRILOSEC) 40 MG capsule Take 20 mg by mouth 2 (two) times daily.     OXcarbazepine (TRILEPTAL) 150 MG tablet Take 150 mg by mouth 2 (two) times daily.     potassium chloride SA (KLOR-CON M) 20 MEQ  tablet Take 1 tablet (20 mEq total) by mouth daily. 90 tablet 1   sacubitril-valsartan (ENTRESTO) 97-103 MG Take 1 tablet by mouth 2 (two) times daily. 60 tablet 2   Semaglutide,0.25 or 0.5MG /DOS, 2 MG/3ML SOPN Inject 0.25 mg into the skin once a week. 3 mL 5   spironolactone (ALDACTONE) 25 MG tablet Take 1 tablet (25 mg total) by mouth daily. 30 tablet 1   timolol (TIMOPTIC-XR) 0.5 % ophthalmic gel-forming Place 1 drop into both eyes daily.     torsemide (DEMADEX) 20 MG tablet Take 1 tablet (20 mg total) by mouth daily. 30 tablet 2   No current facility-administered medications for this visit.    Allergies  Allergen Reactions   Jardiance [Empagliflozin] Other (See Comments)    Yeast infection   Methadone    Oxycodone Itching    Hands started peeling and "could not swallow"   Lisinopril Cough      Social History   Socioeconomic History   Marital status: Married    Spouse name: Erik Black   Number of children: 0   Years of education: Not on file   Highest education level: Not on file  Occupational History   Not on file  Tobacco Use   Smoking status: Former    Packs/day: 0.10    Types: Cigarettes    Quit date: 05/26/2019    Years since quitting: 2.9   Smokeless tobacco: Never   Tobacco comments:    smokes a pack a month  Vaping Use   Vaping Use: Never used  Substance and Sexual Activity   Alcohol use: Not Currently   Drug use: Not Currently   Sexual activity: Not on file  Other Topics Concern   Not on file  Social History Narrative   Lives at home with wife Erik Black and a dog.   Social Determinants of Health   Financial Resource Strain: Not on file  Food Insecurity: Not on file  Transportation Needs: Not on file  Physical Activity: Not on file  Stress: Not on file  Social Connections: Not on file  Intimate Partner Violence: Not on file      Family History  Problem Relation Age of Onset   Hypertension Mother    Diabetes Mother    Heart disease Mother    Heart  disease Father    Heart disease Brother     Vitals:   05/10/22 1100  BP: (!) 140/84  Pulse: 78  Resp:  16  SpO2: 100%  Weight: 283 lb (128.4 kg)  Height: 6' (1.829 m)    PHYSICAL EXAM: General:  Well appearing. No respiratory difficulty HEENT: normal Neck: supple. no JVD. Carotids 2+ bilat; no bruits. No lymphadenopathy or thryomegaly appreciated. Cor: PMI nondisplaced. Regular rate & rhythm. No rubs, gallops or murmurs. Lungs: clear Abdomen: obese soft, nontender, nondistended. No hepatosplenomegaly. No bruits or masses. Good bowel sounds. Extremities: no cyanosis, clubbing, rash, edema Neuro: alert & oriented x 3, cranial nerves grossly intact. moves all 4 extremities w/o difficulty. Affect pleasant.  ECG: Sinus rhythm with frequent PVCs. Personally reviewed   ASSESSMENT & PLAN:   1. Chronic systolic HF - Echocardiogram 03/2022 EF 30 to 35% - Cath 11/23 non-obstructive CAD - Overall stable NYHA II Volume status looks good  - Continue Entresto to 97/103 bid - Continue carvedilol  - Add spiro 12.5 - Patient did not tolerate Jardiance in the past, developed yeast infection - Etiology of CM unclear. Will ck cMRI - has strong Fhx so will proceed with w/u for TTR amyloid as well as dilated CM - On ECG has frequent PVCs so will need Zio to quantify  2. HTN - Blood pressure well controlled. Meds as above  3. OSA - compliant with CPAP  4. Morbid obesity  - continue GLP-1 RA  Arvilla Meres, MD  11:18 AM

## 2022-05-11 ENCOUNTER — Inpatient Hospital Stay (HOSPITAL_COMMUNITY)
Admission: RE | Admit: 2022-05-11 | Discharge: 2022-05-11 | Disposition: A | Discharge status: Home / Self Care | Payer: No Typology Code available for payment source | Source: Ambulatory Visit | Attending: Internal Medicine | Admitting: Internal Medicine

## 2022-05-11 ENCOUNTER — Other Ambulatory Visit (HOSPITAL_COMMUNITY): Payer: Self-pay | Admitting: Internal Medicine

## 2022-05-11 ENCOUNTER — Ambulatory Visit: Payer: No Typology Code available for payment source | Admitting: Family

## 2022-05-11 DIAGNOSIS — I493 Ventricular premature depolarization: Secondary | ICD-10-CM

## 2022-05-14 ENCOUNTER — Telehealth (HOSPITAL_COMMUNITY): Payer: Self-pay | Admitting: *Deleted

## 2022-05-14 DIAGNOSIS — I5022 Chronic systolic (congestive) heart failure: Secondary | ICD-10-CM

## 2022-05-14 MED ORDER — SPIRONOLACTONE 25 MG PO TABS: 25.0000 mg | ORAL_TABLET | Freq: Every day | ORAL | 1 refills | Status: DC

## 2022-05-14 NOTE — Telephone Encounter (Signed)
Pt's wife aware, agreeable, and verbalized understanding, pt was order Zio 11/10, she states it has not arrived yet, will f/u with company, advised if they do not receive by Wed AM to call us back.

## 2022-05-14 NOTE — Telephone Encounter (Signed)
-----   Message from Dolores Patty, MD sent at 05/10/2022  4:26 PM EST ----- Hey. Two things. I didn't realize he was on spiro already. Let's keep dose at 25 daily. Also he has frequent PVCs on previous ECG. Can we put 14-d zio on him?  Thank you

## 2022-05-15 ENCOUNTER — Other Ambulatory Visit
Admission: RE | Admit: 2022-05-15 | Discharge: 2022-05-15 | Disposition: A | Discharge status: Home / Self Care | Payer: Medicare HMO | Attending: Internal Medicine | Admitting: Internal Medicine

## 2022-05-15 DIAGNOSIS — I5022 Chronic systolic (congestive) heart failure: Secondary | ICD-10-CM | POA: Insufficient documentation

## 2022-05-15 LAB — BRAIN NATRIURETIC PEPTIDE: B Natriuretic Peptide: 509.1 pg/mL — ABNORMAL HIGH (ref 0.0–100.0)

## 2022-05-15 LAB — MULTIPLE MYELOMA PANEL, SERUM
Albumin SerPl Elph-Mcnc: 3.5 g/dL (ref 2.9–4.4)
Albumin/Glob SerPl: 1.3 (ref 0.7–1.7)
Alpha 1: 0.3 g/dL (ref 0.0–0.4)
Alpha2 Glob SerPl Elph-Mcnc: 0.6 g/dL (ref 0.4–1.0)
B-Globulin SerPl Elph-Mcnc: 0.9 g/dL (ref 0.7–1.3)
Gamma Glob SerPl Elph-Mcnc: 1 g/dL (ref 0.4–1.8)
Globulin, Total: 2.8 g/dL (ref 2.2–3.9)
IgA: 166 mg/dL (ref 61–437)
IgG (Immunoglobin G), Serum: 1145 mg/dL (ref 603–1613)
IgM (Immunoglobulin M), Srm: 65 mg/dL (ref 20–172)
Total Protein ELP: 6.3 g/dL (ref 6.0–8.5)

## 2022-05-15 LAB — BASIC METABOLIC PANEL
Anion gap: 9 (ref 5–15)
BUN: 16 mg/dL (ref 8–23)
CO2: 26 mmol/L (ref 22–32)
Calcium: 9.4 mg/dL (ref 8.9–10.3)
Chloride: 104 mmol/L (ref 98–111)
Creatinine, Ser: 1.53 mg/dL — ABNORMAL HIGH (ref 0.61–1.24)
GFR, Estimated: 49 mL/min — ABNORMAL LOW (ref >60)
Glucose, Bld: 139 mg/dL — ABNORMAL HIGH (ref 70–99)
Potassium: 3.4 mmol/L — ABNORMAL LOW (ref 3.5–5.1)
Sodium: 139 mmol/L (ref 135–145)

## 2022-05-16 NOTE — Telephone Encounter (Signed)
Notified patient that his ZIO should arrive tomorrow and was instructed to wait till his cardiac MRI on Tuesday then he can bring monitor by our office to put on if he would prefer.   Erik Black, NT

## 2022-05-21 ENCOUNTER — Telehealth (HOSPITAL_COMMUNITY): Payer: Self-pay | Admitting: *Deleted

## 2022-05-21 NOTE — Telephone Encounter (Signed)
[employer] auth# [id number]

## 2022-05-22 ENCOUNTER — Telehealth (HOSPITAL_COMMUNITY): Payer: Self-pay | Admitting: Emergency Medicine

## 2022-05-22 NOTE — Telephone Encounter (Signed)
Reaching out to patient to offer assistance regarding upcoming cardiac imaging study; pt verbalizes understanding of appt date/time, parking situation and where to check in, pre-test NPO status and medications ordered, and verified current allergies; name and call back number provided for further questions should they arise Raneshia Derick RN Navigator Cardiac Imaging [facility] [phone] office [phone] cell 

## 2022-05-23 ENCOUNTER — Ambulatory Visit
Admission: RE | Admit: 2022-05-23 | Discharge: 2022-05-23 | Disposition: A | Discharge status: Home / Self Care | Payer: Medicare HMO | Source: Ambulatory Visit | Attending: Internal Medicine | Admitting: Internal Medicine

## 2022-05-23 ENCOUNTER — Encounter (HOSPITAL_COMMUNITY): Payer: Self-pay | Admitting: Surgery

## 2022-05-23 ENCOUNTER — Other Ambulatory Visit: Payer: Self-pay | Admitting: Internal Medicine

## 2022-05-23 DIAGNOSIS — I428 Other cardiomyopathies: Secondary | ICD-10-CM | POA: Insufficient documentation

## 2022-05-23 MED ORDER — GADOBUTROL 1 MMOL/ML IV SOLN
16.0000 mL | Freq: Once | INTRAVENOUS | Status: AC | PRN
  Administered 2022-05-23: 16 mL via INTRAVENOUS

## 2022-06-01 ENCOUNTER — Inpatient Hospital Stay
Admission: EM | Admit: 2022-06-01 | Discharge: 2022-06-04 | DRG: 291 | Disposition: A | Discharge status: Home / Self Care | Payer: Medicare HMO | Attending: Internal Medicine | Admitting: Internal Medicine

## 2022-06-01 ENCOUNTER — Emergency Department: Payer: Medicare HMO

## 2022-06-01 ENCOUNTER — Inpatient Hospital Stay: Payer: Medicare HMO

## 2022-06-01 ENCOUNTER — Encounter: Payer: Self-pay | Admitting: Emergency Medicine

## 2022-06-01 ENCOUNTER — Other Ambulatory Visit: Payer: Self-pay

## 2022-06-01 DIAGNOSIS — I10 Essential (primary) hypertension: Secondary | ICD-10-CM | POA: Diagnosis not present

## 2022-06-01 DIAGNOSIS — N1831 Chronic kidney disease, stage 3a: Secondary | ICD-10-CM | POA: Diagnosis present

## 2022-06-01 DIAGNOSIS — N183 Chronic kidney disease, stage 3 unspecified: Secondary | ICD-10-CM | POA: Diagnosis not present

## 2022-06-01 DIAGNOSIS — Z79899 Other long term (current) drug therapy: Secondary | ICD-10-CM | POA: Diagnosis not present

## 2022-06-01 DIAGNOSIS — I130 Hypertensive heart and chronic kidney disease with heart failure and stage 1 through stage 4 chronic kidney disease, or unspecified chronic kidney disease: Secondary | ICD-10-CM | POA: Diagnosis present

## 2022-06-01 DIAGNOSIS — K219 Gastro-esophageal reflux disease without esophagitis: Secondary | ICD-10-CM | POA: Diagnosis present

## 2022-06-01 DIAGNOSIS — E7800 Pure hypercholesterolemia, unspecified: Secondary | ICD-10-CM | POA: Diagnosis not present

## 2022-06-01 DIAGNOSIS — E785 Hyperlipidemia, unspecified: Secondary | ICD-10-CM | POA: Diagnosis present

## 2022-06-01 DIAGNOSIS — Z96643 Presence of artificial hip joint, bilateral: Secondary | ICD-10-CM | POA: Diagnosis present

## 2022-06-01 DIAGNOSIS — Z7982 Long term (current) use of aspirin: Secondary | ICD-10-CM | POA: Diagnosis not present

## 2022-06-01 DIAGNOSIS — E1122 Type 2 diabetes mellitus with diabetic chronic kidney disease: Secondary | ICD-10-CM | POA: Diagnosis present

## 2022-06-01 DIAGNOSIS — R7989 Other specified abnormal findings of blood chemistry: Secondary | ICD-10-CM | POA: Diagnosis present

## 2022-06-01 DIAGNOSIS — I5023 Acute on chronic systolic (congestive) heart failure: Secondary | ICD-10-CM | POA: Diagnosis present

## 2022-06-01 DIAGNOSIS — F32A Depression, unspecified: Secondary | ICD-10-CM | POA: Diagnosis present

## 2022-06-01 DIAGNOSIS — Z833 Family history of diabetes mellitus: Secondary | ICD-10-CM | POA: Diagnosis not present

## 2022-06-01 DIAGNOSIS — F418 Other specified anxiety disorders: Secondary | ICD-10-CM | POA: Diagnosis not present

## 2022-06-01 DIAGNOSIS — G4730 Sleep apnea, unspecified: Secondary | ICD-10-CM

## 2022-06-01 DIAGNOSIS — Z1152 Encounter for screening for COVID-19: Secondary | ICD-10-CM

## 2022-06-01 DIAGNOSIS — R079 Chest pain, unspecified: Secondary | ICD-10-CM | POA: Diagnosis present

## 2022-06-01 DIAGNOSIS — F419 Anxiety disorder, unspecified: Secondary | ICD-10-CM | POA: Diagnosis present

## 2022-06-01 DIAGNOSIS — I428 Other cardiomyopathies: Secondary | ICD-10-CM | POA: Diagnosis present

## 2022-06-01 DIAGNOSIS — Z8249 Family history of ischemic heart disease and other diseases of the circulatory system: Secondary | ICD-10-CM

## 2022-06-01 DIAGNOSIS — E1129 Type 2 diabetes mellitus with other diabetic kidney complication: Secondary | ICD-10-CM | POA: Diagnosis present

## 2022-06-01 DIAGNOSIS — I2510 Atherosclerotic heart disease of native coronary artery without angina pectoris: Secondary | ICD-10-CM | POA: Diagnosis present

## 2022-06-01 DIAGNOSIS — G4733 Obstructive sleep apnea (adult) (pediatric): Secondary | ICD-10-CM | POA: Diagnosis present

## 2022-06-01 DIAGNOSIS — Z6839 Body mass index (BMI) 39.0-39.9, adult: Secondary | ICD-10-CM | POA: Diagnosis not present

## 2022-06-01 DIAGNOSIS — Z885 Allergy status to narcotic agent status: Secondary | ICD-10-CM | POA: Diagnosis not present

## 2022-06-01 DIAGNOSIS — I5043 Acute on chronic combined systolic (congestive) and diastolic (congestive) heart failure: Secondary | ICD-10-CM | POA: Diagnosis present

## 2022-06-01 DIAGNOSIS — Z87891 Personal history of nicotine dependence: Secondary | ICD-10-CM

## 2022-06-01 DIAGNOSIS — E876 Hypokalemia: Secondary | ICD-10-CM | POA: Diagnosis present

## 2022-06-01 DIAGNOSIS — I509 Heart failure, unspecified: Principal | ICD-10-CM

## 2022-06-01 DIAGNOSIS — I25118 Atherosclerotic heart disease of native coronary artery with other forms of angina pectoris: Secondary | ICD-10-CM | POA: Diagnosis present

## 2022-06-01 DIAGNOSIS — Z888 Allergy status to other drugs, medicaments and biological substances status: Secondary | ICD-10-CM

## 2022-06-01 DIAGNOSIS — I493 Ventricular premature depolarization: Secondary | ICD-10-CM | POA: Diagnosis present

## 2022-06-01 HISTORY — DX: Acute on chronic systolic (congestive) heart failure: I50.23

## 2022-06-01 HISTORY — DX: Other specified abnormal findings of blood chemistry: R79.89

## 2022-06-01 LAB — TROPONIN I (HIGH SENSITIVITY)
Troponin I (High Sensitivity): 14 ng/L (ref <18)
Troponin I (High Sensitivity): 16 ng/L (ref <18)

## 2022-06-01 LAB — COMPREHENSIVE METABOLIC PANEL
ALT: 11 U/L (ref 0–44)
AST: 16 U/L (ref 15–41)
Albumin: 3.8 g/dL (ref 3.5–5.0)
Alkaline Phosphatase: 113 U/L (ref 38–126)
Anion gap: 8 (ref 5–15)
BUN: 15 mg/dL (ref 8–23)
CO2: 28 mmol/L (ref 22–32)
Calcium: 9.2 mg/dL (ref 8.9–10.3)
Chloride: 105 mmol/L (ref 98–111)
Creatinine, Ser: 1.37 mg/dL — ABNORMAL HIGH (ref 0.61–1.24)
GFR, Estimated: 56 mL/min — ABNORMAL LOW (ref >60)
Glucose, Bld: 162 mg/dL — ABNORMAL HIGH (ref 70–99)
Potassium: 3.4 mmol/L — ABNORMAL LOW (ref 3.5–5.1)
Sodium: 141 mmol/L (ref 135–145)
Total Bilirubin: 1.5 mg/dL — ABNORMAL HIGH (ref 0.3–1.2)
Total Protein: 7.1 g/dL (ref 6.5–8.1)

## 2022-06-01 LAB — CBG MONITORING, ED
Glucose-Capillary: 172 mg/dL — ABNORMAL HIGH (ref 70–99)
Glucose-Capillary: 198 mg/dL — ABNORMAL HIGH (ref 70–99)
Glucose-Capillary: 122 mg/dL — ABNORMAL HIGH (ref 70–99)
Glucose-Capillary: 148 mg/dL — ABNORMAL HIGH (ref 70–99)

## 2022-06-01 LAB — MAGNESIUM: Magnesium: 2.1 mg/dL (ref 1.7–2.4)

## 2022-06-01 LAB — CBC
HCT: 43.3 % (ref 39.0–52.0)
Hemoglobin: 14 g/dL (ref 13.0–17.0)
MCH: 28.1 pg (ref 26.0–34.0)
MCHC: 32.3 g/dL (ref 30.0–36.0)
MCV: 86.9 fL (ref 80.0–100.0)
Platelets: 132 10*3/uL — ABNORMAL LOW (ref 150–400)
RBC: 4.98 MIL/uL (ref 4.22–5.81)
RDW: 13.8 % (ref 11.5–15.5)
WBC: 8.6 10*3/uL (ref 4.0–10.5)
nRBC: 0 % (ref 0.0–0.2)

## 2022-06-01 LAB — D-DIMER, QUANTITATIVE: D-Dimer, Quant: 0.75 ug/mL-FEU — ABNORMAL HIGH (ref 0.00–0.50)

## 2022-06-01 LAB — BRAIN NATRIURETIC PEPTIDE: B Natriuretic Peptide: 2025.3 pg/mL — ABNORMAL HIGH (ref 0.0–100.0)

## 2022-06-01 MED ORDER — HYDRALAZINE HCL 20 MG/ML IJ SOLN
5.0000 mg | INTRAMUSCULAR | Status: DC | PRN
  Filled 2022-06-01: qty 1

## 2022-06-01 MED ORDER — ACETAMINOPHEN 325 MG PO TABS: 650.0000 mg | ORAL_TABLET | Freq: Four times a day (QID) | ORAL | Status: DC | PRN

## 2022-06-01 MED ORDER — SACUBITRIL-VALSARTAN 97-103 MG PO TABS
1.0000 | ORAL_TABLET | Freq: Two times a day (BID) | ORAL | Status: DC
  Administered 2022-06-01 – 2022-06-04 (×7): 1 via ORAL
  Filled 2022-06-01 (×7): qty 1

## 2022-06-01 MED ORDER — ONDANSETRON HCL 4 MG/2ML IJ SOLN: 4.0000 mg | Freq: Three times a day (TID) | INTRAMUSCULAR | Status: DC | PRN

## 2022-06-01 MED ORDER — TIMOLOL MALEATE 0.5 % OP SOLN
1.0000 [drp] | Freq: Every day | OPHTHALMIC | Status: DC
  Administered 2022-06-01 – 2022-06-04 (×4): 1 [drp] via OPHTHALMIC
  Filled 2022-06-01: qty 5

## 2022-06-01 MED ORDER — IOHEXOL 350 MG/ML SOLN
100.0000 mL | Freq: Once | INTRAVENOUS | Status: AC | PRN
  Administered 2022-06-01: 75 mL via INTRAVENOUS

## 2022-06-01 MED ORDER — ASPIRIN 81 MG PO TBEC
81.0000 mg | DELAYED_RELEASE_TABLET | Freq: Two times a day (BID) | ORAL | Status: DC
  Administered 2022-06-01 – 2022-06-04 (×6): 81 mg via ORAL
  Filled 2022-06-01 (×6): qty 1

## 2022-06-01 MED ORDER — FENTANYL CITRATE PF 50 MCG/ML IJ SOSY
25.0000 ug | PREFILLED_SYRINGE | INTRAMUSCULAR | Status: DC | PRN
  Administered 2022-06-01: 25 ug via INTRAVENOUS
  Filled 2022-06-01: qty 1

## 2022-06-01 MED ORDER — METOLAZONE 2.5 MG PO TABS
2.5000 mg | ORAL_TABLET | Freq: Once | ORAL | Status: AC
  Administered 2022-06-01: 2.5 mg via ORAL
  Filled 2022-06-01: qty 1

## 2022-06-01 MED ORDER — DULOXETINE HCL 30 MG PO CPEP
60.0000 mg | ORAL_CAPSULE | Freq: Every day | ORAL | Status: DC
  Administered 2022-06-01 – 2022-06-04 (×4): 60 mg via ORAL
  Filled 2022-06-01: qty 2
  Filled 2022-06-01 (×2): qty 1
  Filled 2022-06-01: qty 2

## 2022-06-01 MED ORDER — LATANOPROST 0.005 % OP SOLN
1.0000 [drp] | Freq: Every day | OPHTHALMIC | Status: DC
  Administered 2022-06-01 – 2022-06-03 (×3): 1 [drp] via OPHTHALMIC
  Filled 2022-06-01: qty 2.5

## 2022-06-01 MED ORDER — POTASSIUM CHLORIDE CRYS ER 20 MEQ PO TBCR
60.0000 meq | EXTENDED_RELEASE_TABLET | Freq: Once | ORAL | Status: AC
  Administered 2022-06-01: 60 meq via ORAL
  Filled 2022-06-01: qty 3

## 2022-06-01 MED ORDER — MOMETASONE FURO-FORMOTEROL FUM 100-5 MCG/ACT IN AERO
2.0000 | INHALATION_SPRAY | Freq: Two times a day (BID) | RESPIRATORY_TRACT | Status: DC
  Administered 2022-06-01 – 2022-06-04 (×5): 2 via RESPIRATORY_TRACT
  Filled 2022-06-01: qty 8.8

## 2022-06-01 MED ORDER — HYDRALAZINE HCL 20 MG/ML IJ SOLN
10.0000 mg | Freq: Once | INTRAMUSCULAR | Status: AC
  Administered 2022-06-01: 10 mg via INTRAVENOUS

## 2022-06-01 MED ORDER — INSULIN ASPART 100 UNIT/ML IJ SOLN: 0.0000 [IU] | Freq: Every day | INTRAMUSCULAR | Status: DC

## 2022-06-01 MED ORDER — OXCARBAZEPINE 150 MG PO TABS
150.0000 mg | ORAL_TABLET | Freq: Two times a day (BID) | ORAL | Status: DC
  Administered 2022-06-01 – 2022-06-04 (×7): 150 mg via ORAL
  Filled 2022-06-01 (×7): qty 1

## 2022-06-01 MED ORDER — FUROSEMIDE 10 MG/ML IJ SOLN
80.0000 mg | Freq: Once | INTRAMUSCULAR | Status: AC
  Administered 2022-06-01: 80 mg via INTRAVENOUS
  Filled 2022-06-01: qty 8

## 2022-06-01 MED ORDER — PANTOPRAZOLE SODIUM 40 MG PO TBEC
40.0000 mg | DELAYED_RELEASE_TABLET | Freq: Every day | ORAL | Status: DC
  Administered 2022-06-01 – 2022-06-04 (×4): 40 mg via ORAL
  Filled 2022-06-01 (×4): qty 1

## 2022-06-01 MED ORDER — POTASSIUM CHLORIDE CRYS ER 20 MEQ PO TBCR
40.0000 meq | EXTENDED_RELEASE_TABLET | ORAL | Status: AC
  Administered 2022-06-01: 40 meq via ORAL
  Filled 2022-06-01 (×2): qty 2

## 2022-06-01 MED ORDER — OMEGA-3-ACID ETHYL ESTERS 1 G PO CAPS
1000.0000 mg | ORAL_CAPSULE | Freq: Three times a day (TID) | ORAL | Status: DC
  Administered 2022-06-01 – 2022-06-04 (×7): 1000 mg via ORAL
  Filled 2022-06-01 (×8): qty 1

## 2022-06-01 MED ORDER — SPIRONOLACTONE 25 MG PO TABS
25.0000 mg | ORAL_TABLET | Freq: Every day | ORAL | Status: DC
  Administered 2022-06-01 – 2022-06-04 (×4): 25 mg via ORAL
  Filled 2022-06-01 (×4): qty 1

## 2022-06-01 MED ORDER — UMECLIDINIUM BROMIDE 62.5 MCG/ACT IN AEPB
1.0000 | INHALATION_SPRAY | Freq: Every day | RESPIRATORY_TRACT | Status: DC
  Administered 2022-06-01 – 2022-06-04 (×4): 1 via RESPIRATORY_TRACT
  Filled 2022-06-01: qty 7

## 2022-06-01 MED ORDER — ENOXAPARIN SODIUM 80 MG/0.8ML IJ SOSY
0.5000 mg/kg | PREFILLED_SYRINGE | INTRAMUSCULAR | Status: DC
  Administered 2022-06-01 – 2022-06-03 (×3): 65 mg via SUBCUTANEOUS
  Filled 2022-06-01: qty 0.65
  Filled 2022-06-01 (×2): qty 0.8

## 2022-06-01 MED ORDER — METOLAZONE 2.5 MG PO TABS
2.5000 mg | ORAL_TABLET | Freq: Every day | ORAL | Status: DC
  Administered 2022-06-02 – 2022-06-03 (×2): 2.5 mg via ORAL
  Filled 2022-06-01 (×3): qty 1

## 2022-06-01 MED ORDER — BRIMONIDINE TARTRATE 0.2 % OP SOLN
1.0000 [drp] | Freq: Three times a day (TID) | OPHTHALMIC | Status: DC
  Administered 2022-06-01 – 2022-06-04 (×9): 1 [drp] via OPHTHALMIC
  Filled 2022-06-01: qty 5

## 2022-06-01 MED ORDER — NITROGLYCERIN 0.4 MG SL SUBL: 0.4000 mg | SUBLINGUAL_TABLET | SUBLINGUAL | Status: DC | PRN

## 2022-06-01 MED ORDER — CARVEDILOL 25 MG PO TABS
25.0000 mg | ORAL_TABLET | Freq: Two times a day (BID) | ORAL | Status: DC
  Administered 2022-06-01 – 2022-06-04 (×7): 25 mg via ORAL
  Filled 2022-06-01: qty 4
  Filled 2022-06-01 (×2): qty 1
  Filled 2022-06-01: qty 4
  Filled 2022-06-01: qty 1
  Filled 2022-06-01: qty 4
  Filled 2022-06-01: qty 1

## 2022-06-01 MED ORDER — MORPHINE SULFATE (PF) 2 MG/ML IV SOLN: 1.0000 mg | INTRAVENOUS | Status: DC | PRN

## 2022-06-01 MED ORDER — VITAMIN D 25 MCG (1000 UNIT) PO TABS
2000.0000 [IU] | ORAL_TABLET | Freq: Every day | ORAL | Status: DC
  Administered 2022-06-01 – 2022-06-04 (×4): 2000 [IU] via ORAL
  Filled 2022-06-01 (×4): qty 2

## 2022-06-01 MED ORDER — ALBUTEROL SULFATE (2.5 MG/3ML) 0.083% IN NEBU: 2.5000 mg | INHALATION_SOLUTION | RESPIRATORY_TRACT | Status: DC | PRN

## 2022-06-01 MED ORDER — DM-GUAIFENESIN ER 30-600 MG PO TB12: 1.0000 | ORAL_TABLET | Freq: Two times a day (BID) | ORAL | Status: DC | PRN

## 2022-06-01 MED ORDER — ATORVASTATIN CALCIUM 20 MG PO TABS
40.0000 mg | ORAL_TABLET | Freq: Every day | ORAL | Status: DC
  Administered 2022-06-01 – 2022-06-03 (×3): 40 mg via ORAL
  Filled 2022-06-01 (×3): qty 2

## 2022-06-01 MED ORDER — ADULT MULTIVITAMIN W/MINERALS CH
1.0000 | ORAL_TABLET | Freq: Every day | ORAL | Status: DC
  Administered 2022-06-01 – 2022-06-04 (×4): 1 via ORAL
  Filled 2022-06-01 (×4): qty 1

## 2022-06-01 MED ORDER — INSULIN ASPART 100 UNIT/ML IJ SOLN
0.0000 [IU] | Freq: Three times a day (TID) | INTRAMUSCULAR | Status: DC
  Administered 2022-06-01: 2 [IU] via SUBCUTANEOUS
  Administered 2022-06-01 (×2): 1 [IU] via SUBCUTANEOUS
  Administered 2022-06-02: 2 [IU] via SUBCUTANEOUS
  Administered 2022-06-02 – 2022-06-03 (×3): 1 [IU] via SUBCUTANEOUS
  Administered 2022-06-03 – 2022-06-04 (×2): 2 [IU] via SUBCUTANEOUS
  Filled 2022-06-01 (×8): qty 1

## 2022-06-01 MED ORDER — BUDESON-GLYCOPYRROL-FORMOTEROL 160-9-4.8 MCG/ACT IN AERO: 1.0000 | INHALATION_SPRAY | Freq: Every day | RESPIRATORY_TRACT | Status: DC

## 2022-06-01 NOTE — Progress Notes (Signed)
.  vest  Attempted [place] clip reading x3--each time read out as "low quality".  dDr. Benshimon made aware.

## 2022-06-01 NOTE — Consult Note (Signed)
   Heart Failure Nurse Navigator Note  HFrEF 30-35%   Patient presented to the emergency room with complaints of worsening shortness of breath, chest discomfort and weight gain of 4 pounds overnight.  BNP 2075.  Chest x-ray revealed moderate pulmonary edema.  Comorbidities:  Coronary artery disease Hypertension Hyperlipidemia Diabetes Obstructive sleep apnea on CPAP Chronic kidney disease Depression/anxiety Obesity  Medications:  Aspirin 81 mg daily Atorvastatin 40 mg daily Carvedilol 25 mg twice a day Furosemide 80 mg IV once Hydralazine 10 mg x 1 Metolazone 2.5 mg daily  Labs:  Sodium 141, potassium 3.4, chloride 105, CO2 28, BUN 15, creatinine 1.37 estimated GFR 56, magnesium 2.1, hemoglobin 14, hematocrit 43.3 Weight 132 kg Blood pressure 142/107   Initial meeting with patient in the ED, he is lying in bed in no acute distress currently on O2 per nasal cannula.  Wife, Para March is present at the bedside.  States since Tuesday he had noted increasing shortness of breath this morning when he got up and weighed himself and was up 4 pounds and felt that his shortness of breath was worse he did presented to the emergency room.  Discussed diet and fluids, wife states that they are staying well within their restrictions.  Denied any indiscretions with diet over the Thanksgiving holiday.  Patient states since the day after Thanksgiving he has been stressed and upset and noted that his blood pressure was running 170/104.  He has been compliant with his medications.  He was seen by Dr. Gala Romney and Reds vest readings were completed, 3 times tried in the bed with the head of the bed elevated those readings were low quality and attempted 1 more time with him sitting up in the chair and also got low quality with that reading.  Dr. Gala Romney made aware.  He had no further questions.  Per Dr. Prescott Gum instructions he will be seen next week in the clinic.  Tresa Endo RN  CHFN

## 2022-06-01 NOTE — Progress Notes (Signed)
PHARMACIST - PHYSICIAN COMMUNICATION  CONCERNING:  Enoxaparin (Lovenox) for DVT Prophylaxis    RECOMMENDATION: Patient was prescribed enoxaprin 40mg  q24 hours for VTE prophylaxis.   Filed Weights   06/01/22 0729  Weight: 132 kg (291 lb)    Body mass index is 39.47 kg/m.  Estimated Creatinine Clearance: 71.5 mL/min (A) (by C-G formula based on SCr of 1.37 mg/dL (H)).   Based on [facility] policy patient is candidate for enoxaparin 0.5mg /kg TBW SQ every 24 hours based on BMI being >30.   DESCRIPTION: Pharmacy has adjusted enoxaparin dose per [facility] policy.  Patient is now receiving enoxaparin 65 mg every 24 hours    [facility], PharmD Clinical Pharmacist  06/01/2022 9:47 AM

## 2022-06-01 NOTE — ED Notes (Signed)
Pt finished with his breakfast tray in last 5 minutes. Since wasn't able to check blood sugar before pt's breaskfast will edit coverage with best judgement.

## 2022-06-01 NOTE — H&P (Signed)
History and Physical    Erik Black [id number] DOB: [birth year] DOA: 06/01/2022  Referring MD/NP/PA:   PCP: [place]   Patient coming from:  The patient is coming from home.  At baseline, pt is independent for most of ADL.        Chief Complaint: SOB and chest pain  HPI: Erik Black is a 71 y.o. male with medical history significant of sCHF with EF of 30 to 35%, CAD, HTN, HLD, DM, OSA on CPAP, CKD-3a, depression with anxiety, obesity, who presents with shortness breath and chest pain.    Patient has shortness of breath for more than 2 days, which has been progressively worsening.  No cough, fever or chills.  He has 5 pounds weight gain in the past several days.  Patient also reports chest pain, which is located in the left side of chest, radiating across to the lower front chest, 8 out of 10 severity, sharp, not pleuritic, not aggravated by deep breath.  Patient also reports some electric pain in left arm, right shoulder, left upper thigh sometimes.  No unilateral weakness, facial droop or slurred speech.  Data reviewed independently and ED Course: pt was found to have BNP 2075, troponin 14, potassium 3.4, stable renal function, temperature normal, blood pressure 169/105, heart rate 86, RR 19, oxygen saturation 95% on room air.  Chest x-ray showed moderate pulmonary edema.  Patient is admitted to telemetry bed as inpatient.  Dr. Fletcher Anon of cardiology is consulted   EKG: I have personally reviewed.  Sinus rhythm, QTc 473, low voltage, poor R wave progression, PVC.   Review of Systems:   General: no fevers, chills, has body weight gain, has poor appetite, has fatigue HEENT: no blurry vision, hearing changes or sore throat Respiratory: has dyspnea, no coughing, wheezing CV: has chest pain, no palpitations GI: no nausea, vomiting, abdominal pain, diarrhea, constipation GU: no dysuria, burning on urination, increased urinary frequency, hematuria  Ext: has trace leg  edema Neuro: no unilateral weakness, numbness, or tingling, no vision change or hearing loss Skin: no rash, no skin tear. MSK: No muscle spasm, no deformity, no limitation of range of movement in spin Heme: No easy bruising.  Travel history: No recent long distant travel.   Allergy:  Allergies  Allergen Reactions   Empagliflozin Other (See Comments)    Yeast infection  Other Reaction(s): Bleeding skin   Methadone    Oxycodone Itching    Hands started peeling and "could not swallow"   Lisinopril Cough    Past Medical History:  Diagnosis Date   Anxiety    Arthritis    CHF (congestive heart failure) (HCC)    Depression    Diabetes mellitus without complication (HCC)    GERD (gastroesophageal reflux disease)    Glaucoma    both eyes   Hypertension    Sleep apnea    uses Cpap nightly    Past Surgical History:  Procedure Laterality Date   CARPAL TUNNEL RELEASE Bilateral    COLONOSCOPY     EYE SURGERY Bilateral    cataract surgery with lens implants   KNEE ARTHROPLASTY Right    KNEE ARTHROPLASTY Left    KNEE ARTHROSCOPY Right    KNEE ARTHROSCOPY Left    MULTIPLE TOOTH EXTRACTIONS     RIGHT/LEFT HEART CATH AND CORONARY ANGIOGRAPHY Bilateral 05/03/2022   Procedure: RIGHT/LEFT HEART CATH AND CORONARY ANGIOGRAPHY;  Surgeon: Leonie Man, MD;  Location: [facility];  Service: Cardiovascular;  Laterality: Bilateral;  ROTATOR CUFF REPAIR Right    ROTATOR CUFF REPAIR W/ DISTAL CLAVICLE EXCISION Left    TOTAL HIP ARTHROPLASTY Right 05/11/2019   Procedure: RIGHT TOTAL HIP ARTHROPLASTY ANTERIOR APPROACH;  Surgeon: Leandrew Koyanagi, MD;  Location: [place];  Service: Orthopedics;  Laterality: Right;   TOTAL HIP ARTHROPLASTY Left 11/23/2019   TOTAL HIP ARTHROPLASTY Left 11/23/2019   Procedure: LEFT TOTAL HIP ARTHROPLASTY ANTERIOR APPROACH;  Surgeon: Leandrew Koyanagi, MD;  Location: [place];  Service: Orthopedics;  Laterality: Left;   VASECTOMY      Social History:  reports that  he quit smoking about 3 years ago. His smoking use included cigarettes. He smoked an average of .1 packs per day. He has never used smokeless tobacco. He reports that he does not currently use alcohol. He reports that he does not currently use drugs.  Family History:  Family History  Problem Relation Age of Onset   Hypertension Mother    Diabetes Mother    Heart disease Mother    Heart disease Father    Heart disease Brother      Prior to Admission medications   Medication Sig Start Date End Date Taking? Authorizing Provider  aspirin EC 81 MG tablet Take 1 tablet (81 mg total) by mouth 2 (two) times daily. 05/11/19   Leandrew Koyanagi, MD  atorvastatin (LIPITOR) 40 MG tablet Take 1 tablet (40 mg total) by mouth at bedtime. 05/08/22   Kate Sable, MD  BREZTRI AEROSPHERE 160-9-4.8 MCG/ACT AERO Inhale into the lungs. 04/17/22   [provider]  brimonidine (ALPHAGAN) 0.2 % ophthalmic solution Place 1 drop into both eyes 3 (three) times daily.    [provider]  Brinzolamide-Brimonidine [facility]) 1-0.2 % SUSP  01/05/22   [provider]  carvedilol (COREG) 25 MG tablet Take 1 tablet (25 mg total) by mouth 2 (two) times daily. 05/07/22   Lorella Nimrod, MD  Cholecalciferol (VITAMIN D) 50 MCG (2000 UT) tablet Take 4,000 Units by mouth daily.  03/03/19   [provider]  DULoxetine (CYMBALTA) 60 MG capsule Take 60 mg by mouth daily. 01/07/19   [provider]  latanoprost (XALATAN) 0.005 % ophthalmic solution Place 1 drop into both eyes at bedtime.    [provider]  Multiple Vitamins-Minerals (MULTIVITAMIN WITH MINERALS) tablet Take 1 tablet by mouth daily.    [provider]  Omega-3 1000 MG CAPS Take 2,000 mg by mouth 3 (three) times daily after meals.    [provider]  Omega-3 Fatty Acids (FISH OIL) 1000 MG CAPS Take 1 capsule by mouth in the morning, at noon, and at bedtime. 07/31/21   [provider]  omeprazole  (PRILOSEC) 40 MG capsule Take 20 mg by mouth 2 (two) times daily.    [provider]  OXcarbazepine (TRILEPTAL) 150 MG tablet Take 150 mg by mouth 2 (two) times daily.    [provider]  potassium chloride SA (KLOR-CON M) 20 MEQ tablet Take 1 tablet (20 mEq total) by mouth daily. 05/07/22   Lorella Nimrod, MD  sacubitril-valsartan (ENTRESTO) 97-103 MG Take 1 tablet by mouth 2 (two) times daily. 05/07/22   Lorella Nimrod, MD  Semaglutide,0.25 or 0.5MG /DOS, 2 MG/3ML SOPN Inject 0.25 mg into the skin once a week. 04/04/22   Alisa Graff, FNP  spironolactone (ALDACTONE) 25 MG tablet Take 1 tablet (25 mg total) by mouth daily. 05/14/22   Bensimhon, Shaune Pascal, MD  timolol (TIMOPTIC-XR) 0.5 % ophthalmic gel-forming Place 1 drop  into both eyes daily.    [provider]  torsemide (DEMADEX) 20 MG tablet Take 1 tablet (20 mg total) by mouth daily. 05/08/22   Lorella Nimrod, MD    Physical Exam: Vitals:   06/01/22 1657 06/01/22 1700 06/01/22 1800 06/01/22 1836  BP:   (!) 147/90   Pulse: 91  89   Resp:   (!) 23   Temp:    98 F (36.7 C)  TempSrc:    Oral  SpO2: 96% 97% 96%   Weight:      Height:       General: Not in acute distress HEENT:       Eyes: PERRL, EOMI, no scleral icterus.       ENT: No discharge from the ears and nose, no pharynx injection, no tonsillar enlargement.        Neck: positive JVD, no bruit, no mass felt. Heme: No neck lymph node enlargement. Cardiac: S1/S2, RRR, No murmurs, No gallops or rubs. Respiratory: Has fine crackles bilaterally GI: Soft, nondistended, nontender, no rebound pain, no organomegaly, BS present. GU: No hematuria Ext: Has trace leg edema bilaterally. 1+DP/PT pulse bilaterally. Musculoskeletal: No joint deformities, No joint redness or warmth, no limitation of ROM in spin. Skin: No rashes.  Neuro: Alert, oriented X3, cranial nerves II-XII grossly intact, moves all extremities normally. Psych: Patient is not psychotic, no  suicidal or hemocidal ideation.  Labs on Admission: I have personally reviewed following labs and imaging studies  CBC: Recent Labs  Lab 06/01/22 0805  WBC 8.6  HGB 14.0  HCT 43.3  MCV 86.9  PLT [id number]*   Basic Metabolic Panel: Recent Labs  Lab 06/01/22 0739 06/01/22 0805  NA  --  141  K  --  3.4*  CL  --  105  CO2  --  28  GLUCOSE  --  162*  BUN  --  15  CREATININE  --  1.37*  CALCIUM  --  9.2  MG 2.1  --    GFR: Estimated Creatinine Clearance: 71.5 mL/min (A) (by C-G formula based on SCr of 1.37 mg/dL (H)). Liver Function Tests: Recent Labs  Lab 06/01/22 0805  AST 16  ALT 11  ALKPHOS 113  BILITOT 1.5*  PROT 7.1  ALBUMIN 3.8   No results for input(s): "LIPASE", "AMYLASE" in the last 168 hours. No results for input(s): "AMMONIA" in the last 168 hours. Coagulation Profile: No results for input(s): "INR", "PROTIME" in the last 168 hours. Cardiac Enzymes: No results for input(s): "CKTOTAL", "CKMB", "CKMBINDEX", "TROPONINI" in the last 168 hours. BNP (last 3 results) No results for input(s): "PROBNP" in the last 8760 hours. HbA1C: No results for input(s): "HGBA1C" in the last 72 hours. CBG: Recent Labs  Lab 06/01/22 1047 06/01/22 1201 06/01/22 1615  GLUCAP 172* 198* 122*   Lipid Profile: No results for input(s): "CHOL", "HDL", "LDLCALC", "TRIG", "CHOLHDL", "LDLDIRECT" in the last 72 hours. Thyroid Function Tests: No results for input(s): "TSH", "T4TOTAL", "FREET4", "T3FREE", "THYROIDAB" in the last 72 hours. Anemia Panel: No results for input(s): "VITAMINB12", "FOLATE", "FERRITIN", "TIBC", "IRON", "RETICCTPCT" in the last 72 hours. Urine analysis:    Component Value Date/Time   COLORURINE YELLOW 11/19/2019 1144   APPEARANCEUR HAZY (A) 11/19/2019 1144   LABSPEC 1.016 11/19/2019 1144   PHURINE 7.0 11/19/2019 1144   GLUCOSEU NEGATIVE 11/19/2019 1144   [place] 11/19/2019 [place] 11/19/2019 [place] 11/19/2019  [place] 11/19/2019 1144  NITRITE NEGATIVE 11/19/2019 1144   LEUKOCYTESUR NEGATIVE 11/19/2019 1144   Sepsis Labs: @LABRCNTIP (procalcitonin:4,lacticidven:4) )No results found for this or any previous visit (from the past 240 hour(s)).   Radiological Exams on Admission: DG Chest 2 View  Result Date: 06/01/2022 CLINICAL DATA:  Chest pain EXAM: CHEST - 2 VIEW COMPARISON:  Radiograph 02/05/2022 FINDINGS: Unchanged cardiomediastinal silhouette. Electronic device overlies the left upper chest. There are diffuse interstitial opacities and lower lung predominant airspace opacities with small pleural effusions. Bibasilar atelectasis. No evidence of pneumothorax. No acute osseous abnormality. Thoracic spondylosis. IMPRESSION: Mild-to-moderate pulmonary edema with small pleural effusions and bibasilar atelectasis. Electronically Signed   By: 04/07/2022 M.D.   On: 06/01/2022 08:01      Assessment/Plan Principal Problem:   Acute on chronic systolic CHF (congestive heart failure) (HCC) Active Problems:   CAD (coronary artery disease)   Chest pain   Type II diabetes mellitus with renal manifestations (HCC)   Hypertension   HLD (hyperlipidemia)   Depression with anxiety   Sleep apnea   Morbid obesity (HCC)   Hypokalemia   Chronic kidney disease, stage 3a (HCC)   Positive D dimer   Assessment and Plan:  Acute on chronic systolic CHF (congestive heart failure) (HCC): Patient has positive JVD, crackles on auscultation, leg edema, elevated BNP 2025, pulmonary edema chest x-ray, clinically consistent with CHF exacerbation.  2D echo on 03/07/2022 showed EF 30-35%.  Dr. 05/07/2022 of cardiology is seeing patient.  -Will admit to tele bed as inpatient -Patient received 80 mg of Lasix in ED - Give additional lasix 80 IV now with metolazone 2.5 x 2 days. Per Dr. Gala Romney - Continue Gala Romney to 97/103 bid - Continue carvedilol  - Increased spiro to 25 daily by Dr. Sherryll Burger -Daily  weights -strict I/O's -Low salt diet -Fluid restriction -Obtain REDs Vest reading  CAD (coronary artery disease) and chest pain: Troponin 14. D-dimer positive 0.75.  Consulted Dr. Gala Romney of cardiology -ASA and lipitor -trend trop -check A1c and FLP -f/u CTA and LE doppler -prn nitro and morphine  Type II diabetes mellitus with renal manifestations (HCC): 2D echo 7.0 recently.  Patient is taking semaglutide at home -SSI  Hypertension -IV hydralazine as needed -Patient is on Coreg, Entresto,  -Patient is also on IV Lasix and metolazone, spironolactone  HLD (hyperlipidemia) -Lipitor  Depression with anxiety -Continue home medications  Sleep apnea -CPAP  Morbid obesity (HCC) -Healthy diet and exercise -Encourage to lose weight  Hypokalemia: Potassium 3.4 -Repleted potassium -Check magnesium level --> 2.1  Chronic kidney disease, stage 3a (HCC): Stable -Follow-up with BMP    DVT ppx: SQ Lovenox  Code Status: Full code per pt and his wife   Family Communication: Yes, patient's  wife at bed side.  Disposition Plan:  Anticipate discharge back to previous environment  Consults called:  Dr. Gala Romney  Admission status and Level of care: Telemetry Cardiac:  as inpt      Dispo: The patient is from: Home              Anticipated d/c is to: Home              Anticipated d/c date is: 2 days              Patient currently is not medically stable to d/c.    Severity of Illness:  The appropriate patient status for this patient is INPATIENT. Inpatient status is judged to be reasonable and necessary in order to provide the required intensity  of service to ensure the patient's safety. The patient's presenting symptoms, physical exam findings, and initial radiographic and laboratory data in the context of their chronic comorbidities is felt to place them at high risk for further clinical deterioration. Furthermore, it is not anticipated that the patient will be medically  stable for discharge from the hospital within 2 midnights of admission.   * I certify that at the point of admission it is my clinical judgment that the patient will require inpatient hospital care spanning beyond 2 midnights from the point of admission due to high intensity of service, high risk for further deterioration and high frequency of surveillance required.*       Date of Service 06/01/2022    Ivor Costa [facility]   If 7PM-7AM, please contact night-coverage [email or web address] 06/01/2022, 6:39 PM

## 2022-06-01 NOTE — ED Triage Notes (Signed)
Pt comes with c/o sob and chest pain. Pt has hx of CHF. Pt states this all few days ago. Pt states wt gain 5lbs since yesterday. Pt states it is getting worse and worse.   Pt has labored breathing and accessory muscle use noted.

## 2022-06-01 NOTE — ED Notes (Signed)
Verbal from provider Clyde Lundborg that he will add on additional pain med soon. Will give once order entered.

## 2022-06-01 NOTE — ED Notes (Signed)
Pt eating dinner

## 2022-06-01 NOTE — ED Notes (Signed)
Purple top not able to be collected in triage. Partial green top was sent to lab @0741 .

## 2022-06-01 NOTE — ED Provider Notes (Signed)
[facility] Provider Note    Event Date/Time   First MD Initiated Contact with Patient 06/01/22 [phone]     (approximate)   History   Shortness of Breath and Chest Pain   HPI  Erik Black is a 71 y.o. male with history of CHF, diabetes, hypertension who presents with complaints of shortness of breath.  Patient reports over the last 2 days had noticed mild increase in shortness of breath and decrease in energy, today he reports 4 pound weight gain.  Reports compliance with his torsemide and spironolactone.  Does see [facility] cardiology, review of records demonstrates the patient was admitted at the beginning of November, spent 8 days in the hospital and was diuresed significantly at that time.     Physical Exam   Triage Vital Signs: ED Triage Vitals  Enc Vitals Group     BP 06/01/22 0725 (!) 169/105     Pulse Rate 06/01/22 0725 86     Resp 06/01/22 0725 19     Temp 06/01/22 0725 97.9 F (36.6 C)     Temp src --      SpO2 06/01/22 0725 95 %     Weight 06/01/22 0729 132 kg (291 lb)     Height 06/01/22 0729 1.829 m (6')     Head Circumference --      Peak Flow --      Pain Score 06/01/22 0722 10     Pain Loc --      Pain Edu? --      Excl. in [place]? --     Most recent vital signs: Vitals:   06/01/22 0725  BP: (!) 169/105  Pulse: 86  Resp: 19  Temp: 97.9 F (36.6 C)  SpO2: 95%     General: Awake, no distress.  CV:  Good peripheral perfusion.  Resp:  Normal effort.  Bibasilar Rales Abd:  No distention.  Other:  Minimal edema bilaterally   ED Results / Procedures / Treatments   Labs (all labs ordered are listed, but only abnormal results are displayed) Labs Reviewed  BRAIN NATRIURETIC PEPTIDE - Abnormal; Notable for the following components:      Result Value   B Natriuretic Peptide 2,025.3 (*)    All other components within normal limits  COMPREHENSIVE METABOLIC PANEL - Abnormal; Notable for the following components:   Potassium  3.4 (*)    Glucose, Bld 162 (*)    Creatinine, Ser 1.37 (*)    Total Bilirubin 1.5 (*)    GFR, Estimated 56 (*)    All other components within normal limits  CBC - Abnormal; Notable for the following components:   Platelets 132 (*)    All other components within normal limits  TROPONIN I (HIGH SENSITIVITY)     EKG     RADIOLOGY Chest x-ray viewed interpreted by me, positive for edema, radiology notes mild to moderate pulmonary edema with small pleural effusions    PROCEDURES:  Critical Care performed:   Procedures   MEDICATIONS ORDERED IN ED: Medications  furosemide (LASIX) injection 80 mg (80 mg Intravenous Given 06/01/22 0922)     IMPRESSION / MDM / ASSESSMENT AND PLAN / ED COURSE  I reviewed the triage vital signs and the nursing notes. Patient's presentation is most consistent with severe exacerbation of chronic illness.  Patient presents with shortness of breath as above.  Differential includes acute on chronic CHF given weight gain, shortness of breath, doubt pneumonia, no significant chest pain to  suggest ACS.  Pending labs including BNP, chest x-ray demonstrates mild to moderate edema  Discussed with Dr. Kirke Corin of cardiology, given BNP over 2000 will admit, have started IV Lasix        FINAL CLINICAL IMPRESSION(S) / ED DIAGNOSES   Final diagnoses:  Acute on chronic congestive heart failure, unspecified heart failure type (HCC)     Rx / DC Orders   ED Discharge Orders     None        Note:  This document was prepared using Dragon voice recognition software and may include unintentional dictation errors.   Jene Every, MD 06/01/22 [phone]

## 2022-06-01 NOTE — ED Notes (Signed)
Pt on call light endorsing SOB. Placed on 2L [place] by NT Shawn. Pt stating he was sleeping, woke up short of breath, and the oxygen is helping. Pt endorsing sleep apnea and states he wears a CPAP at night. Oxygen saturation 97% on 2L [place].

## 2022-06-01 NOTE — ED Notes (Signed)
Provider Jarvis Morgan notified of pt's c/o 7/10 stabbing cp; pt initially stated "I'm fine" before answering with a number; pt's skin dry, resp baseline for today; calmly laying on bed. Wife at bedside.

## 2022-06-01 NOTE — ED Notes (Signed)
Pt assisted by this RN and pt's wife to switch from stretcher to bed; pt steady with minimal assistance.

## 2022-06-01 NOTE — Consult Note (Addendum)
Advanced Heart Failure Team Consult Note   Primary Physician: [facility] PCP-Cardiologist:  Debbe Odea, MD  Reason for Consultation: Acute on chronic systolic CHF  HPI:    Erik Black is seen today for evaluation of acute on chronic systolic CHF at the request of Dr. Clyde Lundborg with [facility]. 71 y.o. male with history of chronic systolic CHF, CAD, HTN, HLD, DM, OSA on CPAP, CKD IIIa, obesity.   Echo 09/23: EF 30-35%.   Admitted 11/23 with acute CHF. Diuresed 22 lb with IV lasix and started on GDMT.   Cath 05/03/22: LAD 60%d LCx 55% prox RCA 30%m   RA 18 RV 68/15 PA 69/39 (41) PCWP 40  Fick 5.6/2.3  Saw Dr. Gala Romney in HF clinic earlier this month. Volume looked good on exam. Cleda Daub added. PVCs on ECG >> Zio ordered. Workup to r/o cardiac amyloid started. Myeloma panel negative.  cMRI: LVEF 25%, moderately reduced RV, mid wall LGE at RV insertion points, findings c/w dilated NICM  Presented to the ED this am with complaints of worsening dyspnea and CP for several days. Says symptoms started after he got very upset because he was scammed when he was buying an RV. BP went up then got very SOB.  Weight up about 4 pounds. Came to ER. BP 169/105. Labs significant for BNP 2025 (509 2 weeks ago), HS troponin 14, Scr 1.37 (near recent baseline). CXR with pulmonary edema w/ small pleural effusions. He was given 80 mg lasix IV and admitted under hospitalist service for a/c CHF. Advanced Heart Failure consulted to assist with management.  Now says he feels much better. Able to lie flat.   Previously smoked a pack per month, quit 09/23.   Has strong family hx of HF - mother, 4 brothers with HF (1 had ICD), father had MI.  Review of Systems: [y] = yes, [ ]  = no   General: Weight gain [ ] ; Weight loss [ ] ; Anorexia [ ] ; Fatigue [ ] ; Fever [ ] ; Chills [ ] ; Weakness [ ]   Cardiac: Chest pain/pressure [ ] ; Resting SOB Cove.Etienne ]; Exertional SOB Cove.Etienne ]; Orthopnea [ y]; Pedal Edema [ ] ;  Palpitations [ ] ; Syncope [ ] ; Presyncope [ ] ; Paroxysmal nocturnal dyspnea[ ]   Pulmonary: Cough [ ] ; Wheezing[ ] ; Hemoptysis[ ] ; Sputum [ ] ; Snoring Cove.Etienne ]  GI: Vomiting[ ] ; Dysphagia[ ] ; Melena[ ] ; Hematochezia [ ] ; Heartburn[ ] ; Abdominal pain [ ] ; Constipation [ ] ; Diarrhea [ ] ; BRBPR [ ]   GU: Hematuria[ ] ; Dysuria [ ] ; Nocturia[ ]   Vascular: Pain in legs with walking [ ] ; Pain in feet with lying flat [ ] ; Non-healing sores [ ] ; Stroke [ ] ; TIA [ ] ; Slurred speech [ ] ;  Neuro: Headaches[ ] ; Vertigo[ ] ; Seizures[ ] ; Paresthesias[ ] ;Blurred vision [ ] ; Diplopia [ ] ; Vision changes [ ]   Ortho/Skin: Arthritis [ y]; Joint pain [ y]; Muscle pain [ ] ; Joint swelling [ ] ; Back Pain [ ] ; Rash [ ]   Psych: Depression[ ] ; Anxiety[ ]   Heme: Bleeding problems [ ] ; Clotting disorders [ ] ; Anemia [ ]   Endocrine: Diabetes [ y]; Thyroid dysfunction[ ]   Home Medications Prior to Admission medications   Medication Sig Start Date End Date Taking? Authorizing Provider  aspirin EC 81 MG tablet Take 1 tablet (81 mg total) by mouth 2 (two) times daily. 05/11/19  Yes Tarry Kos, MD  atorvastatin (LIPITOR) 40 MG tablet Take 1 tablet (40 mg total) by mouth at bedtime. 05/08/22  Yes  Kate Sable, MD  BREZTRI AEROSPHERE 160-9-4.8 MCG/ACT AERO Inhale into the lungs. 04/17/22  Yes [provider]  brimonidine (ALPHAGAN) 0.2 % ophthalmic solution Place 1 drop into both eyes 3 (three) times daily.   Yes [provider]  carvedilol (COREG) 25 MG tablet Take 1 tablet (25 mg total) by mouth 2 (two) times daily. 05/07/22  Yes Lorella Nimrod, MD  Cholecalciferol (VITAMIN D) 50 MCG (2000 UT) tablet Take 4,000 Units by mouth daily.  03/03/19  Yes [provider]  DULoxetine (CYMBALTA) 60 MG capsule Take 60 mg by mouth daily. 01/07/19  Yes [provider]  latanoprost (XALATAN) 0.005 % ophthalmic solution Place 1 drop into both eyes at bedtime.   Yes [provider]  Multiple  Vitamins-Minerals (MULTIVITAMIN WITH MINERALS) tablet Take 1 tablet by mouth daily.   Yes [provider]  Omega-3 1000 MG CAPS Take 2,000 mg by mouth 3 (three) times daily after meals.   Yes [provider]  omeprazole (PRILOSEC) 20 MG capsule Take 20 mg by mouth 2 (two) times daily.   Yes [provider]  OXcarbazepine (TRILEPTAL) 150 MG tablet Take 150 mg by mouth 2 (two) times daily.   Yes [provider]  potassium chloride SA (KLOR-CON M) 20 MEQ tablet Take 1 tablet (20 mEq total) by mouth daily. 05/07/22  Yes Lorella Nimrod, MD  sacubitril-valsartan (ENTRESTO) 97-103 MG Take 1 tablet by mouth 2 (two) times daily. 05/07/22  Yes Lorella Nimrod, MD  spironolactone (ALDACTONE) 25 MG tablet Take 1 tablet (25 mg total) by mouth daily. 05/14/22  Yes Krisa Blattner, Shaune Pascal, MD  timolol (TIMOPTIC-XR) 0.5 % ophthalmic gel-forming Place 1 drop into both eyes daily.   Yes [provider]  torsemide (DEMADEX) 20 MG tablet Take 1 tablet (20 mg total) by mouth daily. 05/08/22  Yes Lorella Nimrod, MD  Brinzolamide-Brimonidine [facility]) 1-0.2 % SUSP Place 1 drop into both eyes daily. Patient not taking: Reported on 06/01/2022 01/05/22   [provider]  Semaglutide,0.25 or 0.5MG /DOS, 2 MG/3ML SOPN Inject 0.25 mg into the skin once a week. 04/04/22   Alisa Graff, FNP    Past Medical History: Past Medical History:  Diagnosis Date   Anxiety    Arthritis    CHF (congestive heart failure) ([place])    Depression    Diabetes mellitus without complication (HCC)    GERD (gastroesophageal reflux disease)    Glaucoma    both eyes   Hypertension    Sleep apnea    uses Cpap nightly    Past Surgical History: Past Surgical History:  Procedure Laterality Date   CARPAL TUNNEL RELEASE Bilateral    COLONOSCOPY     EYE SURGERY Bilateral    cataract surgery with lens implants   KNEE ARTHROPLASTY Right    KNEE ARTHROPLASTY Left    KNEE ARTHROSCOPY Right    KNEE  ARTHROSCOPY Left    MULTIPLE TOOTH EXTRACTIONS     RIGHT/LEFT HEART CATH AND CORONARY ANGIOGRAPHY Bilateral 05/03/2022   Procedure: RIGHT/LEFT HEART CATH AND CORONARY ANGIOGRAPHY;  Surgeon: Leonie Man, MD;  Location: [facility];  Service: Cardiovascular;  Laterality: Bilateral;   ROTATOR CUFF REPAIR Right    ROTATOR CUFF REPAIR W/ DISTAL CLAVICLE EXCISION Left    TOTAL HIP ARTHROPLASTY Right 05/11/2019   Procedure: RIGHT TOTAL HIP ARTHROPLASTY ANTERIOR APPROACH;  Surgeon: Leandrew Koyanagi, MD;  Location: [facility];  Service: Orthopedics;  Laterality: Right;   TOTAL HIP ARTHROPLASTY Left 11/23/2019  TOTAL HIP ARTHROPLASTY Left 11/23/2019   Procedure: LEFT TOTAL HIP ARTHROPLASTY ANTERIOR APPROACH;  Surgeon: Tarry Kos, MD;  Location: [facility];  Service: Orthopedics;  Laterality: Left;   VASECTOMY      Family History: Family History  Problem Relation Age of Onset   Hypertension Mother    Diabetes Mother    Heart disease Mother    Heart disease Father    Heart disease Brother     Social History: Social History   Socioeconomic History   Marital status: Married    Spouse name: [name]   Number of children: 0   Years of education: Not on file   Highest education level: Not on file  Occupational History   Not on file  Tobacco Use   Smoking status: Former    Packs/day: 0.10    Types: Cigarettes    Quit date: 05/26/2019    Years since quitting: 3.0   Smokeless tobacco: Never   Tobacco comments:    smokes a pack a month  Vaping Use   Vaping Use: Never used  Substance and Sexual Activity   Alcohol use: Not Currently   Drug use: Not Currently   Sexual activity: Not on file  Other Topics Concern   Not on file  Social History Narrative   Lives at home with wife [name] and a dog.   Social Determinants of Health   Financial Resource Strain: Not on file  Food Insecurity: No Food Insecurity (06/01/2022)   Hunger Vital Sign    Worried About Running Out of Food in the Last  Year: Never true    Ran Out of Food in the Last Year: Never true  Transportation Needs: No Transportation Needs (06/01/2022)   PRAPARE - Administrator, Civil Service (Medical): No    Lack of Transportation (Non-Medical): No  Physical Activity: Not on file  Stress: Not on file  Social Connections: Not on file    Allergies:  Allergies  Allergen Reactions   Empagliflozin Other (See Comments)    Yeast infection  Other Reaction(s): Bleeding skin   Methadone    Oxycodone Itching    Hands started peeling and "could not swallow"   Lisinopril Cough    Objective:    Vital Signs:   Temp:  [97.9 F (36.6 C)] 97.9 F (36.6 C) (12/01 0725) Pulse Rate:  [79-86] 86 (12/01 1104) Resp:  [16-25] 18 (12/01 1104) BP: (147-169)/(99-109) 149/106 (12/01 1100) SpO2:  [93 %-96 %] 96 % (12/01 1104) Weight:  [132 kg] 132 kg (12/01 0729)    Weight change: Filed Weights   06/01/22 0729  Weight: 132 kg    Intake/Output:   Intake/Output Summary (Last 24 hours) at 06/01/2022 1156 Last data filed at 06/01/2022 1017 Gross per 24 hour  Intake --  Output 350 ml  Net -350 ml      Physical Exam    General:  Well appearing. No resp difficulty HEENT: normal Neck: supple. JVP . Carotids 2+ bilat; no bruits. No lymphadenopathy or thyromegaly appreciated. Cor: PMI nondisplaced. Regular rate & rhythm. No rubs, gallops or murmurs. Lungs: clear Abdomen: soft, nontender, nondistended. No hepatosplenomegaly. No bruits or masses. Good bowel sounds. Extremities: no cyanosis, clubbing, rash, edema Neuro: alert & orientedx3, cranial nerves grossly intact. moves all 4 extremities w/o difficulty. Affect pleasant   Telemetry   Sinus 80s Personally reviewed  EKG    Sinus 76 frequent PVCs Personally reviewed  Labs   Basic Metabolic Panel: Recent Labs  Lab 06/01/22 0739 06/01/22 0805  NA  --  141  K  --  3.4*  CL  --  105  CO2  --  28  GLUCOSE  --  162*  BUN  --  15  CREATININE   --  1.37*  CALCIUM  --  9.2  MG 2.1  --     Liver Function Tests: Recent Labs  Lab 06/01/22 0805  AST 16  ALT 11  ALKPHOS 113  BILITOT 1.5*  PROT 7.1  ALBUMIN 3.8   No results for input(s): "LIPASE", "AMYLASE" in the last 168 hours. No results for input(s): "AMMONIA" in the last 168 hours.  CBC: Recent Labs  Lab 06/01/22 0805  WBC 8.6  HGB 14.0  HCT 43.3  MCV 86.9  PLT 132*    Cardiac Enzymes: No results for input(s): "CKTOTAL", "CKMB", "CKMBINDEX", "TROPONINI" in the last 168 hours.  BNP: BNP (last 3 results) Recent Labs    05/10/22 1248 05/15/22 0726 06/01/22 0805  BNP 344.0* 509.1* 2,025.3*    ProBNP (last 3 results) No results for input(s): "PROBNP" in the last 8760 hours.   CBG: Recent Labs  Lab 06/01/22 1047  GLUCAP 172*    Coagulation Studies: No results for input(s): "LABPROT", "INR" in the last 72 hours.   Imaging   DG Chest 2 View  Result Date: 06/01/2022 CLINICAL DATA:  Chest pain EXAM: CHEST - 2 VIEW COMPARISON:  Radiograph 02/05/2022 FINDINGS: Unchanged cardiomediastinal silhouette. Electronic device overlies the left upper chest. There are diffuse interstitial opacities and lower lung predominant airspace opacities with small pleural effusions. Bibasilar atelectasis. No evidence of pneumothorax. No acute osseous abnormality. Thoracic spondylosis. IMPRESSION: Mild-to-moderate pulmonary edema with small pleural effusions and bibasilar atelectasis. Electronically Signed   By: Maurine Simmering M.D.   On: 06/01/2022 08:01     Medications:     Current Medications:  aspirin EC  81 mg Oral BID   atorvastatin  40 mg Oral QHS   brimonidine  1 drop Both Eyes TID   carvedilol  25 mg Oral BID   cholecalciferol  2,000 Units Oral Daily   DULoxetine  60 mg Oral Daily   enoxaparin (LOVENOX) injection  0.5 mg/kg Subcutaneous Q24H   insulin aspart  0-5 Units Subcutaneous QHS   insulin aspart  0-9 Units Subcutaneous TID WC   latanoprost  1 drop Both  Eyes QHS   mometasone-formoterol  2 puff Inhalation BID   And   umeclidinium bromide  1 puff Inhalation Daily   multivitamin with minerals  1 tablet Oral Daily   omega-3 acid ethyl esters  1,000 mg Oral TID PC   OXcarbazepine  150 mg Oral BID   pantoprazole  40 mg Oral Daily   potassium chloride  40 mEq Oral Q4H   sacubitril-valsartan  1 tablet Oral BID   spironolactone  25 mg Oral Daily   timolol  1 drop Both Eyes Daily    Infusions:     Patient Profile   71 y.o. male with history of HFrEF, DM II, OSA on CPAP, obesity, HTN, prior tobacco use.  Admitted with a/c CHF.   Assessment/Plan   1. Acute on chronic systolic HF - Echocardiogram 03/2022 EF 30 to 35% - Cath 11/23 non-obstructive CAD -  Suspect likely related to hypertensive crisis in setting of acute stress.  - BNP 509 -> 2025 CXR with pulmonary edema.  - Volume status hard to assess on exam - Will check ReDS level. If < 43% will  give another dose of IV lasix and let him go home with Furoscix x 2 days and f/u in HF Clinic on Monday -> ReDS attempted but low quality x 3 which can be seen in patients with high lung water. Will keep in house for 1-2 more days of IV diurese. Give additional lasix 80 IV now with metolazone 2.5 x 2 days.  - Continue Entresto to 97/103 bid - Continue carvedilol  - Increase spiro to 25 daily - Consider Bidil tomorrow  - Patient did not tolerate Jardiance in the past, developed yeast infection - Etiology of CM unclear. Has cMRI pending - has strong Fhx so will proceed with w/u for TTR amyloid as well as dilated CM - On ECG has frequent PVCs so will need Zio to quantify   2. HTN - Blood pressure still elevated  - Increase spiro to 25 - Give IV hydral. Consider Bidil tomorrow   3. OSA - compliant with CPAP   4. Morbid obesity  - continue GLP-1 RA  5. Hypokalemia - supp   6. PVCs - Zio in place     Length of Stay: 0 Glori Bickers, MD  2:47 PM   Advanced Heart Failure  Team Pager [phone] (M-F; 7a - 5p)  Please contact [facility] for night-coverage after hours (4p -7a ) and weekends on [email or web address]

## 2022-06-02 DIAGNOSIS — I5023 Acute on chronic systolic (congestive) heart failure: Secondary | ICD-10-CM | POA: Diagnosis not present

## 2022-06-02 DIAGNOSIS — N183 Chronic kidney disease, stage 3 unspecified: Secondary | ICD-10-CM

## 2022-06-02 DIAGNOSIS — I2510 Atherosclerotic heart disease of native coronary artery without angina pectoris: Secondary | ICD-10-CM

## 2022-06-02 DIAGNOSIS — E7800 Pure hypercholesterolemia, unspecified: Secondary | ICD-10-CM

## 2022-06-02 LAB — LIPID PANEL
Cholesterol: 134 mg/dL (ref 0–200)
HDL: 36 mg/dL — ABNORMAL LOW (ref >40)
LDL Cholesterol: 89 mg/dL (ref 0–99)
Total CHOL/HDL Ratio: 3.7 RATIO
Triglycerides: 45 mg/dL (ref <150)
VLDL: 9 mg/dL (ref 0–40)

## 2022-06-02 LAB — BASIC METABOLIC PANEL
Anion gap: 9 (ref 5–15)
BUN: 17 mg/dL (ref 8–23)
CO2: 28 mmol/L (ref 22–32)
Calcium: 9 mg/dL (ref 8.9–10.3)
Chloride: 101 mmol/L (ref 98–111)
Creatinine, Ser: 1.31 mg/dL — ABNORMAL HIGH (ref 0.61–1.24)
GFR, Estimated: 59 mL/min — ABNORMAL LOW (ref >60)
Glucose, Bld: 133 mg/dL — ABNORMAL HIGH (ref 70–99)
Potassium: 3.5 mmol/L (ref 3.5–5.1)
Sodium: 138 mmol/L (ref 135–145)

## 2022-06-02 LAB — CBG MONITORING, ED
Glucose-Capillary: 128 mg/dL — ABNORMAL HIGH (ref 70–99)
Glucose-Capillary: 152 mg/dL — ABNORMAL HIGH (ref 70–99)

## 2022-06-02 LAB — GLUCOSE, CAPILLARY: Glucose-Capillary: 85 mg/dL (ref 70–99)

## 2022-06-02 LAB — TROPONIN I (HIGH SENSITIVITY)
Troponin I (High Sensitivity): 15 ng/L (ref <18)
Troponin I (High Sensitivity): 16 ng/L (ref <18)

## 2022-06-02 LAB — MAGNESIUM: Magnesium: 2.2 mg/dL (ref 1.7–2.4)

## 2022-06-02 MED ORDER — FUROSEMIDE 10 MG/ML IJ SOLN
80.0000 mg | Freq: Once | INTRAMUSCULAR | Status: AC
  Administered 2022-06-02: 80 mg via INTRAVENOUS
  Filled 2022-06-02: qty 8

## 2022-06-02 NOTE — Progress Notes (Signed)
Rounding Note    Patient Name: Erik Black Date of Encounter: 06/02/2022  [facility] Cardiologist: Debbe Odea, MD   Subjective   UOP -1.6L. kidney function stable. The patient reports improved breathing, but still not at baseline. He is on 3L O2. He denies chest pain.   Inpatient Medications    Scheduled Meds:  aspirin EC  81 mg Oral BID   atorvastatin  40 mg Oral QHS   brimonidine  1 drop Both Eyes TID   carvedilol  25 mg Oral BID   cholecalciferol  2,000 Units Oral Daily   DULoxetine  60 mg Oral Daily   enoxaparin (LOVENOX) injection  0.5 mg/kg Subcutaneous Q24H   insulin aspart  0-5 Units Subcutaneous QHS   insulin aspart  0-9 Units Subcutaneous TID WC   latanoprost  1 drop Both Eyes QHS   metolazone  2.5 mg Oral Daily   mometasone-formoterol  2 puff Inhalation BID   And   umeclidinium bromide  1 puff Inhalation Daily   multivitamin with minerals  1 tablet Oral Daily   omega-3 acid ethyl esters  1,000 mg Oral TID PC   OXcarbazepine  150 mg Oral BID   pantoprazole  40 mg Oral Daily   sacubitril-valsartan  1 tablet Oral BID   spironolactone  25 mg Oral Daily   timolol  1 drop Both Eyes Daily   Continuous Infusions:  PRN Meds: acetaminophen, albuterol, dextromethorphan-guaiFENesin, fentaNYL (SUBLIMAZE) injection, hydrALAZINE, morphine injection, nitroGLYCERIN, ondansetron (ZOFRAN) IV   Vital Signs    Vitals:   06/01/22 2341 06/02/22 0345 06/02/22 0400 06/02/22 0506  BP: (!) 144/91  (!) 141/94 (!) 144/91  Pulse: 91 83 87 91  Resp: 20 16  20   Temp: 98.1 F (36.7 C)   98.1 F (36.7 C)  TempSrc: Oral   Oral  SpO2: 96% 98% 96% 97%  Weight:      Height:        Intake/Output Summary (Last 24 hours) at 06/02/2022 0736 Last data filed at 06/01/2022 1932 Gross per 24 hour  Intake 480 ml  Output 2100 ml  Net -1620 ml      06/01/2022    7:29 AM 05/10/2022   11:00 AM 05/08/2022    8:41 AM  Last 3 Weights  Weight (lbs) 291 lb 283 lb 282  lb 3.2 oz  Weight (kg) 131.997 kg 128.368 kg 128.005 kg      Telemetry    NSR PVCs, HR 80s - Personally Reviewed  ECG    No new - Personally Reviewed  Physical Exam   GEN: No acute distress.   Neck: No JVD Cardiac: RRR, no murmurs, rubs, or gallops.  Respiratory: Clear to auscultation bilaterally. GI: Soft, nontender, non-distended  MS: No edema; No deformity. Neuro:  Nonfocal  Psych: Normal affect   Labs    High Sensitivity Troponin:   Recent Labs  Lab 06/01/22 0739 06/01/22 1839 06/02/22 0504  TROPONINIHS 14 16 15  16      Chemistry Recent Labs  Lab 06/01/22 0739 06/01/22 0805 06/02/22 0504  NA  --  141 138  K  --  3.4* 3.5  CL  --  105 101  CO2  --  28 28  GLUCOSE  --  162* 133*  BUN  --  15 17  CREATININE  --  1.37* 1.31*  CALCIUM  --  9.2 9.0  MG 2.1  --  2.2  PROT  --  7.1  --   ALBUMIN  --  3.8  --   AST  --  16  --   ALT  --  11  --   ALKPHOS  --  113  --   BILITOT  --  1.5*  --   GFRNONAA  --  56* 59*  ANIONGAP  --  8 9    Lipids  Recent Labs  Lab 06/02/22 0504  CHOL 134  TRIG 45  HDL 36*  LDLCALC 89  CHOLHDL 3.7    Hematology Recent Labs  Lab 06/01/22 0805  WBC 8.6  RBC 4.98  HGB 14.0  HCT 43.3  MCV 86.9  MCH 28.1  MCHC 32.3  RDW 13.8  PLT 132*   Thyroid No results for input(s): "TSH", "FREET4" in the last 168 hours.  BNP Recent Labs  Lab 06/01/22 0805  BNP 2,025.3*    DDimer  Recent Labs  Lab 06/01/22 1100  DDIMER 0.75*     Radiology    US Venous Img Lower Bilateral (DVT)  Result Date: 06/01/2022 CLINICAL DATA:  Positive D-dimer EXAM: BILATERAL LOWER EXTREMITY VENOUS DOPPLER ULTRASOUND TECHNIQUE: Gray-scale sonography with graded compression, as well as color Doppler and duplex ultrasound were performed to evaluate the lower extremity deep venous systems from the level of the common femoral vein and including the common femoral, femoral, profunda femoral, popliteal and calf veins including the posterior  tibial, peroneal and gastrocnemius veins when visible. The superficial great saphenous vein was also interrogated. Spectral Doppler was utilized to evaluate flow at rest and with distal augmentation maneuvers in the common femoral, femoral and popliteal veins. COMPARISON:  None Available. FINDINGS: RIGHT LOWER EXTREMITY Common Femoral Vein: No evidence of thrombus. Normal compressibility, respiratory phasicity and response to augmentation. Saphenofemoral Junction: No evidence of thrombus. Normal compressibility and flow on color Doppler imaging. Profunda Femoral Vein: No evidence of thrombus. Normal compressibility and flow on color Doppler imaging. Femoral Vein: No evidence of thrombus. Normal compressibility, respiratory phasicity and response to augmentation. Popliteal Vein: No evidence of thrombus. Normal compressibility, respiratory phasicity and response to augmentation. Calf Veins: No evidence of thrombus. Normal compressibility and flow on color Doppler imaging. Superficial Great Saphenous Vein: No evidence of thrombus. Normal compressibility. Venous Reflux:  None. Other Findings:  None. LEFT LOWER EXTREMITY Common Femoral Vein: No evidence of thrombus. Normal compressibility, respiratory phasicity and response to augmentation. Saphenofemoral Junction: No evidence of thrombus. Normal compressibility and flow on color Doppler imaging. Profunda Femoral Vein: No evidence of thrombus. Normal compressibility and flow on color Doppler imaging. Femoral Vein: No evidence of thrombus. Normal compressibility, respiratory phasicity and response to augmentation. Popliteal Vein: No evidence of thrombus. Normal compressibility, respiratory phasicity and response to augmentation. Calf Veins: No evidence of thrombus. Normal compressibility and flow on color Doppler imaging. Superficial Great Saphenous Vein: No evidence of thrombus. Normal compressibility. Venous Reflux:  None. Other Findings:  None. IMPRESSION: No evidence  of deep venous thrombosis in either lower extremity. Electronically Signed   By: Ronney Asters M.D.   On: 06/01/2022 20:08   CT Angio Chest Pulmonary Embolism (PE) W or WO Contrast  Result Date: 06/01/2022 CLINICAL DATA:  High probability for PE. Shortness of breath with chest pain. EXAM: CT ANGIOGRAPHY CHEST WITH CONTRAST TECHNIQUE: Multidetector CT imaging of the chest was performed using the standard protocol during bolus administration of intravenous contrast. Multiplanar CT image reconstructions and MIPs were obtained to evaluate the vascular anatomy. RADIATION DOSE REDUCTION: This exam was performed according to the departmental dose-optimization program which includes automated exposure  control, adjustment of the mA and/or kV according to patient size and/or use of iterative reconstruction technique. CONTRAST:  69mL OMNIPAQUE IOHEXOL 350 MG/ML SOLN COMPARISON:  None Available. FINDINGS: Cardiovascular: Satisfactory opacification of the pulmonary arteries to the segmental level. No evidence of pulmonary embolism. Heart is mildly enlarged. No pericardial effusion. There are atherosclerotic calcifications of the aorta and coronary arteries. Mediastinum/Nodes: There is a hypodense left thyroid nodule measuring 17 mm. There are numerous nonenlarged mediastinal and hilar lymph nodes. There is an enlarged subcarinal lymph node measuring 13 mm short axis. Visualized esophagus is within normal limits. Lungs/Pleura: There are small bilateral pleural effusions, left greater than right. There are patchy ground-glass opacities throughout both lungs with some smooth interlobular septal thickening. There also some ill-defined ground-glass nodular densities measuring 4 mm or less in the left upper lobe. There is some central peribronchial wall thickening bilaterally. Large bulla noted in the right upper lobe measuring 12 x 6 x 10 cm. Upper Abdomen: No acute abnormality. Musculoskeletal: Degenerative changes affect the  spine. Review of the MIP images confirms the above findings. IMPRESSION: 1. No evidence for pulmonary embolism. 2. Small bilateral pleural effusions. 3. Patchy ground-glass opacities throughout both lungs with smooth interlobular septal thickening. Findings are favored as pulmonary edema. Atypical infection can not be excluded. 4. There are some ill-defined ground-glass nodular densities in the left upper lobe measuring 4 mm or less. These may be infectious/inflammatory. Follow-up chest CT recommended in 3-6 months. 5. Central peribronchial wall thickening worrisome for bronchitis. 6. Mild mediastinal lymphadenopathy, likely reactive. 7. 17 mm left thyroid nodule. Nonemergent thyroid ultrasound recommended. Aortic Atherosclerosis (ICD10-I70.0). Electronically Signed   By: Ronney Asters M.D.   On: 06/01/2022 19:16   DG Chest 2 View  Result Date: 06/01/2022 CLINICAL DATA:  Chest pain EXAM: CHEST - 2 VIEW COMPARISON:  Radiograph 02/05/2022 FINDINGS: Unchanged cardiomediastinal silhouette. Electronic device overlies the left upper chest. There are diffuse interstitial opacities and lower lung predominant airspace opacities with small pleural effusions. Bibasilar atelectasis. No evidence of pneumothorax. No acute osseous abnormality. Thoracic spondylosis. IMPRESSION: Mild-to-moderate pulmonary edema with small pleural effusions and bibasilar atelectasis. Electronically Signed   By: Maurine Simmering M.D.   On: 06/01/2022 08:01    Cardiac Studies   Echo 03/2022  1. Left ventricular ejection fraction, by estimation, is 30 to 35%. The  left ventricle has moderately decreased function. The left ventricle  demonstrates global hypokinesis. The left ventricular internal cavity size  was mildly dilated. Left ventricular  diastolic parameters were normal.   2. Right ventricular systolic function is normal. The right ventricular  size is normal.   3. Left atrial size was mildly dilated.   4. Right atrial size was mildly  dilated.   5. The mitral valve is normal in structure. Mild mitral valve  regurgitation.   6. The aortic valve is normal in structure. Aortic valve regurgitation is  not visualized.   Cardiac cath 05/2022    Mid RCA to Dist RCA lesion is 30% stenosed.   Ost Cx to Prox Cx lesion is 55% stenosed.   Dist LAD lesion is 60% stenosed.   LV end diastolic pressure is severely elevated.   Hemodynamic findings consistent with moderate pulmonary hypertension.   There is no aortic valve stenosis.   Nonischemic Cardiomyopathy: Angiographically Moderate 3-vessel disease Acute on Chronic severe Combined Systolic and Diastolic Heart Failure-known EF of 30 to 35% /Cardiac Index of 2.25.  PCWP 38 mmHg, PA mean 41 mmHg, and LVEDP of  40 mmHg. Right Heart Cath Numbers: RAP mean 18 mmHg, RV-EDP 68/11-24 mmHg; PAP 69/39 mmHg-mean 41 mmHg; PCWP 38 to 41 mmHg. Ao sat 95%, PA sat 66%. Cardiac Output 5.59--Index 2.25        PLAN With severe acute on chronic combined systolic and diastolic failure, discussed with the patient and Dr. Garen Lah, the plan will be to admit to the hospital service for management of heart failure.  He has been given 40 mg of Lasix in the Cath Lab and will give another 80 g tonight.  Need to closely follow his potassium levels.   Per Dr. Garen Lah, plan for medication management as follows: Acute combined CHF:  Converted from Lopressor to carvedilol 12.5 mg twice daily Continue Entresto 49 and 51 mg daily and titrate up as tolerated We will need to determine appropriate dose of outpatient Lasix, but for now we will do 80 mg IV twice daily-depending on urine output may or may not need to consider inotropic support  If pressure tolerates, would add spironolactone Patient has had a a severe reaction to Jardiance in the past with yeast infection and skin peeling.  Therefore no SGLT2 inhibitor, but consider GLP-1 agonist.  Top Lopressor, stop Norvasc.  Start Coreg 12.5 mg twice daily,  continue Entresto 49-51 mg twice daily, continue Lasix 20 mg daily.  Schedule right and left heart cath to evaluate ischemic cause.  Titrate GDMT as BP permits.  Consider adding Aldactone at follow-up visit.  Patient did not tolerate Jardiance in the past, developed yeast infection.   Hyperlipidemia, continue Lipitor 40 mg daily.  Obtain fasting lipid profile. CAD/coronary calcifications on chest CT. patient has no chest pain.  Angiographically normal coronary arteries.  Continue aspirin & Lipitor.     Glenetta Hew, MD        Patient Profile     71 y.o. male with history of HFrEF, DM II, OSA on CPAP, obesity, HTN, prior tobacco use. Admitted with a/c CHF.   Assessment & Plan    Acute on chronic systolic HF - Echo [id number] showed LVEF 30-35% - Cardiac cath 11/23 showed nonobstructive CAD - suspect acute heart failure related to hypertensive crisis in the setting of acute stress - BNP [id number] - CXR with pulmonary edema - IV lasix 80 mg and metolazone 2.5mg  - continue Entresto 97/103 BID - continue Coreg - spiro 25mg  daily - patient did not tolerate Jaridance in the past - can consider Bidil - difficult to assess volume - continue with diuresis  HTN - BP good, continue current medications  OSA - continue CPAP  Morbid obesity - recommend weight loss  Hypokalemia - supplemented  PVCs - zio  For questions or updates, please contact [place] Please consult [email or web address] for contact info under        Signed, Iridiana Fonner Ninfa Meeker, PA-C  06/02/2022, 7:36 AM

## 2022-06-02 NOTE — Progress Notes (Signed)
[facility] [place] NAME: Erik Black    MR#:  [id number]  DATE OF BIRTH:  1951/05/14  SUBJECTIVE:   Patient came in with increasing shortness of breath. Feels a bit better after IV Lasix. Wife at bedside. Tolerating PO diet.   VITALS:  Blood pressure 129/84, pulse 74, temperature (!) 97.4 F (36.3 C), temperature source Oral, resp. rate 18, height 6' (1.829 m), weight 132 kg, SpO2 98 %.  PHYSICAL EXAMINATION:   GENERAL:  71 y.o.-year-old patient lying in the bed with no acute distress.  LUNGS: Normal breath sounds bilaterally, no wheezing CARDIOVASCULAR: S1, S2 normal. No murmurs,   ABDOMEN: Soft, nontender, nondistended. Bowel sounds present.  EXTREMITIES: No  edema b/l.    NEUROLOGIC: nonfocal  patient is alert and awake SKIN: No obvious rash, lesion, or ulcer.   LABORATORY PANEL:  CBC Recent Labs  Lab 06/01/22 0805  WBC 8.6  HGB 14.0  HCT 43.3  PLT 132*    Chemistries  Recent Labs  Lab 06/01/22 0805 06/02/22 0504  NA 141 138  K 3.4* 3.5  CL 105 101  CO2 28 28  GLUCOSE 162* 133*  BUN 15 17  CREATININE 1.37* 1.31*  CALCIUM 9.2 9.0  MG  --  2.2  AST 16  --   ALT 11  --   ALKPHOS 113  --   BILITOT 1.5*  --    Cardiac Enzymes No results for input(s): "TROPONINI" in the last 168 hours. RADIOLOGY:  US Venous Img Lower Bilateral (DVT)  Result Date: 06/01/2022 CLINICAL DATA:  Positive D-dimer EXAM: BILATERAL LOWER EXTREMITY VENOUS DOPPLER ULTRASOUND TECHNIQUE: Gray-scale sonography with graded compression, as well as color Doppler and duplex ultrasound were performed to evaluate the lower extremity deep venous systems from the level of the common femoral vein and including the common femoral, femoral, profunda femoral, popliteal and calf veins including the posterior tibial, peroneal and gastrocnemius veins when visible. The superficial great saphenous vein was also interrogated. Spectral Doppler was utilized  to evaluate flow at rest and with distal augmentation maneuvers in the common femoral, femoral and popliteal veins. COMPARISON:  None Available. FINDINGS: RIGHT LOWER EXTREMITY Common Femoral Vein: No evidence of thrombus. Normal compressibility, respiratory phasicity and response to augmentation. Saphenofemoral Junction: No evidence of thrombus. Normal compressibility and flow on color Doppler imaging. Profunda Femoral Vein: No evidence of thrombus. Normal compressibility and flow on color Doppler imaging. Femoral Vein: No evidence of thrombus. Normal compressibility, respiratory phasicity and response to augmentation. Popliteal Vein: No evidence of thrombus. Normal compressibility, respiratory phasicity and response to augmentation. Calf Veins: No evidence of thrombus. Normal compressibility and flow on color Doppler imaging. Superficial Great Saphenous Vein: No evidence of thrombus. Normal compressibility. Venous Reflux:  None. Other Findings:  None. LEFT LOWER EXTREMITY Common Femoral Vein: No evidence of thrombus. Normal compressibility, respiratory phasicity and response to augmentation. Saphenofemoral Junction: No evidence of thrombus. Normal compressibility and flow on color Doppler imaging. Profunda Femoral Vein: No evidence of thrombus. Normal compressibility and flow on color Doppler imaging. Femoral Vein: No evidence of thrombus. Normal compressibility, respiratory phasicity and response to augmentation. Popliteal Vein: No evidence of thrombus. Normal compressibility, respiratory phasicity and response to augmentation. Calf Veins: No evidence of thrombus. Normal compressibility and flow on color Doppler imaging. Superficial Great Saphenous Vein: No evidence of thrombus. Normal compressibility. Venous Reflux:  None. Other Findings:  None. IMPRESSION: No evidence of deep venous thrombosis in either lower extremity.  Electronically Signed   By: Ronney Asters M.D.   On: 06/01/2022 20:08   CT Angio Chest  Pulmonary Embolism (PE) W or WO Contrast  Result Date: 06/01/2022 CLINICAL DATA:  High probability for PE. Shortness of breath with chest pain. EXAM: CT ANGIOGRAPHY CHEST WITH CONTRAST TECHNIQUE: Multidetector CT imaging of the chest was performed using the standard protocol during bolus administration of intravenous contrast. Multiplanar CT image reconstructions and MIPs were obtained to evaluate the vascular anatomy. RADIATION DOSE REDUCTION: This exam was performed according to the departmental dose-optimization program which includes automated exposure control, adjustment of the mA and/or kV according to patient size and/or use of iterative reconstruction technique. CONTRAST:  3mL OMNIPAQUE IOHEXOL 350 MG/ML SOLN COMPARISON:  None Available. FINDINGS: Cardiovascular: Satisfactory opacification of the pulmonary arteries to the segmental level. No evidence of pulmonary embolism. Heart is mildly enlarged. No pericardial effusion. There are atherosclerotic calcifications of the aorta and coronary arteries. Mediastinum/Nodes: There is a hypodense left thyroid nodule measuring 17 mm. There are numerous nonenlarged mediastinal and hilar lymph nodes. There is an enlarged subcarinal lymph node measuring 13 mm short axis. Visualized esophagus is within normal limits. Lungs/Pleura: There are small bilateral pleural effusions, left greater than right. There are patchy ground-glass opacities throughout both lungs with some smooth interlobular septal thickening. There also some ill-defined ground-glass nodular densities measuring 4 mm or less in the left upper lobe. There is some central peribronchial wall thickening bilaterally. Large bulla noted in the right upper lobe measuring 12 x 6 x 10 cm. Upper Abdomen: No acute abnormality. Musculoskeletal: Degenerative changes affect the spine. Review of the MIP images confirms the above findings. IMPRESSION: 1. No evidence for pulmonary embolism. 2. Small bilateral pleural  effusions. 3. Patchy ground-glass opacities throughout both lungs with smooth interlobular septal thickening. Findings are favored as pulmonary edema. Atypical infection can not be excluded. 4. There are some ill-defined ground-glass nodular densities in the left upper lobe measuring 4 mm or less. These may be infectious/inflammatory. Follow-up chest CT recommended in 3-6 months. 5. Central peribronchial wall thickening worrisome for bronchitis. 6. Mild mediastinal lymphadenopathy, likely reactive. 7. 17 mm left thyroid nodule. Nonemergent thyroid ultrasound recommended. Aortic Atherosclerosis (ICD10-I70.0). Electronically Signed   By: Ronney Asters M.D.   On: 06/01/2022 19:16   DG Chest 2 View  Result Date: 06/01/2022 CLINICAL DATA:  Chest pain EXAM: CHEST - 2 VIEW COMPARISON:  Radiograph 02/05/2022 FINDINGS: Unchanged cardiomediastinal silhouette. Electronic device overlies the left upper chest. There are diffuse interstitial opacities and lower lung predominant airspace opacities with small pleural effusions. Bibasilar atelectasis. No evidence of pneumothorax. No acute osseous abnormality. Thoracic spondylosis. IMPRESSION: Mild-to-moderate pulmonary edema with small pleural effusions and bibasilar atelectasis. Electronically Signed   By: Maurine Simmering M.D.   On: 06/01/2022 08:01    Assessment and Plan   Obadiah Siconolfi is a 71 y.o. male with medical history significant of sCHF with EF of 30 to 35%, CAD, HTN, HLD, DM, OSA on CPAP, CKD-3a, depression with anxiety, obesity, who presents with shortness breath and chest pain.    Patient has shortness of breath for more than 2 days, which has been progressively worsening.  Acute on chronic systolic CHF (congestive heart failure) [facility]): Patient has positive JVD, crackles on auscultation, leg edema, elevated BNP 2025, pulmonary edema chest x-ray, clinically consistent with CHF exacerbation.  2D echo on 03/07/2022 showed EF 30-35%. --seen by Dr. Haroldine Laws of  cardiology  -Patient received 80 mg of Lasix in ED -  Give additional lasix 80 IV now with metolazone 2.5 x 2 days. Per Dr. Gala Romney - Continue Sherryll Burger to 97/103 bid - Continue carvedilol  - Increased spiro to 25 daily by Dr. Gala Romney -Daily weights -strict I/O's -Low salt diet -Fluid restriction  CAD (coronary artery disease) and chest pain: Troponin 14. D-dimer positive 0.75.   -ASA and lipitor -trend trop -f/u CTA  no PE, + pulm edema and LE doppler neg for DVT -prn nitro and morphine   Type II diabetes mellitus with renal manifestations (HCC):  --A1c  7.0 recently.  Patient is taking semaglutide at home -SSI   Hypertension -IV hydralazine as needed -Patient is on Coreg, Entresto,  -cont  on IV Lasix and metolazone, spironolactone   HLD (hyperlipidemia) -Lipitor   Depression with anxiety -Continue home medications   Sleep apnea -CPAP   Morbid obesity (HCC) -Healthy diet and exercise -Encourage to lose weight   Hypokalemia: Potassium 3.4 - magnesium level --> 2.1   Chronic kidney disease, stage 3a (HCC): Stable --creat 1.31       DVT ppx: SQ Lovenox   Code Status: Full code per pt and his wife    Family Communication: Yes, patient's  wife at bed side.   Disposition Plan:  Anticipate discharge back to previous environment   Consults called: [facility] cardiology   Admission status and Level of care: Telemetry Cardiac:  as inpt  for CHF management     Dispo: The patient is from: Home              Anticipated d/c is to: Home              Anticipated d/c date is: 2 days   TOTAL TIME TAKING CARE OF THIS PATIENT: 35 minutes.  >50% time spent on counselling and coordination of care  Note: This dictation was prepared with Dragon dictation along with smaller phrase technology. Any transcriptional errors that result from this process are unintentional.  Enedina Finner M.D    [facility]   CC: Primary care physician; [facility]

## 2022-06-03 DIAGNOSIS — I5023 Acute on chronic systolic (congestive) heart failure: Secondary | ICD-10-CM | POA: Diagnosis not present

## 2022-06-03 DIAGNOSIS — R079 Chest pain, unspecified: Secondary | ICD-10-CM

## 2022-06-03 DIAGNOSIS — I493 Ventricular premature depolarization: Secondary | ICD-10-CM

## 2022-06-03 LAB — GLUCOSE, CAPILLARY
Glucose-Capillary: 124 mg/dL — ABNORMAL HIGH (ref 70–99)
Glucose-Capillary: 181 mg/dL — ABNORMAL HIGH (ref 70–99)
Glucose-Capillary: 127 mg/dL — ABNORMAL HIGH (ref 70–99)
Glucose-Capillary: 162 mg/dL — ABNORMAL HIGH (ref 70–99)

## 2022-06-03 LAB — BASIC METABOLIC PANEL
Anion gap: 7 (ref 5–15)
BUN: 24 mg/dL — ABNORMAL HIGH (ref 8–23)
CO2: 34 mmol/L — ABNORMAL HIGH (ref 22–32)
Calcium: 10 mg/dL (ref 8.9–10.3)
Chloride: 97 mmol/L — ABNORMAL LOW (ref 98–111)
Creatinine, Ser: 1.51 mg/dL — ABNORMAL HIGH (ref 0.61–1.24)
GFR, Estimated: 49 mL/min — ABNORMAL LOW (ref >60)
Glucose, Bld: 125 mg/dL — ABNORMAL HIGH (ref 70–99)
Potassium: 2.9 mmol/L — ABNORMAL LOW (ref 3.5–5.1)
Sodium: 138 mmol/L (ref 135–145)

## 2022-06-03 LAB — TROPONIN I (HIGH SENSITIVITY): Troponin I (High Sensitivity): 13 ng/L (ref <18)

## 2022-06-03 LAB — MAGNESIUM: Magnesium: 2.3 mg/dL (ref 1.7–2.4)

## 2022-06-03 MED ORDER — FUROSEMIDE 10 MG/ML IJ SOLN
40.0000 mg | Freq: Two times a day (BID) | INTRAMUSCULAR | Status: DC
  Administered 2022-06-03 (×2): 40 mg via INTRAVENOUS
  Filled 2022-06-03 (×2): qty 4

## 2022-06-03 NOTE — Progress Notes (Signed)
Rounding Note    Patient Name: Erik Black Date of Encounter: 06/03/2022  [facility] Cardiologist: Kate Sable, MD   Subjective   Breathing improving  Inpatient Medications    Scheduled Meds:  aspirin EC  81 mg Oral BID   atorvastatin  40 mg Oral QHS   brimonidine  1 drop Both Eyes TID   carvedilol  25 mg Oral BID   cholecalciferol  2,000 Units Oral Daily   DULoxetine  60 mg Oral Daily   enoxaparin (LOVENOX) injection  0.5 mg/kg Subcutaneous Q24H   furosemide  40 mg Intravenous Q12H   insulin aspart  0-5 Units Subcutaneous QHS   insulin aspart  0-9 Units Subcutaneous TID WC   latanoprost  1 drop Both Eyes QHS   metolazone  2.5 mg Oral Daily   mometasone-formoterol  2 puff Inhalation BID   And   umeclidinium bromide  1 puff Inhalation Daily   multivitamin with minerals  1 tablet Oral Daily   omega-3 acid ethyl esters  1,000 mg Oral TID PC   OXcarbazepine  150 mg Oral BID   pantoprazole  40 mg Oral Daily   sacubitril-valsartan  1 tablet Oral BID   spironolactone  25 mg Oral Daily   timolol  1 drop Both Eyes Daily   Continuous Infusions:  PRN Meds: acetaminophen, albuterol, dextromethorphan-guaiFENesin, fentaNYL (SUBLIMAZE) injection, hydrALAZINE, morphine injection, nitroGLYCERIN, ondansetron (ZOFRAN) IV   Vital Signs    Vitals:   06/03/22 0500 06/03/22 0853 06/03/22 1152 06/03/22 1631  BP:  (!) 160/95 131/73 126/67  Pulse:  65 69 73  Resp:  18 17 18   Temp:  97.8 F (36.6 C) 97.9 F (36.6 C) 97.8 F (36.6 C)  TempSrc:  Oral    SpO2:  100% 98% 99%  Weight: 131 kg     Height:        Intake/Output Summary (Last 24 hours) at 06/03/2022 1703 Last data filed at 06/03/2022 1630 Gross per 24 hour  Intake --  Output 2450 ml  Net -2450 ml      06/03/2022    5:00 AM 06/01/2022    7:29 AM 05/10/2022   11:00 AM  Last 3 Weights  Weight (lbs) 288 lb 12.8 oz 291 lb 283 lb  Weight (kg) 131 kg 131.997 kg 128.368 kg      Telemetry     Sinus rhythm.  No events - Personally Reviewed  ECG    N/a - Personally Reviewed  Physical Exam   VS:  BP 126/67 (BP Location: Right Arm)   Pulse 73   Temp 97.8 F (36.6 C)   Resp 18   Ht 6' (1.829 m)   Wt 131 kg   SpO2 99%   BMI 39.17 kg/m  , BMI Body mass index is 39.17 kg/m. GENERAL:  Well appearing HEENT: Pupils equal round and reactive, fundi not visualized, oral mucosa unremarkable NECK:  JVP 2cm above clavicle at 45 degrees.  waveform within normal limits, carotid upstroke brisk and symmetric, no bruits, no thyromegaly LUNGS:  Clear to auscultation bilaterally HEART:  RRR.  PMI not displaced or sustained,S1 and S2 within normal limits, no S3, no S4, no clicks, no rubs, no murmurs ABD:  Flat, positive bowel sounds normal in frequency in pitch, no bruits, no rebound, no guarding, no midline pulsatile mass, no hepatomegaly, no splenomegaly EXT:  2 plus pulses throughout, no edema, no cyanosis no clubbing SKIN:  No rashes no nodules NEURO:  Cranial nerves II through  XII grossly intact, motor grossly intact throughout PSYCH:  Cognitively intact, oriented to person place and time   Labs    High Sensitivity Troponin:   Recent Labs  Lab 06/01/22 0739 06/01/22 1839 06/02/22 0504  TROPONINIHS 14 16 15  16      Chemistry Recent Labs  Lab 06/01/22 0739 06/01/22 0805 06/02/22 0504  NA  --  141 138  K  --  3.4* 3.5  CL  --  105 101  CO2  --  28 28  GLUCOSE  --  162* 133*  BUN  --  15 17  CREATININE  --  1.37* 1.31*  CALCIUM  --  9.2 9.0  MG 2.1  --  2.2  PROT  --  7.1  --   ALBUMIN  --  3.8  --   AST  --  16  --   ALT  --  11  --   ALKPHOS  --  113  --   BILITOT  --  1.5*  --   GFRNONAA  --  56* 59*  ANIONGAP  --  8 9    Lipids  Recent Labs  Lab 06/02/22 0504  CHOL 134  TRIG 45  HDL 36*  LDLCALC 89  CHOLHDL 3.7    Hematology Recent Labs  Lab 06/01/22 0805  WBC 8.6  RBC 4.98  HGB 14.0  HCT 43.3  MCV 86.9  MCH 28.1  MCHC 32.3  RDW 13.8   PLT 132*   Thyroid No results for input(s): "TSH", "FREET4" in the last 168 hours.  BNP Recent Labs  Lab 06/01/22 0805  BNP 2,025.3*    DDimer  Recent Labs  Lab 06/01/22 1100  DDIMER 0.75*     Radiology    US Venous Img Lower Bilateral (DVT)  Result Date: 06/01/2022 CLINICAL DATA:  Positive D-dimer EXAM: BILATERAL LOWER EXTREMITY VENOUS DOPPLER ULTRASOUND TECHNIQUE: Gray-scale sonography with graded compression, as well as color Doppler and duplex ultrasound were performed to evaluate the lower extremity deep venous systems from the level of the common femoral vein and including the common femoral, femoral, profunda femoral, popliteal and calf veins including the posterior tibial, peroneal and gastrocnemius veins when visible. The superficial great saphenous vein was also interrogated. Spectral Doppler was utilized to evaluate flow at rest and with distal augmentation maneuvers in the common femoral, femoral and popliteal veins. COMPARISON:  None Available. FINDINGS: RIGHT LOWER EXTREMITY Common Femoral Vein: No evidence of thrombus. Normal compressibility, respiratory phasicity and response to augmentation. Saphenofemoral Junction: No evidence of thrombus. Normal compressibility and flow on color Doppler imaging. Profunda Femoral Vein: No evidence of thrombus. Normal compressibility and flow on color Doppler imaging. Femoral Vein: No evidence of thrombus. Normal compressibility, respiratory phasicity and response to augmentation. Popliteal Vein: No evidence of thrombus. Normal compressibility, respiratory phasicity and response to augmentation. Calf Veins: No evidence of thrombus. Normal compressibility and flow on color Doppler imaging. Superficial Great Saphenous Vein: No evidence of thrombus. Normal compressibility. Venous Reflux:  None. Other Findings:  None. LEFT LOWER EXTREMITY Common Femoral Vein: No evidence of thrombus. Normal compressibility, respiratory phasicity and response to  augmentation. Saphenofemoral Junction: No evidence of thrombus. Normal compressibility and flow on color Doppler imaging. Profunda Femoral Vein: No evidence of thrombus. Normal compressibility and flow on color Doppler imaging. Femoral Vein: No evidence of thrombus. Normal compressibility, respiratory phasicity and response to augmentation. Popliteal Vein: No evidence of thrombus. Normal compressibility, respiratory phasicity and response to augmentation. Calf Veins: No evidence  of thrombus. Normal compressibility and flow on color Doppler imaging. Superficial Great Saphenous Vein: No evidence of thrombus. Normal compressibility. Venous Reflux:  None. Other Findings:  None. IMPRESSION: No evidence of deep venous thrombosis in either lower extremity. Electronically Signed   By: Ronney Asters M.D.   On: 06/01/2022 20:08   CT Angio Chest Pulmonary Embolism (PE) W or WO Contrast  Result Date: 06/01/2022 CLINICAL DATA:  High probability for PE. Shortness of breath with chest pain. EXAM: CT ANGIOGRAPHY CHEST WITH CONTRAST TECHNIQUE: Multidetector CT imaging of the chest was performed using the standard protocol during bolus administration of intravenous contrast. Multiplanar CT image reconstructions and MIPs were obtained to evaluate the vascular anatomy. RADIATION DOSE REDUCTION: This exam was performed according to the departmental dose-optimization program which includes automated exposure control, adjustment of the mA and/or kV according to patient size and/or use of iterative reconstruction technique. CONTRAST:  31mL OMNIPAQUE IOHEXOL 350 MG/ML SOLN COMPARISON:  None Available. FINDINGS: Cardiovascular: Satisfactory opacification of the pulmonary arteries to the segmental level. No evidence of pulmonary embolism. Heart is mildly enlarged. No pericardial effusion. There are atherosclerotic calcifications of the aorta and coronary arteries. Mediastinum/Nodes: There is a hypodense left thyroid nodule measuring 17  mm. There are numerous nonenlarged mediastinal and hilar lymph nodes. There is an enlarged subcarinal lymph node measuring 13 mm short axis. Visualized esophagus is within normal limits. Lungs/Pleura: There are small bilateral pleural effusions, left greater than right. There are patchy ground-glass opacities throughout both lungs with some smooth interlobular septal thickening. There also some ill-defined ground-glass nodular densities measuring 4 mm or less in the left upper lobe. There is some central peribronchial wall thickening bilaterally. Large bulla noted in the right upper lobe measuring 12 x 6 x 10 cm. Upper Abdomen: No acute abnormality. Musculoskeletal: Degenerative changes affect the spine. Review of the MIP images confirms the above findings. IMPRESSION: 1. No evidence for pulmonary embolism. 2. Small bilateral pleural effusions. 3. Patchy ground-glass opacities throughout both lungs with smooth interlobular septal thickening. Findings are favored as pulmonary edema. Atypical infection can not be excluded. 4. There are some ill-defined ground-glass nodular densities in the left upper lobe measuring 4 mm or less. These may be infectious/inflammatory. Follow-up chest CT recommended in 3-6 months. 5. Central peribronchial wall thickening worrisome for bronchitis. 6. Mild mediastinal lymphadenopathy, likely reactive. 7. 17 mm left thyroid nodule. Nonemergent thyroid ultrasound recommended. Aortic Atherosclerosis (ICD10-I70.0). Electronically Signed   By: Ronney Asters M.D.   On: 06/01/2022 19:16    Cardiac Studies   Echo 03/2022   1. Left ventricular ejection fraction, by estimation, is 30 to 35%. The  left ventricle has moderately decreased function. The left ventricle  demonstrates global hypokinesis. The left ventricular internal cavity size  was mildly dilated. Left ventricular  diastolic parameters were normal.   2. Right ventricular systolic function is normal. The right ventricular  size  is normal.   3. Left atrial size was mildly dilated.   4. Right atrial size was mildly dilated.   5. The mitral valve is normal in structure. Mild mitral valve  regurgitation.   6. The aortic valve is normal in structure. Aortic valve regurgitation is  not visualized.    Cardiac cath 05/2022    Mid RCA to Dist RCA lesion is 30% stenosed.   Ost Cx to Prox Cx lesion is 55% stenosed.   Dist LAD lesion is 60% stenosed.   LV end diastolic pressure is severely elevated.   Hemodynamic  findings consistent with moderate pulmonary hypertension.   There is no aortic valve stenosis.   Nonischemic Cardiomyopathy: Angiographically Moderate 3-vessel disease Acute on Chronic severe Combined Systolic and Diastolic Heart Failure-known EF of 30 to 35% /Cardiac Index of 2.25.  PCWP 38 mmHg, PA mean 41 mmHg, and LVEDP of 40 mmHg. Right Heart Cath Numbers: RAP mean 18 mmHg, RV-EDP 68/11-24 mmHg; PAP 69/39 mmHg-mean 41 mmHg; PCWP 38 to 41 mmHg. Ao sat 95%, PA sat 66%. Cardiac Output 5.59--Index 2.25    Patient Profile     Mr. Bowring is a 44M with Chronic systolic diastolic heart failure (LVEF 30 to 35%), nonobstructive CAD, hypertension, OSA on CPAP, and morbid obesity admitted with acute on chronic systolic diastolic heart failure exacerbation.   Assessment & Plan    # Acute on chronic systolic and diastolic heart failure: LVEF 30-35%.  Recent hospitalization for heart failure.  He is feeling better with diuresis.  No labs from today.  Will check BMP and magnesium.  Continue with IV lasix and metolazone today.  Likely transition to oral tomorrow. He hasn't tolerated Jardiance.   # PVCs: He has very frequent PVCs.  ?PVC cardiomyopathy.  Zio in place.  Consider switching carvedilol to metoprolol.  # Hypertension: BP controlled.  Continue Entresto, spironolactone, and carvedilol.   # Non-obstructive CAD:  # Hyperlipidemia: Patient notes atypical CP.  Check troponin.  Continue carvedilol, aspirin  and statin.  # OSA:  Continue CPAP.    For questions or updates, please contact [facility] Please consult [email or web address] for contact info under        Signed, Chilton Si, MD  06/03/2022, 5:03 PM

## 2022-06-03 NOTE — Progress Notes (Signed)
[place] [facility] [place] NAME: Erik Black    MR#:  [id number]  DATE OF BIRTH:  1951-05-11  SUBJECTIVE:   Patient came in with increasing shortness of breath. Feels a  better after IV Lasix. No family at bedside today. Tolerating PO diet.   VITALS:  Blood pressure 131/73, pulse 69, temperature 97.9 F (36.6 C), resp. rate 17, height 6' (1.829 m), weight 131 kg, SpO2 98 %.  PHYSICAL EXAMINATION:   GENERAL:  71 y.o.-year-old patient lying in the bed with no acute distress.  LUNGS: Normal breath sounds bilaterally, no wheezing CARDIOVASCULAR: S1, S2 normal. No murmur ABDOMEN: Soft, nontender, nondistended. Bowel sounds present.  EXTREMITIES: No  edema b/l.    NEUROLOGIC: nonfocal  patient is alert and awake SKIN: No obvious rash, lesion, or ulcer.   LABORATORY PANEL:  CBC Recent Labs  Lab 06/01/22 0805  WBC 8.6  HGB 14.0  HCT 43.3  PLT 132*     Chemistries  Recent Labs  Lab 06/01/22 0805 06/02/22 0504  NA 141 138  K 3.4* 3.5  CL 105 101  CO2 28 28  GLUCOSE 162* 133*  BUN 15 17  CREATININE 1.37* 1.31*  CALCIUM 9.2 9.0  MG  --  2.2  AST 16  --   ALT 11  --   ALKPHOS 113  --   BILITOT 1.5*  --     Cardiac Enzymes No results for input(s): "TROPONINI" in the last 168 hours. RADIOLOGY:  US Venous Img Lower Bilateral (DVT)  Result Date: 06/01/2022 CLINICAL DATA:  Positive D-dimer EXAM: BILATERAL LOWER EXTREMITY VENOUS DOPPLER ULTRASOUND TECHNIQUE: Gray-scale sonography with graded compression, as well as color Doppler and duplex ultrasound were performed to evaluate the lower extremity deep venous systems from the level of the common femoral vein and including the common femoral, femoral, profunda femoral, popliteal and calf veins including the posterior tibial, peroneal and gastrocnemius veins when visible. The superficial great saphenous vein was also interrogated. Spectral Doppler was utilized to evaluate flow at  rest and with distal augmentation maneuvers in the common femoral, femoral and popliteal veins. COMPARISON:  None Available. FINDINGS: RIGHT LOWER EXTREMITY Common Femoral Vein: No evidence of thrombus. Normal compressibility, respiratory phasicity and response to augmentation. Saphenofemoral Junction: No evidence of thrombus. Normal compressibility and flow on color Doppler imaging. Profunda Femoral Vein: No evidence of thrombus. Normal compressibility and flow on color Doppler imaging. Femoral Vein: No evidence of thrombus. Normal compressibility, respiratory phasicity and response to augmentation. Popliteal Vein: No evidence of thrombus. Normal compressibility, respiratory phasicity and response to augmentation. Calf Veins: No evidence of thrombus. Normal compressibility and flow on color Doppler imaging. Superficial Great Saphenous Vein: No evidence of thrombus. Normal compressibility. Venous Reflux:  None. Other Findings:  None. LEFT LOWER EXTREMITY Common Femoral Vein: No evidence of thrombus. Normal compressibility, respiratory phasicity and response to augmentation. Saphenofemoral Junction: No evidence of thrombus. Normal compressibility and flow on color Doppler imaging. Profunda Femoral Vein: No evidence of thrombus. Normal compressibility and flow on color Doppler imaging. Femoral Vein: No evidence of thrombus. Normal compressibility, respiratory phasicity and response to augmentation. Popliteal Vein: No evidence of thrombus. Normal compressibility, respiratory phasicity and response to augmentation. Calf Veins: No evidence of thrombus. Normal compressibility and flow on color Doppler imaging. Superficial Great Saphenous Vein: No evidence of thrombus. Normal compressibility. Venous Reflux:  None. Other Findings:  None. IMPRESSION: No evidence of deep venous thrombosis in either lower extremity. Electronically Signed  By: Ronney Asters M.D.   On: 06/01/2022 20:08   CT Angio Chest Pulmonary Embolism (PE)  W or WO Contrast  Result Date: 06/01/2022 CLINICAL DATA:  High probability for PE. Shortness of breath with chest pain. EXAM: CT ANGIOGRAPHY CHEST WITH CONTRAST TECHNIQUE: Multidetector CT imaging of the chest was performed using the standard protocol during bolus administration of intravenous contrast. Multiplanar CT image reconstructions and MIPs were obtained to evaluate the vascular anatomy. RADIATION DOSE REDUCTION: This exam was performed according to the departmental dose-optimization program which includes automated exposure control, adjustment of the mA and/or kV according to patient size and/or use of iterative reconstruction technique. CONTRAST:  35mL OMNIPAQUE IOHEXOL 350 MG/ML SOLN COMPARISON:  None Available. FINDINGS: Cardiovascular: Satisfactory opacification of the pulmonary arteries to the segmental level. No evidence of pulmonary embolism. Heart is mildly enlarged. No pericardial effusion. There are atherosclerotic calcifications of the aorta and coronary arteries. Mediastinum/Nodes: There is a hypodense left thyroid nodule measuring 17 mm. There are numerous nonenlarged mediastinal and hilar lymph nodes. There is an enlarged subcarinal lymph node measuring 13 mm short axis. Visualized esophagus is within normal limits. Lungs/Pleura: There are small bilateral pleural effusions, left greater than right. There are patchy ground-glass opacities throughout both lungs with some smooth interlobular septal thickening. There also some ill-defined ground-glass nodular densities measuring 4 mm or less in the left upper lobe. There is some central peribronchial wall thickening bilaterally. Large bulla noted in the right upper lobe measuring 12 x 6 x 10 cm. Upper Abdomen: No acute abnormality. Musculoskeletal: Degenerative changes affect the spine. Review of the MIP images confirms the above findings. IMPRESSION: 1. No evidence for pulmonary embolism. 2. Small bilateral pleural effusions. 3. Patchy  ground-glass opacities throughout both lungs with smooth interlobular septal thickening. Findings are favored as pulmonary edema. Atypical infection can not be excluded. 4. There are some ill-defined ground-glass nodular densities in the left upper lobe measuring 4 mm or less. These may be infectious/inflammatory. Follow-up chest CT recommended in 3-6 months. 5. Central peribronchial wall thickening worrisome for bronchitis. 6. Mild mediastinal lymphadenopathy, likely reactive. 7. 17 mm left thyroid nodule. Nonemergent thyroid ultrasound recommended. Aortic Atherosclerosis (ICD10-I70.0). Electronically Signed   By: Ronney Asters M.D.   On: 06/01/2022 19:16    Assessment and Plan   Yeudiel Faine is a 71 y.o. male with medical history significant of sCHF with EF of 30 to 35%, CAD, HTN, HLD, DM, OSA on CPAP, CKD-3a, depression with anxiety, obesity, who presents with shortness breath and chest pain.    Patient has shortness of breath for more than 2 days, which has been progressively worsening.  Acute on chronic systolic CHF (congestive heart failure) [facility]): Patient has positive JVD, crackles on auscultation, leg edema, elevated BNP 2025, pulmonary edema chest x-ray, clinically consistent with CHF exacerbation.  2D echo on 03/07/2022 showed EF 30-35%. --seen by Dr. Haroldine Laws of cardiology  -Patient received 80 mg of Lasix in ED - Give additional lasix 40 mg bid  now with metolazone 2.5 x 2 days.  - Continue Entresto to 97/103 bid - Continue carvedilol  - Increased spiro to 25 daily by Dr. Haroldine Laws -Daily weights -strict I/O's -Low salt diet -Fluid restriction  CAD (coronary artery disease) and chest pain: Troponin 14. D-dimer positive 0.75.   -ASA and lipitor -trend trop -f/u CTA  no PE, + pulm edema and LE doppler neg for DVT -prn nitro and morphine   Type II diabetes mellitus with renal manifestations ([place]):  --  A1c  7.0 recently.  Patient is taking semaglutide at home -SSI    Hypertension -IV hydralazine as needed -Patient is on Coreg, Entresto,  -cont  on IV Lasix and metolazone, spironolactone   HLD (hyperlipidemia) -Lipitor   Depression with anxiety -Continue home medications   Sleep apnea -CPAP   Morbid obesity (HCC) -Healthy diet and exercise -Encourage to lose weight   Hypokalemia: Potassium 3.4 - magnesium level --> 2.1   Chronic kidney disease, stage 3a (HCC): Stable --creat 1.31       DVT ppx: SQ Lovenox   Code Status: Full code per pt and his wife    Family Communication: Yes, patient's  wife at bed side.   Disposition Plan:  Anticipate discharge back to previous environment   Consults called: [facility] cardiology   Admission status and Level of care: Telemetry Cardiac:  as inpt  for CHF management     Dispo: The patient is from: Home              Anticipated d/c is to: Home              Anticipated d/c date is: 2 days   TOTAL TIME TAKING CARE OF THIS PATIENT: 35 minutes.  >50% time spent on counselling and coordination of care  Note: This dictation was prepared with Dragon dictation along with smaller phrase technology. Any transcriptional errors that result from this process are unintentional.  Enedina Finner M.D    [facility]   CC: Primary care physician; [facility]

## 2022-06-04 DIAGNOSIS — I5023 Acute on chronic systolic (congestive) heart failure: Secondary | ICD-10-CM | POA: Diagnosis not present

## 2022-06-04 LAB — HEMOGLOBIN A1C
Hgb A1c MFr Bld: 7.7 % — ABNORMAL HIGH (ref 4.8–5.6)
Mean Plasma Glucose: 174 mg/dL

## 2022-06-04 LAB — GLUCOSE, CAPILLARY: Glucose-Capillary: 161 mg/dL — ABNORMAL HIGH (ref 70–99)

## 2022-06-04 MED ORDER — TORSEMIDE 40 MG PO TABS: 40.0000 mg | ORAL_TABLET | Freq: Every day | ORAL | 1 refills | Status: DC

## 2022-06-04 MED ORDER — POTASSIUM CHLORIDE CRYS ER 20 MEQ PO TBCR
80.0000 meq | EXTENDED_RELEASE_TABLET | Freq: Once | ORAL | Status: AC
  Administered 2022-06-04: 80 meq via ORAL
  Filled 2022-06-04: qty 4

## 2022-06-04 MED ORDER — NITROGLYCERIN 0.4 MG SL SUBL: 0.4000 mg | SUBLINGUAL_TABLET | SUBLINGUAL | 12 refills | Status: DC | PRN

## 2022-06-04 MED ORDER — POTASSIUM CHLORIDE CRYS ER 20 MEQ PO TBCR: 40.0000 meq | EXTENDED_RELEASE_TABLET | ORAL | Status: DC

## 2022-06-04 MED ORDER — TORSEMIDE 20 MG PO TABS: 40.0000 mg | ORAL_TABLET | Freq: Every day | ORAL | Status: DC

## 2022-06-04 MED ORDER — POTASSIUM CHLORIDE CRYS ER 20 MEQ PO TBCR: 20.0000 meq | EXTENDED_RELEASE_TABLET | Freq: Every day | ORAL | 2 refills | Status: DC

## 2022-06-04 NOTE — Progress Notes (Signed)
.  vest   Station marker --D Measurement -38   ReDS Vest value-28%

## 2022-06-04 NOTE — Progress Notes (Addendum)
Rounding Note    Patient Name: Erik Black Date of Encounter: 06/04/2022  [facility] Cardiologist: Debbe Odea, MD / Dr. Gala Romney  Subjective   He is feeling significantly better with less shortness of breath.  No lower extremity edema.  He is -6 L and his weight is down about 6 kg.  Renal function is starting to worsen.  Inpatient Medications    Scheduled Meds:  aspirin EC  81 mg Oral BID   atorvastatin  40 mg Oral QHS   brimonidine  1 drop Both Eyes TID   carvedilol  25 mg Oral BID   cholecalciferol  2,000 Units Oral Daily   DULoxetine  60 mg Oral Daily   enoxaparin (LOVENOX) injection  0.5 mg/kg Subcutaneous Q24H   insulin aspart  0-5 Units Subcutaneous QHS   insulin aspart  0-9 Units Subcutaneous TID WC   latanoprost  1 drop Both Eyes QHS   mometasone-formoterol  2 puff Inhalation BID   And   umeclidinium bromide  1 puff Inhalation Daily   multivitamin with minerals  1 tablet Oral Daily   omega-3 acid ethyl esters  1,000 mg Oral TID PC   OXcarbazepine  150 mg Oral BID   pantoprazole  40 mg Oral Daily   sacubitril-valsartan  1 tablet Oral BID   spironolactone  25 mg Oral Daily   timolol  1 drop Both Eyes Daily   [START ON 06/05/2022] torsemide  40 mg Oral Daily   Continuous Infusions:  PRN Meds: acetaminophen, albuterol, dextromethorphan-guaiFENesin, fentaNYL (SUBLIMAZE) injection, hydrALAZINE, morphine injection, nitroGLYCERIN, ondansetron (ZOFRAN) IV   Vital Signs    Vitals:   06/03/22 2329 06/04/22 0310 06/04/22 0500 06/04/22 0721  BP: 133/78 139/80  (!) 129/100  Pulse: 68 77  65  Resp: 17 16  16   Temp: 98.6 F (37 C) 98.6 F (37 C)  97.8 F (36.6 C)  TempSrc: Oral Axillary  Oral  SpO2: 97% 98%  97%  Weight:   125.4 kg   Height:        Intake/Output Summary (Last 24 hours) at 06/04/2022 0847 Last data filed at 06/04/2022 0751 Gross per 24 hour  Intake 240 ml  Output 3050 ml  Net -2810 ml       06/04/2022    5:00 AM  06/03/2022    5:00 AM 06/01/2022    7:29 AM  Last 3 Weights  Weight (lbs) 276 lb 7.3 oz 288 lb 12.8 oz 291 lb  Weight (kg) 125.4 kg 131 kg 131.997 kg      Telemetry    Sinus rhythm.  No events - Personally Reviewed  ECG    N/a - Personally Reviewed  Physical Exam   VS:  BP (!) 129/100 (BP Location: Left Arm)   Pulse 65   Temp 97.8 F (36.6 C) (Oral)   Resp 16   Ht 6' (1.829 m)   Wt 125.4 kg   SpO2 97%   BMI 37.49 kg/m  , BMI Body mass index is 37.49 kg/m. GENERAL:  Well appearing HEENT: Pupils equal round and reactive, fundi not visualized, oral mucosa unremarkable NECK: No JVD.  , carotid upstroke brisk and symmetric, no bruits, no thyromegaly LUNGS:  Clear to auscultation bilaterally HEART:  RRR.  PMI not displaced or sustained,S1 and S2 within normal limits, no S3, no S4, no clicks, no rubs, no murmurs ABD:  Flat, positive bowel sounds normal in frequency in pitch, no bruits, no rebound, no guarding, no midline pulsatile mass,  no hepatomegaly, no splenomegaly EXT:  2 plus pulses throughout, no edema, no cyanosis no clubbing SKIN:  No rashes no nodules NEURO:  Cranial nerves II through XII grossly intact, motor grossly intact throughout [facility]:  Cognitively intact, oriented to person place and time   Labs    High Sensitivity Troponin:   Recent Labs  Lab 06/01/22 0739 06/01/22 1839 06/02/22 0504 06/03/22 1810  TROPONINIHS 14 16 15  16 13       Chemistry Recent Labs  Lab 06/01/22 0739 06/01/22 0805 06/02/22 0504 06/03/22 1810  NA  --  141 138 138  K  --  3.4* 3.5 2.9*  CL  --  105 101 97*  CO2  --  28 28 34*  GLUCOSE  --  162* 133* 125*  BUN  --  15 17 24*  CREATININE  --  1.37* 1.31* 1.51*  CALCIUM  --  9.2 9.0 10.0  MG 2.1  --  2.2 2.3  PROT  --  7.1  --   --   ALBUMIN  --  3.8  --   --   AST  --  16  --   --   ALT  --  11  --   --   ALKPHOS  --  113  --   --   BILITOT  --  1.5*  --   --   GFRNONAA  --  56* 59* 49*  ANIONGAP  --  8 9 7       Lipids  Recent Labs  Lab 06/02/22 0504  CHOL 134  TRIG 45  HDL 36*  LDLCALC 89  CHOLHDL 3.7     Hematology Recent Labs  Lab 06/01/22 0805  WBC 8.6  RBC 4.98  HGB 14.0  HCT 43.3  MCV 86.9  MCH 28.1  MCHC 32.3  RDW 13.8  PLT 132*    Thyroid No results for input(s): "TSH", "FREET4" in the last 168 hours.  BNP Recent Labs  Lab 06/01/22 0805  BNP 2,025.3*     DDimer  Recent Labs  Lab 06/01/22 1100  DDIMER 0.75*      Radiology    No results found.  Cardiac Studies   Echo 03/2022   1. Left ventricular ejection fraction, by estimation, is 30 to 35%. The  left ventricle has moderately decreased function. The left ventricle  demonstrates global hypokinesis. The left ventricular internal cavity size  was mildly dilated. Left ventricular  diastolic parameters were normal.   2. Right ventricular systolic function is normal. The right ventricular  size is normal.   3. Left atrial size was mildly dilated.   4. Right atrial size was mildly dilated.   5. The mitral valve is normal in structure. Mild mitral valve  regurgitation.   6. The aortic valve is normal in structure. Aortic valve regurgitation is  not visualized.    Cardiac cath 05/2022    Mid RCA to Dist RCA lesion is 30% stenosed.   Ost Cx to Prox Cx lesion is 55% stenosed.   Dist LAD lesion is 60% stenosed.   LV end diastolic pressure is severely elevated.   Hemodynamic findings consistent with moderate pulmonary hypertension.   There is no aortic valve stenosis.   Nonischemic Cardiomyopathy: Angiographically Moderate 3-vessel disease Acute on Chronic severe Combined Systolic and Diastolic Heart Failure-known EF of 30 to 35% /Cardiac Index of 2.25.  PCWP 38 mmHg, PA mean 41 mmHg, and LVEDP of 40 mmHg. Right Heart Cath Numbers: RAP mean  18 mmHg, RV-EDP 68/11-24 mmHg; PAP 69/39 mmHg-mean 41 mmHg; PCWP 38 to 41 mmHg. Ao sat 95%, PA sat 66%. Cardiac Output 5.59--Index 2.25    Patient Profile      [name] is a 87M with Chronic systolic diastolic heart failure (LVEF 30 to 35%), nonobstructive CAD, hypertension, OSA on CPAP, and morbid obesity admitted with acute on chronic systolic diastolic heart failure exacerbation.   Assessment & Plan    # Acute on chronic systolic and diastolic heart failure: LVEF 30-35%.  Recent hospitalization for heart failure.  He is feeling better with diuresis.  Volume overload improved significantly and he seems to be euvolemic.  Renal function is starting to worsen. I will hold IV furosemide and metolazone today. He was taking torsemide 20 mg daily at home and will increase the dose to 40 mg once daily to be resumed tomorrow. Continue treatment with carvedilol, Entresto and spironolactone.  He hasn't tolerated Jardiance.  The patient might be able to be discharged later today or tomorrow.  Will discuss with Dr. Allena Katz. He has an appointment scheduled with Dr. Gala Romney on 12/14.   # PVCs: He has very frequent PVCs.  ?PVC cardiomyopathy.  Zio in place.  Consider switching carvedilol to metoprolol.  # Hypertension: BP controlled.  Continue Entresto, spironolactone, and carvedilol.   # Non-obstructive CAD:  # Hyperlipidemia: Patient notes atypical CP.  Check troponin.  Continue carvedilol, aspirin and statin.  # OSA:  Continue CPAP.    For questions or updates, please contact [facility] Please consult [email or web address] for contact info under        Signed, Lorine Bears, MD  06/04/2022, 8:47 AM

## 2022-06-04 NOTE — Discharge Summary (Signed)
Physician Discharge Summary   Patient: Erik Black MRN: [id number] DOB: 21-Nov-1950  Admit date:     06/01/2022  Discharge date: 06/04/22  Discharge Physician: Erik Black   PCP: [facility]   Recommendations at discharge:    F/u Erik Black on 06/14/22 F/u pcp in 1-2 weeks  Discharge Diagnoses: Principal Problem:   Acute on chronic systolic CHF (congestive heart failure) (Erik Black) Active Problems:   CAD in native artery   Chest pain of uncertain etiology   Type II diabetes mellitus with renal manifestations (Erik Black)   Hypertension   HLD (hyperlipidemia)   Depression with anxiety   Sleep apnea   Morbid obesity (Erik Black)   Hypokalemia   Stage 3 chronic kidney disease (Erik Black)   Positive D dimer   PVC (premature ventricular contraction)  Hospital Course:   Erik Black is a 71 y.o. male with medical history significant of sCHF with EF of 30 to 35%, CAD, HTN, HLD, DM, OSA on CPAP, CKD-3a, depression with anxiety, obesity, who presents with shortness breath and chest pain.    Patient has shortness of breath for more than 2 days, which has been progressively worsening.   Acute on chronic systolic CHF (congestive heart failure) Erik Black): Patient has positive JVD, crackles on auscultation, leg edema, elevated BNP 2025, pulmonary edema chest x-ray, clinically consistent with CHF exacerbation.  2D echo on 03/07/2022 showed EF 30-35%. --seen by Erik. Haroldine Black of [facility]  -Patient received 80 mg of Lasix in ED - received lasix 40 mg bid  now with metolazone 2.5 x 2 days.  - Continue Entresto to 97/103 bid - Continue carvedilol  - Increased spiro to 25 daily by Erik. Haroldine Black --good uop -Low salt diet -Fluid restriction --per Erik Erik Black change to po torsemide 40 mg qd from 12/5  --give KCL 80 meq today --pt on RA  CAD (coronary artery disease) and chest pain: Troponin 14. D-dimer positive 0.75.   -ASA and lipitor -trend trop -f/u CTA  no PE, + pulm edema and LE doppler neg for  DVT -prn nitro    Type II diabetes mellitus with renal manifestations (Erik Black):  --A1c  7.0 recently.  Patient is taking semaglutide at [facility] -SSI   Hypertension -IV hydralazine as needed -Patient is on Coreg, Entresto   HLD (hyperlipidemia) -Lipitor   Depression with anxiety -Continue Erik Black medications   Sleep apnea -CPAP   Morbid obesity (Erik Black) -Healthy diet and exercise -Encourage to lose weight   Hypokalemia: Potassium 3.4 - magnesium level --> 2.1   Chronic kidney disease, stage 3a (Erik Black): Stable --creat 1.5--holding diuretics today       DVT ppx: SQ Lovenox   Code Status: Full code per pt and his wife    Family Communication: Yes, patient's  wife at bed side.   Disposition Plan:  Anticipate discharge back to previous environment   Consults called: Erik Black   Erik Black to discharge Erik Black with outpt Erik Black cards f/u 06/14/22     Dispo: The patient is from: Erik Black              Anticipated d/c is to: Erik Black              Anticipated d/c date is today   Disposition: Erik Black Diet recommendation:  Discharge Diet Orders (From admission, onward)     Start     Ordered   06/04/22 0000  Diet - low sodium heart healthy        06/04/22 0956  Cardiac diet DISCHARGE MEDICATION: Allergies as of 06/04/2022       Reactions   Empagliflozin Other (See Comments)   Yeast infection Other Reaction(s): Bleeding skin   Methadone    Oxycodone Itching   Hands started peeling and "could not swallow"   Lisinopril Cough        Medication List     STOP taking these medications    Simbrinza 1-0.2 % Susp Generic drug: Brinzolamide-Brimonidine       TAKE these medications    aspirin EC 81 MG tablet Take 1 tablet (81 mg total) by mouth 2 (two) times daily.   atorvastatin 40 MG tablet Commonly known as: LIPITOR Take 1 tablet (40 mg total) by mouth at bedtime.   Breztri Aerosphere 160-9-4.8 MCG/ACT Aero Generic drug: Budeson-Glycopyrrol-Formoterol Inhale into  the lungs.   brimonidine 0.2 % ophthalmic solution Commonly known as: ALPHAGAN Place 1 drop into both eyes 3 (three) times daily.   carvedilol 25 MG tablet Commonly known as: COREG Take 1 tablet (25 mg total) by mouth 2 (two) times daily.   DULoxetine 60 MG capsule Commonly known as: CYMBALTA Take 60 mg by mouth daily.   latanoprost 0.005 % ophthalmic solution Commonly known as: XALATAN Place 1 drop into both eyes at bedtime.   multivitamin with minerals tablet Take 1 tablet by mouth daily.   nitroGLYCERIN 0.4 MG SL tablet Commonly known as: NITROSTAT Place 1 tablet (0.4 mg total) under the tongue every 5 (five) minutes as needed for chest pain.   Omega-3 1000 MG Caps Take 2,000 mg by mouth 3 (three) times daily after meals.   omeprazole 20 MG capsule Commonly known as: PRILOSEC Take 20 mg by mouth 2 (two) times daily.   OXcarbazepine 150 MG tablet Commonly known as: TRILEPTAL Take 150 mg by mouth 2 (two) times daily.   potassium chloride SA 20 MEQ tablet Commonly known as: KLOR-CON M Take 1 tablet (20 mEq total) by mouth daily.   sacubitril-valsartan 97-103 MG Commonly known as: ENTRESTO Take 1 tablet by mouth 2 (two) times daily.   Semaglutide(0.25 or 0.5MG /DOS) 2 MG/3ML Sopn Inject 0.25 mg into the skin once a week.   spironolactone 25 MG tablet Commonly known as: ALDACTONE Take 1 tablet (25 mg total) by mouth daily.   timolol 0.5 % ophthalmic gel-forming Commonly known as: TIMOPTIC-XR Place 1 drop into both eyes daily.   Torsemide 40 MG Tabs Take 40 mg by mouth daily. Start taking on: June 05, 2022 What changed:  medication strength how much to take   Vitamin D 50 MCG (2000 UT) tablet Take 4,000 Units by mouth daily.        Follow-up Information     [facility]. Schedule an appointment as soon as possible for a visit in 1 week(s).   Specialty: General Practice Contact information: [address] [place]  [id number] [phone]         Dolores Patty, MD Follow up on 06/14/2022.   Specialty: [place] Why: fo r CHF f/u Contact information: [address] [place] [address] [phone]                Discharge Exam: Erik Black Weights   06/01/22 0729 06/03/22 0500 06/04/22 0500  Weight: 132 kg 131 kg 125.4 kg     Condition at discharge: fair  The results of significant diagnostics from this hospitalization (including imaging, microbiology, ancillary and laboratory) are listed below for reference.   Imaging Studies: US  Venous Img Lower Bilateral (DVT)  Result Date: 06/01/2022 CLINICAL DATA:  Positive D-dimer EXAM: BILATERAL LOWER EXTREMITY VENOUS DOPPLER ULTRASOUND TECHNIQUE: Gray-scale sonography with graded compression, as well as color Doppler and duplex ultrasound were performed to evaluate the lower extremity deep venous systems from the level of the common femoral vein and including the common femoral, femoral, profunda femoral, popliteal and calf veins including the posterior tibial, peroneal and gastrocnemius veins when visible. The superficial great saphenous vein was also interrogated. Spectral Doppler was utilized to evaluate flow at rest and with distal augmentation maneuvers in the common femoral, femoral and popliteal veins. COMPARISON:  None Available. FINDINGS: RIGHT LOWER EXTREMITY Common Femoral Vein: No evidence of thrombus. Normal compressibility, respiratory phasicity and response to augmentation. Saphenofemoral Junction: No evidence of thrombus. Normal compressibility and flow on color Doppler imaging. Profunda Femoral Vein: No evidence of thrombus. Normal compressibility and flow on color Doppler imaging. Femoral Vein: No evidence of thrombus. Normal compressibility, respiratory phasicity and response to augmentation. Popliteal Vein: No evidence of thrombus. Normal compressibility, respiratory phasicity and response to augmentation. Calf  Veins: No evidence of thrombus. Normal compressibility and flow on color Doppler imaging. Superficial Great Saphenous Vein: No evidence of thrombus. Normal compressibility. Venous Reflux:  None. Other Findings:  None. LEFT LOWER EXTREMITY Common Femoral Vein: No evidence of thrombus. Normal compressibility, respiratory phasicity and response to augmentation. Saphenofemoral Junction: No evidence of thrombus. Normal compressibility and flow on color Doppler imaging. Profunda Femoral Vein: No evidence of thrombus. Normal compressibility and flow on color Doppler imaging. Femoral Vein: No evidence of thrombus. Normal compressibility, respiratory phasicity and response to augmentation. Popliteal Vein: No evidence of thrombus. Normal compressibility, respiratory phasicity and response to augmentation. Calf Veins: No evidence of thrombus. Normal compressibility and flow on color Doppler imaging. Superficial Great Saphenous Vein: No evidence of thrombus. Normal compressibility. Venous Reflux:  None. Other Findings:  None. IMPRESSION: No evidence of deep venous thrombosis in either lower extremity. Electronically Signed   By: Ronney Asters M.D.   On: 06/01/2022 20:08   CT Angio Chest Pulmonary Embolism (PE) W or WO Contrast  Result Date: 06/01/2022 CLINICAL DATA:  High probability for PE. Shortness of breath with chest pain. EXAM: CT ANGIOGRAPHY CHEST WITH CONTRAST TECHNIQUE: Multidetector CT imaging of the chest was performed using the standard protocol during bolus administration of intravenous contrast. Multiplanar CT image reconstructions and MIPs were obtained to evaluate the vascular anatomy. RADIATION DOSE REDUCTION: This exam was performed according to the departmental dose-optimization program which includes automated exposure control, adjustment of the mA and/or kV according to patient size and/or use of iterative reconstruction technique. CONTRAST:  71mL OMNIPAQUE IOHEXOL 350 MG/ML SOLN COMPARISON:  None  Available. FINDINGS: Cardiovascular: Satisfactory opacification of the pulmonary arteries to the segmental level. No evidence of pulmonary embolism. Heart is mildly enlarged. No pericardial effusion. There are atherosclerotic calcifications of the aorta and coronary arteries. Mediastinum/Nodes: There is a hypodense left thyroid nodule measuring 17 mm. There are numerous nonenlarged mediastinal and hilar lymph nodes. There is an enlarged subcarinal lymph node measuring 13 mm short axis. Visualized esophagus is within normal limits. Lungs/Pleura: There are small bilateral pleural effusions, left greater than right. There are patchy ground-glass opacities throughout both lungs with some smooth interlobular septal thickening. There also some ill-defined ground-glass nodular densities measuring 4 mm or less in the left upper lobe. There is some central peribronchial wall thickening bilaterally. Large bulla noted in the right upper lobe measuring 12 x 6 x 10  cm. Upper Abdomen: No acute abnormality. Musculoskeletal: Degenerative changes affect the spine. Review of the MIP images confirms the above findings. IMPRESSION: 1. No evidence for pulmonary embolism. 2. Small bilateral pleural effusions. 3. Patchy ground-glass opacities throughout both lungs with smooth interlobular septal thickening. Findings are favored as pulmonary edema. Atypical infection can not be excluded. 4. There are some ill-defined ground-glass nodular densities in the left upper lobe measuring 4 mm or less. These may be infectious/inflammatory. Follow-up chest CT recommended in 3-6 months. 5. Central peribronchial wall thickening worrisome for bronchitis. 6. Mild mediastinal lymphadenopathy, likely reactive. 7. 17 mm left thyroid nodule. Nonemergent thyroid ultrasound recommended. Aortic Atherosclerosis (ICD10-I70.0). Electronically Signed   By: Ronney Asters M.D.   On: 06/01/2022 19:16   DG Chest 2 View  Result Date: 06/01/2022 CLINICAL DATA:   Chest pain EXAM: CHEST - 2 VIEW COMPARISON:  Radiograph 02/05/2022 FINDINGS: Unchanged cardiomediastinal silhouette. Electronic device overlies the left upper chest. There are diffuse interstitial opacities and lower lung predominant airspace opacities with small pleural effusions. Bibasilar atelectasis. No evidence of pneumothorax. No acute osseous abnormality. Thoracic spondylosis. IMPRESSION: Mild-to-moderate pulmonary edema with small pleural effusions and bibasilar atelectasis. Electronically Signed   By: Maurine Simmering M.D.   On: 06/01/2022 08:01   MR CARDIAC MORPHOLOGY W WO CONTRAST  Result Date: 05/23/2022 CLINICAL DATA:  Non ischemic cardiomyopathy, evaluate EF, etiology of cardiomyopathy EXAM: CARDIAC MRI TECHNIQUE: The patient was scanned on a 1.5 Tesla Siemens magnet. A dedicated cardiac coil was used. Functional imaging was done using Fiesta sequences. 2,3, and 4 chamber views were done to assess for RWMA's. Modified Simpson's rule using a short axis stack was used to calculate an ejection fraction on a dedicated work Conservation officer, nature. The patient received 16 cc of Gadavist. After 10 minutes inversion recovery sequences were used to assess for infiltration and scar tissue. Velocity flow mapping performed CONTRAST:  16 cc  of Gadavist FINDINGS: 1. Mild-moderately dilated left ventricular size, severely reduced LV systolic function (LVEF = 25%). There is global hypokinesis There is late gadolinium enhancement in the left ventricular myocardium in the mid wall at the right ventricular insertion sites. LVEDV: 277 ml LVESV: 210 ml SV: 67 ml CO: 3L/min Myocardial mass: 179g 2. Normal right ventricular size, moderately reduced systolic function. there is global hypokinesis 3.  Normal left and right atrial size. 4. Normal size of the aortic root, ascending aorta and pulmonary artery. Pulmonary flow velocity mapping measurements not accurate. 5.  No significant valvular abnormalities. 6.  Normal  pericardium.  No pericardial effusion. IMPRESSION: 1.  Severely reduced LV function, LVEF = 25% 2.  Mild-moderately dilated LV size. 3. Mid wall Left ventricular LGE (scar) at the right ventricular insertion points. 4.  Moderately reduced RV function. 5.  No significant valvular abnormalities. 6.  Findings consistent with nonischemic dilated cardiomyopathy. Electronically Signed   By: Kate Sable M.D.   On: 05/23/2022 14:02   MR CARDIAC VELOCITY FLOW MAP  Result Date: 05/23/2022 CLINICAL DATA:  Non ischemic cardiomyopathy, evaluate EF, etiology of cardiomyopathy EXAM: CARDIAC MRI TECHNIQUE: The patient was scanned on a 1.5 Tesla Siemens magnet. A dedicated cardiac coil was used. Functional imaging was done using Fiesta sequences. 2,3, and 4 chamber views were done to assess for RWMA's. Modified Simpson's rule using a short axis stack was used to calculate an ejection fraction on a dedicated work Conservation officer, nature. The patient received 16 cc of Gadavist. After 10 minutes inversion recovery sequences were  used to assess for infiltration and scar tissue. Velocity flow mapping performed CONTRAST:  16 cc  of Gadavist FINDINGS: 1. Mild-moderately dilated left ventricular size, severely reduced LV systolic function (LVEF = 25%). There is global hypokinesis There is late gadolinium enhancement in the left ventricular myocardium in the mid wall at the right ventricular insertion sites. LVEDV: 277 ml LVESV: 210 ml SV: 67 ml CO: 3L/min Myocardial mass: 179g 2. Normal right ventricular size, moderately reduced systolic function. there is global hypokinesis 3.  Normal left and right atrial size. 4. Normal size of the aortic root, ascending aorta and pulmonary artery. Pulmonary flow velocity mapping measurements not accurate. 5.  No significant valvular abnormalities. 6.  Normal pericardium.  No pericardial effusion. IMPRESSION: 1.  Severely reduced LV function, LVEF = 25% 2.  Mild-moderately dilated LV  size. 3. Mid wall Left ventricular LGE (scar) at the right ventricular insertion points. 4.  Moderately reduced RV function. 5.  No significant valvular abnormalities. 6.  Findings consistent with nonischemic dilated cardiomyopathy. Electronically Signed   By: Kate Sable M.D.   On: 05/23/2022 14:02    Microbiology: Results for orders placed or performed during the hospital encounter of 11/19/19  SARS CORONAVIRUS 2 (TAT 6-24 HRS) Nasopharyngeal Nasopharyngeal Swab     Status: None   Collection Time: 11/19/19 12:38 PM   Specimen: Nasopharyngeal Swab  Result Value Ref Range Status   SARS Coronavirus 2 NEGATIVE NEGATIVE Final    Comment: (NOTE) SARS-CoV-2 target nucleic acids are NOT DETECTED. The SARS-CoV-2 RNA is generally detectable in upper and lower respiratory specimens during the acute phase of infection. Negative results do not preclude SARS-CoV-2 infection, do not rule out co-infections with other pathogens, and should not be used as the sole basis for treatment or other patient management decisions. Negative results must be combined with clinical observations, patient history, and epidemiological information. The expected result is Negative. Fact Sheet for Patients: SugarRoll.be Fact Sheet for Healthcare Providers: https://www.woods-mathews.com/ This test is not yet approved or cleared by the [place] FDA and  has been authorized for detection and/or diagnosis of SARS-CoV-2 by FDA under an Emergency Use Authorization (EUA). This EUA will remain  in effect (meaning this test can be used) for the duration of the COVID-19 declaration under Section 56 4(b)(1) of the Act, 21 U.S.C. section 360bbb-3(b)(1), unless the authorization is terminated or revoked sooner. Performed at [facility], [place] [address] [place], [place] [address]     Labs: CBC: Recent Labs  Lab 06/01/22 0805  WBC 8.6  HGB 14.0  HCT 43.3  MCV 86.9   PLT [id number]*   Basic Metabolic Panel: Recent Labs  Lab 06/01/22 0739 06/01/22 0805 06/02/22 0504 06/03/22 1810  NA  --  141 138 138  K  --  3.4* 3.5 2.9*  CL  --  105 101 97*  CO2  --  28 28 34*  GLUCOSE  --  162* 133* 125*  BUN  --  15 17 24*  CREATININE  --  1.37* 1.31* 1.51*  CALCIUM  --  9.2 9.0 10.0  MG 2.1  --  2.2 2.3   Liver Function Tests: Recent Labs  Lab 06/01/22 0805  AST 16  ALT 11  ALKPHOS 113  BILITOT 1.5*  PROT 7.1  ALBUMIN 3.8   CBG: Recent Labs  Lab 06/03/22 0851 06/03/22 1148 06/03/22 1659 06/03/22 2136 06/04/22 0722  GLUCAP 124* 181* 127* 162* 161*    Discharge time spent: greater than  30 minutes.  Signed: Fritzi Mandes, MD [facility] 06/04/2022

## 2022-06-06 ENCOUNTER — Other Ambulatory Visit (HOSPITAL_COMMUNITY): Payer: Self-pay | Admitting: *Deleted

## 2022-06-06 MED ORDER — TORSEMIDE 20 MG PO TABS: 40.0000 mg | ORAL_TABLET | Freq: Every day | ORAL | 3 refills | Status: DC

## 2022-06-14 ENCOUNTER — Other Ambulatory Visit: Payer: Self-pay | Admitting: Internal Medicine

## 2022-06-14 ENCOUNTER — Encounter
Admission: RE | Admit: 2022-06-14 | Discharge: 2022-06-14 | Disposition: A | Discharge status: Home / Self Care | Payer: Medicare HMO | Source: Ambulatory Visit | Attending: Internal Medicine | Admitting: Internal Medicine

## 2022-06-14 ENCOUNTER — Ambulatory Visit (HOSPITAL_BASED_OUTPATIENT_CLINIC_OR_DEPARTMENT_OTHER): Payer: Medicare HMO | Admitting: Internal Medicine

## 2022-06-14 ENCOUNTER — Encounter: Payer: Self-pay | Admitting: Internal Medicine

## 2022-06-14 VITALS — BP 137/91 | HR 85 | Resp 20 | Wt 285.0 lb

## 2022-06-14 DIAGNOSIS — I493 Ventricular premature depolarization: Secondary | ICD-10-CM | POA: Insufficient documentation

## 2022-06-14 DIAGNOSIS — I428 Other cardiomyopathies: Secondary | ICD-10-CM | POA: Insufficient documentation

## 2022-06-14 DIAGNOSIS — R079 Chest pain, unspecified: Secondary | ICD-10-CM | POA: Diagnosis not present

## 2022-06-14 DIAGNOSIS — R0602 Shortness of breath: Secondary | ICD-10-CM | POA: Insufficient documentation

## 2022-06-14 DIAGNOSIS — Z01812 Encounter for preprocedural laboratory examination: Secondary | ICD-10-CM | POA: Insufficient documentation

## 2022-06-14 DIAGNOSIS — Z87891 Personal history of nicotine dependence: Secondary | ICD-10-CM | POA: Diagnosis not present

## 2022-06-14 DIAGNOSIS — G4733 Obstructive sleep apnea (adult) (pediatric): Secondary | ICD-10-CM | POA: Insufficient documentation

## 2022-06-14 DIAGNOSIS — I10 Essential (primary) hypertension: Secondary | ICD-10-CM | POA: Diagnosis not present

## 2022-06-14 DIAGNOSIS — Z79899 Other long term (current) drug therapy: Secondary | ICD-10-CM | POA: Diagnosis not present

## 2022-06-14 DIAGNOSIS — I2510 Atherosclerotic heart disease of native coronary artery without angina pectoris: Secondary | ICD-10-CM | POA: Insufficient documentation

## 2022-06-14 DIAGNOSIS — I5022 Chronic systolic (congestive) heart failure: Secondary | ICD-10-CM

## 2022-06-14 DIAGNOSIS — I110 Hypertensive heart disease with heart failure: Secondary | ICD-10-CM | POA: Diagnosis not present

## 2022-06-14 LAB — BASIC METABOLIC PANEL
Anion gap: 9 (ref 5–15)
BUN: 23 mg/dL (ref 8–23)
CO2: 28 mmol/L (ref 22–32)
Calcium: 9.2 mg/dL (ref 8.9–10.3)
Chloride: 102 mmol/L (ref 98–111)
Creatinine, Ser: 1.41 mg/dL — ABNORMAL HIGH (ref 0.61–1.24)
GFR, Estimated: 54 mL/min — ABNORMAL LOW (ref >60)
Glucose, Bld: 141 mg/dL — ABNORMAL HIGH (ref 70–99)
Potassium: 3.4 mmol/L — ABNORMAL LOW (ref 3.5–5.1)
Sodium: 139 mmol/L (ref 135–145)

## 2022-06-14 LAB — BRAIN NATRIURETIC PEPTIDE: B Natriuretic Peptide: 1613.6 pg/mL — ABNORMAL HIGH (ref 0.0–100.0)

## 2022-06-14 LAB — MAGNESIUM: Magnesium: 1.8 mg/dL (ref 1.7–2.4)

## 2022-06-14 MED ORDER — MEXILETINE HCL 200 MG PO CAPS: 200.0000 mg | ORAL_CAPSULE | Freq: Two times a day (BID) | ORAL | 3 refills | Status: DC

## 2022-06-14 NOTE — Addendum Note (Signed)
Addended by: Noralee Space on: 06/14/2022 01:00 PM   Modules accepted: Orders

## 2022-06-14 NOTE — Patient Instructions (Signed)
Medication Changes:  START Mexiletine 200 mg Twice daily   Lab Work:  Labs done today, your results will be available in MyChart, we will contact you for abnormal readings.  Testing/Procedures:  none  Referrals:  You have been referred to Dr Jomarie Longs, genetic counselor at [facility], her office will call you for an appointment  Special Instructions // Education:  Do the following things EVERYDAY: Weigh yourself in the morning before breakfast. Write it down and keep it in a log. Take your medicines as prescribed Eat low salt foods--Limit salt (sodium) to 2000 mg per day.  Stay as active as you can everyday Limit all fluids for the day to less than 2 liters   Follow-Up in: 4-6 weeks    If you have any questions or concerns before your next appointment please send [place] a message through mychart or call our office at [phone] Monday-Friday 8 am-5 pm.   If you have an urgent need after hours on the weekend please call your Primary Cardiologist or the [facility] in [place] [address] at [phone].

## 2022-06-14 NOTE — Progress Notes (Signed)
ReDS Vest / Clip - 06/14/22 1200       ReDS Vest / Clip   Station Marker D    Ruler Value 38    ReDS Value Range Low volume    ReDS Actual Value 27

## 2022-06-14 NOTE — Progress Notes (Signed)
ADVANCED HF CLINIC NOTE  Referring Physician: Debbe Odea, MD  Primary Care: [facility] Primary Cardiologist: Debbe Odea, MD  HPI:  Erik Black is a 71 y.o. male with morbid obesity, HTN, OSA on CPAP, DM2 HFrEF EF 30 to 35%, diabetes, former smoker  Diagnosed with HF in 9/23. Echo 9/23 EF 30-35%. Started lasix. Referred to [facility]   Admitted 11/23 with ADHF cath as below. Treated with IV lasix and GDMT titrated. Diuresed 22 pounds.   Cath: 05/03/22  LAD 60%d LCx 55% prox RCA 30%m  RA 18 RV 68/15 PA 69/39 (41) PCWP 40  Fick 5.6/2.3  Readmitted last week with HF and marked volume overload. Diuresed with IV lasix and metolazone.   cMRI: 11/23 EF 25% mid wall LGE strip c/w NICM. RV moderately down.   Zio 11/23: Occasional NSVT 13.7% PVCs (one predominant morphology 13.6%)  Here with his wife. Breathing better. But says he feels like impending doom. Occasional sharp CP. Remains SOB with mild exertion. No edema, orthopnea or PND. Taking torsemide 40 daily. Weight at beginning of week 277 by end of the week it creeps up to 281. Watching his fluid intake closely. Keeps fluid intake < 2L. Last week took extra torsemide and weight came down   FHx - Mother with HF - 50 Brothers with HF - 1 with ICD - Father with MI     Past Medical History:  Diagnosis Date   Anxiety    Arthritis    CHF (congestive heart failure) (HCC)    Depression    Diabetes mellitus without complication (HCC)    GERD (gastroesophageal reflux disease)    Glaucoma    both eyes   Hypertension    Sleep apnea    uses Cpap nightly    Current Outpatient Medications  Medication Sig Dispense Refill   aspirin EC 81 MG tablet Take 1 tablet (81 mg total) by mouth 2 (two) times daily. 84 tablet 0   atorvastatin (LIPITOR) 40 MG tablet Take 1 tablet (40 mg total) by mouth at bedtime. 90 tablet 3   BREZTRI AEROSPHERE 160-9-4.8 MCG/ACT AERO Inhale into the lungs.     brimonidine  (ALPHAGAN) 0.2 % ophthalmic solution Place 1 drop into both eyes 3 (three) times daily.     carvedilol (COREG) 25 MG tablet Take 1 tablet (25 mg total) by mouth 2 (two) times daily. 60 tablet 1   Cholecalciferol (VITAMIN D) 50 MCG (2000 UT) tablet Take 4,000 Units by mouth daily.      DULoxetine (CYMBALTA) 60 MG capsule Take 60 mg by mouth daily.     latanoprost (XALATAN) 0.005 % ophthalmic solution Place 1 drop into both eyes at bedtime.     Multiple Vitamins-Minerals (MULTIVITAMIN WITH MINERALS) tablet Take 1 tablet by mouth daily.     nitroGLYCERIN (NITROSTAT) 0.4 MG SL tablet Place 1 tablet (0.4 mg total) under the tongue every 5 (five) minutes as needed for chest pain. 20 tablet 12   Omega-3 1000 MG CAPS Take 2,000 mg by mouth 3 (three) times daily after meals.     omeprazole (PRILOSEC) 20 MG capsule Take 20 mg by mouth 2 (two) times daily.     OXcarbazepine (TRILEPTAL) 150 MG tablet Take 150 mg by mouth 2 (two) times daily.     potassium chloride SA (KLOR-CON M) 20 MEQ tablet Take 1 tablet (20 mEq total) by mouth daily. 90 tablet 2   sacubitril-valsartan (ENTRESTO) 97-103 MG Take 1 tablet by mouth 2 (two) times  daily. 60 tablet 2   Semaglutide,0.25 or 0.5MG /DOS, 2 MG/3ML SOPN Inject 0.25 mg into the skin once a week. 3 mL 5   spironolactone (ALDACTONE) 25 MG tablet Take 1 tablet (25 mg total) by mouth daily. 30 tablet 1   timolol (TIMOPTIC-XR) 0.5 % ophthalmic gel-forming Place 1 drop into both eyes daily.     torsemide (DEMADEX) 20 MG tablet Take 2 tablets (40 mg total) by mouth daily. 60 tablet 3   No current facility-administered medications for this visit.    Allergies  Allergen Reactions   Empagliflozin Other (See Comments)    Yeast infection  Other Reaction(s): Bleeding skin   Methadone    Oxycodone Itching    Hands started peeling and "could not swallow"   Lisinopril Cough      Social History   Socioeconomic History   Marital status: Married    Spouse name: Marcie Bal    Number of children: 0   Years of education: Not on file   Highest education level: Not on file  Occupational History   Not on file  Tobacco Use   Smoking status: Former    Packs/day: 0.10    Types: Cigarettes    Quit date: 05/26/2019    Years since quitting: 3.0   Smokeless tobacco: Never   Tobacco comments:    smokes a pack a month  Vaping Use   Vaping Use: Never used  Substance and Sexual Activity   Alcohol use: Not Currently   Drug use: Not Currently   Sexual activity: Not on file  Other Topics Concern   Not on file  Social History Narrative   Lives at home with wife Marcie Bal and a dog.   Social Determinants of Health   Financial Resource Strain: Not on file  Food Insecurity: No Food Insecurity (06/01/2022)   Hunger Vital Sign    Worried About Running Out of Food in the Last Year: Never true    Ran Out of Food in the Last Year: Never true  Transportation Needs: No Transportation Needs (06/01/2022)   PRAPARE - Hydrologist (Medical): No    Lack of Transportation (Non-Medical): No  Physical Activity: Not on file  Stress: Not on file  Social Connections: Not on file  Intimate Partner Violence: Not At Risk (06/01/2022)   Humiliation, Afraid, Rape, and Kick questionnaire    Fear of Current or Ex-Partner: No    Emotionally Abused: No    Physically Abused: No    Sexually Abused: No      Family History  Problem Relation Age of Onset   Hypertension Mother    Diabetes Mother    Heart disease Mother    Heart disease Father    Heart disease Brother     Vitals:   06/14/22 1129  BP: (!) 137/91  Pulse: 85  Resp: 20  SpO2: 99%  Weight: 285 lb (129.3 kg)    PHYSICAL EXAM: General:  Well appearing. No resp difficulty HEENT: normal Neck: supple. no JVD. Carotids 2+ bilat; no bruits. No lymphadenopathy or thryomegaly appreciated. Cor: PMI nondisplaced. Regular rate & rhythm. No rubs, gallops or murmurs.+ PVCs Lungs: clear Abdomen: obese  soft, nontender, nondistended. No hepatosplenomegaly. No bruits or masses. Good bowel sounds. Extremities: no cyanosis, clubbing, rash, edema Neuro: alert & orientedx3, cranial nerves grossly intact. moves all 4 extremities w/o difficulty. Affect pleasant   ASSESSMENT & PLAN:   1. Chronic systolic HF - Echocardiogram 03/2022 EF 30 to 35% -  Cath 11/23 non-obstructive CAD - Admit 11/23 with marked volume overload.  - cMRI: 11/23 EF 25% mid wall LGE strip c/w NICM. RV moderately down.  - Zio 11/23: Occasional NSVT 13.7% PVCs (one predominant morphology 13.6%) - Suspect PVC CM  - NYHA III Volume status looks good on torsemdie 40 daily ReDS 27% Takes extra as needed - Continue Entresto to 97/103 bid - Continue carvedilol 25 bid - Continue spiro 25 daily - Patient did not tolerate Jardiance in the past, developed yeast infection - Suspect PVC CM vs familial (LMNA)  No evidence of infiltrative process on MRI  - Genetics eval  2. Frequent PVCs - Zio 11/23: Occasional NSVT 13.7% PVCs (one predominant morphology 13.6%) - start mexilitene 200 bid for suppression   3. HTN - Blood pressure mildly elevated. Meds as above  4. OSA - compliant with CPAP  5. Morbid obesity  - continue GLP-1 RA  Glori Bickers, MD  12:19 PM

## 2022-06-15 ENCOUNTER — Other Ambulatory Visit (HOSPITAL_COMMUNITY): Payer: Self-pay

## 2022-06-15 ENCOUNTER — Telehealth (HOSPITAL_COMMUNITY): Payer: Self-pay | Admitting: Pharmacy Technician

## 2022-06-15 ENCOUNTER — Ambulatory Visit: Payer: No Typology Code available for payment source | Admitting: Cardiology

## 2022-06-15 NOTE — Telephone Encounter (Signed)
Prior authorization has been submitted.

## 2022-06-15 NOTE — Telephone Encounter (Signed)
Patient Advocate Encounter   Received notification from [facility] that prior authorization for Mexiletine is required.   PA submitted on CoverMyMeds Key [id number] Status is pending   Will continue to follow.

## 2022-06-15 NOTE — Addendum Note (Signed)
Encounter addended by: Darolyn Double G, RN on: 06/15/2022 10:39 AM  Actions taken: Imaging Exam ended

## 2022-06-18 ENCOUNTER — Other Ambulatory Visit (HOSPITAL_COMMUNITY): Payer: Self-pay

## 2022-06-18 NOTE — Telephone Encounter (Signed)
Advanced Heart Failure Patient Advocate Encounter  Prior Authorization for Mexiletine has been approved.   Effective: 06/15/22 to 07/02/23 Determination letter has been added to patient chart.  Burnell Blanks, CPhT Rx Patient Advocate Phone: [phone]

## 2022-06-18 NOTE — Telephone Encounter (Signed)
Advanced Heart Failure Patient Advocate Encounter  30 day co-pay, $0.  Archer Asa, CPhT

## 2022-06-19 ENCOUNTER — Telehealth: Payer: Self-pay | Admitting: *Deleted

## 2022-06-19 DIAGNOSIS — I5022 Chronic systolic (congestive) heart failure: Secondary | ICD-10-CM

## 2022-06-19 MED ORDER — POTASSIUM CHLORIDE CRYS ER 20 MEQ PO TBCR: 40.0000 meq | EXTENDED_RELEASE_TABLET | Freq: Every day | ORAL | 2 refills | Status: DC

## 2022-06-19 NOTE — Telephone Encounter (Signed)
-----   Message from Dolores Patty, MD sent at 06/18/2022  4:46 PM EST ----- Start kcl 20 daily. Repeat BMET next week please

## 2022-06-19 NOTE — Telephone Encounter (Signed)
Spoke w/pt, he is aware, agreeable, and verbalized understanding, he is already taking KCL 20 meq daily so will increase to 40 meq daily, lab order placed for next week.

## 2022-06-26 ENCOUNTER — Other Ambulatory Visit
Admission: RE | Admit: 2022-06-26 | Discharge: 2022-06-26 | Disposition: A | Discharge status: Home / Self Care | Payer: Medicare HMO | Attending: Internal Medicine | Admitting: Internal Medicine

## 2022-06-26 DIAGNOSIS — I5022 Chronic systolic (congestive) heart failure: Secondary | ICD-10-CM | POA: Diagnosis present

## 2022-06-26 LAB — BASIC METABOLIC PANEL
Anion gap: 10 (ref 5–15)
BUN: 14 mg/dL (ref 8–23)
CO2: 26 mmol/L (ref 22–32)
Calcium: 9.5 mg/dL (ref 8.9–10.3)
Chloride: 106 mmol/L (ref 98–111)
Creatinine, Ser: 1.47 mg/dL — ABNORMAL HIGH (ref 0.61–1.24)
GFR, Estimated: 51 mL/min — ABNORMAL LOW (ref >60)
Glucose, Bld: 141 mg/dL — ABNORMAL HIGH (ref 70–99)
Potassium: 3.7 mmol/L (ref 3.5–5.1)
Sodium: 142 mmol/L (ref 135–145)

## 2022-06-26 NOTE — Addendum Note (Signed)
Addended by: Erling Conte on: 06/26/2022 07:17 AM   Modules accepted: Orders

## 2022-06-27 ENCOUNTER — Telehealth: Payer: Self-pay | Admitting: Cardiology

## 2022-06-27 NOTE — Telephone Encounter (Signed)
New Message:    Patient is having an Echo tomorrow. He wants to know if he can take his medicine and eat before his Echo?

## 2022-06-27 NOTE — Telephone Encounter (Signed)
Pt wife made aware pt ok to take medication and eat prior to ECHO.

## 2022-06-28 ENCOUNTER — Ambulatory Visit: Payer: Medicare HMO | Attending: Cardiology

## 2022-06-28 DIAGNOSIS — I428 Other cardiomyopathies: Secondary | ICD-10-CM

## 2022-06-28 LAB — ECHOCARDIOGRAM COMPLETE
AR max vel: 2.94 cm2
AV Area VTI: 2.87 cm2
AV Area mean vel: 2.97 cm2
AV Mean grad: 3 mmHg
AV Peak grad: 5.3 mmHg
Ao pk vel: 1.15 m/s
Area-P 1/2: 5.38 cm2
S' Lateral: 4.7 cm

## 2022-06-28 MED ORDER — PERFLUTREN LIPID MICROSPHERE
1.0000 mL | INTRAVENOUS | Status: AC | PRN
  Administered 2022-06-28: 2 mL via INTRAVENOUS

## 2022-07-02 ENCOUNTER — Other Ambulatory Visit (HOSPITAL_COMMUNITY): Payer: Self-pay | Admitting: Internal Medicine

## 2022-07-10 ENCOUNTER — Telehealth: Payer: Self-pay

## 2022-07-10 DIAGNOSIS — I5022 Chronic systolic (congestive) heart failure: Secondary | ICD-10-CM

## 2022-07-10 MED ORDER — SPIRONOLACTONE 25 MG PO TABS: 25.0000 mg | ORAL_TABLET | Freq: Every day | ORAL | 4 refills | Status: DC

## 2022-07-10 MED ORDER — CARVEDILOL 25 MG PO TABS: 25.0000 mg | ORAL_TABLET | Freq: Two times a day (BID) | ORAL | 4 refills | Status: DC

## 2022-07-10 NOTE — Telephone Encounter (Signed)
Requested refills for spironolactone and carvedilol to be sent to [facility] on [address]. Patient is completely out or carvedilol. Refills sent. Scheduled follow up appt for 08/01/22 at 10 AM.

## 2022-07-11 ENCOUNTER — Other Ambulatory Visit (HOSPITAL_COMMUNITY): Payer: Self-pay | Admitting: *Deleted

## 2022-07-11 DIAGNOSIS — I5022 Chronic systolic (congestive) heart failure: Secondary | ICD-10-CM

## 2022-07-11 MED ORDER — SPIRONOLACTONE 25 MG PO TABS: 25.0000 mg | ORAL_TABLET | Freq: Every day | ORAL | 6 refills | Status: DC

## 2022-07-24 ENCOUNTER — Ambulatory Visit: Payer: Medicare HMO | Attending: Genetic Counselor | Admitting: Genetic Counselor

## 2022-07-24 DIAGNOSIS — I428 Other cardiomyopathies: Secondary | ICD-10-CM

## 2022-07-30 NOTE — Progress Notes (Signed)
Pre test Genetic Consult  Referring Provider: Glori Bickers, MD   Referral Reason  Erik Black was referred for genetic consult and testing for Nonischemic cardiomyopathy (NICM) in light of his reduced EF and scarring indicative of NICM as well as the significant family history of heart failure and sudden death in his family.  [name] (III.3 on pedigree), a pleasant retired 72 year-old [place] American gentleman is here today with his wife, Erik Black. He tells me that his PCP at the [place] took off his diuretic in March, 2023 because of low K levels. In Aug, 2023 he began experiencing difficulty breathing and increased fluid accumulation. In September, he woke up one night with chest tightness and having difficulty exhaling. He went to the ER where, he reports, having 2 liters of water drained from him. He was referred to the heart failure clinic and placed on a diuretic. He had a normal heart cath, and was later hospitalized. He was then referred to Dr. Haroldine Black and a cMRI detected EF of 25% and mid-wall scarring indicative of NICM.   He tells me that in his youth he was told that he had rheumatic fever. He reports having an "enlarged heart" at birth. But this was not detected when he was in the [place] at his routine physicals.  Traditional Risk Factors Erik Black denies having other cardiac or systemic conditions that can cause non-ischemic cardiomyopathy, namely, ischemic heart disease, myocarditis, infiltrative myocardial disease (amyloidosis, sarcoidosis, hemochromatosis), infection with HIV virus, connective tissue disease (such as systemic lupus erythematosus etc.). Also denies substance abuse (chronic alcohol abuse, cocaine abuse), doxorubicin therapy and of having other cardiovascular diseases (valvular heart disease, HCM).  He reports having HTN at 80 that is now well-controlled with medication.  Family history    Relation to Proband Pedigree # Current age  Heart condition/age of onset Notes  Children none           Brother-1 III.1 Deceased Symptomatic for HF @ 62 Died of HF @ 43, 3 months after symptoms developed  Brother-2 III.2 Deceased Heart disease- details? Died of heart disease and prostate cancer @ 71s  Brother-3 III.4 Deceased Heart disease- details? Pacemaker for 15 years Died of heart disease, colon and pancreatic cancer @ 35, G.I system shut down  Brother-4 III.5 Deceased None Died of colon and pancreatic cancer @ 43  Nephews and nieces IV.1-IV.15 IV.2, IV.12-Deceased 40-30s None IV.2- died @ 61s - severe diabetes IV.12- died @ 38 of suspected M.I at home, suspect illegal substance abuse, autopsy done- results not known        Father II.4 Deceased unaware Died suddenly while using the bathroom- suspected M.I., asthma  Paternal aunt II.1 Deceased unaware Died of diabetes @ 68s  Paternal uncles II.2-II.3 Deceased unaware Both died of M.I. @ 9, and 85  Paternal grandfather 1.1 Deceased Unaware Died @? -old age   Paternal grandmother I.2 Deceased Unaware Died @? -old age        Mother II.5 Deceased ? Died of HF @ 53  Maternal Aunts, Uncles II.6-II.64 Deceased All w/ heart issues All died of heart issues and diabetes   Maternal grandfather I.3 Deceased Unaware Died @ 13 of M.I.   Maternal grandmother I.4 Deceased  Died of cancer @ 49s    Pre-test Genetic Consult notes  I reviewed the different genetic cardiomyopathies that are considered non-ischemic cardiomyopathy, namely ARVC, DCM, HCM and LVNC. I discussed the genetics of cardiomyopathies, namely inheritance, incomplete penetrance, variable expression and digenic/compound mutations  that can be seen in some patients. We walked through the process of genetic testing. I explained to the patient that there are three possible outcomes of genetic testing; namely positive, negative and finding a variant of unknown significance. A positive outcome can be expected in cases that do not have  risk factors for a cardiomyopathy, present early in life with increased severity and have a family history of sudden cardiac death and/or a relative that has been diagnosed with cardiomyopathy. Limitations in current genetic testing methodology can produce a negative result. Variants of unknown significance (VUS) are also observed and are so classified if the variant is not well understood as very few individuals have been reported to harbor this variant or its role in gene function has not been elucidated. The potential outcomes of genetic testing and subsequent management of at-risk family members were discussed so as to manage expectations.   Impression and Plan In summary, Erik Black presents with heart failure at age 30. He also has a dramatic family history of sudden death (father) and heart failure in mother and an older brother as well as a family history of heart disease in his maternal relatives and two brothers though he does not the exact details of their heart condition.     Genetic testing would be appropriate if he had first-degree relatives that would benefit from this test. However, he has no children and living siblings that would directly benefit from the test. Considering that his health insurance classifies this test as medically unnecessary and that there are no living first-degree relatives that would benefit from the test, Erik Black declines genetic testing. He will speak with his nephews and nieces so that they can pursue appropriate cardiac screening.  Erik Black, Ph.D, [facility] Clinical Molecular Geneticist

## 2022-07-30 NOTE — Progress Notes (Unsigned)
ADVANCED HF CLINIC NOTE  Referring Physician: Kate Sable, MD  Primary Care: [place] Primary Cardiologist: Kate Sable, MD  HPI:  Erik Black is a 72 y.o. male with morbid obesity, HTN, OSA on CPAP, DM2 HFrEF EF 30 to 35%, diabetes, former smoker  Diagnosed with HF in 9/23. Echo 9/23 EF 30-35%. Started lasix. Referred to [facility]   Admitted 11/23 with ADHF cath as below. Treated with IV lasix and GDMT titrated. Diuresed 22 pounds.   Cath: 05/03/22  LAD 60%d LCx 55% prox RCA 30%m  RA 18 RV 68/15 PA 69/39 (41) PCWP 40  Fick 5.6/2.3  Readmitted last week with HF and marked volume overload. Diuresed with IV lasix and metolazone.   cMRI: 11/23 EF 25% mid wall LGE strip c/w NICM. RV moderately down.   Zio 11/23: Occasional NSVT 13.7% PVCs (one predominant morphology 13.6%)  Here with his wife. Says he feels like his fluid "is regulated". Says he feels like his chest is squeezing at times. Can happen at any time. Not severe. Feels like a squeeze. No SOB or nausea accompanying it. No edema, orthopnea or PND. Takes torsemide 40 daily and will take extra tab as needed. (Not that often)   FHx - Mother with HF - 73 Brothers with HF - 1 with ICD - Father with MI     Past Medical History:  Diagnosis Date   Anxiety    Arthritis    CHF (congestive heart failure) ([place])    Depression    Diabetes mellitus without complication (HCC)    GERD (gastroesophageal reflux disease)    Glaucoma    both eyes   Hypertension    Sleep apnea    uses Cpap nightly    Current Outpatient Medications  Medication Sig Dispense Refill   aspirin EC 81 MG tablet Take 1 tablet (81 mg total) by mouth 2 (two) times daily. 84 tablet 0   atorvastatin (LIPITOR) 40 MG tablet Take 1 tablet (40 mg total) by mouth at bedtime. 90 tablet 3   BREZTRI AEROSPHERE 160-9-4.8 MCG/ACT AERO Inhale into the lungs.     brimonidine (ALPHAGAN) 0.2 % ophthalmic solution Place 1 drop into both  eyes 3 (three) times daily.     carvedilol (COREG) 25 MG tablet Take 1 tablet (25 mg total) by mouth 2 (two) times daily. 60 tablet 4   Cholecalciferol (VITAMIN D) 50 MCG (2000 UT) tablet Take 4,000 Units by mouth daily.      DULoxetine (CYMBALTA) 60 MG capsule Take 60 mg by mouth daily.     latanoprost (XALATAN) 0.005 % ophthalmic solution Place 1 drop into both eyes at bedtime.     mexiletine (MEXITIL) 200 MG capsule TAKE 1 CAPSULE BY MOUTH TWICE A DAY 60 capsule 3   Multiple Vitamins-Minerals (MULTIVITAMIN WITH MINERALS) tablet Take 1 tablet by mouth daily.     Omega-3 1000 MG CAPS Take 2,000 mg by mouth 3 (three) times daily after meals.     omeprazole (PRILOSEC) 20 MG capsule Take 20 mg by mouth 2 (two) times daily.     OXcarbazepine (TRILEPTAL) 150 MG tablet Take 150 mg by mouth 2 (two) times daily.     potassium chloride SA (KLOR-CON M) 20 MEQ tablet Take 2 tablets (40 mEq total) by mouth daily. 90 tablet 2   sacubitril-valsartan (ENTRESTO) 97-103 MG Take 1 tablet by mouth 2 (two) times daily. 60 tablet 2   Semaglutide,0.25 or 0.5MG /DOS, 2 MG/3ML SOPN Inject 0.25 mg into the skin  once a week. 3 mL 5   spironolactone (ALDACTONE) 25 MG tablet Take 1 tablet (25 mg total) by mouth daily. 30 tablet 6   timolol (TIMOPTIC-XR) 0.5 % ophthalmic gel-forming Place 1 drop into both eyes daily.     torsemide (DEMADEX) 20 MG tablet TAKE 2 TABLETS (40 MG TOTAL) BY MOUTH DAILY. (Patient taking differently: Take 40 mg by mouth daily. Takes 60mg  when weight goes up at home.) 180 tablet 1   No current facility-administered medications for this visit.    Allergies  Allergen Reactions   Empagliflozin Other (See Comments)    Yeast infection  Other Reaction(s): Bleeding skin   Methadone    Oxycodone Itching    Hands started peeling and "could not swallow"   Lisinopril Cough      Social History   Socioeconomic History   Marital status: Married    Spouse name: Marcie Bal   Number of children: 0    Years of education: Not on file   Highest education level: Not on file  Occupational History   Not on file  Tobacco Use   Smoking status: Former    Packs/day: 0.10    Types: Cigarettes    Quit date: 05/26/2019    Years since quitting: 3.1   Smokeless tobacco: Never   Tobacco comments:    smokes a pack a month  Vaping Use   Vaping Use: Never used  Substance and Sexual Activity   Alcohol use: Not Currently   Drug use: Not Currently   Sexual activity: Not on file  Other Topics Concern   Not on file  Social History Narrative   Lives at home with wife Marcie Bal and a dog.   Social Determinants of Health   Financial Resource Strain: Not on file  Food Insecurity: No Food Insecurity (06/01/2022)   Hunger Vital Sign    Worried About Running Out of Food in the Last Year: Never true    Ran Out of Food in the Last Year: Never true  Transportation Needs: No Transportation Needs (06/01/2022)   PRAPARE - Hydrologist (Medical): No    Lack of Transportation (Non-Medical): No  Physical Activity: Not on file  Stress: Not on file  Social Connections: Not on file  Intimate Partner Violence: Not At Risk (06/01/2022)   Humiliation, Afraid, Rape, and Kick questionnaire    Fear of Current or Ex-Partner: No    Emotionally Abused: No    Physically Abused: No    Sexually Abused: No      Family History  Problem Relation Age of Onset   Hypertension Mother    Diabetes Mother    Heart disease Mother    Heart disease Father    Heart disease Brother     Vitals:   08/01/22 1004 08/01/22 1047  BP: 134/70 105/73  Pulse: 70   SpO2: 99%   Weight: 283 lb 3.2 oz (128.5 kg)     PHYSICAL EXAM: General:  Well appearing. No resp difficulty HEENT: normal Neck: supple. no JVD. Carotids 2+ bilat; no bruits. No lymphadenopathy or thryomegaly appreciated. Cor: PMI nondisplaced. Regular rate & rhythm. Occasional ectopy No rubs, gallops or murmurs. Lungs: clear Abdomen: obese  soft, nontender, nondistended. No hepatosplenomegaly. No bruits or masses. Good bowel sounds. Extremities: no cyanosis, clubbing, rash, edema Neuro: alert & orientedx3, cranial nerves grossly intact. moves all 4 extremities w/o difficulty. Affect pleasant  ECG:  NSR 81 no PVCs Personally reviewed   ASSESSMENT & PLAN:  1. Chronic systolic HF - Echocardiogram 03/2022 EF 30 to 35% - Cath 11/23 non-obstructive CAD - Admit 11/23 with marked volume overload.  - cMRI: 11/23 EF 25% mid wall LGE strip c/w NICM. RV moderately down.  - Zio 11/23: Occasional NSVT 13.7% PVCs (one predominant morphology 13.6%) - Suspect PVC CM  - Improved  NYHA II Volume status looks good on torsemdie 40 daily - Continue Entresto to 97/103 bid - Continue carvedilol 25 bid - Continue spiro 25 daily - Patient did not tolerate Jardiance in the past, developed yeast infection - Suspect PVC CM vs familial (LMNA)  No evidence of infiltrative process on MRI  - Has seen Dr. Jomarie Longs and decided not to test - Plan repeat echo in 4-6 weeks to see if EF improving with PVC suppression If no improvement will repeat Zio to ensure PVC adequately addressed  2. Frequent PVCs - Zio 11/23: Occasional NSVT 13.7% PVCs (one predominant morphology 13.6%) - Continue mexilitene 200 bid for suppression  - PVCs completely suppressed on ECG today  3. HTN - Blood pressure mildly elevated. Meds as above  4. OSA - compliant with CPAP  5. Morbid obesity  - continue GLP-1 RA  Arvilla Meres, MD  10:47 AM

## 2022-08-01 ENCOUNTER — Ambulatory Visit (HOSPITAL_BASED_OUTPATIENT_CLINIC_OR_DEPARTMENT_OTHER): Payer: Medicare HMO | Admitting: Internal Medicine

## 2022-08-01 ENCOUNTER — Encounter: Payer: Self-pay | Admitting: Internal Medicine

## 2022-08-01 ENCOUNTER — Other Ambulatory Visit
Admission: RE | Admit: 2022-08-01 | Discharge: 2022-08-01 | Disposition: A | Discharge status: Home / Self Care | Payer: Medicare HMO | Source: Ambulatory Visit | Attending: Internal Medicine | Admitting: Internal Medicine

## 2022-08-01 VITALS — BP 105/73 | HR 70 | Wt 283.2 lb

## 2022-08-01 DIAGNOSIS — I10 Essential (primary) hypertension: Secondary | ICD-10-CM

## 2022-08-01 DIAGNOSIS — I5020 Unspecified systolic (congestive) heart failure: Secondary | ICD-10-CM | POA: Insufficient documentation

## 2022-08-01 DIAGNOSIS — I493 Ventricular premature depolarization: Secondary | ICD-10-CM

## 2022-08-01 DIAGNOSIS — I2510 Atherosclerotic heart disease of native coronary artery without angina pectoris: Secondary | ICD-10-CM

## 2022-08-01 LAB — BASIC METABOLIC PANEL
Anion gap: 8 (ref 5–15)
BUN: 16 mg/dL (ref 8–23)
CO2: 28 mmol/L (ref 22–32)
Calcium: 9.2 mg/dL (ref 8.9–10.3)
Chloride: 103 mmol/L (ref 98–111)
Creatinine, Ser: 1.38 mg/dL — ABNORMAL HIGH (ref 0.61–1.24)
GFR, Estimated: 55 mL/min — ABNORMAL LOW (ref >60)
Glucose, Bld: 146 mg/dL — ABNORMAL HIGH (ref 70–99)
Potassium: 3.4 mmol/L — ABNORMAL LOW (ref 3.5–5.1)
Sodium: 139 mmol/L (ref 135–145)

## 2022-08-01 LAB — BRAIN NATRIURETIC PEPTIDE: B Natriuretic Peptide: 412.4 pg/mL — ABNORMAL HIGH (ref 0.0–100.0)

## 2022-08-01 MED ORDER — ATORVASTATIN CALCIUM 40 MG PO TABS: 40.0000 mg | ORAL_TABLET | Freq: Every day | ORAL | 3 refills | Status: AC

## 2022-08-01 NOTE — Addendum Note (Signed)
Addended by: Julianne Handler on: 08/01/2022 11:06 AM   Modules accepted: Orders

## 2022-08-01 NOTE — Addendum Note (Signed)
Addended by: Julianne Handler on: 08/01/2022 11:59 AM   Modules accepted: Orders

## 2022-08-03 ENCOUNTER — Telehealth: Payer: Self-pay

## 2022-08-03 DIAGNOSIS — I5022 Chronic systolic (congestive) heart failure: Secondary | ICD-10-CM

## 2022-08-03 MED ORDER — POTASSIUM CHLORIDE CRYS ER 20 MEQ PO TBCR: 20.0000 meq | EXTENDED_RELEASE_TABLET | ORAL | 3 refills | Status: DC

## 2022-08-03 NOTE — Telephone Encounter (Addendum)
  Spoke w pt's wife Olyver Hawes regarding lab result and provider increasing potassium from 47meq to 2meq daily. New prescription sent and BMET order added. Pt will come to lab Thursday 2/8 for lab work.  Pt aware, agreeable, and verbalized understanding.     ----- Message from Jolaine Artist, MD sent at 08/03/2022 11:50 AM EST ----- Please increase potassium for 40 daily to 60 daily. Recheck BMET 1 week

## 2022-08-09 ENCOUNTER — Encounter: Payer: Self-pay | Admitting: Cardiology

## 2022-08-09 ENCOUNTER — Ambulatory Visit: Payer: Medicare HMO | Attending: Cardiology | Admitting: Cardiology

## 2022-08-09 ENCOUNTER — Other Ambulatory Visit
Admission: RE | Admit: 2022-08-09 | Discharge: 2022-08-09 | Disposition: A | Discharge status: Home / Self Care | Payer: Medicare HMO | Attending: Internal Medicine | Admitting: Internal Medicine

## 2022-08-09 VITALS — BP 158/94 | HR 75 | Ht 72.0 in | Wt 284.0 lb

## 2022-08-09 DIAGNOSIS — I428 Other cardiomyopathies: Secondary | ICD-10-CM | POA: Diagnosis not present

## 2022-08-09 DIAGNOSIS — E7800 Pure hypercholesterolemia, unspecified: Secondary | ICD-10-CM

## 2022-08-09 DIAGNOSIS — I10 Essential (primary) hypertension: Secondary | ICD-10-CM | POA: Diagnosis not present

## 2022-08-09 DIAGNOSIS — I5022 Chronic systolic (congestive) heart failure: Secondary | ICD-10-CM | POA: Diagnosis present

## 2022-08-09 DIAGNOSIS — I2510 Atherosclerotic heart disease of native coronary artery without angina pectoris: Secondary | ICD-10-CM

## 2022-08-09 DIAGNOSIS — R079 Chest pain, unspecified: Secondary | ICD-10-CM

## 2022-08-09 LAB — BASIC METABOLIC PANEL
Anion gap: 7 (ref 5–15)
BUN: 22 mg/dL (ref 8–23)
CO2: 26 mmol/L (ref 22–32)
Calcium: 9.3 mg/dL (ref 8.9–10.3)
Chloride: 103 mmol/L (ref 98–111)
Creatinine, Ser: 1.44 mg/dL — ABNORMAL HIGH (ref 0.61–1.24)
GFR, Estimated: 52 mL/min — ABNORMAL LOW (ref >60)
Glucose, Bld: 175 mg/dL — ABNORMAL HIGH (ref 70–99)
Potassium: 3.5 mmol/L (ref 3.5–5.1)
Sodium: 136 mmol/L (ref 135–145)

## 2022-08-09 NOTE — Patient Instructions (Signed)
Medication Instructions:   Your physician recommends that you continue on your current medications as directed. Please refer to the Current Medication list given to you today.  *If you need a refill on your cardiac medications before your next appointment, please call your pharmacy*   Lab Work:  None Ordered  If you have labs (blood work) drawn today and your tests are completely normal, you will receive your results only by: MyChart Message (if you have MyChart) OR A paper copy in the mail If you have any lab test that is abnormal or we need to change your treatment, we will call you to review the results.   Testing/Procedures:  None Ordered   Follow-Up: At [facility], you and your health needs are our priority.  As part of our continuing mission to provide you with exceptional heart care, we have created designated Provider Care Teams.  These Care Teams include your primary Cardiologist (physician) and Advanced Practice Providers (APPs -  Physician Assistants and Nurse Practitioners) who all work together to provide you with the care you need, when you need it.  We recommend signing up for the patient portal called "MyChart".  Sign up information is provided on this After Visit Summary.  MyChart is used to connect with patients for Virtual Visits (Telemedicine).  Patients are able to view lab/test results, encounter notes, upcoming appointments, etc.  Non-urgent messages can be sent to your provider as well.   To learn more about what you can do with MyChart, go to [email or web address].    Your next appointment:   6 month(s)  Provider:   You may see Brian Agbor-Etang, MD or one of the following Advanced Practice Providers on your designated Care Team:   Christopher Berge, NP Ryan Dunn, PA-C Cadence Furth, PA-C Sheri Hammock, NP 

## 2022-08-09 NOTE — Progress Notes (Signed)
Cardiology Office Note:    Date:  08/09/2022   ID:  Erik Black, DOB 1951-05-29, MRN [id number]  PCP:  [facility]   [facility] Providers Cardiologist:  Debbe Odea, MD     Referring MD: [facility]   Chief Complaint  Patient presents with   Follow-up    3-4 month f/u, ongoing chest paint    History of Present Illness:    Erik Black is a 72 y.o. male with a hx of nonobstructive CAD ( LHC -05/2022-60% mid to distal LAD, 55% proximal left circumflex, 30% mid RCA), NICM EF 30 to 35%, diabetes, former smoker who presents for follow-up.    Recently saw heart failure service, mexiletine was started for PVC suppression.  Tolerating medications as prescribed, no adverse effects.  Denies edema, shortness of breath.  Endorses left-sided chest pain, which he describes as soreness.  States lifting some boxes days ago.  Chest pain reproducible with palpation.  Blood pressure well-controlled at home with systolic around 118.   Prior notes Left heart cath 05/2022 60% mid to distal LAD, 55% proximal left circumflex, 30% mid RCA. Echocardiogram 03/2022 EF 30 to 35% CMR 05/2022 EF 25%, LV mid wall LGE at RV insertion points. Cardiac monitor 06/2022 frequent PVCs 13.7%  Past Medical History:  Diagnosis Date   Anxiety    Arthritis    CHF (congestive heart failure) (HCC)    Depression    Diabetes mellitus without complication (HCC)    GERD (gastroesophageal reflux disease)    Glaucoma    both eyes   Hypertension    Sleep apnea    uses Cpap nightly    Past Surgical History:  Procedure Laterality Date   CARPAL TUNNEL RELEASE Bilateral    COLONOSCOPY     EYE SURGERY Bilateral    cataract surgery with lens implants   KNEE ARTHROPLASTY Right    KNEE ARTHROPLASTY Left    KNEE ARTHROSCOPY Right    KNEE ARTHROSCOPY Left    MULTIPLE TOOTH EXTRACTIONS     RIGHT/LEFT HEART CATH AND CORONARY ANGIOGRAPHY Bilateral 05/03/2022   Procedure: RIGHT/LEFT  HEART CATH AND CORONARY ANGIOGRAPHY;  Surgeon: Marykay Lex, MD;  Location: [facility] INVASIVE CV LAB;  Service: Cardiovascular;  Laterality: Bilateral;   ROTATOR CUFF REPAIR Right    ROTATOR CUFF REPAIR W/ DISTAL CLAVICLE EXCISION Left    TOTAL HIP ARTHROPLASTY Right 05/11/2019   Procedure: RIGHT TOTAL HIP ARTHROPLASTY ANTERIOR APPROACH;  Surgeon: Tarry Kos, MD;  Location: [facility] OR;  Service: Orthopedics;  Laterality: Right;   TOTAL HIP ARTHROPLASTY Left 11/23/2019   TOTAL HIP ARTHROPLASTY Left 11/23/2019   Procedure: LEFT TOTAL HIP ARTHROPLASTY ANTERIOR APPROACH;  Surgeon: Tarry Kos, MD;  Location: [facility] OR;  Service: Orthopedics;  Laterality: Left;   VASECTOMY      Current Medications: Current Meds  Medication Sig   aspirin EC 81 MG tablet Take 1 tablet (81 mg total) by mouth 2 (two) times daily.   atorvastatin (LIPITOR) 40 MG tablet Take 1 tablet (40 mg total) by mouth at bedtime.   BREZTRI AEROSPHERE 160-9-4.8 MCG/ACT AERO Inhale into the lungs.   brimonidine (ALPHAGAN) 0.2 % ophthalmic solution Place 1 drop into both eyes 3 (three) times daily.   carvedilol (COREG) 25 MG tablet Take 1 tablet (25 mg total) by mouth 2 (two) times daily.   Cholecalciferol (VITAMIN D) 50 MCG (2000 UT) tablet Take 4,000 Units by mouth daily.    docusate sodium (COLACE) 100 MG capsule  Take 100 mg by mouth daily as needed.   DULoxetine (CYMBALTA) 60 MG capsule Take 60 mg by mouth daily.   latanoprost (XALATAN) 0.005 % ophthalmic solution Place 1 drop into both eyes at bedtime.   mexiletine (MEXITIL) 200 MG capsule TAKE 1 CAPSULE BY MOUTH TWICE A DAY   Multiple Vitamins-Minerals (MULTIVITAMIN WITH MINERALS) tablet Take 1 tablet by mouth daily.   Omega-3 1000 MG CAPS Take 2,000 mg by mouth 3 (three) times daily after meals.   omeprazole (PRILOSEC) 20 MG capsule Take 20 mg by mouth 2 (two) times daily.   OXcarbazepine (TRILEPTAL) 150 MG tablet Take 150 mg by mouth 2 (two) times daily.   potassium chloride SA  (KLOR-CON M) 20 MEQ tablet Take 1-2 tablets (20-40 mEq total) by mouth as directed. Take two tablets (40 mEq) daily in the morning, and take one tablet (20 mEq) daily in the evening.   sacubitril-valsartan (ENTRESTO) 97-103 MG Take 1 tablet by mouth 2 (two) times daily.   Semaglutide,0.25 or 0.5MG /DOS, 2 MG/3ML SOPN Inject 0.25 mg into the skin once a week.   spironolactone (ALDACTONE) 25 MG tablet Take 1 tablet (25 mg total) by mouth daily.   timolol (TIMOPTIC-XR) 0.5 % ophthalmic gel-forming Place 1 drop into both eyes daily.   torsemide (DEMADEX) 20 MG tablet TAKE 2 TABLETS (40 MG TOTAL) BY MOUTH DAILY. (Patient taking differently: Take 40 mg by mouth daily. Takes 60mg  when weight goes up at home.)     Allergies:   Empagliflozin, Methadone, Oxycodone, and Lisinopril   Social History   Socioeconomic History   Marital status: Married    Spouse name: Erik Black   Number of children: 0   Years of education: Not on file   Highest education level: Not on file  Occupational History   Not on file  Tobacco Use   Smoking status: Former    Packs/day: 0.10    Types: Cigarettes    Quit date: 05/26/2019    Years since quitting: 3.2   Smokeless tobacco: Never   Tobacco comments:    smokes a pack a month  Vaping Use   Vaping Use: Never used  Substance and Sexual Activity   Alcohol use: Not Currently   Drug use: Not Currently   Sexual activity: Not on file  Other Topics Concern   Not on file  Social History Narrative   Lives at home with wife Erik Black and a dog.   Social Determinants of Health   Financial Resource Strain: Not on file  Food Insecurity: No Food Insecurity (06/01/2022)   Hunger Vital Sign    Worried About Running Out of Food in the Last Year: Never true    Ran Out of Food in the Last Year: Never true  Transportation Needs: No Transportation Needs (06/01/2022)   PRAPARE - Hydrologist (Medical): No    Lack of Transportation (Non-Medical): No   Physical Activity: Not on file  Stress: Not on file  Social Connections: Not on file     Family History: The patient's family history includes Diabetes in his mother; Heart disease in his brother, father, and mother; Hypertension in his mother.  ROS:   Please see the history of present illness.     All other systems reviewed and are negative.  EKGs/Labs/Other Studies Reviewed:    The following studies were reviewed today:   EKG:  EKG is  ordered today.  The ekg ordered today demonstrates sinus rhythm.  Recent Labs: 06/01/2022:  ALT 11; Hemoglobin 14.0; Platelets 132 06/14/2022: Magnesium 1.8 08/01/2022: B Natriuretic Peptide 412.4; BUN 16; Creatinine, Ser 1.38; Potassium 3.4; Sodium 139  Recent Lipid Panel    Component Value Date/Time   CHOL 134 06/02/2022 0504   TRIG 45 06/02/2022 0504   HDL 36 (L) 06/02/2022 0504   CHOLHDL 3.7 06/02/2022 0504   VLDL 9 06/02/2022 0504   LDLCALC 89 06/02/2022 0504     Risk Assessment/Calculations:   HYPERTENSION CONTROL Vitals:   08/09/22 0754 08/09/22 0759  BP: (!) 158/94 (!) 158/94    The patient's blood pressure is elevated above target today.  In order to address the patient's elevated BP: Blood pressure will be monitored at home to determine if medication changes need to be made.            Physical Exam:    VS:  BP (!) 158/94 (BP Location: Left Arm)   Pulse 75   Ht 6' (1.829 m)   Wt 284 lb (128.8 kg)   SpO2 98%   BMI 38.52 kg/m     Wt Readings from Last 3 Encounters:  08/09/22 284 lb (128.8 kg)  08/01/22 283 lb 3.2 oz (128.5 kg)  06/14/22 285 lb (129.3 kg)     GEN:  Well nourished, well developed in no acute distress HEENT: Normal NECK: No JVD; No carotid bruits CARDIAC: RRR, no murmurs, rubs, gallops RESPIRATORY: Diminished breath sounds at bases, no wheezing ABDOMEN: Soft, non-tender, non-distended MUSCULOSKELETAL:  No edema; chest wall tender on palpation SKIN: Warm and dry NEUROLOGIC:  Alert and  oriented x 3 PSYCHIATRIC:  Normal affect   ASSESSMENT:    1. NICM (nonischemic cardiomyopathy) ([place])   2. Coronary artery disease involving native coronary artery of native heart without angina pectoris   3. Primary hypertension   4. Pure hypercholesterolemia   5. Chest pain of uncertain etiology    PLAN:    In order of problems listed above:  Nonischemic cardiomyopathy EF 30 to 35%.  Possibly PVC induced.  On mexiletine.  Appears euvolemic.  Continue Coreg 25 mg twice daily, Entresto 97-103 mg twice daily, Aldactone 25 mg daily.  Torsemide 20 mg daily.  Appreciate input from heart failure service.  Keep follow-up appointment with advanced heart failure clinic.  Patient did not tolerate Jardiance in the past, developed yeast infection. Nonobstructive CAD, chest pain musculoskeletal.  Continue aspirin 81 mg, Lipitor 40 mg daily Hypertension, BP elevated today, controlled at home.  Continue Aldactone, Coreg, Entresto as above. Hyperlipidemia, LDL not at goal, increase Lipitor to 40 mg daily Musculoskeletal chest pain.  Likely from lifting objects.  Reassured.  Follow-up in 6 months     Medication Adjustments/Labs and Tests Ordered: Current medicines are reviewed at length with the patient today.  Concerns regarding medicines are outlined above.  Orders Placed This Encounter  Procedures   EKG 12-Lead   No orders of the defined types were placed in this encounter.   Patient Instructions  Medication Instructions:   Your physician recommends that you continue on your current medications as directed. Please refer to the Current Medication list given to you today.  *If you need a refill on your cardiac medications before your next appointment, please call your pharmacy*   Lab Work:  None Ordered  If you have labs (blood work) drawn today and your tests are completely normal, you will receive your results only by: [place] (if you have MyChart) OR A paper copy in the  mail If you have  any lab test that is abnormal or we need to change your treatment, we will call you to review the results.   Testing/Procedures:  None Ordered   Follow-Up: At [facility], you and your health needs are our priority.  As part of our continuing mission to provide you with exceptional heart care, we have created designated Provider Care Teams.  These Care Teams include your primary Cardiologist (physician) and Advanced Practice Providers (APPs -  Physician Assistants and Nurse Practitioners) who all work together to provide you with the care you need, when you need it.  We recommend signing up for the patient portal called "MyChart".  Sign up information is provided on this After Visit Summary.  MyChart is used to connect with patients for Virtual Visits (Telemedicine).  Patients are able to view lab/test results, encounter notes, upcoming appointments, etc.  Non-urgent messages can be sent to your provider as well.   To learn more about what you can do with MyChart, go to [email or web address].    Your next appointment:   6 month(s)  Provider:   You may see Kate Sable, MD or one of the following Advanced Practice Providers on your designated Care Team:   Murray Hodgkins, NP Christell Faith, PA-C Cadence Kathlen Mody, PA-C Gerrie Nordmann, NP    Signed, Kate Sable, MD  08/09/2022 8:59 AM    [place]

## 2022-08-14 ENCOUNTER — Other Ambulatory Visit: Payer: Self-pay

## 2022-08-14 ENCOUNTER — Encounter: Payer: Medicare HMO | Admitting: Genetic Counselor

## 2022-08-14 MED ORDER — SACUBITRIL-VALSARTAN 97-103 MG PO TABS: 1.0000 | ORAL_TABLET | Freq: Two times a day (BID) | ORAL | 6 refills | Status: DC

## 2022-09-03 ENCOUNTER — Other Ambulatory Visit: Payer: Self-pay | Admitting: Internal Medicine

## 2022-09-11 ENCOUNTER — Other Ambulatory Visit
Admission: RE | Admit: 2022-09-11 | Discharge: 2022-09-11 | Disposition: A | Discharge status: Home / Self Care | Payer: Medicare HMO | Source: Ambulatory Visit | Attending: Internal Medicine | Admitting: Internal Medicine

## 2022-09-11 ENCOUNTER — Encounter: Payer: Medicare HMO | Admitting: Internal Medicine

## 2022-09-11 ENCOUNTER — Encounter: Payer: Self-pay | Admitting: Internal Medicine

## 2022-09-11 ENCOUNTER — Ambulatory Visit (HOSPITAL_BASED_OUTPATIENT_CLINIC_OR_DEPARTMENT_OTHER): Payer: Medicare HMO | Admitting: Internal Medicine

## 2022-09-11 ENCOUNTER — Ambulatory Visit
Admission: RE | Admit: 2022-09-11 | Discharge: 2022-09-11 | Disposition: A | Discharge status: Home / Self Care | Payer: Medicare HMO | Source: Ambulatory Visit | Attending: Internal Medicine | Admitting: Internal Medicine

## 2022-09-11 VITALS — BP 126/74 | HR 72 | Resp 14 | Wt 286.5 lb

## 2022-09-11 DIAGNOSIS — I493 Ventricular premature depolarization: Secondary | ICD-10-CM | POA: Diagnosis not present

## 2022-09-11 DIAGNOSIS — I428 Other cardiomyopathies: Secondary | ICD-10-CM | POA: Diagnosis not present

## 2022-09-11 DIAGNOSIS — Z87891 Personal history of nicotine dependence: Secondary | ICD-10-CM | POA: Diagnosis not present

## 2022-09-11 DIAGNOSIS — I2510 Atherosclerotic heart disease of native coronary artery without angina pectoris: Secondary | ICD-10-CM | POA: Insufficient documentation

## 2022-09-11 DIAGNOSIS — G4733 Obstructive sleep apnea (adult) (pediatric): Secondary | ICD-10-CM

## 2022-09-11 DIAGNOSIS — I5022 Chronic systolic (congestive) heart failure: Secondary | ICD-10-CM | POA: Diagnosis not present

## 2022-09-11 DIAGNOSIS — I10 Essential (primary) hypertension: Secondary | ICD-10-CM | POA: Diagnosis not present

## 2022-09-11 DIAGNOSIS — I5020 Unspecified systolic (congestive) heart failure: Secondary | ICD-10-CM | POA: Diagnosis not present

## 2022-09-11 DIAGNOSIS — I472 Ventricular tachycardia, unspecified: Secondary | ICD-10-CM | POA: Insufficient documentation

## 2022-09-11 DIAGNOSIS — I110 Hypertensive heart disease with heart failure: Secondary | ICD-10-CM | POA: Insufficient documentation

## 2022-09-11 DIAGNOSIS — E119 Type 2 diabetes mellitus without complications: Secondary | ICD-10-CM | POA: Insufficient documentation

## 2022-09-11 DIAGNOSIS — Z79899 Other long term (current) drug therapy: Secondary | ICD-10-CM | POA: Insufficient documentation

## 2022-09-11 LAB — COMPREHENSIVE METABOLIC PANEL
ALT: 21 U/L (ref 0–44)
AST: 20 U/L (ref 15–41)
Albumin: 3.8 g/dL (ref 3.5–5.0)
Alkaline Phosphatase: 107 U/L (ref 38–126)
Anion gap: 11 (ref 5–15)
BUN: 21 mg/dL (ref 8–23)
CO2: 26 mmol/L (ref 22–32)
Calcium: 9.3 mg/dL (ref 8.9–10.3)
Chloride: 100 mmol/L (ref 98–111)
Creatinine, Ser: 1.53 mg/dL — ABNORMAL HIGH (ref 0.61–1.24)
GFR, Estimated: 49 mL/min — ABNORMAL LOW (ref >60)
Glucose, Bld: 191 mg/dL — ABNORMAL HIGH (ref 70–99)
Potassium: 3.7 mmol/L (ref 3.5–5.1)
Sodium: 137 mmol/L (ref 135–145)
Total Bilirubin: 1 mg/dL (ref 0.3–1.2)
Total Protein: 7 g/dL (ref 6.5–8.1)

## 2022-09-11 LAB — ECHOCARDIOGRAM COMPLETE
S' Lateral: 5 cm
Weight: 4584 oz

## 2022-09-11 LAB — BRAIN NATRIURETIC PEPTIDE: B Natriuretic Peptide: 201.4 pg/mL — ABNORMAL HIGH (ref 0.0–100.0)

## 2022-09-11 NOTE — Progress Notes (Signed)
*  PRELIMINARY RESULTS* Echocardiogram 2D Echocardiogram has been performed.  Sherrie Sport 09/11/2022, 10:49 AM

## 2022-09-11 NOTE — Patient Instructions (Addendum)
  Lab Work:  Labs done today, your results will be available in MyChart, we will contact you for abnormal readings.    Special Instructions // Education:  Do the following things EVERYDAY: Weigh yourself in the morning before breakfast. Write it down and keep it in a log. Take your medicines as prescribed Eat low salt foods--Limit salt (sodium) to 2000 mg per day.  Stay as active as you can everyday Limit all fluids for the day to less than 2 liters   Follow-Up in: 3 months with Dr. Haroldine Laws    If you have any questions or concerns before your next appointment please send [place] a message through mychart or call our office at [phone] Monday-Friday 8 am-5 pm.   If you have an urgent need after hours on the weekend please call your Primary Cardiologist or the [facility] in [place] at [phone].

## 2022-09-11 NOTE — Progress Notes (Signed)
ADVANCED HF CLINIC NOTE  Referring Physician: Kate Sable, MD  Primary Care: [facility] Primary Cardiologist: Kate Sable, MD  HPI:  Erik Black is a 72 y.o. male with morbid obesity, HTN, OSA on CPAP, DM2 HFrEF EF 30 to 35%, diabetes, former smoker  Diagnosed with HF in 9/23. Echo 9/23 EF 30-35%. Started lasix. Referred to [facility]   Admitted 11/23 with ADHF cath as below. Treated with IV lasix and GDMT titrated. Diuresed 22 pounds.   Cath: 05/03/22  LAD 60%d LCx 55% prox RCA 30%m  RA 18 RV 68/15 PA 69/39 (41) PCWP 40  Fick 5.6/2.3   cMRI: 11/23 EF 25% mid wall LGE strip c/w NICM. RV moderately down.   Zio 11/23: Occasional NSVT 13.7% PVCs (one predominant morphology 13.6%)  Here with his wife. Has f/u echo pending today. Says he is doing better than he was but hasn't gotten up to "full speed" yet. Able to do ADLs and go to mailbox without problem. No edema, orthopnea or PND. Compliant with meds   FHx - Mother with HF - 36 Brothers with HF - 1 with ICD - Father with MI     Past Medical History:  Diagnosis Date   Anxiety    Arthritis    CHF (congestive heart failure) ([place])    Depression    Diabetes mellitus without complication (HCC)    GERD (gastroesophageal reflux disease)    Glaucoma    both eyes   Hypertension    Sleep apnea    uses Cpap nightly    Current Outpatient Medications  Medication Sig Dispense Refill   AMLODIPINE BESYLATE PO Take 10 mg by mouth daily.     aspirin EC 81 MG tablet Take 1 tablet (81 mg total) by mouth 2 (two) times daily. 84 tablet 0   atorvastatin (LIPITOR) 40 MG tablet Take 1 tablet (40 mg total) by mouth at bedtime. 90 tablet 3   BREZTRI AEROSPHERE 160-9-4.8 MCG/ACT AERO Inhale into the lungs.     brimonidine (ALPHAGAN) 0.2 % ophthalmic solution Place 1 drop into both eyes 3 (three) times daily.     carvedilol (COREG) 25 MG tablet TAKE 1 TABLET BY MOUTH TWICE A DAY 180 tablet 1   Cholecalciferol  (VITAMIN D) 50 MCG (2000 UT) tablet Take 4,000 Units by mouth daily.      docusate sodium (COLACE) 100 MG capsule Take 100 mg by mouth daily as needed.     DULoxetine (CYMBALTA) 60 MG capsule Take 60 mg by mouth daily.     latanoprost (XALATAN) 0.005 % ophthalmic solution Place 1 drop into both eyes at bedtime.     metoprolol tartrate (LOPRESSOR) 50 MG tablet Take 1 tablet by mouth 2 (two) times daily.     mexiletine (MEXITIL) 200 MG capsule TAKE 1 CAPSULE BY MOUTH TWICE A DAY 60 capsule 3   Multiple Vitamins-Minerals (MULTIVITAMIN WITH MINERALS) tablet Take 1 tablet by mouth daily.     Omega-3 1000 MG CAPS Take 2,000 mg by mouth 3 (three) times daily after meals.     omeprazole (PRILOSEC) 20 MG capsule Take 20 mg by mouth 2 (two) times daily.     OXcarbazepine (TRILEPTAL) 150 MG tablet Take 150 mg by mouth 2 (two) times daily.     potassium chloride SA (KLOR-CON M) 20 MEQ tablet Take 1-2 tablets (20-40 mEq total) by mouth as directed. Take two tablets (40 mEq) daily in the morning, and take one tablet (20 mEq) daily in the evening.  90 tablet 3   sacubitril-valsartan (ENTRESTO) 97-103 MG Take 1 tablet by mouth 2 (two) times daily. 60 tablet 6   Semaglutide,0.25 or 0.'5MG'$ /DOS, 2 MG/3ML SOPN Inject 0.25 mg into the skin once a week. 3 mL 5   spironolactone (ALDACTONE) 25 MG tablet Take 1 tablet (25 mg total) by mouth daily. 30 tablet 6   timolol (TIMOPTIC-XR) 0.5 % ophthalmic gel-forming Place 1 drop into both eyes daily.     torsemide (DEMADEX) 20 MG tablet TAKE 2 TABLETS (40 MG TOTAL) BY MOUTH DAILY. (Patient taking differently: Take 40 mg by mouth daily. Takes '60mg'$  when weight goes up at home.) 180 tablet 1   No current facility-administered medications for this visit.    Allergies  Allergen Reactions   Empagliflozin Other (See Comments)    Yeast infection  Other Reaction(s): Bleeding skin   Methadone    Oxycodone Itching    Hands started peeling and "could not swallow"   Lisinopril  Cough      Social History   Socioeconomic History   Marital status: Married    Spouse name: Marcie Bal   Number of children: 0   Years of education: Not on file   Highest education level: Not on file  Occupational History   Not on file  Tobacco Use   Smoking status: Former    Packs/day: 0.10    Types: Cigarettes    Quit date: 05/26/2019    Years since quitting: 3.2   Smokeless tobacco: Never   Tobacco comments:    smokes a pack a month  Vaping Use   Vaping Use: Never used  Substance and Sexual Activity   Alcohol use: Not Currently   Drug use: Not Currently   Sexual activity: Not on file  Other Topics Concern   Not on file  Social History Narrative   Lives at home with wife Marcie Bal and a dog.   Social Determinants of Health   Financial Resource Strain: Not on file  Food Insecurity: No Food Insecurity (06/01/2022)   Hunger Vital Sign    Worried About Running Out of Food in the Last Year: Never true    Ran Out of Food in the Last Year: Never true  Transportation Needs: No Transportation Needs (06/01/2022)   PRAPARE - Hydrologist (Medical): No    Lack of Transportation (Non-Medical): No  Physical Activity: Not on file  Stress: Not on file  Social Connections: Not on file  Intimate Partner Violence: Not At Risk (06/01/2022)   Humiliation, Afraid, Rape, and Kick questionnaire    Fear of Current or Ex-Partner: No    Emotionally Abused: No    Physically Abused: No    Sexually Abused: No      Family History  Problem Relation Age of Onset   Hypertension Mother    Diabetes Mother    Heart disease Mother    Heart disease Father    Heart disease Brother     Vitals:   09/11/22 0947  BP: 126/74  Pulse: 72  Resp: 14  SpO2: 100%  Weight: 286 lb 8 oz (130 kg)     PHYSICAL EXAM: General:  Well appearing. No resp difficulty HEENT: normal Neck: supple. no JVD. Carotids 2+ bilat; no bruits. No lymphadenopathy or thryomegaly  appreciated. Cor: PMI nondisplaced. Regular rate & rhythm. No rubs, gallops or murmurs. Lungs: clear Abdomen: soft, nontender, nondistended. No hepatosplenomegaly. No bruits or masses. Good bowel sounds. Extremities: no cyanosis, clubbing, rash, edema Neuro:  alert & orientedx3, cranial nerves grossly intact. moves all 4 extremities w/o difficulty. Affect pleasant   ASSESSMENT & PLAN:   1. Chronic systolic HF - Echocardiogram 03/2022 EF 30 to 35% - Cath 11/23 non-obstructive CAD - Admit 11/23 with marked volume overload.  - cMRI: 11/23 EF 25% mid wall LGE strip c/w NICM. RV moderately down.  - Zio 11/23: Occasional NSVT 13.7% PVCs (one predominant morphology 13.6%) - Suspect PVC CM  - Stable NYHA II Volume status looks good on torsemide 40 daily - Continue Entresto to 97/103 bid - Continue carvedilol 25 bid - Continue spiro 25 daily - Patient did not tolerate Jardiance in the past, developed yeast infection - Suspect PVC CM vs familial (LMNA)  No evidence of infiltrative process on MRI  - Has seen Dr. Broadus John and decided not to test - Plan repeat echo later today to see if EF improving with PVC suppression If no improvement will repeat Zio to ensure PVC adequately addressed  2. Frequent PVCs - Zio 11/23: Occasional NSVT 13.7% PVCs (one predominant morphology 13.6%) - Continue mexilitene 200 bid for suppression  - PVCs suppressed on exam today  3. HTN - Blood pressure well controlled. Continue current regimen.  4. OSA - compliant with CPAP  5. Morbid obesity  - continue GLP-1 RA  Glori Bickers, MD  9:53 AM

## 2022-10-10 ENCOUNTER — Other Ambulatory Visit: Payer: Self-pay | Admitting: Internal Medicine

## 2022-11-16 ENCOUNTER — Other Ambulatory Visit: Payer: Self-pay | Admitting: Internal Medicine

## 2022-11-16 DIAGNOSIS — I5022 Chronic systolic (congestive) heart failure: Secondary | ICD-10-CM

## 2022-12-05 ENCOUNTER — Ambulatory Visit (HOSPITAL_BASED_OUTPATIENT_CLINIC_OR_DEPARTMENT_OTHER): Payer: Medicare HMO | Admitting: Internal Medicine

## 2022-12-05 ENCOUNTER — Other Ambulatory Visit
Admission: RE | Admit: 2022-12-05 | Discharge: 2022-12-05 | Disposition: A | Discharge status: Home / Self Care | Payer: Medicare HMO | Source: Ambulatory Visit | Attending: Internal Medicine | Admitting: Internal Medicine

## 2022-12-05 ENCOUNTER — Inpatient Hospital Stay (HOSPITAL_COMMUNITY)
Admission: RE | Admit: 2022-12-05 | Discharge: 2022-12-05 | Disposition: A | Discharge status: Home / Self Care | Payer: Medicare HMO | Source: Ambulatory Visit | Attending: Internal Medicine | Admitting: Internal Medicine

## 2022-12-05 ENCOUNTER — Other Ambulatory Visit: Payer: Self-pay | Admitting: Internal Medicine

## 2022-12-05 VITALS — BP 131/76 | HR 83 | Ht 72.0 in | Wt 277.0 lb

## 2022-12-05 DIAGNOSIS — I493 Ventricular premature depolarization: Secondary | ICD-10-CM

## 2022-12-05 DIAGNOSIS — I5022 Chronic systolic (congestive) heart failure: Secondary | ICD-10-CM | POA: Diagnosis present

## 2022-12-05 DIAGNOSIS — G4733 Obstructive sleep apnea (adult) (pediatric): Secondary | ICD-10-CM | POA: Diagnosis not present

## 2022-12-05 DIAGNOSIS — I10 Essential (primary) hypertension: Secondary | ICD-10-CM

## 2022-12-05 LAB — BASIC METABOLIC PANEL
Anion gap: 8 (ref 5–15)
BUN: 19 mg/dL (ref 8–23)
CO2: 26 mmol/L (ref 22–32)
Calcium: 9.5 mg/dL (ref 8.9–10.3)
Chloride: 102 mmol/L (ref 98–111)
Creatinine, Ser: 1.48 mg/dL — ABNORMAL HIGH (ref 0.61–1.24)
GFR, Estimated: 51 mL/min — ABNORMAL LOW (ref >60)
Glucose, Bld: 319 mg/dL — ABNORMAL HIGH (ref 70–99)
Potassium: 4.1 mmol/L (ref 3.5–5.1)
Sodium: 136 mmol/L (ref 135–145)

## 2022-12-05 LAB — BRAIN NATRIURETIC PEPTIDE: B Natriuretic Peptide: 82.4 pg/mL (ref 0.0–100.0)

## 2022-12-05 LAB — TSH: TSH: 0.774 u[IU]/mL (ref 0.350–4.500)

## 2022-12-05 NOTE — Progress Notes (Addendum)
ADVANCED HF CLINIC NOTE  Referring Physician: Debbe Odea, MD  Primary Care: [facility] Primary Cardiologist: Debbe Odea, MD  HPI:  Erik Black is a 72 y.o. male with morbid obesity, HTN, OSA on CPAP, DM2 HFrEF EF 30 to 35%, diabetes, former smoker  Diagnosed with HF in 9/23. Echo 9/23 EF 30-35%. Started lasix. Referred to [facility]   Admitted 11/23 with ADHF cath as below. Treated with IV lasix and GDMT titrated. Diuresed 22 pounds.   Cath: 05/03/22  LAD 60%d LCx 55% prox RCA 30%m  RA 18 RV 68/15 PA 69/39 (41) PCWP 40  Fick 5.6/2.3   cMRI: 11/23 EF 25% mid wall LGE strip c/w NICM. RV moderately down.   Zio 11/23: Occasional NSVT 13.7% PVCs (one predominant morphology 13.6%)  Echo 3/24: EF 20-25% RV ok   Here for f/u. Remains very fatigued. Can do ADLs at a slow pace without SOB but gets winded if he does more. No edema, orthopnea or PND. No palpitations. Wears CPAP regularly with good download data.    FHx - Mother with HF - 28 Brothers with HF - 1 with ICD - Father with MI     Past Medical History:  Diagnosis Date   Anxiety    Arthritis    CHF (congestive heart failure) (HCC)    Depression    Diabetes mellitus without complication (HCC)    GERD (gastroesophageal reflux disease)    Glaucoma    both eyes   Hypertension    Sleep apnea    uses Cpap nightly    Current Outpatient Medications  Medication Sig Dispense Refill   AMLODIPINE BESYLATE PO Take 10 mg by mouth daily.     aspirin EC 81 MG tablet Take 1 tablet (81 mg total) by mouth 2 (two) times daily. 84 tablet 0   atorvastatin (LIPITOR) 40 MG tablet Take 1 tablet (40 mg total) by mouth at bedtime. 90 tablet 3   BREZTRI AEROSPHERE 160-9-4.8 MCG/ACT AERO Inhale into the lungs.     brimonidine (ALPHAGAN) 0.2 % ophthalmic solution Place 1 drop into both eyes 3 (three) times daily.     carvedilol (COREG) 25 MG tablet TAKE 1 TABLET BY MOUTH TWICE A DAY 180 tablet 1    Cholecalciferol (VITAMIN D) 50 MCG (2000 UT) tablet Take 4,000 Units by mouth daily.      docusate sodium (COLACE) 100 MG capsule Take 100 mg by mouth daily as needed.     DULoxetine (CYMBALTA) 60 MG capsule Take 60 mg by mouth daily.     KLOR-CON M20 20 MEQ tablet TAKE 2 TABLETS BY MOUTH EVERY MORNING AND 1 TABLET IN THE EVENING 270 tablet 1   latanoprost (XALATAN) 0.005 % ophthalmic solution Place 1 drop into both eyes at bedtime.     mexiletine (MEXITIL) 200 MG capsule TAKE 1 CAPSULE BY MOUTH TWICE A DAY 90 capsule 3   Multiple Vitamins-Minerals (MULTIVITAMIN WITH MINERALS) tablet Take 1 tablet by mouth daily.     Omega-3 1000 MG CAPS Take 2,000 mg by mouth 3 (three) times daily after meals.     omeprazole (PRILOSEC) 20 MG capsule Take 20 mg by mouth 2 (two) times daily.     OXcarbazepine (TRILEPTAL) 150 MG tablet Take 150 mg by mouth 2 (two) times daily.     sacubitril-valsartan (ENTRESTO) 97-103 MG Take 1 tablet by mouth 2 (two) times daily. 60 tablet 6   Semaglutide,0.25 or 0.5MG /DOS, 2 MG/3ML SOPN Inject 0.25 mg into the skin once  a week. 3 mL 5   spironolactone (ALDACTONE) 25 MG tablet TAKE 1 TABLET (25 MG TOTAL) BY MOUTH DAILY. 90 tablet 2   timolol (TIMOPTIC-XR) 0.5 % ophthalmic gel-forming Place 1 drop into both eyes daily.     torsemide (DEMADEX) 20 MG tablet TAKE 2 TABLETS (40 MG TOTAL) BY MOUTH DAILY. (Patient taking differently: Take 40 mg by mouth daily. Takes 60mg  when weight goes up at home.) 180 tablet 1   No current facility-administered medications for this visit.    Allergies  Allergen Reactions   Empagliflozin Other (See Comments)    Yeast infection  Other Reaction(s): Bleeding skin   Methadone    Oxycodone Itching    Hands started peeling and "could not swallow"   Lisinopril Cough      Social History   Socioeconomic History   Marital status: Married    Spouse name: [name]   Number of children: 0   Years of education: Not on file   Highest education level:  Not on file  Occupational History   Not on file  Tobacco Use   Smoking status: Former    Packs/day: .1    Types: Cigarettes    Quit date: 05/26/2019    Years since quitting: 3.5   Smokeless tobacco: Never   Tobacco comments:    smokes a pack a month  Vaping Use   Vaping Use: Never used  Substance and Sexual Activity   Alcohol use: Not Currently   Drug use: Not Currently   Sexual activity: Not on file  Other Topics Concern   Not on file  Social History Narrative   Lives at home with wife [name] and a dog.   Social Determinants of Health   Financial Resource Strain: Not on file  Food Insecurity: No Food Insecurity (06/01/2022)   Hunger Vital Sign    Worried About Running Out of Food in the Last Year: Never true    Ran Out of Food in the Last Year: Never true  Transportation Needs: No Transportation Needs (06/01/2022)   PRAPARE - Administrator, Civil Service (Medical): No    Lack of Transportation (Non-Medical): No  Physical Activity: Not on file  Stress: Not on file  Social Connections: Not on file  Intimate Partner Violence: Not At Risk (06/01/2022)   Humiliation, Afraid, Rape, and Kick questionnaire    Fear of Current or Ex-Partner: No    Emotionally Abused: No    Physically Abused: No    Sexually Abused: No      Family History  Problem Relation Age of Onset   Hypertension Mother    Diabetes Mother    Heart disease Mother    Heart disease Father    Heart disease Brother     Vitals:   12/05/22 0956  BP: 131/76  Pulse: 83  SpO2: 100%  Weight: 277 lb (125.6 kg)  Height: 6' (1.829 m)     PHYSICAL EXAM: General:  Well appearing. No resp difficulty HEENT: normal Neck: supple. no JVD. Carotids 2+ bilat; no bruits. No lymphadenopathy or thryomegaly appreciated. Cor: PMI nondisplaced. Regular rate & rhythm. No rubs, gallops or murmurs. Lungs: clear Abdomen: obese soft, nontender, nondistended. No hepatosplenomegaly. No bruits or masses. Good bowel  sounds. Extremities: no cyanosis, clubbing, rash, edema Neuro: alert & orientedx3, cranial nerves grossly intact. moves all 4 extremities w/o difficulty. Affect pleasant    ECG: NSR 88. LVH Qrs No PVCs Personally reviewed  ASSESSMENT & PLAN:   1.  Chronic systolic HF - Echocardiogram 03/2022 EF 30 to 35% - Cath 11/23 non-obstructive CAD - Admit 11/23 with marked volume overload.  - cMRI: 11/23 EF 25% mid wall LGE strip c/w NICM. RV moderately down.  - Zio 11/23: Occasional NSVT 13.7% PVCs (one predominant morphology 13.6%) - Echo 3/24: EF 20-25% RV ok  - Suspect PVC CM vs familial (LMNA)  No evidence of infiltrative process on MRI  - EF has not improved with mexilitene. PVCs appear suppressed on ECG today. Will repeat Zio to confirm. He has met with Dr. Jomarie Longs in [place] who agrees that Fhx is concerning for potential genetic CM however he has no first-degree relatives that would benefit from screening so has decided not to pursue - Stable NYHA III - Volume status looks good on torsemide 40 daily - Continue Entresto to 97/103 bid - Continue carvedilol 25 bid - Continue spiro 25 daily - Patient did not tolerate Jardiance in the past, developed yeast infection - Labs today - Refer to EP for ICD - Can consider CCM or Barostim down the road - I d/w his wife by phone  2. Frequent PVCs - Zio 11/23: Occasional NSVT 13.7% PVCs (one predominant morphology 13.6%) - Continue mexilitene 200 bid for suppression  - PVCs suppressed on exam and ECG today - Plan as above  3. HTN - Blood pressure well controlled. Continue current regimen.  4. OSA - compliant with CPAP  5. Morbid obesity  - continue GLP-1 RA - Body mass index is 37.57 kg/m.  Arvilla Meres, MD  10:25 AM

## 2022-12-05 NOTE — Patient Instructions (Signed)
Medication Changes:  No medication changes.  Lab Work:  Labs done today, your results will be available in MyChart, we will contact you for abnormal readings.   Testing/Procedures:  Zio patch placed onto patient.  All instructions and information reviewed with patient, they verbalize understanding with no questions.   Referrals:  You have been referred to Electrophysiology. Their office will call you to schedule an appointment.   Special Instructions // Education:  Do the following things EVERYDAY: Weigh yourself in the morning before breakfast. Write it down and keep it in a log. Take your medicines as prescribed Eat low salt foods--Limit salt (sodium) to 2000 mg per day.  Stay as active as you can everyday Limit all fluids for the day to less than 2 liters   Follow-Up in: follow up in 2 months with Dr. Gala Romney.    If you have any questions or concerns before your next appointment please send [place] a message through [place] or call our office at [phone] Monday-Friday 8 am-5 pm.   If you have an urgent need after hours on the weekend please call your Primary Cardiologist or the [facility] in [place] at [phone].

## 2022-12-05 NOTE — Addendum Note (Signed)
Encounter addended by: Chrystine Oiler, CMA on: 12/05/2022 12:59 PM  Actions taken: Imaging Exam begun

## 2022-12-25 ENCOUNTER — Telehealth: Payer: Self-pay

## 2022-12-25 NOTE — Addendum Note (Signed)
Encounter addended by: Crissie Figures, RN on: 12/25/2022 2:51 PM  Actions taken: Imaging Exam ended

## 2022-12-28 NOTE — Telephone Encounter (Signed)
Pt called requesting to speak to Dr. Gala Romney. Pt states he has had rt sided arm pain that radiates down through his hip and feels like someone is electrocuting him. Pt states he doesn't have clearance to see his ortho dr. Please advise.   Per dr. Hassell Done to refer   Called and spoke with pt. Notified pt that it is ok to go see ortho per Dr. Gala Romney. Pt had good understanding and no further questions.

## 2023-01-05 ENCOUNTER — Other Ambulatory Visit: Payer: Self-pay | Admitting: Family

## 2023-01-05 ENCOUNTER — Other Ambulatory Visit (HOSPITAL_COMMUNITY): Payer: Self-pay | Admitting: Internal Medicine

## 2023-01-07 NOTE — Telephone Encounter (Signed)
Erik Black previously started the pt on ozempic, but he since switched to Dr. Gala Black, and has never been dose adjusted. Can you schedule this pt to be seen in [facility] for medication management? I spoke with patient and he is aware and agreeable. Thank you

## 2023-01-08 ENCOUNTER — Telehealth: Payer: Self-pay | Admitting: Pharmacist

## 2023-01-08 NOTE — Telephone Encounter (Signed)
Called pt and left message. If tolerating well, will increase Ozempic to 0.5mg  weekly and continue with monthly dose titrations.  Most recent A1c 7.7% in December 2023, not on any other meds for his DM.

## 2023-01-08 NOTE — Telephone Encounter (Signed)
  Erik Maffucci, RN  Registered Nurse   Telephone Encounter Signed   Creation Time: 01/07/2023  1:18 PM   Signed     Erik Black previously started the pt on ozempic, but he since switched to Dr. Gala Romney, and has never been dose adjusted. Can you schedule this pt to be seen in [facility] for medication management? I spoke with patient and he is aware and agreeable. Thank you

## 2023-01-08 NOTE — Telephone Encounter (Signed)
Called pt to discuss and left message. 

## 2023-01-09 NOTE — Telephone Encounter (Signed)
2nd message left for pt.

## 2023-01-09 NOTE — Telephone Encounter (Signed)
Left second message for pt

## 2023-01-10 ENCOUNTER — Encounter: Payer: Self-pay | Admitting: Cardiology

## 2023-01-10 ENCOUNTER — Ambulatory Visit: Payer: Medicare HMO | Attending: Cardiology | Admitting: Cardiology

## 2023-01-10 ENCOUNTER — Other Ambulatory Visit
Admission: RE | Admit: 2023-01-10 | Discharge: 2023-01-10 | Disposition: A | Discharge status: Home / Self Care | Payer: Medicare HMO | Source: Ambulatory Visit | Attending: Cardiology | Admitting: Cardiology

## 2023-01-10 VITALS — BP 126/84 | HR 77 | Ht 72.0 in | Wt 274.4 lb

## 2023-01-10 DIAGNOSIS — I428 Other cardiomyopathies: Secondary | ICD-10-CM | POA: Insufficient documentation

## 2023-01-10 DIAGNOSIS — I5022 Chronic systolic (congestive) heart failure: Secondary | ICD-10-CM | POA: Insufficient documentation

## 2023-01-10 DIAGNOSIS — I493 Ventricular premature depolarization: Secondary | ICD-10-CM | POA: Insufficient documentation

## 2023-01-10 DIAGNOSIS — I10 Essential (primary) hypertension: Secondary | ICD-10-CM | POA: Diagnosis not present

## 2023-01-10 LAB — CBC WITH DIFFERENTIAL/PLATELET
Abs Immature Granulocytes: 0.01 10*3/uL (ref 0.00–0.07)
Basophils Absolute: 0 10*3/uL (ref 0.0–0.1)
Basophils Relative: 1 %
Eosinophils Absolute: 0.1 10*3/uL (ref 0.0–0.5)
Eosinophils Relative: 1 %
HCT: 43.8 % (ref 39.0–52.0)
Hemoglobin: 14.8 g/dL (ref 13.0–17.0)
Immature Granulocytes: 0 %
Lymphocytes Relative: 35 %
Lymphs Abs: 2 10*3/uL (ref 0.7–4.0)
MCH: 29.5 pg (ref 26.0–34.0)
MCHC: 33.8 g/dL (ref 30.0–36.0)
MCV: 87.3 fL (ref 80.0–100.0)
Monocytes Absolute: 0.5 10*3/uL (ref 0.1–1.0)
Monocytes Relative: 8 %
Neutro Abs: 3.3 10*3/uL (ref 1.7–7.7)
Neutrophils Relative %: 55 %
Platelets: 148 10*3/uL — ABNORMAL LOW (ref 150–400)
RBC: 5.02 MIL/uL (ref 4.22–5.81)
RDW: 12.4 % (ref 11.5–15.5)
WBC: 5.8 10*3/uL (ref 4.0–10.5)
nRBC: 0 % (ref 0.0–0.2)

## 2023-01-10 LAB — BASIC METABOLIC PANEL
Anion gap: 9 (ref 5–15)
BUN: 18 mg/dL (ref 8–23)
CO2: 24 mmol/L (ref 22–32)
Calcium: 9.7 mg/dL (ref 8.9–10.3)
Chloride: 103 mmol/L (ref 98–111)
Creatinine, Ser: 1.47 mg/dL — ABNORMAL HIGH (ref 0.61–1.24)
GFR, Estimated: 51 mL/min — ABNORMAL LOW (ref >60)
Glucose, Bld: 260 mg/dL — ABNORMAL HIGH (ref 70–99)
Potassium: 4.1 mmol/L (ref 3.5–5.1)
Sodium: 136 mmol/L (ref 135–145)

## 2023-01-10 NOTE — H&P (View-Only) (Signed)
Electrophysiology Office Note:    Date:  01/10/2023   ID:  Erik Black, DOB 1951/02/15, MRN [id number]  [facility] Cardiologist:  Debbe Odea, MD  [facility] Electrophysiologist:  Lanier Prude, MD   Referring MD: Dolores Patty, MD   Chief Complaint: Chronic systolic heart failure  History of Present Illness:    Erik Black is a 72 y.o. malewho I am seeing today for an evaluation of chronic systolic heart failure at the request of Dr. Gala Romney.  The patient was last seen by Dr. Gala Romney on December 05, 2022.  The patient has a medical history that includes obesity, hyper tension, sleep apnea, diabetes, chronic systolic heart failure, tobacco abuse.  He was diagnosed with heart failure in September 2023 with an ejection fraction of 30 to 35%.  Cardiac MRI performed in November 2023 was consistent with a diagnosis of nonischemic cardiomyopathy.  Ejection fraction was 25%.  He is fatigued and has shortness of breath with minimal exertion.  There is a strong family history of heart disease including heart failure and his mother and 4 brothers.  One of his brothers has a defibrillator.  He has been started on guideline directed medical therapy but continues to have a depressed ejection fraction.  He is referred to discuss defibrillator implant.  Patient is with his wife today in clinic.  He is active.  He is awaiting rotator cuff repair on bilateral shoulders but understands this will need to happen after his defibrillator has been implanted and healed.  He is working with Dr. Gala Romney in clinic.        Their past medical, social and family history was reveiwed.   ROS:   Please see the history of present illness.    All other systems reviewed and are negative.  EKGs/Labs/Other Studies Reviewed:    The following studies were reviewed today:  December 05, 2022 EKG shows sinus rhythm with a narrow QRS complex  May 23, 2022 cardiac MRI EF 25 Global  hypokinesis LGE in the mid wall at the RV insertion sites Moderately reduced RV function No significant valvular abnormalities       Physical Exam:    VS:  BP 126/84   Pulse 77   Ht 6' (1.829 m)   Wt 274 lb 6.4 oz (124.5 kg)   SpO2 96%   BMI 37.22 kg/m     Wt Readings from Last 3 Encounters:  01/10/23 274 lb 6.4 oz (124.5 kg)  12/05/22 277 lb (125.6 kg)  09/11/22 286 lb 8 oz (130 kg)     GEN:  Well nourished, well developed in no acute distress CARDIAC: RRR, no murmurs, rubs, gallops RESPIRATORY:  Clear to auscultation without rales, wheezing or rhonchi       ASSESSMENT AND PLAN:    1. Chronic systolic heart failure (HCC)   2. NICM (nonischemic cardiomyopathy) (HCC)   3. PVC's (premature ventricular contractions)   4. Primary hypertension     #Chronic systolic heart failure #Nonischemic cardiomyopathy NYHA class III.  Warm and dry on exam.  Follows with Dr. Gala Romney.  EF by cardiac MRI 25%.  On guideline directed Medical therapy.  We discussed defibrillator therapy during today's visit and he wishes to proceed with implant. Continue Coreg, Entresto, spironolactone, torsemide  The patient has a nonischemic CM (EF 20%), NYHA Class III CHF, and CAD.  He is referred by Dr. Gala Romney for risk stratification of sudden death and consideration of ICD implantation.  At this time, he meets criteria  for ICD implantation for primary prevention of sudden death.  I have had a thorough discussion with the patient reviewing options.  The patient and their family (if available) have had opportunities to ask questions and have them answered. The patient and I have decided together through a shared decision making process to proceed with ICD implant at this time.    Risks, benefits, alternatives to ICD implantation were discussed in detail with the patient today. The patient understands that the risks include but are not limited to bleeding, infection, pneumothorax, perforation,  tamponade, vascular damage, renal failure, MI, stroke, death, inappropriate shocks, and lead dislodgement and wishes to proceed.  We will therefore schedule device implantation at the next available time.  Plan for Medtronic VVI ICD.  Will need to hold his aspirin for 5 days prior to the procedure.  #PVCs Appear to be successfully suppressed using mexiletine.  Continue mexiletine 200 mg by mouth twice daily.  #Hypertension At  goal today.  Recommend checking blood pressures 1-2 times per week at home and recording the values.  Recommend bringing these recordings to the primary care physician.      Signed, Rossie Muskrat. Lalla Brothers, MD, [facility], [facility] 01/10/2023 3:43 PM    Electrophysiology  [facility]

## 2023-01-10 NOTE — Progress Notes (Signed)
Electrophysiology Office Note:    Date:  01/10/2023   ID:  Erik Black, DOB 1951/02/15, MRN [id number]  [facility] Cardiologist:  Debbe Odea, MD  [facility] Electrophysiologist:  Lanier Prude, MD   Referring MD: Dolores Patty, MD   Chief Complaint: Chronic systolic heart failure  History of Present Illness:    Erik Black is a 72 y.o. malewho I am seeing today for an evaluation of chronic systolic heart failure at the request of Dr. Gala Romney.  The patient was last seen by Dr. Gala Romney on December 05, 2022.  The patient has a medical history that includes obesity, hyper tension, sleep apnea, diabetes, chronic systolic heart failure, tobacco abuse.  He was diagnosed with heart failure in September 2023 with an ejection fraction of 30 to 35%.  Cardiac MRI performed in November 2023 was consistent with a diagnosis of nonischemic cardiomyopathy.  Ejection fraction was 25%.  He is fatigued and has shortness of breath with minimal exertion.  There is a strong family history of heart disease including heart failure and his mother and 4 brothers.  One of his brothers has a defibrillator.  He has been started on guideline directed medical therapy but continues to have a depressed ejection fraction.  He is referred to discuss defibrillator implant.  Patient is with his wife today in clinic.  He is active.  He is awaiting rotator cuff repair on bilateral shoulders but understands this will need to happen after his defibrillator has been implanted and healed.  He is working with Dr. Gala Romney in clinic.        Their past medical, social and family history was reveiwed.   ROS:   Please see the history of present illness.    All other systems reviewed and are negative.  EKGs/Labs/Other Studies Reviewed:    The following studies were reviewed today:  December 05, 2022 EKG shows sinus rhythm with a narrow QRS complex  May 23, 2022 cardiac MRI EF 25 Global  hypokinesis LGE in the mid wall at the RV insertion sites Moderately reduced RV function No significant valvular abnormalities       Physical Exam:    VS:  BP 126/84   Pulse 77   Ht 6' (1.829 m)   Wt 274 lb 6.4 oz (124.5 kg)   SpO2 96%   BMI 37.22 kg/m     Wt Readings from Last 3 Encounters:  01/10/23 274 lb 6.4 oz (124.5 kg)  12/05/22 277 lb (125.6 kg)  09/11/22 286 lb 8 oz (130 kg)     GEN:  Well nourished, well developed in no acute distress CARDIAC: RRR, no murmurs, rubs, gallops RESPIRATORY:  Clear to auscultation without rales, wheezing or rhonchi       ASSESSMENT AND PLAN:    1. Chronic systolic heart failure (HCC)   2. NICM (nonischemic cardiomyopathy) (HCC)   3. PVC's (premature ventricular contractions)   4. Primary hypertension     #Chronic systolic heart failure #Nonischemic cardiomyopathy NYHA class III.  Warm and dry on exam.  Follows with Dr. Gala Romney.  EF by cardiac MRI 25%.  On guideline directed Medical therapy.  We discussed defibrillator therapy during today's visit and he wishes to proceed with implant. Continue Coreg, Entresto, spironolactone, torsemide  The patient has a nonischemic CM (EF 20%), NYHA Class III CHF, and CAD.  He is referred by Dr. Gala Romney for risk stratification of sudden death and consideration of ICD implantation.  At this time, he meets criteria  for ICD implantation for primary prevention of sudden death.  I have had a thorough discussion with the patient reviewing options.  The patient and their family (if available) have had opportunities to ask questions and have them answered. The patient and I have decided together through a shared decision making process to proceed with ICD implant at this time.    Risks, benefits, alternatives to ICD implantation were discussed in detail with the patient today. The patient understands that the risks include but are not limited to bleeding, infection, pneumothorax, perforation,  tamponade, vascular damage, renal failure, MI, stroke, death, inappropriate shocks, and lead dislodgement and wishes to proceed.  We will therefore schedule device implantation at the next available time.  Plan for Medtronic VVI ICD.  Will need to hold his aspirin for 5 days prior to the procedure.  #PVCs Appear to be successfully suppressed using mexiletine.  Continue mexiletine 200 mg by mouth twice daily.  #Hypertension At  goal today.  Recommend checking blood pressures 1-2 times per week at home and recording the values.  Recommend bringing these recordings to the primary care physician.      Signed, Rossie Muskrat. Lalla Brothers, MD, [facility], [facility] 01/10/2023 3:43 PM    Electrophysiology  [facility]

## 2023-01-10 NOTE — Telephone Encounter (Signed)
Pt returning phone call  Please advise

## 2023-01-10 NOTE — Patient Instructions (Signed)
Medication Instructions:  Your physician recommends that you continue on your current medications as directed. Please refer to the Current Medication list given to you today.  *If you need a refill on your cardiac medications before your next appointment, please call your pharmacy*  Lab Work: BMET and CBC You will get your lab work at [facility]) hospital.  Your lab work will be done at the [facility] mall next to [employer].  These are walk in labs- you will not need an appointment and you do not need to be fasting.    Testing/Procedures: Your physician has recommended that you have a defibrillator inserted. An implantable cardioverter defibrillator (ICD) is a small device that is placed in your chest or, in rare cases, your abdomen. This device uses electrical pulses or shocks to help control life-threatening, irregular heartbeats that could lead the heart to suddenly stop beating (sudden cardiac arrest). Leads are attached to the ICD that goes into your heart. This is done in the hospital and usually requires an overnight stay. Please see the instruction sheet given to you today for more information.  Follow-Up: At [facility], you and your health needs are our priority.  As part of our continuing mission to provide you with exceptional heart care, we have created designated Provider Care Teams.  These Care Teams include your primary Cardiologist (physician) and Advanced Practice Providers (APPs -  Physician Assistants and Nurse Practitioners) who all work together to provide you with the care you need, when you need it.  Your next appointment:   We will call you to arrange your follow up appointments.

## 2023-01-11 ENCOUNTER — Telehealth: Payer: Self-pay | Admitting: Cardiology

## 2023-01-11 MED ORDER — SEMAGLUTIDE(0.25 OR 0.5MG/DOS) 2 MG/3ML ~~LOC~~ SOPN: 0.5000 mg | PEN_INJECTOR | SUBCUTANEOUS | 0 refills | Status: DC

## 2023-01-11 NOTE — Telephone Encounter (Signed)
Spoke with the patient and his wife who state that they lost the instruction letters for the patient's procedure. They would like to come back by and pick up a copy. They state they are about 15-20 minutes away. Will send message to [place] triage to assist.

## 2023-01-11 NOTE — Telephone Encounter (Signed)
Procedure instruction letter printed and placed at the front desk for the patient.

## 2023-01-11 NOTE — Telephone Encounter (Signed)
Patient states they he lost his instruction sheet, calling our office to see if we can print him out another one. He will come by the office to pick up. Please advise

## 2023-01-11 NOTE — Telephone Encounter (Signed)
Spoke with pt. Tolerating Ozempic well, glucose > 200 range. No GI upset. Injects on Saturdays. Reviewed weight loss and glycemic control that are better with dose increase, as well as added cardiac benefit with Ozempic. He would like to increase his dose. Rx sent in for 0.5mg  weekly. Will call pt monthly for dose titrations up to maintenance dose. Pt appreciative for the call.

## 2023-01-22 ENCOUNTER — Other Ambulatory Visit: Payer: Self-pay

## 2023-01-22 ENCOUNTER — Observation Stay
Admission: EM | Admit: 2023-01-22 | Discharge: 2023-01-25 | Disposition: A | Discharge status: Home / Self Care | Payer: Medicare HMO | Attending: Internal Medicine | Admitting: Internal Medicine

## 2023-01-22 ENCOUNTER — Emergency Department: Payer: Medicare HMO

## 2023-01-22 ENCOUNTER — Observation Stay: Payer: Medicare HMO

## 2023-01-22 DIAGNOSIS — R079 Chest pain, unspecified: Secondary | ICD-10-CM | POA: Diagnosis not present

## 2023-01-22 DIAGNOSIS — E785 Hyperlipidemia, unspecified: Secondary | ICD-10-CM

## 2023-01-22 DIAGNOSIS — I5022 Chronic systolic (congestive) heart failure: Secondary | ICD-10-CM | POA: Diagnosis not present

## 2023-01-22 DIAGNOSIS — E1142 Type 2 diabetes mellitus with diabetic polyneuropathy: Secondary | ICD-10-CM

## 2023-01-22 DIAGNOSIS — I10 Essential (primary) hypertension: Secondary | ICD-10-CM

## 2023-01-22 DIAGNOSIS — I639 Cerebral infarction, unspecified: Secondary | ICD-10-CM | POA: Diagnosis present

## 2023-01-22 DIAGNOSIS — R200 Anesthesia of skin: Principal | ICD-10-CM

## 2023-01-22 DIAGNOSIS — I5023 Acute on chronic systolic (congestive) heart failure: Secondary | ICD-10-CM | POA: Diagnosis not present

## 2023-01-22 DIAGNOSIS — I110 Hypertensive heart disease with heart failure: Secondary | ICD-10-CM | POA: Insufficient documentation

## 2023-01-22 DIAGNOSIS — I2510 Atherosclerotic heart disease of native coronary artery without angina pectoris: Secondary | ICD-10-CM | POA: Diagnosis not present

## 2023-01-22 DIAGNOSIS — Z8673 Personal history of transient ischemic attack (TIA), and cerebral infarction without residual deficits: Secondary | ICD-10-CM | POA: Diagnosis present

## 2023-01-22 DIAGNOSIS — Z79899 Other long term (current) drug therapy: Secondary | ICD-10-CM | POA: Insufficient documentation

## 2023-01-22 DIAGNOSIS — G459 Transient cerebral ischemic attack, unspecified: Secondary | ICD-10-CM | POA: Diagnosis not present

## 2023-01-22 DIAGNOSIS — Z96653 Presence of artificial knee joint, bilateral: Secondary | ICD-10-CM | POA: Insufficient documentation

## 2023-01-22 DIAGNOSIS — Z87891 Personal history of nicotine dependence: Secondary | ICD-10-CM | POA: Insufficient documentation

## 2023-01-22 DIAGNOSIS — R29818 Other symptoms and signs involving the nervous system: Principal | ICD-10-CM | POA: Insufficient documentation

## 2023-01-22 DIAGNOSIS — Z7982 Long term (current) use of aspirin: Secondary | ICD-10-CM | POA: Diagnosis not present

## 2023-01-22 DIAGNOSIS — R29810 Facial weakness: Secondary | ICD-10-CM | POA: Diagnosis present

## 2023-01-22 HISTORY — DX: Chest pain, unspecified: R07.9

## 2023-01-22 LAB — COMPREHENSIVE METABOLIC PANEL
ALT: 15 U/L (ref 0–44)
AST: 20 U/L (ref 15–41)
Albumin: 4.1 g/dL (ref 3.5–5.0)
Alkaline Phosphatase: 120 U/L (ref 38–126)
Anion gap: 9 (ref 5–15)
BUN: 22 mg/dL (ref 8–23)
CO2: 24 mmol/L (ref 22–32)
Calcium: 9.4 mg/dL (ref 8.9–10.3)
Chloride: 102 mmol/L (ref 98–111)
Creatinine, Ser: 2.12 mg/dL — ABNORMAL HIGH (ref 0.61–1.24)
GFR, Estimated: 33 mL/min — ABNORMAL LOW (ref >60)
Glucose, Bld: 273 mg/dL — ABNORMAL HIGH (ref 70–99)
Potassium: 4.2 mmol/L (ref 3.5–5.1)
Sodium: 135 mmol/L (ref 135–145)
Total Bilirubin: 0.8 mg/dL (ref 0.3–1.2)
Total Protein: 7.2 g/dL (ref 6.5–8.1)

## 2023-01-22 LAB — CBC WITH DIFFERENTIAL/PLATELET
Abs Immature Granulocytes: 0.02 10*3/uL (ref 0.00–0.07)
Basophils Absolute: 0 10*3/uL (ref 0.0–0.1)
Basophils Relative: 0 %
Eosinophils Absolute: 0.1 10*3/uL (ref 0.0–0.5)
Eosinophils Relative: 1 %
HCT: 45.4 % (ref 39.0–52.0)
Hemoglobin: 14.7 g/dL (ref 13.0–17.0)
Immature Granulocytes: 0 %
Lymphocytes Relative: 19 %
Lymphs Abs: 1.8 10*3/uL (ref 0.7–4.0)
MCH: 28.8 pg (ref 26.0–34.0)
MCHC: 32.4 g/dL (ref 30.0–36.0)
MCV: 89 fL (ref 80.0–100.0)
Monocytes Absolute: 0.6 10*3/uL (ref 0.1–1.0)
Monocytes Relative: 6 %
Neutro Abs: 7 10*3/uL (ref 1.7–7.7)
Neutrophils Relative %: 74 %
Platelets: 157 10*3/uL (ref 150–400)
RBC: 5.1 MIL/uL (ref 4.22–5.81)
RDW: 12.4 % (ref 11.5–15.5)
WBC: 9.5 10*3/uL (ref 4.0–10.5)
nRBC: 0 % (ref 0.0–0.2)

## 2023-01-22 LAB — HEMOGLOBIN A1C
Hgb A1c MFr Bld: 10.5 % — ABNORMAL HIGH (ref 4.8–5.6)
Mean Plasma Glucose: 254.65 mg/dL

## 2023-01-22 LAB — URINALYSIS, ROUTINE W REFLEX MICROSCOPIC
Bilirubin Urine: NEGATIVE
Glucose, UA: 50 mg/dL — AB
Hgb urine dipstick: NEGATIVE
Ketones, ur: NEGATIVE mg/dL
Leukocytes,Ua: NEGATIVE
Nitrite: NEGATIVE
Protein, ur: 100 mg/dL — AB
Specific Gravity, Urine: 1.018 (ref 1.005–1.030)
pH: 5 (ref 5.0–8.0)

## 2023-01-22 LAB — CBG MONITORING, ED: Glucose-Capillary: 268 mg/dL — ABNORMAL HIGH (ref 70–99)

## 2023-01-22 LAB — BASIC METABOLIC PANEL
Anion gap: 10 (ref 5–15)
BUN: 20 mg/dL (ref 8–23)
CO2: 21 mmol/L — ABNORMAL LOW (ref 22–32)
Calcium: 9.3 mg/dL (ref 8.9–10.3)
Chloride: 102 mmol/L (ref 98–111)
Creatinine, Ser: 1.93 mg/dL — ABNORMAL HIGH (ref 0.61–1.24)
GFR, Estimated: 37 mL/min — ABNORMAL LOW (ref >60)
Glucose, Bld: 287 mg/dL — ABNORMAL HIGH (ref 70–99)
Potassium: 4.3 mmol/L (ref 3.5–5.1)
Sodium: 133 mmol/L — ABNORMAL LOW (ref 135–145)

## 2023-01-22 LAB — TROPONIN I (HIGH SENSITIVITY)
Troponin I (High Sensitivity): 11 ng/L (ref <18)
Troponin I (High Sensitivity): 8 ng/L (ref <18)
Troponin I (High Sensitivity): 9 ng/L (ref <18)
Troponin I (High Sensitivity): 9 ng/L (ref <18)

## 2023-01-22 LAB — CBC
HCT: 45.3 % (ref 39.0–52.0)
Hemoglobin: 14.8 g/dL (ref 13.0–17.0)
MCH: 29.2 pg (ref 26.0–34.0)
MCHC: 32.7 g/dL (ref 30.0–36.0)
MCV: 89.3 fL (ref 80.0–100.0)
Platelets: 162 10*3/uL (ref 150–400)
RBC: 5.07 MIL/uL (ref 4.22–5.81)
RDW: 12.5 % (ref 11.5–15.5)
WBC: 9.4 10*3/uL (ref 4.0–10.5)
nRBC: 0 % (ref 0.0–0.2)

## 2023-01-22 LAB — URINE DRUG SCREEN, QUALITATIVE (ARMC ONLY)
Amphetamines, Ur Screen: NOT DETECTED
Barbiturates, Ur Screen: NOT DETECTED
Benzodiazepine, Ur Scrn: NOT DETECTED
Cannabinoid 50 Ng, Ur ~~LOC~~: NOT DETECTED
Cocaine Metabolite,Ur ~~LOC~~: NOT DETECTED
MDMA (Ecstasy)Ur Screen: NOT DETECTED
Methadone Scn, Ur: NOT DETECTED
Opiate, Ur Screen: NOT DETECTED
Phencyclidine (PCP) Ur S: NOT DETECTED
Tricyclic, Ur Screen: NOT DETECTED

## 2023-01-22 LAB — PROTIME-INR
INR: 1.1 (ref 0.8–1.2)
Prothrombin Time: 14.5 seconds (ref 11.4–15.2)

## 2023-01-22 LAB — ETHANOL: Alcohol, Ethyl (B): <10 mg/dL (ref <10)

## 2023-01-22 LAB — APTT: aPTT: 28 seconds (ref 24–36)

## 2023-01-22 MED ORDER — ACETAMINOPHEN 650 MG RE SUPP: 650.0000 mg | Freq: Four times a day (QID) | RECTAL | Status: DC | PRN

## 2023-01-22 MED ORDER — TRAZODONE HCL 50 MG PO TABS: 25.0000 mg | ORAL_TABLET | Freq: Every evening | ORAL | Status: DC | PRN

## 2023-01-22 MED ORDER — MEXILETINE HCL 200 MG PO CAPS
200.0000 mg | ORAL_CAPSULE | Freq: Two times a day (BID) | ORAL | Status: DC
  Administered 2023-01-22 – 2023-01-25 (×7): 200 mg via ORAL
  Filled 2023-01-22 (×7): qty 1

## 2023-01-22 MED ORDER — BRINZOLAMIDE 1 % OP SUSP
1.0000 [drp] | Freq: Three times a day (TID) | OPHTHALMIC | Status: DC
  Administered 2023-01-22 – 2023-01-25 (×9): 1 [drp] via OPHTHALMIC
  Filled 2023-01-22: qty 10

## 2023-01-22 MED ORDER — VITAMIN D 50 MCG (2000 UT) PO TABS: 4000.0000 [IU] | ORAL_TABLET | Freq: Every day | ORAL | Status: DC

## 2023-01-22 MED ORDER — TIMOLOL MALEATE 0.5 % OP SOLG
1.0000 [drp] | Freq: Every day | OPHTHALMIC | Status: DC
  Filled 2023-01-22: qty 5

## 2023-01-22 MED ORDER — ADULT MULTIVITAMIN W/MINERALS CH
1.0000 | ORAL_TABLET | Freq: Every day | ORAL | Status: DC
  Administered 2023-01-22 – 2023-01-25 (×4): 1 via ORAL
  Filled 2023-01-22 (×4): qty 1

## 2023-01-22 MED ORDER — DOCUSATE SODIUM 100 MG PO CAPS
100.0000 mg | ORAL_CAPSULE | Freq: Every day | ORAL | Status: DC | PRN
  Administered 2023-01-25: 100 mg via ORAL
  Filled 2023-01-22: qty 1

## 2023-01-22 MED ORDER — ONDANSETRON HCL 4 MG/2ML IJ SOLN: 4.0000 mg | Freq: Four times a day (QID) | INTRAMUSCULAR | Status: DC | PRN

## 2023-01-22 MED ORDER — FUROSEMIDE 10 MG/ML IJ SOLN
40.0000 mg | Freq: Two times a day (BID) | INTRAMUSCULAR | Status: DC
  Administered 2023-01-22: 40 mg via INTRAVENOUS
  Filled 2023-01-22: qty 4

## 2023-01-22 MED ORDER — CARVEDILOL 25 MG PO TABS
25.0000 mg | ORAL_TABLET | Freq: Two times a day (BID) | ORAL | Status: DC
  Administered 2023-01-22 – 2023-01-25 (×6): 25 mg via ORAL
  Filled 2023-01-22 (×6): qty 1

## 2023-01-22 MED ORDER — BRIMONIDINE TARTRATE 0.2 % OP SOLN
1.0000 [drp] | Freq: Three times a day (TID) | OPHTHALMIC | Status: DC
  Administered 2023-01-22 – 2023-01-25 (×9): 1 [drp] via OPHTHALMIC
  Filled 2023-01-22: qty 5

## 2023-01-22 MED ORDER — ACETAMINOPHEN 325 MG PO TABS: 650.0000 mg | ORAL_TABLET | Freq: Four times a day (QID) | ORAL | Status: DC | PRN

## 2023-01-22 MED ORDER — POTASSIUM CHLORIDE CRYS ER 20 MEQ PO TBCR
40.0000 meq | EXTENDED_RELEASE_TABLET | Freq: Two times a day (BID) | ORAL | Status: DC
  Administered 2023-01-22 – 2023-01-23 (×3): 40 meq via ORAL
  Filled 2023-01-22 (×3): qty 2

## 2023-01-22 MED ORDER — TIMOLOL MALEATE 0.5 % OP SOLN
1.0000 [drp] | Freq: Every day | OPHTHALMIC | Status: DC
  Administered 2023-01-23 – 2023-01-25 (×3): 1 [drp] via OPHTHALMIC
  Filled 2023-01-22 (×2): qty 5

## 2023-01-22 MED ORDER — AMLODIPINE BESYLATE 10 MG PO TABS
10.0000 mg | ORAL_TABLET | Freq: Every day | ORAL | Status: DC
  Administered 2023-01-22 – 2023-01-25 (×3): 10 mg via ORAL
  Filled 2023-01-22: qty 2
  Filled 2023-01-22 (×2): qty 1

## 2023-01-22 MED ORDER — DULOXETINE HCL 30 MG PO CPEP
60.0000 mg | ORAL_CAPSULE | Freq: Every day | ORAL | Status: DC
  Administered 2023-01-22 – 2023-01-25 (×4): 60 mg via ORAL
  Filled 2023-01-22 (×2): qty 2
  Filled 2023-01-22: qty 1
  Filled 2023-01-22: qty 2

## 2023-01-22 MED ORDER — MAGNESIUM HYDROXIDE 400 MG/5ML PO SUSP: 30.0000 mL | Freq: Every day | ORAL | Status: DC | PRN

## 2023-01-22 MED ORDER — SODIUM CHLORIDE 0.9 % IV SOLN: INTRAVENOUS | Status: DC

## 2023-01-22 MED ORDER — ONDANSETRON HCL 4 MG PO TABS: 4.0000 mg | ORAL_TABLET | Freq: Four times a day (QID) | ORAL | Status: DC | PRN

## 2023-01-22 MED ORDER — PANTOPRAZOLE SODIUM 40 MG PO TBEC
40.0000 mg | DELAYED_RELEASE_TABLET | Freq: Every day | ORAL | Status: DC
  Administered 2023-01-22 – 2023-01-25 (×4): 40 mg via ORAL
  Filled 2023-01-22 (×4): qty 1

## 2023-01-22 MED ORDER — SPIRONOLACTONE 25 MG PO TABS
25.0000 mg | ORAL_TABLET | Freq: Every day | ORAL | Status: DC
  Administered 2023-01-22: 25 mg via ORAL
  Filled 2023-01-22: qty 1

## 2023-01-22 MED ORDER — STROKE: EARLY STAGES OF RECOVERY BOOK: Freq: Once | Status: AC

## 2023-01-22 MED ORDER — ASPIRIN 81 MG PO TBEC
81.0000 mg | DELAYED_RELEASE_TABLET | Freq: Every day | ORAL | Status: DC
  Administered 2023-01-22 – 2023-01-25 (×4): 81 mg via ORAL
  Filled 2023-01-22 (×4): qty 1

## 2023-01-22 MED ORDER — LATANOPROST 0.005 % OP SOLN
1.0000 [drp] | Freq: Every day | OPHTHALMIC | Status: DC
  Administered 2023-01-22 – 2023-01-24 (×3): 1 [drp] via OPHTHALMIC
  Filled 2023-01-22: qty 2.5

## 2023-01-22 MED ORDER — SACUBITRIL-VALSARTAN 97-103 MG PO TABS
1.0000 | ORAL_TABLET | Freq: Two times a day (BID) | ORAL | Status: DC
  Administered 2023-01-22: 1 via ORAL
  Filled 2023-01-22 (×2): qty 1

## 2023-01-22 MED ORDER — OMEGA-3 1000 MG PO CAPS: 2000.0000 mg | ORAL_CAPSULE | Freq: Three times a day (TID) | ORAL | Status: DC

## 2023-01-22 MED ORDER — ENOXAPARIN SODIUM 40 MG/0.4ML IJ SOSY
40.0000 mg | PREFILLED_SYRINGE | INTRAMUSCULAR | Status: DC
  Administered 2023-01-22: 40 mg via SUBCUTANEOUS
  Filled 2023-01-22: qty 0.4

## 2023-01-22 MED ORDER — OXCARBAZEPINE 150 MG PO TABS
150.0000 mg | ORAL_TABLET | Freq: Two times a day (BID) | ORAL | Status: DC
  Administered 2023-01-22 – 2023-01-25 (×7): 150 mg via ORAL
  Filled 2023-01-22 (×7): qty 1

## 2023-01-22 MED ORDER — SEMAGLUTIDE(0.25 OR 0.5MG/DOS) 2 MG/3ML ~~LOC~~ SOPN: 0.5000 mg | PEN_INJECTOR | SUBCUTANEOUS | Status: DC

## 2023-01-22 MED ORDER — IOHEXOL 350 MG/ML SOLN
75.0000 mL | Freq: Once | INTRAVENOUS | Status: AC | PRN
  Administered 2023-01-22: 75 mL via INTRAVENOUS

## 2023-01-22 MED ORDER — ATORVASTATIN CALCIUM 20 MG PO TABS
40.0000 mg | ORAL_TABLET | Freq: Every day | ORAL | Status: DC
  Administered 2023-01-22 – 2023-01-24 (×3): 40 mg via ORAL
  Filled 2023-01-22 (×3): qty 2

## 2023-01-22 NOTE — ED Notes (Signed)
Dr. Bradler at bedside 

## 2023-01-22 NOTE — Consult Note (Signed)
NEURO HOSPITALIST CONSULT NOTE   Requestig physician: Dr. Vicente Males  Reason for Consult: Acute onset of left face and body numbness with slurred speech  History obtained from:  Patient, Wife and Chart     HPI:                                                                                                                                          Erik Black is an 72 y.o. male with a PMHx of anxiety, arthritis, CHF, CAD, depression, DM, GERD, glaucoma, HTN, HLD and sleep apnea (compliant with CPAP) who presents to the ED for evaluation of acute onset dizziness, slurred speech and CP. First symptom was CP which began at 0900. He then was speaking during a teleconferenced church meeting when he suddenly felt as though his speech was off, initially described as slurred, but revealed on further questioning to be more of a difficulty in finding the words to say while speaking. No speech comprehension deficit was noted by the patient. This was followed by an episode of dizziness when standing, which then resolved with sitting again. He then decided to walk his dog and on the way out of his residence, he again became dizzy and his wife had to grab him from behind to steady him. At that time they decided to go to the ED via POV for assessment. Of note, his symptoms improved again when he sat down in the car. He was brought to the ED examination room from his car via wheelchair.   He describes his dizziness as a sensation of unsteadiness, as though "on a balance ball". He also endorses dimming of his vision bilaterally as well as a sense of fatigue as though he was about to fall out in association with the dizziness. Both occurrences were associated with standing up from a seated position, but he says "no" when asked if he felt like he was going to faint, clarifying with the above detailed description of his symptoms.   He has experienced a sensation of "skipped beats" with his heart  recently. He has also had some CP today. He has a history of CHF and is scheduled for ICD placement on 01/28/2023. He stopped his Ozempic and ASA today in preparation for the procedure, per his Cardiologist's instructions.   After he arrived to the ED, he also endorsed some left sided facial numbness as well as LUE and LLE numbness without weakness. Code Stroke was then called. LKN 0900.   Home medications include ASA and atorvastatin.   Past Medical History:  Diagnosis Date   Anxiety    Arthritis    CHF (congestive heart failure) (HCC)    Depression    Diabetes mellitus without complication (HCC)    GERD (gastroesophageal reflux disease)  Glaucoma    both eyes   Hypertension    Sleep apnea    uses Cpap nightly    Past Surgical History:  Procedure Laterality Date   CARPAL TUNNEL RELEASE Bilateral    COLONOSCOPY     EYE SURGERY Bilateral    cataract surgery with lens implants   KNEE ARTHROPLASTY Right    KNEE ARTHROPLASTY Left    KNEE ARTHROSCOPY Right    KNEE ARTHROSCOPY Left    MULTIPLE TOOTH EXTRACTIONS     RIGHT/LEFT HEART CATH AND CORONARY ANGIOGRAPHY Bilateral 05/03/2022   Procedure: RIGHT/LEFT HEART CATH AND CORONARY ANGIOGRAPHY;  Surgeon: Marykay Lex, MD;  Location: [facility];  Service: Cardiovascular;  Laterality: Bilateral;   ROTATOR CUFF REPAIR Right    ROTATOR CUFF REPAIR W/ DISTAL CLAVICLE EXCISION Left    TOTAL HIP ARTHROPLASTY Right 05/11/2019   Procedure: RIGHT TOTAL HIP ARTHROPLASTY ANTERIOR APPROACH;  Surgeon: Tarry Kos, MD;  Location: [facility] OR;  Service: Orthopedics;  Laterality: Right;   TOTAL HIP ARTHROPLASTY Left 11/23/2019   TOTAL HIP ARTHROPLASTY Left 11/23/2019   Procedure: LEFT TOTAL HIP ARTHROPLASTY ANTERIOR APPROACH;  Surgeon: Tarry Kos, MD;  Location: [facility] OR;  Service: Orthopedics;  Laterality: Left;   VASECTOMY      Family History  Problem Relation Age of Onset   Hypertension Mother    Diabetes Mother    Heart disease  Mother    Heart disease Father    Heart disease Brother               Social History:  reports that he quit smoking about 3 years ago. His smoking use included cigarettes. He has never used smokeless tobacco. He reports that he does not currently use alcohol. He reports that he does not currently use drugs.  Allergies  Allergen Reactions   Empagliflozin Other (See Comments)    Yeast infection  Other Reaction(s): Bleeding skin   Methadone    Oxycodone Itching    Hands started peeling and "could not swallow"   Lisinopril Cough    MEDICATIONS:                                                                                                                     No current facility-administered medications on file prior to encounter.   Current Outpatient Medications on File Prior to Encounter  Medication Sig Dispense Refill   amLODipine (NORVASC) 10 MG tablet Take 10 mg by mouth daily.     aspirin EC 81 MG tablet Take 1 tablet (81 mg total) by mouth 2 (two) times daily. 84 tablet 0   atorvastatin (LIPITOR) 40 MG tablet Take 1 tablet (40 mg total) by mouth at bedtime. 90 tablet 3   BREZTRI AEROSPHERE 160-9-4.8 MCG/ACT AERO Inhale 1 puff into the lungs 2 (two) times daily.     Brinzolamide-Brimonidine 1-0.2 % SUSP Apply 1 drop to eye 3 (three) times daily.     carvedilol (COREG) 25 MG tablet TAKE 1  TABLET BY MOUTH TWICE A DAY 180 tablet 1   Cholecalciferol (VITAMIN D) 50 MCG (2000 UT) tablet Take 4,000 Units by mouth daily.      docusate sodium (COLACE) 100 MG capsule Take 100 mg by mouth daily as needed.     DULoxetine (CYMBALTA) 60 MG capsule Take 60 mg by mouth daily.     KLOR-CON M20 20 MEQ tablet TAKE 2 TABLETS BY MOUTH EVERY MORNING AND 1 TABLET IN THE EVENING (Patient taking differently: Take 40 mEq by mouth 2 (two) times daily.) 270 tablet 1   latanoprost (XALATAN) 0.005 % ophthalmic solution Place 1 drop into both eyes at bedtime.     mexiletine (MEXITIL) 200 MG capsule TAKE 1  CAPSULE BY MOUTH TWICE A DAY 90 capsule 3   Multiple Vitamins-Minerals (MULTIVITAMIN WITH MINERALS) tablet Take 1 tablet by mouth daily.     Omega-3 1000 MG CAPS Take 2,000 mg by mouth 3 (three) times daily after meals.     omeprazole (PRILOSEC) 20 MG capsule Take 20 mg by mouth 2 (two) times daily.     OXcarbazepine (TRILEPTAL) 150 MG tablet Take 150 mg by mouth 2 (two) times daily.     sacubitril-valsartan (ENTRESTO) 97-103 MG Take 1 tablet by mouth 2 (two) times daily. 60 tablet 6   Semaglutide,0.25 or 0.5MG /DOS, 2 MG/3ML SOPN Inject 0.5 mg into the skin once a week. 3 mL 0   spironolactone (ALDACTONE) 25 MG tablet TAKE 1 TABLET (25 MG TOTAL) BY MOUTH DAILY. 90 tablet 2   timolol (TIMOPTIC-XR) 0.5 % ophthalmic gel-forming Place 1 drop into both eyes daily.     torsemide (DEMADEX) 20 MG tablet TAKE 2 TABLETS (40 MG TOTAL) BY MOUTH DAILY. 180 tablet 1     Scheduled:  [START ON 01/23/2023]  stroke: early stages of recovery book   Does not apply Once   amLODipine  10 mg Oral Daily   aspirin EC  81 mg Oral BID   atorvastatin  40 mg Oral QHS   brinzolamide  1 drop Both Eyes TID   And   brimonidine  1 drop Both Eyes TID   carvedilol  25 mg Oral BID   DULoxetine  60 mg Oral Daily   enoxaparin (LOVENOX) injection  40 mg Subcutaneous Q24H   latanoprost  1 drop Both Eyes QHS   mexiletine  200 mg Oral BID   multivitamin with minerals  1 tablet Oral Daily   Omega-3  2,000 mg Oral TID PC   OXcarbazepine  150 mg Oral BID   pantoprazole  40 mg Oral Daily   potassium chloride SA  40 mEq Oral BID   sacubitril-valsartan  1 tablet Oral BID   Semaglutide(0.25 or 0.5MG /DOS)  0.5 mg Subcutaneous Weekly   spironolactone  25 mg Oral Daily   timolol  1 drop Both Eyes Daily   Vitamin D  4,000 Units Oral Daily   Continuous:  sodium chloride       ROS:  He also endorses  a sensation of central chest pain and skipped heart beats. Other ROS as per HPI.    Blood pressure 100/77, pulse 89, temperature 97.9 F (36.6 C), temperature source Oral, resp. rate 18, height 6' (1.829 m), weight 124.5 kg, SpO2 98%.   General Examination:                                                                                                       Physical Exam HEENT- [place]/AT   Lungs- Respirations unlabored Extremities- Warm and well-perfused  Neurological Examination Mental Status: Awake and alert. Fully oriented. Thought content appropriate.  Speech fluent without evidence of aphasia. No dysarthria. Naming and comprehension intact. Able to follow all commands without difficulty. Cranial Nerves: II: Temporal visual fields intact with no extinction to DSS. PERRL. III,IV, VI: No ptosis. EOMI. No nystagmus. V: Subjectively decreased temperature sensation in the RIGHT V1-V3 distribution.  VII: Smile symmetric VIII: Hearing intact to voice IX,X: No hypophonia or hoarseness XI: Symmetric XII: Midline tongue extension Motor: RUE: 5/5 LUE: 5/5 RLE: 5/5 LLE: 5/5 Sensory: Subjectively decreased temp and pressure sensation to LUE. Normal temp and FT sensation to RUE and bilateral lower extremities to the ankles. Decreased temp and FT sensation to soles of feet bilaterally. No extinction to DSS. Deep Tendon Reflexes: 1+ and symmetric bilateral biceps and brachioradialis. Unelicitable patellar reflexes in the context of prior knee operations.  Plantars: Mute bilaterally.  Cerebellar: No ataxia with FNF bilaterally Gait: Deferred   Lab Results: Basic Metabolic Panel: Recent Labs  Lab 01/22/23 0935  NA 133*  K 4.3  CL 102  CO2 21*  GLUCOSE 287*  BUN 20  CREATININE 1.93*  CALCIUM 9.3    CBC: Recent Labs  Lab 01/22/23 0935  WBC 9.4  HGB 14.8  HCT 45.3  MCV 89.3  PLT 162    Cardiac Enzymes: No results for input(s): "CKTOTAL", "CKMB", "CKMBINDEX", "TROPONINI" in  the last 168 hours.  Lipid Panel: No results for input(s): "CHOL", "TRIG", "HDL", "CHOLHDL", "VLDL", "LDLCALC" in the last 168 hours.  Imaging: CT HEAD WO CONTRAST ( )  Result Date: 01/22/2023 CLINICAL DATA:  Altered mental status, nontraumatic (Ped 0-17y) EXAM: CT HEAD WITHOUT CONTRAST TECHNIQUE: Contiguous axial images were obtained from the base of the skull through the vertex without intravenous contrast. RADIATION DOSE REDUCTION: This exam was performed according to the departmental dose-optimization program which includes automated exposure control, adjustment of the mA and/or kV according to patient size and/or use of iterative reconstruction technique. COMPARISON:  None Available. FINDINGS: Brain: No evidence of acute infarction, hemorrhage, hydrocephalus, extra-axial collection or mass lesion/mass effect. Sequela of mild chronic microvascular ischemic change. Vascular: No hyperdense vessel or unexpected calcification. Skull: Normal. Negative for fracture or focal lesion. Sinuses/Orbits: No middle ear or mastoid effusion. Paranasal sinuses are notable for polypoid mucosal thickening in the right maxillary sinus. Bilateral lens replacement. Orbits are otherwise unremarkable. Other: None. IMPRESSION: No acute intracranial abnormality. Electronically Signed   By: Lorenza Cambridge M.D.   On: 01/22/2023 10:27   DG Chest 2 View  Result Date: 01/22/2023 CLINICAL DATA:  Chest pain EXAM: CHEST - 2 VIEW COMPARISON:  06/01/2022 FINDINGS: Eventration of the diaphragm. There is some linear opacity along the lung bases likely scar or atelectasis. No consolidation, pneumothorax or edema. Normal cardiopericardial silhouette. Calcified aorta. Degenerative changes of the spine with bridging syndesmophytes diffusely. Films are under penetrated. IMPRESSION: Under penetrated radiographs. Basilar areas of scarring and atelectatic change. Electronically Signed   By: Karen Kays M.D.   On: 01/22/2023 10:22     Assessment: 72 year old male with DM, HTN and a history of cardiac arrhythmia presenting with acute onset of left face and arm numbness with slurred speech and dizziness - Exam reveals subjectively decreased temperature sensation in the RIGHT V1-V3 distribution in addition to subjectively decreased temp and pressure sensation to the LUE. No extinction to DSS. Strength is normal and there is no ataxia. - CT head: No evidence of acute infarction, hemorrhage, hydrocephalus, extra-axial collection or mass lesion/mass effect. Sequela of mild chronic microvascular ischemic change.   - TNK is not indicated due to symptoms being too mild to treat. Overall presentation is not consistent with LVO.  - DDx includes transient hypotension from possible cardiac arrhythmia versus a small acute stroke.   Recommendations: 1. HgbA1c, fasting lipid panel 2. MRI/MRA of the brain without contrast 3. PT consult, OT consult, Speech consult 4. Echocardiogram 5. Carotid ultrasound 6. Continue ASA and atorvastatin. May make changes pending results of stroke work up.   7. Risk factor modification 8. Telemetry monitoring 9. Frequent neuro checks 10 NPO until passes stroke swallow screen 11. May benefit from Nephrology consult given that the patient states he has no history of renal disease, but with elevated Cr on labs.  12. Permissive HTN x 24 hours.  13. Cardiology consult.  14. Orthostatics.  Electronically signed: Dr. Caryl Pina 01/22/2023, 10:35 AM

## 2023-01-22 NOTE — ED Notes (Signed)
CODE  STROKE  CALLED  TO  [facility]  

## 2023-01-22 NOTE — H&P (Addendum)
[place]   PATIENT NAME: Erik Black    MR#:  [id number]  DATE OF BIRTH:  06-Aug-1950  DATE OF ADMISSION:  01/22/2023  PRIMARY CARE PHYSICIAN: [facility]   Patient is coming from: Home  REQUESTING/REFERRING PHYSICIAN: Donna Bernard, MD  CHIEF COMPLAINT:   Chief Complaint  Patient presents with   Altered Mental Status   Chest Pain    HISTORY OF PRESENT ILLNESS:  Catherine Oak is a 72 y.o. African-American male with medical history significant for CHF, GERD, hypertension, OSA on CPAP, anxiety and depression, who presented to the emergency room with acute onset of altered mental status with disorientation and lightheadedness as well as feeling off balance when he was sitting and get up at 9 AM.  He stated he could not move forward or backward.  He went to the bedroom after that started having slurred speech and felt left-sided tingling and numbness in the upper and lower extremity.  His left side felt heavy.  He has been having chest pain feeling like pressure and graded 6/10 in severity with associated dyspnea without palpitations lately including today that resolved spontaneously.  He admitted to orthopnea as well as dyspnea on exertion without lower extremity edema or paroxysmal nocturnal dyspnea.  No dysuria, oliguria or hematuria or flank pain.  No tinnitus or vertigo.  No urinary or stool incontinence.  No witnessed seizures.  ED Course: Upon arrival to the emergency room, vital signs were within normal.  Labs revealed sodium 133, CO2 21 and glucose of 273 with creatinine 1.93.  High sensitive troponin I was 11 and later 8.  CBC was within normal.  Coag profile was within normal.  UA showed 50 glucose 100 protein was otherwise unremarkable.  Urine drug screen was negative and alcohol level was less than 10.    EKG as reviewed by me : EKG showed normal sinus rhythm with a rate of 82 with poor R wave progression. Imaging: 2 view chest x-ray with basilar  scarring and atelectatic changes and was underpenetrated. Noncontrasted head CT scan revealed no acute intracranial abnormality. Chest CTA revealed the following: 1. Limited sensitivity of the exam for pulmonary embolism due to bolus timing. Given this limitation, no large pulmonary embolism. 2. Mild scattered bilateral ground-glass opacities may reflect atelectasis or mild pulmonary edema. 3. Aortic Atherosclerosis.  Head and neck CTA revealed the following: No emergent large vessel occlusion or proximal hemodynamically significant stenosis.  The patient will was seen in neurology code stroke consultation by Dr. Otelia Limes.  He will be admitted to a medical telemetry observation bed for further evaluation and management. PAST MEDICAL HISTORY:   Past Medical History:  Diagnosis Date   Anxiety    Arthritis    CHF (congestive heart failure) (HCC)    Depression    Diabetes mellitus without complication (HCC)    GERD (gastroesophageal reflux disease)    Glaucoma    both eyes   Hypertension    Sleep apnea    uses Cpap nightly    PAST SURGICAL HISTORY:   Past Surgical History:  Procedure Laterality Date   CARPAL TUNNEL RELEASE Bilateral    COLONOSCOPY     EYE SURGERY Bilateral    cataract surgery with lens implants   KNEE ARTHROPLASTY Right    KNEE ARTHROPLASTY Left    KNEE ARTHROSCOPY Right    KNEE ARTHROSCOPY Left    MULTIPLE TOOTH EXTRACTIONS     RIGHT/LEFT HEART CATH AND CORONARY ANGIOGRAPHY Bilateral 05/03/2022  Procedure: RIGHT/LEFT HEART CATH AND CORONARY ANGIOGRAPHY;  Surgeon: Marykay Lex, MD;  Location: [facility] INVASIVE CV LAB;  Service: Cardiovascular;  Laterality: Bilateral;   ROTATOR CUFF REPAIR Right    ROTATOR CUFF REPAIR W/ DISTAL CLAVICLE EXCISION Left    TOTAL HIP ARTHROPLASTY Right 05/11/2019   Procedure: RIGHT TOTAL HIP ARTHROPLASTY ANTERIOR APPROACH;  Surgeon: Tarry Kos, MD;  Location: [facility] OR;  Service: Orthopedics;  Laterality: Right;   TOTAL HIP  ARTHROPLASTY Left 11/23/2019   TOTAL HIP ARTHROPLASTY Left 11/23/2019   Procedure: LEFT TOTAL HIP ARTHROPLASTY ANTERIOR APPROACH;  Surgeon: Tarry Kos, MD;  Location: [facility] OR;  Service: Orthopedics;  Laterality: Left;   VASECTOMY      SOCIAL HISTORY:   Social History   Tobacco Use   Smoking status: Former    Current packs/day: 0.00    Types: Cigarettes    Quit date: 05/26/2019    Years since quitting: 3.6   Smokeless tobacco: Never   Tobacco comments:    smokes a pack a month  Substance Use Topics   Alcohol use: Not Currently    FAMILY HISTORY:   Family History  Problem Relation Age of Onset   Hypertension Mother    Diabetes Mother    Heart disease Mother    Heart disease Father    Heart disease Brother     DRUG ALLERGIES:   Allergies  Allergen Reactions   Empagliflozin Other (See Comments)    Yeast infection  Other Reaction(s): Bleeding skin   Methadone    Oxycodone Itching    Hands started peeling and "could not swallow"   Lisinopril Cough    REVIEW OF SYSTEMS:   ROS As per history of present illness. All pertinent systems were reviewed above. Constitutional, HEENT, cardiovascular, respiratory, GI, GU, musculoskeletal, neuro, psychiatric, endocrine, integumentary and hematologic systems were reviewed and are otherwise negative/unremarkable except for positive findings mentioned above in the HPI.   MEDICATIONS AT HOME:   Prior to Admission medications   Medication Sig Start Date End Date Taking? Authorizing Provider  amLODipine (NORVASC) 10 MG tablet Take 10 mg by mouth daily. 08/16/22 08/17/23 Yes [provider]  aspirin EC 81 MG tablet Take 1 tablet (81 mg total) by mouth 2 (two) times daily. 05/11/19  Yes Tarry Kos, MD  atorvastatin (LIPITOR) 40 MG tablet Take 1 tablet (40 mg total) by mouth at bedtime. 08/01/22  Yes Bensimhon, Bevelyn Buckles, MD  BREZTRI AEROSPHERE 160-9-4.8 MCG/ACT AERO Inhale 1 puff into the lungs 2 (two) times daily. 04/17/22   Yes [provider]  Brinzolamide-Brimonidine 1-0.2 % SUSP Apply 1 drop to eye 3 (three) times daily. 01/04/23  Yes [provider]  carvedilol (COREG) 25 MG tablet TAKE 1 TABLET BY MOUTH TWICE A DAY 11/16/22  Yes Bensimhon, Bevelyn Buckles, MD  Cholecalciferol (VITAMIN D) 50 MCG (2000 UT) tablet Take 4,000 Units by mouth daily.  03/03/19  Yes [provider]  docusate sodium (COLACE) 100 MG capsule Take 100 mg by mouth daily as needed. 07/03/22  Yes [provider]  DULoxetine (CYMBALTA) 60 MG capsule Take 60 mg by mouth daily. 01/07/19  Yes [provider]  KLOR-CON M20 20 MEQ tablet TAKE 2 TABLETS BY MOUTH EVERY MORNING AND 1 TABLET IN THE EVENING Patient taking differently: Take 40 mEq by mouth 2 (two) times daily. 11/16/22  Yes Bensimhon, Bevelyn Buckles, MD  latanoprost (XALATAN) 0.005 % ophthalmic solution Place 1 drop into both eyes at bedtime.   Yes  [provider]  mexiletine (MEXITIL) 200 MG capsule TAKE 1 CAPSULE BY MOUTH TWICE A DAY 10/10/22  Yes Bensimhon, Bevelyn Buckles, MD  Multiple Vitamins-Minerals (MULTIVITAMIN WITH MINERALS) tablet Take 1 tablet by mouth daily.   Yes [provider]  Omega-3 1000 MG CAPS Take 2,000 mg by mouth 3 (three) times daily after meals.   Yes [provider]  omeprazole (PRILOSEC) 20 MG capsule Take 20 mg by mouth 2 (two) times daily.   Yes [provider]  OXcarbazepine (TRILEPTAL) 150 MG tablet Take 150 mg by mouth 2 (two) times daily.   Yes [provider]  sacubitril-valsartan (ENTRESTO) 97-103 MG Take 1 tablet by mouth 2 (two) times daily. 08/14/22  Yes Bensimhon, Bevelyn Buckles, MD  Semaglutide,0.25 or 0.5MG /DOS, 2 MG/3ML SOPN Inject 0.5 mg into the skin once a week. 01/11/23  Yes Bensimhon, Bevelyn Buckles, MD  spironolactone (ALDACTONE) 25 MG tablet TAKE 1 TABLET (25 MG TOTAL) BY MOUTH DAILY. 11/16/22  Yes Bensimhon, Bevelyn Buckles, MD  timolol (TIMOPTIC-XR) 0.5 % ophthalmic gel-forming Place 1 drop into both  eyes daily.   Yes [provider]  torsemide (DEMADEX) 20 MG tablet TAKE 2 TABLETS (40 MG TOTAL) BY MOUTH DAILY. 01/07/23  Yes Bensimhon, Bevelyn Buckles, MD      VITAL SIGNS:  Blood pressure (!) 135/121, pulse 80, temperature 97.9 F (36.6 C), temperature source Oral, resp. rate 13, height 6' (1.829 m), weight 124.5 kg, SpO2 100%.  PHYSICAL EXAMINATION:  Physical Exam  GENERAL:  72 y.o.-year-old African-American male patient lying in the bed with no acute distress.  EYES: Pupils equal, round, reactive to light and accommodation. No scleral icterus. Extraocular muscles intact.  HEENT: Head atraumatic, normocephalic. Oropharynx and nasopharynx clear.  NECK:  Supple, no jugular venous distention. No thyroid enlargement, no tenderness.  LUNGS: Diminished bibasal breath sounds. No use of accessory muscles of respiration.  CARDIOVASCULAR: Regular rate and rhythm, S1, S2 normal. No murmurs, rubs, or gallops.  ABDOMEN: Soft, nondistended, nontender. Bowel sounds present. No organomegaly or mass.  EXTREMITIES: No pedal edema, cyanosis, or clubbing.  NEUROLOGIC: Cranial nerves II through XII are intact. Muscle strength 5/5 in all extremities. Sensation was diminished in the left side of the face, left upper and lower extremity.. Gait not checked.  PSYCHIATRIC: The patient is alert and oriented x 3.  Normal affect and good eye contact. SKIN: No obvious rash, lesion, or ulcer.   LABORATORY PANEL:   CBC Recent Labs  Lab 01/22/23 0935  WBC 9.5  9.4  HGB 14.7  14.8  HCT 45.4  45.3  PLT 157  162   ------------------------------------------------------------------------------------------------------------------  Chemistries  Recent Labs  Lab 01/22/23 0935  NA 135  133*  K 4.2  4.3  CL 102  102  CO2 24  21*  GLUCOSE 273*  287*  BUN 22  20  CREATININE 2.12*  1.93*  CALCIUM 9.4  9.3  AST 20  ALT 15  ALKPHOS 120  BILITOT 0.8    ------------------------------------------------------------------------------------------------------------------  Cardiac Enzymes No results for input(s): "TROPONINI" in the last 168 hours. ------------------------------------------------------------------------------------------------------------------  RADIOLOGY:  CT Angio Chest PE W/Cm &/Or Wo Cm  Result Date: 01/22/2023 CLINICAL DATA:  Altered mental status, slurred speech, chest pain. EXAM: CT ANGIOGRAPHY CHEST WITH CONTRAST TECHNIQUE: Multidetector CT imaging of the chest was performed using the standard protocol during bolus administration of intravenous contrast. Multiplanar CT image reconstructions and MIPs were obtained to evaluate the vascular anatomy. RADIATION DOSE REDUCTION: This exam was performed according  to the departmental dose-optimization program which includes automated exposure control, adjustment of the mA and/or kV according to patient size and/or use of iterative reconstruction technique. CONTRAST:  75mL OMNIPAQUE IOHEXOL 350 MG/ML SOLN COMPARISON:  Chest CT dated 06/01/2022. FINDINGS: Cardiovascular: Preferential opacification of the thoracic aorta limits the sensitivity of the exam for evaluation of pulmonary embolism. Given this limitation, no large pulmonary embolism is identified. Vascular calcifications are seen in the coronary arteries and aortic arch. The heart is mildly enlarged. No pericardial effusion. Mediastinum/Nodes: No enlarged mediastinal, hilar, or axillary lymph nodes. A 1.7 cm hypoattenuating nodule in the left thyroid appears similar to 06/01/2022. The trachea and esophagus demonstrate no significant findings. Lungs/Pleura: There are mild scattered bilateral ground-glass opacities and mild bilateral dependent atelectasis. A large bulla in the right upper lobe with nearby atelectasis/scarring of the right upper and right middle lobes appear similar to prior exam. The previously described ground-glass  nodules in the left upper lobe have since resolved, consistent with a infectious/inflammatory process. No pleural effusion or pneumothorax. Upper Abdomen: No acute abnormality. Musculoskeletal: Degenerative changes are seen in the spine. Review of the MIP images confirms the above findings. IMPRESSION: 1. Limited sensitivity of the exam for pulmonary embolism due to bolus timing. Given this limitation, no large pulmonary embolism. 2. Mild scattered bilateral ground-glass opacities may reflect atelectasis or mild pulmonary edema. Aortic Atherosclerosis (ICD10-I70.0). Electronically Signed   By: Romona Curls M.D.   On: 01/22/2023 13:02   CT ANGIO HEAD NECK W WO CM (CODE STROKE)  Result Date: 01/22/2023 CLINICAL DATA:  Neuro deficit, acute, stroke suspected EXAM: CT ANGIOGRAPHY HEAD AND NECK WITH AND WITHOUT CONTRAST TECHNIQUE: Multidetector CT imaging of the head and neck was performed using the standard protocol during bolus administration of intravenous contrast. Multiplanar CT image reconstructions and MIPs were obtained to evaluate the vascular anatomy. Carotid stenosis measurements (when applicable) are obtained utilizing NASCET criteria, using the distal internal carotid diameter as the denominator. RADIATION DOSE REDUCTION: This exam was performed according to the departmental dose-optimization program which includes automated exposure control, adjustment of the mA and/or kV according to patient size and/or use of iterative reconstruction technique. CONTRAST:  75mL OMNIPAQUE IOHEXOL 350 MG/ML SOLN COMPARISON:  None Available. FINDINGS: CTA NECK FINDINGS Aortic arch: Great vessel origins are patent without significant stenosis. Right carotid system: No evidence of dissection, stenosis (50% or greater), or occlusion. Left carotid system: No evidence of dissection, stenosis (50% or greater), or occlusion. Vertebral arteries: Left dominant. No evidence of dissection, stenosis (50% or greater), or occlusion.  Skeleton: No acute abnormality on limited assessment. Bridging osteophytes. Other neck: Subcentimeter thyroid nodules not require further imaging follow-up (ref: J Am Coll Radiol. 2015 Feb;12(2): 143-50). Upper chest: Evaluated on same day CTA chest. Review of the MIP images confirms the above findings CTA HEAD FINDINGS Anterior circulation: Bilateral intracranial ICAs, MCAs, and ACAs are patent without proximal hemodynamically significant stenosis. No aneurysm. Posterior circulation: Bilateral intradural vertebral arteries, basilar artery and bilateral posterior cerebral arteries are patent without proximal hemodynamically significant stenosis. Venous sinuses: As permitted by contrast timing, patent. Review of the MIP images confirms the above findings IMPRESSION: No emergent large vessel occlusion or proximal hemodynamically significant stenosis. Electronically Signed   By: Feliberto Harts M.D.   On: 01/22/2023 12:07   CT HEAD WO CONTRAST ( )  Result Date: 01/22/2023 CLINICAL DATA:  Altered mental status, nontraumatic (Ped 0-17y) EXAM: CT HEAD WITHOUT CONTRAST TECHNIQUE: Contiguous axial images were obtained from the base of the  skull through the vertex without intravenous contrast. RADIATION DOSE REDUCTION: This exam was performed according to the departmental dose-optimization program which includes automated exposure control, adjustment of the mA and/or kV according to patient size and/or use of iterative reconstruction technique. COMPARISON:  None Available. FINDINGS: Brain: No evidence of acute infarction, hemorrhage, hydrocephalus, extra-axial collection or mass lesion/mass effect. Sequela of mild chronic microvascular ischemic change. Vascular: No hyperdense vessel or unexpected calcification. Skull: Normal. Negative for fracture or focal lesion. Sinuses/Orbits: No middle ear or mastoid effusion. Paranasal sinuses are notable for polypoid mucosal thickening in the right maxillary sinus. Bilateral  lens replacement. Orbits are otherwise unremarkable. Other: None. IMPRESSION: No acute intracranial abnormality. Electronically Signed   By: Lorenza Cambridge M.D.   On: 01/22/2023 10:27   DG Chest 2 View  Result Date: 01/22/2023 CLINICAL DATA:  Chest pain EXAM: CHEST - 2 VIEW COMPARISON:  06/01/2022 FINDINGS: Eventration of the diaphragm. There is some linear opacity along the lung bases likely scar or atelectasis. No consolidation, pneumothorax or edema. Normal cardiopericardial silhouette. Calcified aorta. Degenerative changes of the spine with bridging syndesmophytes diffusely. Films are under penetrated. IMPRESSION: Under penetrated radiographs. Basilar areas of scarring and atelectatic change. Electronically Signed   By: Karen Kays M.D.   On: 01/22/2023 10:22      IMPRESSION AND PLAN:  Assessment and Plan: * Acute CVA (cerebrovascular accident) [facility]) - The patient will be admitted to an observation medically monitored bed.   - We will follow neuro checks q.4 hours for 24 hours.   - The patient will be placed on aspirin.   - Will obtain a brain MRI without contrast as well as head and neck CTA and 2D echo with bubble study .   - A neurology consultation  as well as physical/occupation/speech therapy consults will be obtained in a.m.Marland Kitchen   - The patient will be placed on statin therapy and fasting lipids will be checked.   Chest pain -  Will follow serial troponins and EKGs. - The patient will be resumed on aspirin as well as p.r.n. sublingual nitroglycerin and morphine sulfate for pain. - We will obtain a cardiology consult in a.m. for further cardiac risk stratification. - I notified Dr. Okey Dupre about the patient.   Acute on chronic systolic CHF (congestive heart failure) (HCC) - I will place him on IV Lasix given interstitial pulmonary edema on chest CTA. - We will continue Entresto, Coreg and Aldactone. - Cardiology consult will be obtained as mentioned above. - The patient had a 2D echo  in March of this year revealing an EF of 20-25%.  Dyslipidemia - We will continue statin therapy.  Type 2 diabetes mellitus with peripheral neuropathy (HCC) - The patient will be placed on supplemental coverage with NovoLog.  Essential hypertension - We will continue her antihypertensives with permissive parameters.   DVT prophylaxis: Lovenox.. Advanced Care Planning:  Code Status: full code. Family Communication:  The plan of care was discussed in details with the patient (and family). I answered all questions. The patient agreed to proceed with the above mentioned plan. Further management will depend upon hospital course. Disposition Plan: Back to previous home environment Consults called: Neurology. All the records are reviewed and case discussed with ED provider.  Status is: Observation  I certify that at the time of admission, it is my clinical judgment that the patient will require  hospital care extending less than 2 midnights.  Dispo: The patient is from: Home              Anticipated d/c is to: Home              Patient currently is not medically stable to d/c.              Difficult to place patient: No  Hannah Beat M.D on 01/22/2023 at 2:35 PM  [facility]   From 7 PM-7 AM, contact night-coverage [email or web address]  CC: Primary care physician; [facility]

## 2023-01-22 NOTE — Assessment & Plan Note (Signed)
-   The patient will be placed on supplemental coverage with NovoLog. 

## 2023-01-22 NOTE — Assessment & Plan Note (Signed)
-    Will follow serial troponins and EKGs. - The patient will be resumed on aspirin as well as p.r.n. sublingual nitroglycerin and morphine sulfate for pain. - We will obtain a cardiology consult in a.m. for further cardiac risk stratification. - I notified Dr. Okey Dupre about the patient.

## 2023-01-22 NOTE — Assessment & Plan Note (Addendum)
-   I will place him on IV Lasix given interstitial pulmonary edema on chest CTA. - We will continue Entresto, Coreg and Aldactone. - Cardiology consult will be obtained as mentioned above. - The patient had a 2D echo in March of this year revealing an EF of 20-25%.

## 2023-01-22 NOTE — Assessment & Plan Note (Signed)
-   The patient will be admitted to an observation medically monitored bed.   - We will follow neuro checks q.4 hours for 24 hours.   - The patient will be placed on aspirin.   - Will obtain a brain MRI without contrast as well as head and neck CTA and 2D echo with bubble study .   - A neurology consultation  as well as physical/occupation/speech therapy consults will be obtained in a.m.Marland Kitchen   - The patient will be placed on statin therapy and fasting lipids will be checked.

## 2023-01-22 NOTE — Assessment & Plan Note (Signed)
-   We will continue her antihypertensives with permissive parameters. 

## 2023-01-22 NOTE — Consult Note (Signed)
CODE STROKE- PHARMACY COMMUNICATION   Time CODE STROKE called/page received: 1036  Time response to CODE STROKE was made (in person or via phone): 1036  Time Stroke Kit retrieved from [employer] (only if needed): 1038, not needed  Name of Provider/Nurse contacted: Caryl Pina, MD  Past Medical History:  Diagnosis Date   Anxiety    Arthritis    CHF (congestive heart failure) (HCC)    Depression    Diabetes mellitus without complication (HCC)    GERD (gastroesophageal reflux disease)    Glaucoma    both eyes   Hypertension    Sleep apnea    uses Cpap nightly   Prior to Admission medications   Medication Sig Start Date End Date Taking? Authorizing Provider  AMLODIPINE BESYLATE PO Take 10 mg by mouth daily. 08/16/22 08/17/23  [provider]  aspirin EC 81 MG tablet Take 1 tablet (81 mg total) by mouth 2 (two) times daily. 05/11/19   Tarry Kos, MD  atorvastatin (LIPITOR) 40 MG tablet Take 1 tablet (40 mg total) by mouth at bedtime. 08/01/22   Bensimhon, Bevelyn Buckles, MD  BREZTRI AEROSPHERE 160-9-4.8 MCG/ACT AERO Inhale into the lungs. 04/17/22   [provider]  brimonidine (ALPHAGAN) 0.2 % ophthalmic solution Place 1 drop into both eyes 3 (three) times daily.    [provider]  carvedilol (COREG) 25 MG tablet TAKE 1 TABLET BY MOUTH TWICE A DAY 11/16/22   Bensimhon, Bevelyn Buckles, MD  Cholecalciferol (VITAMIN D) 50 MCG (2000 UT) tablet Take 4,000 Units by mouth daily.  03/03/19   [provider]  docusate sodium (COLACE) 100 MG capsule Take 100 mg by mouth daily as needed. 07/03/22   [provider]  DULoxetine (CYMBALTA) 60 MG capsule Take 60 mg by mouth daily. 01/07/19   [provider]  KLOR-CON M20 20 MEQ tablet TAKE 2 TABLETS BY MOUTH EVERY MORNING AND 1 TABLET IN THE EVENING 11/16/22   Bensimhon, Bevelyn Buckles, MD  latanoprost (XALATAN) 0.005 % ophthalmic solution Place 1 drop into both eyes at bedtime.    [provider]  mexiletine  (MEXITIL) 200 MG capsule TAKE 1 CAPSULE BY MOUTH TWICE A DAY 10/10/22   Bensimhon, Bevelyn Buckles, MD  Multiple Vitamins-Minerals (MULTIVITAMIN WITH MINERALS) tablet Take 1 tablet by mouth daily.    [provider]  Omega-3 1000 MG CAPS Take 2,000 mg by mouth 3 (three) times daily after meals.    [provider]  omeprazole (PRILOSEC) 20 MG capsule Take 20 mg by mouth 2 (two) times daily.    [provider]  OXcarbazepine (TRILEPTAL) 150 MG tablet Take 150 mg by mouth 2 (two) times daily.    [provider]  sacubitril-valsartan (ENTRESTO) 97-103 MG Take 1 tablet by mouth 2 (two) times daily. 08/14/22   Bensimhon, Bevelyn Buckles, MD  Semaglutide,0.25 or 0.5MG /DOS, 2 MG/3ML SOPN Inject 0.5 mg into the skin once a week. 01/11/23   Bensimhon, Bevelyn Buckles, MD  spironolactone (ALDACTONE) 25 MG tablet TAKE 1 TABLET (25 MG TOTAL) BY MOUTH DAILY. 11/16/22   Bensimhon, Bevelyn Buckles, MD  timolol (TIMOPTIC-XR) 0.5 % ophthalmic gel-forming Place 1 drop into both eyes daily.    [provider]  torsemide (DEMADEX) 20 MG tablet TAKE 2 TABLETS (40 MG TOTAL) BY MOUTH DAILY. 01/07/23   Bensimhon, Bevelyn Buckles, MD   Celene Squibb, PharmD Clinical Pharmacist 01/22/2023 11:35 AM

## 2023-01-22 NOTE — Progress Notes (Signed)
PT Cancellation Note  Patient Details Name: Erik Black MRN: [id number] DOB: [birth year]   Cancelled Treatment:    Reason Eval/Treat Not Completed: Other (comment): Per nursing pt preparing for transport out of the ED. Will attempt to see pt at a future date/time as medically appropriate.     Ovidio Hanger PT, DPT 01/22/23, 4:06 PM

## 2023-01-22 NOTE — ED Notes (Signed)
Per Dr. Gaspar Cola, no tnk. CTA and MRI to be ordered. 2hour neuro checks.

## 2023-01-22 NOTE — Consult Note (Signed)
Cardiology Consultation   Patient ID: Erik Black MRN: [id number]; DOB: 25-Jul-1950  Admit date: 01/22/2023 Date of Consult: 01/22/2023  PCP:  [facility] Providers Cardiologist:  Debbe Odea, MD  Electrophysiologist:  Lanier Prude, MD  {   Patient Profile:   Erik Black is a 72 y.o. male with a hx of morbid obesity, HTN, OSA on CPAP, DM2, HFrEF EF 30-35%, NICM, PVCs, DM2, former smoker who is being seen 01/22/2023 for the evaluation of chest pain at the request of Dr. Arville Care.  History of Present Illness:   Erik Black is followed by the heart failure clinic. He was diagnosed with heart failure in 03/2022. Echo 03/2022 showed LVEF 30-35% and was started on lasix and referred to [facility]. He was admitted in November 05/2022 with advanced heart failure treated with IV lasix, diuresed 22lbs. GDMT was titrated. Cath during this admission showed LAS 60%, LCX 55%, mRCA 30%. Right heart cath showed RA 18, RV 68/15, PA 69/39, PCWP 40. cMRI showed LVEF 25% mid wall LGE strip, moderately reduced RV. Echo in 08/2022 showed LVEF 20-25%. He was seen on 12/05/22 at the heart failure clinic and referred to EP for ICD consideration. He was scheduled for 01/28/23 and ASA was held.   The patient presented to [place] 01/22/23 with slurred speech and weakness. He woke up early in his normal state of health. Around 8:30 AM he started noticing slurred speech. He stood up and felt dizzy, lightheaded, generalized weakness and body numbness. He felt like he was going to fall. He felt like he could not move his body and was off balance. He had some chest pain, similar to volume overload int he past. His wife brought him to the ER.   In the ER, the patients symptoms were improving, but not back to baseline. BP 100/77, pulse 44bpm, Resp 18, afebrile, 98%O2. Labs showed sodium 133, CO2 21, BG 287, Scr 1.93, BUN 20. EKG showed NSR with TWI inf/lat leads. UDS negative. HS trop  11>8. Head CT was non-acute. CTA chest showed no PE, pulmonary edema vs atelectasis.CTA head/neck with no occlusion. The patient was admitted for possible TIA vs stroke.    Past Medical History:  Diagnosis Date   Anxiety    Arthritis    CHF (congestive heart failure) (HCC)    Depression    Diabetes mellitus without complication (HCC)    GERD (gastroesophageal reflux disease)    Glaucoma    both eyes   Hypertension    Sleep apnea    uses Cpap nightly    Past Surgical History:  Procedure Laterality Date   CARPAL TUNNEL RELEASE Bilateral    COLONOSCOPY     EYE SURGERY Bilateral    cataract surgery with lens implants   KNEE ARTHROPLASTY Right    KNEE ARTHROPLASTY Left    KNEE ARTHROSCOPY Right    KNEE ARTHROSCOPY Left    MULTIPLE TOOTH EXTRACTIONS     RIGHT/LEFT HEART CATH AND CORONARY ANGIOGRAPHY Bilateral 05/03/2022   Procedure: RIGHT/LEFT HEART CATH AND CORONARY ANGIOGRAPHY;  Surgeon: Marykay Lex, MD;  Location: [facility] INVASIVE CV LAB;  Service: Cardiovascular;  Laterality: Bilateral;   ROTATOR CUFF REPAIR Right    ROTATOR CUFF REPAIR W/ DISTAL CLAVICLE EXCISION Left    TOTAL HIP ARTHROPLASTY Right 05/11/2019   Procedure: RIGHT TOTAL HIP ARTHROPLASTY ANTERIOR APPROACH;  Surgeon: Tarry Kos, MD;  Location: [facility] OR;  Service: Orthopedics;  Laterality: Right;   TOTAL HIP  ARTHROPLASTY Left 11/23/2019   TOTAL HIP ARTHROPLASTY Left 11/23/2019   Procedure: LEFT TOTAL HIP ARTHROPLASTY ANTERIOR APPROACH;  Surgeon: Tarry Kos, MD;  Location: [facility] OR;  Service: Orthopedics;  Laterality: Left;   VASECTOMY       Home Medications:  Prior to Admission medications   Medication Sig Start Date End Date Taking? Authorizing Provider  amLODipine (NORVASC) 10 MG tablet Take 10 mg by mouth daily. 08/16/22 08/17/23 Yes [provider]  aspirin EC 81 MG tablet Take 1 tablet (81 mg total) by mouth 2 (two) times daily. 05/11/19  Yes Tarry Kos, MD  atorvastatin (LIPITOR) 40 MG tablet  Take 1 tablet (40 mg total) by mouth at bedtime. 08/01/22  Yes Bensimhon, Bevelyn Buckles, MD  BREZTRI AEROSPHERE 160-9-4.8 MCG/ACT AERO Inhale 1 puff into the lungs 2 (two) times daily. 04/17/22  Yes [provider]  Brinzolamide-Brimonidine 1-0.2 % SUSP Apply 1 drop to eye 3 (three) times daily. 01/04/23  Yes [provider]  carvedilol (COREG) 25 MG tablet TAKE 1 TABLET BY MOUTH TWICE A DAY 11/16/22  Yes Bensimhon, Bevelyn Buckles, MD  Cholecalciferol (VITAMIN D) 50 MCG (2000 UT) tablet Take 4,000 Units by mouth daily.  03/03/19  Yes [provider]  docusate sodium (COLACE) 100 MG capsule Take 100 mg by mouth daily as needed. 07/03/22  Yes [provider]  DULoxetine (CYMBALTA) 60 MG capsule Take 60 mg by mouth daily. 01/07/19  Yes [provider]  KLOR-CON M20 20 MEQ tablet TAKE 2 TABLETS BY MOUTH EVERY MORNING AND 1 TABLET IN THE EVENING Patient taking differently: Take 40 mEq by mouth 2 (two) times daily. 11/16/22  Yes Bensimhon, Bevelyn Buckles, MD  latanoprost (XALATAN) 0.005 % ophthalmic solution Place 1 drop into both eyes at bedtime.   Yes [provider]  mexiletine (MEXITIL) 200 MG capsule TAKE 1 CAPSULE BY MOUTH TWICE A DAY 10/10/22  Yes Bensimhon, Bevelyn Buckles, MD  Multiple Vitamins-Minerals (MULTIVITAMIN WITH MINERALS) tablet Take 1 tablet by mouth daily.   Yes [provider]  Omega-3 1000 MG CAPS Take 2,000 mg by mouth 3 (three) times daily after meals.   Yes [provider]  omeprazole (PRILOSEC) 20 MG capsule Take 20 mg by mouth 2 (two) times daily.   Yes [provider]  OXcarbazepine (TRILEPTAL) 150 MG tablet Take 150 mg by mouth 2 (two) times daily.   Yes [provider]  sacubitril-valsartan (ENTRESTO) 97-103 MG Take 1 tablet by mouth 2 (two) times daily. 08/14/22  Yes Bensimhon, Bevelyn Buckles, MD  Semaglutide,0.25 or 0.5MG /DOS, 2 MG/3ML SOPN Inject 0.5 mg into the skin once a week. 01/11/23  Yes Bensimhon, Bevelyn Buckles, MD   spironolactone (ALDACTONE) 25 MG tablet TAKE 1 TABLET (25 MG TOTAL) BY MOUTH DAILY. 11/16/22  Yes Bensimhon, Bevelyn Buckles, MD  timolol (TIMOPTIC-XR) 0.5 % ophthalmic gel-forming Place 1 drop into both eyes daily.   Yes [provider]  torsemide (DEMADEX) 20 MG tablet TAKE 2 TABLETS (40 MG TOTAL) BY MOUTH DAILY. 01/07/23  Yes Bensimhon, Bevelyn Buckles, MD    Inpatient Medications: Scheduled Meds:  [START ON 01/23/2023]  stroke: early stages of recovery book   Does not apply Once   amLODipine  10 mg Oral Daily   aspirin EC  81 mg Oral Daily   atorvastatin  40 mg Oral QHS   brinzolamide  1 drop Both Eyes TID   And   brimonidine  1 drop Both Eyes TID   carvedilol  25  mg Oral BID   DULoxetine  60 mg Oral Daily   enoxaparin (LOVENOX) injection  40 mg Subcutaneous Q24H   furosemide  40 mg Intravenous Q12H   latanoprost  1 drop Both Eyes QHS   mexiletine  200 mg Oral BID   multivitamin with minerals  1 tablet Oral Daily   OXcarbazepine  150 mg Oral BID   pantoprazole  40 mg Oral Daily   potassium chloride SA  40 mEq Oral BID   sacubitril-valsartan  1 tablet Oral BID   spironolactone  25 mg Oral Daily   timolol  1 drop Both Eyes Daily   Continuous Infusions:  sodium chloride 100 mL/hr at 01/22/23 1453   PRN Meds: acetaminophen **OR** acetaminophen, docusate sodium, magnesium hydroxide, ondansetron **OR** ondansetron (ZOFRAN) IV, traZODone  Allergies:    Allergies  Allergen Reactions   Empagliflozin Other (See Comments)    Yeast infection  Other Reaction(s): Bleeding skin   Methadone    Oxycodone Itching    Hands started peeling and "could not swallow"   Lisinopril Cough    Social History:   Social History   Socioeconomic History   Marital status: Married    Spouse name: [name]   Number of children: 0   Years of education: Not on file   Highest education level: Not on file  Occupational History   Not on file  Tobacco Use   Smoking status: Former    Current packs/day:  0.00    Types: Cigarettes    Quit date: 05/26/2019    Years since quitting: 3.6   Smokeless tobacco: Never   Tobacco comments:    smokes a pack a month  Vaping Use   Vaping status: Never Used  Substance and Sexual Activity   Alcohol use: Not Currently   Drug use: Not Currently   Sexual activity: Not on file  Other Topics Concern   Not on file  Social History Narrative   Lives at home with wife [name] and a dog.   Social Determinants of Health   Financial Resource Strain: Not on file  Food Insecurity: No Food Insecurity (06/01/2022)   Hunger Vital Sign    Worried About Running Out of Food in the Last Year: Never true    Ran Out of Food in the Last Year: Never true  Transportation Needs: No Transportation Needs (06/01/2022)   PRAPARE - Administrator, Civil Service (Medical): No    Lack of Transportation (Non-Medical): No  Physical Activity: Not on file  Stress: Not on file  Social Connections: Unknown (11/14/2021)   Received from [facility]   Social Network    Social Network: Not on file  Intimate Partner Violence: Not At Risk (06/01/2022)   Humiliation, Afraid, Rape, and Kick questionnaire    Fear of Current or Ex-Partner: No    Emotionally Abused: No    Physically Abused: No    Sexually Abused: No    Family History:    Family History  Problem Relation Age of Onset   Hypertension Mother    Diabetes Mother    Heart disease Mother    Heart disease Father    Heart disease Brother      ROS:  Please see the history of present illness.   All other ROS reviewed and negative.     Physical Exam/Data:   Vitals:   01/22/23 1300 01/22/23 1345 01/22/23 1430 01/22/23 1434  BP: 133/81 130/78 (!) 135/121 (!) 135/121  Pulse: 68 72 80  Resp: 13 19 13    Temp:      TempSrc:      SpO2: 100% 98% 100%   Weight:      Height:       No intake or output data in the 24 hours ending 01/22/23 1513    01/22/2023    9:29 AM 01/10/2023    3:20 PM 12/05/2022    9:56 AM   Last 3 Weights  Weight (lbs) 274 lb 7.6 oz 274 lb 6.4 oz 277 lb  Weight (kg) 124.5 kg 124.467 kg 125.646 kg     Body mass index is 37.23 kg/m.  General:  Well nourished, well developed, in no acute distress HEENT: normal Neck: no JVD Vascular: No carotid bruits; Distal pulses 2+ bilaterally Cardiac:  normal S1, S2; RRR; no murmur  Lungs:  clear to auscultation bilaterally, no wheezing, rhonchi or rales  Abd: soft, nontender, no hepatomegaly  Ext: no edema Musculoskeletal:  No deformities, BUE and BLE strength normal and equal Skin: warm and dry  Neuro:  CNs 2-12 intact, no focal abnormalities noted Psych:  Normal affect   EKG:  The EKG was personally reviewed and demonstrates:  NSR 82bpm, min ST depression/TWI inf/lat leads Telemetry:  Telemetry was personally reviewed and demonstrates:  NSR 70s, PVCs  Relevant CV Studies:  Heart monitor 12/2022 Patch Wear Time:  13 days and 19 hours (2024-06-05T10:59:39-0400 to 2024-06-19T06:33:55-0400)   1. Sinus rhythm -  avg HR of 80 bpm.  2. Bundle Branch Block/IVCD was present.  3. Two runs of nonsustained Ventricular Tachycardia occurred, the run with the fastest interval lasting 4 beats with a max rate of 171 bpm, the longest lasting 4 beats with an avg rate of 148 bpm.  4. Seven runs of Supraventricular Tachycardia occurred, the run with the fastest interval lasting 7 beats with a max rate of 176 bpm, the longest lasting 12.7 secs with an avg rate of 120 bpm.  5. Occasional PVCs (2.1%, 32785) 6. Ventricular Bigeminy and Trigeminy were present.   Arvilla Meres, MD  1:53 PM    Echo 08/2022  1. Left ventricular ejection fraction, by estimation, is 20 to 25%. The  left ventricle has severely decreased function. The left ventricle  demonstrates global hypokinesis. The left ventricular internal cavity size  was mildly dilated. There is mild left  ventricular hypertrophy. Left ventricular diastolic function could not be  evaluated.    2. Right ventricular systolic function is normal. The right ventricular  size is normal. Moderately increased right ventricular wall thickness.   3. The mitral valve is normal in structure. Unable to accurately assess  mitral valve regurgitation.   4. The aortic valve was not well visualized. Aortic valve regurgitation  not well assessed.   Echo 06/2022 1. Left ventricular ejection fraction, by estimation, is 30 to 35%. The  left ventricle has moderately decreased function. The left ventricle  demonstrates global hypokinesis. The left ventricular internal cavity size  was mildly dilated. There is mild  left ventricular hypertrophy. Left ventricular diastolic parameters are  consistent with Grade II diastolic dysfunction (pseudonormalization).   2. Right ventricular systolic function is normal. The right ventricular  size is normal. Tricuspid regurgitation signal is inadequate for assessing  PA pressure.   3. Left atrial size was moderately dilated.   4. Right atrial size was mildly dilated.   5. The mitral valve is normal in structure. No evidence of mitral valve  regurgitation. No evidence of mitral stenosis.   6. The  aortic valve is normal in structure. Aortic valve regurgitation is  not visualized. Aortic valve sclerosis is present, with no evidence of  aortic valve stenosis.   Cardiac cath 05/2022   Mid RCA to Dist RCA lesion is 30% stenosed.   Ost Cx to Prox Cx lesion is 55% stenosed.   Dist LAD lesion is 60% stenosed.   LV end diastolic pressure is severely elevated.   Hemodynamic findings consistent with moderate pulmonary hypertension.   There is no aortic valve stenosis.   Nonischemic Cardiomyopathy: Angiographically Moderate 3-vessel disease Acute on Chronic severe Combined Systolic and Diastolic Heart Failure-known EF of 30 to 35% /Cardiac Index of 2.25.  PCWP 38 mmHg, PA mean 41 mmHg, and LVEDP of 40 mmHg. Right Heart Cath Numbers: RAP mean 18 mmHg, RV-EDP 68/11-24  mmHg; PAP 69/39 mmHg-mean 41 mmHg; PCWP 38 to 41 mmHg. Ao sat 95%, PA sat 66%. Cardiac Output 5.59--Index 2.25        PLAN With severe acute on chronic combined systolic and diastolic failure, discussed with the patient and Dr. Azucena Cecil, the plan will be to admit to the hospital service for management of heart failure.  He has been given 40 mg of Lasix in the Cath Lab and will give another 80 g tonight.  Need to closely follow his potassium levels.   Per Dr. Azucena Cecil, plan for medication management as follows: Acute combined CHF:  Converted from Lopressor to carvedilol 12.5 mg twice daily Continue Entresto 49 and 51 mg daily and titrate up as tolerated We will need to determine appropriate dose of outpatient Lasix, but for now we will do 80 mg IV twice daily-depending on urine output may or may not need to consider inotropic support  If pressure tolerates, would add spironolactone Patient has had a a severe reaction to Jardiance in the past with yeast infection and skin peeling.  Therefore no SGLT2 inhibitor, but consider GLP-1 agonist.  Top Lopressor, stop Norvasc.  Start Coreg 12.5 mg twice daily, continue Entresto 49-51 mg twice daily, continue Lasix 20 mg daily.  Schedule right and left heart cath to evaluate ischemic cause.  Titrate GDMT as BP permits.  Consider adding Aldactone at follow-up visit.  Patient did not tolerate Jardiance in the past, developed yeast infection.   Hyperlipidemia, continue Lipitor 40 mg daily.  Obtain fasting lipid profile. CAD/coronary calcifications on chest CT. patient has no chest pain.  Angiographically normal coronary arteries.  Continue aspirin & Lipitor.     Bryan Lemma, MD    Laboratory Data:  High Sensitivity Troponin:   Recent Labs  Lab 01/22/23 0935 01/22/23 1233  TROPONINIHS 11 8     Chemistry Recent Labs  Lab 01/22/23 0935  NA 135  133*  K 4.2  4.3  CL 102  102  CO2 24  21*  GLUCOSE 273*  287*  BUN 22  20   CREATININE 2.12*  1.93*  CALCIUM 9.4  9.3  GFRNONAA 33*  37*  ANIONGAP 9  10    Recent Labs  Lab 01/22/23 0935  PROT 7.2  ALBUMIN 4.1  AST 20  ALT 15  ALKPHOS 120  BILITOT 0.8   Lipids No results for input(s): "CHOL", "TRIG", "HDL", "LABVLDL", "LDLCALC", "CHOLHDL" in the last 168 hours.  Hematology Recent Labs  Lab 01/22/23 0935  WBC 9.5  9.4  RBC 5.10  5.07  HGB 14.7  14.8  HCT 45.4  45.3  MCV 89.0  89.3  MCH 28.8  29.2  MCHC 32.4  32.7  RDW 12.4  12.5  PLT 157  162   Thyroid No results for input(s): "TSH", "FREET4" in the last 168 hours.  BNPNo results for input(s): "BNP", "PROBNP" in the last 168 hours.  DDimer No results for input(s): "DDIMER" in the last 168 hours.   Radiology/Studies:  CT Angio Chest PE W/Cm &/Or Wo Cm  Result Date: 01/22/2023 CLINICAL DATA:  Altered mental status, slurred speech, chest pain. EXAM: CT ANGIOGRAPHY CHEST WITH CONTRAST TECHNIQUE: Multidetector CT imaging of the chest was performed using the standard protocol during bolus administration of intravenous contrast. Multiplanar CT image reconstructions and MIPs were obtained to evaluate the vascular anatomy. RADIATION DOSE REDUCTION: This exam was performed according to the departmental dose-optimization program which includes automated exposure control, adjustment of the mA and/or kV according to patient size and/or use of iterative reconstruction technique. CONTRAST:  75mL OMNIPAQUE IOHEXOL 350 MG/ML SOLN COMPARISON:  Chest CT dated 06/01/2022. FINDINGS: Cardiovascular: Preferential opacification of the thoracic aorta limits the sensitivity of the exam for evaluation of pulmonary embolism. Given this limitation, no large pulmonary embolism is identified. Vascular calcifications are seen in the coronary arteries and aortic arch. The heart is mildly enlarged. No pericardial effusion. Mediastinum/Nodes: No enlarged mediastinal, hilar, or axillary lymph nodes. A 1.7 cm hypoattenuating  nodule in the left thyroid appears similar to 06/01/2022. The trachea and esophagus demonstrate no significant findings. Lungs/Pleura: There are mild scattered bilateral ground-glass opacities and mild bilateral dependent atelectasis. A large bulla in the right upper lobe with nearby atelectasis/scarring of the right upper and right middle lobes appear similar to prior exam. The previously described ground-glass nodules in the left upper lobe have since resolved, consistent with a infectious/inflammatory process. No pleural effusion or pneumothorax. Upper Abdomen: No acute abnormality. Musculoskeletal: Degenerative changes are seen in the spine. Review of the MIP images confirms the above findings. IMPRESSION: 1. Limited sensitivity of the exam for pulmonary embolism due to bolus timing. Given this limitation, no large pulmonary embolism. 2. Mild scattered bilateral ground-glass opacities may reflect atelectasis or mild pulmonary edema. Aortic Atherosclerosis (ICD10-I70.0). Electronically Signed   By: Romona Curls M.D.   On: 01/22/2023 13:02   CT ANGIO HEAD NECK W WO CM (CODE STROKE)  Result Date: 01/22/2023 CLINICAL DATA:  Neuro deficit, acute, stroke suspected EXAM: CT ANGIOGRAPHY HEAD AND NECK WITH AND WITHOUT CONTRAST TECHNIQUE: Multidetector CT imaging of the head and neck was performed using the standard protocol during bolus administration of intravenous contrast. Multiplanar CT image reconstructions and MIPs were obtained to evaluate the vascular anatomy. Carotid stenosis measurements (when applicable) are obtained utilizing NASCET criteria, using the distal internal carotid diameter as the denominator. RADIATION DOSE REDUCTION: This exam was performed according to the departmental dose-optimization program which includes automated exposure control, adjustment of the mA and/or kV according to patient size and/or use of iterative reconstruction technique. CONTRAST:  75mL OMNIPAQUE IOHEXOL 350 MG/ML SOLN  COMPARISON:  None Available. FINDINGS: CTA NECK FINDINGS Aortic arch: Great vessel origins are patent without significant stenosis. Right carotid system: No evidence of dissection, stenosis (50% or greater), or occlusion. Left carotid system: No evidence of dissection, stenosis (50% or greater), or occlusion. Vertebral arteries: Left dominant. No evidence of dissection, stenosis (50% or greater), or occlusion. Skeleton: No acute abnormality on limited assessment. Bridging osteophytes. Other neck: Subcentimeter thyroid nodules not require further imaging follow-up (ref: J Am Coll Radiol. 2015 Feb;12(2): 143-50). Upper chest: Evaluated on same day CTA chest. Review of the MIP images confirms  the above findings CTA HEAD FINDINGS Anterior circulation: Bilateral intracranial ICAs, MCAs, and ACAs are patent without proximal hemodynamically significant stenosis. No aneurysm. Posterior circulation: Bilateral intradural vertebral arteries, basilar artery and bilateral posterior cerebral arteries are patent without proximal hemodynamically significant stenosis. Venous sinuses: As permitted by contrast timing, patent. Review of the MIP images confirms the above findings IMPRESSION: No emergent large vessel occlusion or proximal hemodynamically significant stenosis. Electronically Signed   By: Feliberto Harts M.D.   On: 01/22/2023 12:07   CT HEAD WO CONTRAST ( )  Result Date: 01/22/2023 CLINICAL DATA:  Altered mental status, nontraumatic (Ped 0-17y) EXAM: CT HEAD WITHOUT CONTRAST TECHNIQUE: Contiguous axial images were obtained from the base of the skull through the vertex without intravenous contrast. RADIATION DOSE REDUCTION: This exam was performed according to the departmental dose-optimization program which includes automated exposure control, adjustment of the mA and/or kV according to patient size and/or use of iterative reconstruction technique. COMPARISON:  None Available. FINDINGS: Brain: No evidence of acute  infarction, hemorrhage, hydrocephalus, extra-axial collection or mass lesion/mass effect. Sequela of mild chronic microvascular ischemic change. Vascular: No hyperdense vessel or unexpected calcification. Skull: Normal. Negative for fracture or focal lesion. Sinuses/Orbits: No middle ear or mastoid effusion. Paranasal sinuses are notable for polypoid mucosal thickening in the right maxillary sinus. Bilateral lens replacement. Orbits are otherwise unremarkable. Other: None. IMPRESSION: No acute intracranial abnormality. Electronically Signed   By: Lorenza Cambridge M.D.   On: 01/22/2023 10:27   DG Chest 2 View  Result Date: 01/22/2023 CLINICAL DATA:  Chest pain EXAM: CHEST - 2 VIEW COMPARISON:  06/01/2022 FINDINGS: Eventration of the diaphragm. There is some linear opacity along the lung bases likely scar or atelectasis. No consolidation, pneumothorax or edema. Normal cardiopericardial silhouette. Calcified aorta. Degenerative changes of the spine with bridging syndesmophytes diffusely. Films are under penetrated. IMPRESSION: Under penetrated radiographs. Basilar areas of scarring and atelectatic change. Electronically Signed   By: Karen Kays M.D.   On: 01/22/2023 10:22     Assessment and Plan:   Acute stroke vs TIA - symptoms have now resolved - CT head negative - MRI brain ordered - neurology is following - started on Aspirin - question of arrhythmia. heart monitor 12/2022 showed NSR, IVCD, 2 runs ofNSVT longest 4 beats, 7 runs of SVT, occasional PVCs 2.1%. He is on mexiletine for PVCs - tele shows NSR. May need to post-pone ICD implantation - continue Lipitor - check orthostatics for dizziness - repeat limited echo  Chronic HFrEF NICM - echo 08/2022 EF 20-25% - cath 05/2022 nonobstructive dz - no infiltrative process on cMRI - patient was going to undergo ICD implantation 7/29 and ASA was held - he appears euvolemic on exam - coreg 25mg  BID, entresto 97-103 mg BID, spiro 25mg  daily>may need  to hold for permissive HTN - IV lasix 40mg  BID - PTA torsemide 40mg  daily - appears fairly euvolemic  Nonobstructive CAD - patient reported chest pain similar to when he was volume overloaded - HS trop negative x 2 - EKG with TWI inf/lat leads - started on ASA 81mg  daily - continue Lipitor -cath in 05/2022 with nonobstructive disease - can re-check limited echo, but will not likely change much  PVCs - he is on mexiletine 200mg  BID for suppression  For questions or updates, please contact [facility] Please consult [email or web address] for contact info under    Signed, Ranell Skibinski David Stall, PA-C  01/22/2023 3:13 PM

## 2023-01-22 NOTE — Assessment & Plan Note (Signed)
-   We will continue statin therapy. 

## 2023-01-22 NOTE — ED Notes (Signed)
Pt to CT

## 2023-01-22 NOTE — Progress Notes (Signed)
OT Cancellation Note  Patient Details Name: Amadi Frady MRN: [id number] DOB: 10-Nov-1950   Cancelled Treatment:    Reason Eval/Treat Not Completed: Other (comment). Pt out of room, per chart transferring to floor and also has additional work up pending. Will re-attempt OT evaluation at later date/time as medically appropriate.   Arman Filter., MPH, MS, OTR/L ascom [phone] 01/22/23, 4:07 PM

## 2023-01-22 NOTE — Progress Notes (Signed)
  Chaplain On-Call responded to Code Stroke notification at 1035 hours.  The patient was being examined by the Medical Team, and no family is present.  Chaplain assured ED Staff of availability as needed.  Chaplain Evelena Peat M.Div., [facility]

## 2023-01-22 NOTE — ED Triage Notes (Addendum)
Pt here with AMS, slurred speech, cp that started at 0900. Pt also c/o left side headache pain. Pt also states he cannot get his words out. Pt has a hx of CHF and is scheduled for a ICD placement on 01/28/2023. Pt has stopped his insulin and Asprin today in preparation for the procedure. Pt speech not slurred at this moment and oriented to place, time, and situation.

## 2023-01-22 NOTE — ED Provider Notes (Signed)
[facility] Provider Note   Event Date/Time   First MD Initiated Contact with Patient 01/22/23 1009     (approximate) History  Altered Mental Status and Chest Pain  HPI Erik Black is a 72 y.o. male with a past medical history of CHF, CAD, hypertension, and hyperlipidemia who presents complaining of left-sided facial and upper/lower extremity numbness that began at 9 AM.  Patient states that the symptoms were accompanied with mild dysarthria that has improved at this point.  Patient states that the numbness however is still present.  Patient also complaining of intermittent substernal chest pressure ROS: Patient currently denies any vision changes, tinnitus, difficulty speaking, facial droop, sore throat, shortness of breath, abdominal pain, nausea/vomiting/diarrhea, dysuria, or weakness/numbness/paresthesias in any extremity   Physical Exam  Triage Vital Signs: ED Triage Vitals  Encounter Vitals Group     BP 01/22/23 0930 100/77     Systolic BP Percentile --      Diastolic BP Percentile --      Pulse Rate 01/22/23 0927 (!) 44     Resp 01/22/23 0927 18     Temp 01/22/23 0934 97.9 F (36.6 C)     Temp Source 01/22/23 0934 Oral     SpO2 01/22/23 0927 98 %     Weight 01/22/23 0929 274 lb 7.6 oz (124.5 kg)     Height 01/22/23 0929 6' (1.829 m)     Head Circumference --      Peak Flow --      Pain Score --      Pain Loc --      Pain Education --      Exclude from Growth Chart --    Most recent vital signs: Vitals:   01/22/23 1430 01/22/23 1434  BP: (!) 135/121 (!) 135/121  Pulse: 80   Resp: 13   Temp:    SpO2: 100%    General: Awake, oriented x4. CV:  Good peripheral perfusion.  Resp:  Normal effort.  Abd:  No distention.  Other:  Elderly overweight African-American male laying in bed in no acute distress.  Decree sensation over left face, left lower extremity, and left forearm ED Results / Procedures / Treatments  Labs (all labs ordered  are listed, but only abnormal results are displayed) Labs Reviewed  BASIC METABOLIC PANEL - Abnormal; Notable for the following components:      Result Value   Sodium 133 (*)    CO2 21 (*)    Glucose, Bld 287 (*)    Creatinine, Ser 1.93 (*)    GFR, Estimated 37 (*)    All other components within normal limits  URINALYSIS, ROUTINE W REFLEX MICROSCOPIC - Abnormal; Notable for the following components:   Color, Urine AMBER (*)    APPearance HAZY (*)    Glucose, UA 50 (*)    Protein, ur 100 (*)    Bacteria, UA RARE (*)    All other components within normal limits  COMPREHENSIVE METABOLIC PANEL - Abnormal; Notable for the following components:   Glucose, Bld 273 (*)    Creatinine, Ser 2.12 (*)    GFR, Estimated 33 (*)    All other components within normal limits  CBG MONITORING, ED - Abnormal; Notable for the following components:   Glucose-Capillary 268 (*)    All other components within normal limits  CBC  ETHANOL  PROTIME-INR  APTT  URINE DRUG SCREEN, QUALITATIVE ([facility] ONLY)  CBC WITH DIFFERENTIAL/PLATELET  HEMOGLOBIN A1C  TROPONIN I (  HIGH SENSITIVITY)  TROPONIN I (HIGH SENSITIVITY)   EKG ED ECG REPORT I, Merwyn Katos, the attending physician, personally viewed and interpreted this ECG. Date: 01/22/2023 EKG Time: 0934 Rate: 82 Rhythm: normal sinus rhythm QRS Axis: normal Intervals: normal ST/T Wave abnormalities: normal Narrative Interpretation: no evidence of acute ischemia RADIOLOGY ED MD interpretation: CT angiography of the chest shows mild scattered bilateral groundglass opacities  CT angiography of the head and neck interpreted independently by me and shows no emergent large vessel occlusion or proximal hemodynamically significant stenosis.  CT of the head without contrast interpreted by me shows no evidence of acute abnormalities including no intracerebral hemorrhage, obvious masses, or significant edema -Agree with radiology assessment Official radiology  report(s): CT Angio Chest PE W/Cm &/Or Wo Cm  Result Date: 01/22/2023 CLINICAL DATA:  Altered mental status, slurred speech, chest pain. EXAM: CT ANGIOGRAPHY CHEST WITH CONTRAST TECHNIQUE: Multidetector CT imaging of the chest was performed using the standard protocol during bolus administration of intravenous contrast. Multiplanar CT image reconstructions and MIPs were obtained to evaluate the vascular anatomy. RADIATION DOSE REDUCTION: This exam was performed according to the departmental dose-optimization program which includes automated exposure control, adjustment of the mA and/or kV according to patient size and/or use of iterative reconstruction technique. CONTRAST:  75mL OMNIPAQUE IOHEXOL 350 MG/ML SOLN COMPARISON:  Chest CT dated 06/01/2022. FINDINGS: Cardiovascular: Preferential opacification of the thoracic aorta limits the sensitivity of the exam for evaluation of pulmonary embolism. Given this limitation, no large pulmonary embolism is identified. Vascular calcifications are seen in the coronary arteries and aortic arch. The heart is mildly enlarged. No pericardial effusion. Mediastinum/Nodes: No enlarged mediastinal, hilar, or axillary lymph nodes. A 1.7 cm hypoattenuating nodule in the left thyroid appears similar to 06/01/2022. The trachea and esophagus demonstrate no significant findings. Lungs/Pleura: There are mild scattered bilateral ground-glass opacities and mild bilateral dependent atelectasis. A large bulla in the right upper lobe with nearby atelectasis/scarring of the right upper and right middle lobes appear similar to prior exam. The previously described ground-glass nodules in the left upper lobe have since resolved, consistent with a infectious/inflammatory process. No pleural effusion or pneumothorax. Upper Abdomen: No acute abnormality. Musculoskeletal: Degenerative changes are seen in the spine. Review of the MIP images confirms the above findings. IMPRESSION: 1. Limited  sensitivity of the exam for pulmonary embolism due to bolus timing. Given this limitation, no large pulmonary embolism. 2. Mild scattered bilateral ground-glass opacities may reflect atelectasis or mild pulmonary edema. Aortic Atherosclerosis (ICD10-I70.0). Electronically Signed   By: Romona Curls M.D.   On: 01/22/2023 13:02   CT ANGIO HEAD NECK W WO CM (CODE STROKE)  Result Date: 01/22/2023 CLINICAL DATA:  Neuro deficit, acute, stroke suspected EXAM: CT ANGIOGRAPHY HEAD AND NECK WITH AND WITHOUT CONTRAST TECHNIQUE: Multidetector CT imaging of the head and neck was performed using the standard protocol during bolus administration of intravenous contrast. Multiplanar CT image reconstructions and MIPs were obtained to evaluate the vascular anatomy. Carotid stenosis measurements (when applicable) are obtained utilizing NASCET criteria, using the distal internal carotid diameter as the denominator. RADIATION DOSE REDUCTION: This exam was performed according to the departmental dose-optimization program which includes automated exposure control, adjustment of the mA and/or kV according to patient size and/or use of iterative reconstruction technique. CONTRAST:  75mL OMNIPAQUE IOHEXOL 350 MG/ML SOLN COMPARISON:  None Available. FINDINGS: CTA NECK FINDINGS Aortic arch: Great vessel origins are patent without significant stenosis. Right carotid system: No evidence of dissection, stenosis (50% or  greater), or occlusion. Left carotid system: No evidence of dissection, stenosis (50% or greater), or occlusion. Vertebral arteries: Left dominant. No evidence of dissection, stenosis (50% or greater), or occlusion. Skeleton: No acute abnormality on limited assessment. Bridging osteophytes. Other neck: Subcentimeter thyroid nodules not require further imaging follow-up (ref: J Am Coll Radiol. 2015 Feb;12(2): 143-50). Upper chest: Evaluated on same day CTA chest. Review of the MIP images confirms the above findings CTA HEAD  FINDINGS Anterior circulation: Bilateral intracranial ICAs, MCAs, and ACAs are patent without proximal hemodynamically significant stenosis. No aneurysm. Posterior circulation: Bilateral intradural vertebral arteries, basilar artery and bilateral posterior cerebral arteries are patent without proximal hemodynamically significant stenosis. Venous sinuses: As permitted by contrast timing, patent. Review of the MIP images confirms the above findings IMPRESSION: No emergent large vessel occlusion or proximal hemodynamically significant stenosis. Electronically Signed   By: Feliberto Harts M.D.   On: 01/22/2023 12:07   CT HEAD WO CONTRAST ( )  Result Date: 01/22/2023 CLINICAL DATA:  Altered mental status, nontraumatic (Ped 0-17y) EXAM: CT HEAD WITHOUT CONTRAST TECHNIQUE: Contiguous axial images were obtained from the base of the skull through the vertex without intravenous contrast. RADIATION DOSE REDUCTION: This exam was performed according to the departmental dose-optimization program which includes automated exposure control, adjustment of the mA and/or kV according to patient size and/or use of iterative reconstruction technique. COMPARISON:  None Available. FINDINGS: Brain: No evidence of acute infarction, hemorrhage, hydrocephalus, extra-axial collection or mass lesion/mass effect. Sequela of mild chronic microvascular ischemic change. Vascular: No hyperdense vessel or unexpected calcification. Skull: Normal. Negative for fracture or focal lesion. Sinuses/Orbits: No middle ear or mastoid effusion. Paranasal sinuses are notable for polypoid mucosal thickening in the right maxillary sinus. Bilateral lens replacement. Orbits are otherwise unremarkable. Other: None. IMPRESSION: No acute intracranial abnormality. Electronically Signed   By: Lorenza Cambridge M.D.   On: 01/22/2023 10:27   DG Chest 2 View  Result Date: 01/22/2023 CLINICAL DATA:  Chest pain EXAM: CHEST - 2 VIEW COMPARISON:  06/01/2022 FINDINGS:  Eventration of the diaphragm. There is some linear opacity along the lung bases likely scar or atelectasis. No consolidation, pneumothorax or edema. Normal cardiopericardial silhouette. Calcified aorta. Degenerative changes of the spine with bridging syndesmophytes diffusely. Films are under penetrated. IMPRESSION: Under penetrated radiographs. Basilar areas of scarring and atelectatic change. Electronically Signed   By: Karen Kays M.D.   On: 01/22/2023 10:22   PROCEDURES: Critical Care performed: No .1-3 Lead EKG Interpretation  Performed by: Merwyn Katos, MD Authorized by: Merwyn Katos, MD     Interpretation: normal     ECG rate:  71   ECG rate assessment: normal     Rhythm: sinus rhythm     Ectopy: none     Conduction: normal    MEDICATIONS ORDERED IN ED: Medications  aspirin EC tablet 81 mg (81 mg Oral Given 01/22/23 1436)  amLODipine (NORVASC) tablet 10 mg (10 mg Oral Given 01/22/23 1434)  atorvastatin (LIPITOR) tablet 40 mg (has no administration in time range)  carvedilol (COREG) tablet 25 mg (25 mg Oral Given 01/22/23 1436)  mexiletine (MEXITIL) capsule 200 mg (200 mg Oral Given 01/22/23 1437)  sacubitril-valsartan (ENTRESTO) 97-103 mg per tablet (1 tablet Oral Given 01/22/23 1438)  spironolactone (ALDACTONE) tablet 25 mg (25 mg Oral Given 01/22/23 1436)  DULoxetine (CYMBALTA) DR capsule 60 mg (60 mg Oral Given 01/22/23 1435)  docusate sodium (COLACE) capsule 100 mg (has no administration in time range)  pantoprazole (PROTONIX) EC tablet  40 mg (40 mg Oral Given 01/22/23 1435)  OXcarbazepine (TRILEPTAL) tablet 150 mg (150 mg Oral Given 01/22/23 1436)  potassium chloride SA (KLOR-CON M) CR tablet 40 mEq (40 mEq Oral Given 01/22/23 1436)  multivitamin with minerals tablet 1 tablet (1 tablet Oral Given 01/22/23 1453)  brinzolamide (AZOPT) 1 % ophthalmic suspension 1 drop (has no administration in time range)    And  brimonidine (ALPHAGAN) 0.2 % ophthalmic solution 1 drop (has no  administration in time range)  latanoprost (XALATAN) 0.005 % ophthalmic solution 1 drop (has no administration in time range)  timolol (TIMOPTIC-XR) 0.5 % ophthalmic gel-forming 1 drop (has no administration in time range)   stroke: early stages of recovery book (has no administration in time range)  enoxaparin (LOVENOX) injection 40 mg (40 mg Subcutaneous Given 01/22/23 1437)  0.9 %  sodium chloride infusion ( Intravenous New Bag/Given 01/22/23 1453)  acetaminophen (TYLENOL) tablet 650 mg (has no administration in time range)    Or  acetaminophen (TYLENOL) suppository 650 mg (has no administration in time range)  traZODone (DESYREL) tablet 25 mg (has no administration in time range)  magnesium hydroxide (MILK OF MAGNESIA) suspension 30 mL (has no administration in time range)  ondansetron (ZOFRAN) tablet 4 mg (has no administration in time range)    Or  ondansetron (ZOFRAN) injection 4 mg (has no administration in time range)  furosemide (LASIX) injection 40 mg (40 mg Intravenous Given 01/22/23 1453)  iohexol (OMNIPAQUE) 350 MG/ML injection 75 mL (75 mLs Intravenous Contrast Given 01/22/23 1129)   IMPRESSION / MDM / ASSESSMENT AND PLAN / ED COURSE  I reviewed the triage vital signs and the nursing notes.                             The patient is on the cardiac monitor to evaluate for evidence of arrhythmia and/or significant heart rate changes. Patient's presentation is most consistent with acute presentation with potential threat to life or bodily function. Stroke alert PMH risk factors: CAD, hypertension, hyperlipidemia Neurologic Deficits: Left-sided numbness Last known Well Time: 0900 NIH Stroke Score: 2 Given History and Exam I have lower suspicion for infectious etiology, neurologic changes secondary to toxicologic ingestion, seizure, complex migraine. Presentation concerning for possible stroke requiring workup.  Workup: Labs: POC glucose, CBC, BMP, LFTs, Troponin, PT/INR, PTT,  Type and Screen Other Diagnostics: ECG, CXR, non-contrast head CT followed by CTA brain and neck (to r/o large vessel occlusion amenable to thrombectomy) Interventions: Patient low on NIH scale and out of the window for tpa.  Consult: Neurology. Discussed with Dr. Otelia Limes regarding patients neurological symptoms and last well-known time and eligibility for TPA criteria. Disposition: Admission to medicine   FINAL CLINICAL IMPRESSION(S) / ED DIAGNOSES   Final diagnoses:  Left upper extremity numbness  Numbness of left lower extremity  Chest pain, unspecified type   Rx / DC Orders   ED Discharge Orders     None      Note:  This document was prepared using Dragon voice recognition software and may include unintentional dictation errors.   Merwyn Katos, MD 01/22/23 [phone]

## 2023-01-22 NOTE — Progress Notes (Signed)
1031 Stroke cart activated and elert sent to [facility]. Pt was assessed by EDP Vicente Males, MD) prior to stroke cart activation and NCCT completed. Pt presented to ED with c/o dizziness, L side tingling/numbness, feeling off balance, and slurred speech. LKW 0840, mRS 1 1034 Dr. Otelia Limes with [facility] paged 1045 Dr. Otelia Limes arrives to bedside and assessing pt at this time.

## 2023-01-23 ENCOUNTER — Observation Stay (HOSPITAL_BASED_OUTPATIENT_CLINIC_OR_DEPARTMENT_OTHER)
Admit: 2023-01-23 | Discharge: 2023-01-23 | Disposition: A | Discharge status: Home / Self Care | Payer: Medicare HMO | Attending: Medical | Admitting: Medical

## 2023-01-23 DIAGNOSIS — I493 Ventricular premature depolarization: Secondary | ICD-10-CM

## 2023-01-23 DIAGNOSIS — R29818 Other symptoms and signs involving the nervous system: Secondary | ICD-10-CM | POA: Diagnosis not present

## 2023-01-23 DIAGNOSIS — R079 Chest pain, unspecified: Secondary | ICD-10-CM

## 2023-01-23 DIAGNOSIS — R0789 Other chest pain: Secondary | ICD-10-CM

## 2023-01-23 DIAGNOSIS — E1142 Type 2 diabetes mellitus with diabetic polyneuropathy: Secondary | ICD-10-CM | POA: Diagnosis not present

## 2023-01-23 DIAGNOSIS — I639 Cerebral infarction, unspecified: Secondary | ICD-10-CM | POA: Diagnosis not present

## 2023-01-23 DIAGNOSIS — I5023 Acute on chronic systolic (congestive) heart failure: Secondary | ICD-10-CM | POA: Diagnosis not present

## 2023-01-23 DIAGNOSIS — I10 Essential (primary) hypertension: Secondary | ICD-10-CM | POA: Diagnosis not present

## 2023-01-23 LAB — BASIC METABOLIC PANEL
Anion gap: 6 (ref 5–15)
BUN: 24 mg/dL — ABNORMAL HIGH (ref 8–23)
CO2: 27 mmol/L (ref 22–32)
Calcium: 9.2 mg/dL (ref 8.9–10.3)
Chloride: 103 mmol/L (ref 98–111)
Creatinine, Ser: 1.7 mg/dL — ABNORMAL HIGH (ref 0.61–1.24)
GFR, Estimated: 43 mL/min — ABNORMAL LOW (ref >60)
Glucose, Bld: 197 mg/dL — ABNORMAL HIGH (ref 70–99)
Potassium: 4.4 mmol/L (ref 3.5–5.1)
Sodium: 136 mmol/L (ref 135–145)

## 2023-01-23 LAB — CBC
HCT: 42.4 % (ref 39.0–52.0)
Hemoglobin: 13.9 g/dL (ref 13.0–17.0)
MCH: 29.4 pg (ref 26.0–34.0)
MCHC: 32.8 g/dL (ref 30.0–36.0)
MCV: 89.8 fL (ref 80.0–100.0)
Platelets: 144 10*3/uL — ABNORMAL LOW (ref 150–400)
RBC: 4.72 MIL/uL (ref 4.22–5.81)
RDW: 12.5 % (ref 11.5–15.5)
WBC: 5.8 10*3/uL (ref 4.0–10.5)
nRBC: 0 % (ref 0.0–0.2)

## 2023-01-23 LAB — LIPID PANEL
Cholesterol: 152 mg/dL (ref 0–200)
HDL: 34 mg/dL — ABNORMAL LOW (ref >40)
LDL Cholesterol: 91 mg/dL (ref 0–99)
Total CHOL/HDL Ratio: 4.5 RATIO
Triglycerides: 134 mg/dL (ref <150)
VLDL: 27 mg/dL (ref 0–40)

## 2023-01-23 LAB — GLUCOSE, CAPILLARY
Glucose-Capillary: 195 mg/dL — ABNORMAL HIGH (ref 70–99)
Glucose-Capillary: 195 mg/dL — ABNORMAL HIGH (ref 70–99)

## 2023-01-23 LAB — ECHOCARDIOGRAM LIMITED
Height: 72 in
S' Lateral: 5 cm
Weight: 4391.56 oz

## 2023-01-23 MED ORDER — INSULIN GLARGINE-YFGN 100 UNIT/ML ~~LOC~~ SOLN
18.0000 [IU] | Freq: Every day | SUBCUTANEOUS | Status: DC
  Administered 2023-01-23 – 2023-01-24 (×2): 18 [IU] via SUBCUTANEOUS
  Filled 2023-01-23 (×3): qty 0.18

## 2023-01-23 MED ORDER — INSULIN ASPART 100 UNIT/ML IJ SOLN
0.0000 [IU] | Freq: Every day | INTRAMUSCULAR | Status: DC
  Filled 2023-01-23: qty 1

## 2023-01-23 MED ORDER — INSULIN ASPART 100 UNIT/ML IJ SOLN
0.0000 [IU] | Freq: Three times a day (TID) | INTRAMUSCULAR | Status: DC
  Administered 2023-01-23 – 2023-01-24 (×4): 3 [IU] via SUBCUTANEOUS
  Administered 2023-01-25: 2 [IU] via SUBCUTANEOUS
  Administered 2023-01-25: 3 [IU] via SUBCUTANEOUS
  Filled 2023-01-23 (×5): qty 1

## 2023-01-23 MED ORDER — ENOXAPARIN SODIUM 60 MG/0.6ML IJ SOSY
60.0000 mg | PREFILLED_SYRINGE | INTRAMUSCULAR | Status: DC
  Administered 2023-01-23 – 2023-01-24 (×2): 60 mg via SUBCUTANEOUS
  Filled 2023-01-23 (×2): qty 0.6

## 2023-01-23 NOTE — Consult Note (Signed)
ELECTROPHYSIOLOGY CONSULT NOTE    Patient ID: Erik Black MRN: [id number], DOB/AGE: 1950/12/29 72 y.o.  Admit date: 01/22/2023 Date of Consult: 01/23/2023  Primary Physician: [facility] Primary Cardiologist: Debbe Odea, MD  Electrophysiologist: Dr. Lalla Brothers   Referring Provider: Dr. Clide Dales  Patient Profile: Erik Black is a 72 y.o. male with a history of nonobs CAD, HFrEF, NICM, PVCs, HTN, OSA, T2DM who is being seen today for the evaluation of chest pain, dizziness, word finding difficulty at the request of Dr. Clide Dales.  HPI:  Erik Black is a 72 y.o. male with PMH as above was in his usual state of health until yesterday. He was on a zoom prayer call and noticed that he was having word finding difficulties and slurring of speech. He thought he needed some air, and asked his wife to come into the room. When he stood up, he was very dizzy and noticed his heart was beating hard in chest. He thought he needed to lay down, and so went into bedroom to rest. At this point, he continued to feel unwell and so he and hs wife decided to proceed to ER for further evaluation. During walk to the car, he felt as though his wife was pulling him but now realizes she was holding him upright as he was unable to walk unassisted.  While en route, noted that his speech no longer sounded slurred, and balance was improved.  Neurology workup has included MRI brain and CTA head/neck, both of which have not revealed intracranial process to explain the patient's symptoms.   He is planned for ICD placement next week with Dr. Lalla Brothers for primary prevention for his persistent HFrEF.   He continues to feel chest heaviness, but denies dizziness, chest pain, no word finding difficulties.  Labs Potassium4.4 (07/24 0526)   Creatinine, ser  1.70* (07/24 0526) PLT  144* (07/24 0526) HGB  13.9 (07/24 0526) WBC 5.8 (07/24 0526) Troponin I (High Sensitivity)9 (07/23 1759).    Past  Medical History:  Diagnosis Date   Anxiety    Arthritis    CHF (congestive heart failure) (HCC)    Depression    Diabetes mellitus without complication (HCC)    GERD (gastroesophageal reflux disease)    Glaucoma    both eyes   Hypertension    Sleep apnea    uses Cpap nightly     Surgical History:  Past Surgical History:  Procedure Laterality Date   CARPAL TUNNEL RELEASE Bilateral    COLONOSCOPY     EYE SURGERY Bilateral    cataract surgery with lens implants   KNEE ARTHROPLASTY Right    KNEE ARTHROPLASTY Left    KNEE ARTHROSCOPY Right    KNEE ARTHROSCOPY Left    MULTIPLE TOOTH EXTRACTIONS     RIGHT/LEFT HEART CATH AND CORONARY ANGIOGRAPHY Bilateral 05/03/2022   Procedure: RIGHT/LEFT HEART CATH AND CORONARY ANGIOGRAPHY;  Surgeon: Marykay Lex, MD;  Location: [facility] INVASIVE CV [facility];  Service: Cardiovascular;  Laterality: Bilateral;   ROTATOR CUFF REPAIR Right    ROTATOR CUFF REPAIR W/ DISTAL CLAVICLE EXCISION Left    TOTAL HIP ARTHROPLASTY Right 05/11/2019   Procedure: RIGHT TOTAL HIP ARTHROPLASTY ANTERIOR APPROACH;  Surgeon: Tarry Kos, MD;  Location: [facility] OR;  Service: Orthopedics;  Laterality: Right;   TOTAL HIP ARTHROPLASTY Left 11/23/2019   TOTAL HIP ARTHROPLASTY Left 11/23/2019   Procedure: LEFT TOTAL HIP ARTHROPLASTY ANTERIOR APPROACH;  Surgeon: Tarry Kos, MD;  Location: [facility] OR;  Service: Orthopedics;  Laterality:  Left;   VASECTOMY       Medications Prior to Admission  Medication Sig Dispense Refill Last Dose   amLODipine (NORVASC) 10 MG tablet Take 10 mg by mouth daily.   01/21/2023   aspirin EC 81 MG tablet Take 1 tablet (81 mg total) by mouth 2 (two) times daily. 84 tablet 0 01/18/2023   atorvastatin (LIPITOR) 40 MG tablet Take 1 tablet (40 mg total) by mouth at bedtime. 90 tablet 3 01/21/2023   BREZTRI AEROSPHERE 160-9-4.8 MCG/ACT AERO Inhale 1 puff into the lungs 2 (two) times daily.   01/21/2023   Brinzolamide-Brimonidine 1-0.2 % SUSP Apply 1 drop to eye 3  (three) times daily.   01/21/2023   carvedilol (COREG) 25 MG tablet TAKE 1 TABLET BY MOUTH TWICE A DAY 180 tablet 1 01/21/2023   Cholecalciferol (VITAMIN D) 50 MCG (2000 UT) tablet Take 4,000 Units by mouth daily.    01/21/2023   docusate sodium (COLACE) 100 MG capsule Take 100 mg by mouth daily as needed.   prn at unknown   DULoxetine (CYMBALTA) 60 MG capsule Take 60 mg by mouth daily.   01/21/2023   KLOR-CON M20 20 MEQ tablet TAKE 2 TABLETS BY MOUTH EVERY MORNING AND 1 TABLET IN THE EVENING (Patient taking differently: Take 40 mEq by mouth 2 (two) times daily.) 270 tablet 1 01/21/2023   latanoprost (XALATAN) 0.005 % ophthalmic solution Place 1 drop into both eyes at bedtime.   01/21/2023   mexiletine (MEXITIL) 200 MG capsule TAKE 1 CAPSULE BY MOUTH TWICE A DAY 90 capsule 3 01/21/2023   Multiple Vitamins-Minerals (MULTIVITAMIN WITH MINERALS) tablet Take 1 tablet by mouth daily.   01/21/2023   Omega-3 1000 MG CAPS Take 2,000 mg by mouth 3 (three) times daily after meals.   01/21/2023   omeprazole (PRILOSEC) 20 MG capsule Take 20 mg by mouth 2 (two) times daily.   01/21/2023   OXcarbazepine (TRILEPTAL) 150 MG tablet Take 150 mg by mouth 2 (two) times daily.   01/21/2023   sacubitril-valsartan (ENTRESTO) 97-103 MG Take 1 tablet by mouth 2 (two) times daily. 60 tablet 6 01/21/2023   Semaglutide,0.25 or 0.5MG /DOS, 2 MG/3ML SOPN Inject 0.5 mg into the skin once a week. 3 mL 0 01/19/2023   spironolactone (ALDACTONE) 25 MG tablet TAKE 1 TABLET (25 MG TOTAL) BY MOUTH DAILY. 90 tablet 2 01/21/2023   timolol (TIMOPTIC-XR) 0.5 % ophthalmic gel-forming Place 1 drop into both eyes daily.   01/21/2023   torsemide (DEMADEX) 20 MG tablet TAKE 2 TABLETS (40 MG TOTAL) BY MOUTH DAILY. 180 tablet 1 01/21/2023    Inpatient Medications:   amLODipine  10 mg Oral Daily   aspirin EC  81 mg Oral Daily   atorvastatin  40 mg Oral QHS   brinzolamide  1 drop Both Eyes TID   And   brimonidine  1 drop Both Eyes TID   carvedilol  25 mg  Oral BID   DULoxetine  60 mg Oral Daily   enoxaparin (LOVENOX) injection  60 mg Subcutaneous Q24H   latanoprost  1 drop Both Eyes QHS   mexiletine  200 mg Oral BID   multivitamin with minerals  1 tablet Oral Daily   OXcarbazepine  150 mg Oral BID   pantoprazole  40 mg Oral Daily   timolol  1 drop Both Eyes Daily    Allergies:  Allergies  Allergen Reactions   Empagliflozin Other (See Comments)    Yeast infection  Other Reaction(s): Bleeding skin  Methadone    Oxycodone Itching    Hands started peeling and "could not swallow"   Lisinopril Cough    Family History  Problem Relation Age of Onset   Hypertension Mother    Diabetes Mother    Heart disease Mother    Heart disease Father    Heart disease Brother      Physical Exam: Vitals:   01/22/23 1958 01/23/23 0001 01/23/23 0353 01/23/23 0746  BP: 128/86 131/84 120/78 127/79  Pulse: 74 72 66 67  Resp: 18   18  Temp: 97.8 F (36.6 C) 98.6 F (37 C) 97.9 F (36.6 C) 98 F (36.7 C)  TempSrc: Oral Oral Oral Oral  SpO2: 100% 95% 100% 96%  Weight:      Height:        GEN- NAD, A&O x 3, normal affect HEENT: Normocephalic, atraumatic Lungs- CTAB, Normal effort.  Heart- Regular rate and rhythm, No M/G/R.  GI- Soft, NT, obese easily compresible.  Extremities- No clubbing, cyanosis, or edema   Radiology/Studies: MR BRAIN WO CONTRAST  Result Date: 01/22/2023 CLINICAL DATA:  Neuro deficit, acute, stroke suspected. Altered mental status. EXAM: MRI HEAD WITHOUT CONTRAST TECHNIQUE: Multiplanar, multiecho pulse sequences of the brain and surrounding structures were obtained without intravenous contrast. COMPARISON:  Head CT and CTA head/neck 01/22/2023. FINDINGS: Brain: No acute infarct or hemorrhage. Moderate chronic small-vessel disease. No mass or midline shift. No hydrocephalus or extra-axial collection. No abnormal susceptibility. Vascular: Normal flow voids. Skull and upper cervical spine: Normal marrow signal.  Sinuses/Orbits: Mucous retention cysts in the right maxillary sinus. Orbits are unremarkable. Other: None. IMPRESSION: 1. No acute intracranial process. 2. Moderate chronic small-vessel disease. Electronically Signed   By: Orvan Falconer M.D.   On: 01/22/2023 17:52   CT Angio Chest PE W/Cm &/Or Wo Cm  Result Date: 01/22/2023 CLINICAL DATA:  Altered mental status, slurred speech, chest pain. EXAM: CT ANGIOGRAPHY CHEST WITH CONTRAST TECHNIQUE: Multidetector CT imaging of the chest was performed using the standard protocol during bolus administration of intravenous contrast. Multiplanar CT image reconstructions and MIPs were obtained to evaluate the vascular anatomy. RADIATION DOSE REDUCTION: This exam was performed according to the departmental dose-optimization program which includes automated exposure control, adjustment of the mA and/or kV according to patient size and/or use of iterative reconstruction technique. CONTRAST:  75mL OMNIPAQUE IOHEXOL 350 MG/ML SOLN COMPARISON:  Chest CT dated 06/01/2022. FINDINGS: Cardiovascular: Preferential opacification of the thoracic aorta limits the sensitivity of the exam for evaluation of pulmonary embolism. Given this limitation, no large pulmonary embolism is identified. Vascular calcifications are seen in the coronary arteries and aortic arch. The heart is mildly enlarged. No pericardial effusion. Mediastinum/Nodes: No enlarged mediastinal, hilar, or axillary lymph nodes. A 1.7 cm hypoattenuating nodule in the left thyroid appears similar to 06/01/2022. The trachea and esophagus demonstrate no significant findings. Lungs/Pleura: There are mild scattered bilateral ground-glass opacities and mild bilateral dependent atelectasis. A large bulla in the right upper lobe with nearby atelectasis/scarring of the right upper and right middle lobes appear similar to prior exam. The previously described ground-glass nodules in the left upper lobe have since resolved, consistent  with a infectious/inflammatory process. No pleural effusion or pneumothorax. Upper Abdomen: No acute abnormality. Musculoskeletal: Degenerative changes are seen in the spine. Review of the MIP images confirms the above findings. IMPRESSION: 1. Limited sensitivity of the exam for pulmonary embolism due to bolus timing. Given this limitation, no large pulmonary embolism. 2. Mild scattered bilateral ground-glass opacities may  reflect atelectasis or mild pulmonary edema. Aortic Atherosclerosis (ICD10-I70.0). Electronically Signed   By: Romona Curls M.D.   On: 01/22/2023 13:02   CT ANGIO HEAD NECK W WO CM (CODE STROKE)  Result Date: 01/22/2023 CLINICAL DATA:  Neuro deficit, acute, stroke suspected EXAM: CT ANGIOGRAPHY HEAD AND NECK WITH AND WITHOUT CONTRAST TECHNIQUE: Multidetector CT imaging of the head and neck was performed using the standard protocol during bolus administration of intravenous contrast. Multiplanar CT image reconstructions and MIPs were obtained to evaluate the vascular anatomy. Carotid stenosis measurements (when applicable) are obtained utilizing NASCET criteria, using the distal internal carotid diameter as the denominator. RADIATION DOSE REDUCTION: This exam was performed according to the departmental dose-optimization program which includes automated exposure control, adjustment of the mA and/or kV according to patient size and/or use of iterative reconstruction technique. CONTRAST:  75mL OMNIPAQUE IOHEXOL 350 MG/ML SOLN COMPARISON:  None Available. FINDINGS: CTA NECK FINDINGS Aortic arch: Great vessel origins are patent without significant stenosis. Right carotid system: No evidence of dissection, stenosis (50% or greater), or occlusion. Left carotid system: No evidence of dissection, stenosis (50% or greater), or occlusion. Vertebral arteries: Left dominant. No evidence of dissection, stenosis (50% or greater), or occlusion. Skeleton: No acute abnormality on limited assessment. Bridging  osteophytes. Other neck: Subcentimeter thyroid nodules not require further imaging follow-up (ref: J Am Coll Radiol. 2015 Feb;12(2): 143-50). Upper chest: Evaluated on same day CTA chest. Review of the MIP images confirms the above findings CTA HEAD FINDINGS Anterior circulation: Bilateral intracranial ICAs, MCAs, and ACAs are patent without proximal hemodynamically significant stenosis. No aneurysm. Posterior circulation: Bilateral intradural vertebral arteries, basilar artery and bilateral posterior cerebral arteries are patent without proximal hemodynamically significant stenosis. Venous sinuses: As permitted by contrast timing, patent. Review of the MIP images confirms the above findings IMPRESSION: No emergent large vessel occlusion or proximal hemodynamically significant stenosis. Electronically Signed   By: Feliberto Harts M.D.   On: 01/22/2023 12:07   CT HEAD WO CONTRAST ( )  Result Date: 01/22/2023 CLINICAL DATA:  Altered mental status, nontraumatic (Ped 0-17y) EXAM: CT HEAD WITHOUT CONTRAST TECHNIQUE: Contiguous axial images were obtained from the base of the skull through the vertex without intravenous contrast. RADIATION DOSE REDUCTION: This exam was performed according to the departmental dose-optimization program which includes automated exposure control, adjustment of the mA and/or kV according to patient size and/or use of iterative reconstruction technique. COMPARISON:  None Available. FINDINGS: Brain: No evidence of acute infarction, hemorrhage, hydrocephalus, extra-axial collection or mass lesion/mass effect. Sequela of mild chronic microvascular ischemic change. Vascular: No hyperdense vessel or unexpected calcification. Skull: Normal. Negative for fracture or focal lesion. Sinuses/Orbits: No middle ear or mastoid effusion. Paranasal sinuses are notable for polypoid mucosal thickening in the right maxillary sinus. Bilateral lens replacement. Orbits are otherwise unremarkable. Other: None.  IMPRESSION: No acute intracranial abnormality. Electronically Signed   By: Lorenza Cambridge M.D.   On: 01/22/2023 10:27   DG Chest 2 View  Result Date: 01/22/2023 CLINICAL DATA:  Chest pain EXAM: CHEST - 2 VIEW COMPARISON:  06/01/2022 FINDINGS: Eventration of the diaphragm. There is some linear opacity along the lung bases likely scar or atelectasis. No consolidation, pneumothorax or edema. Normal cardiopericardial silhouette. Calcified aorta. Degenerative changes of the spine with bridging syndesmophytes diffusely. Films are under penetrated. IMPRESSION: Under penetrated radiographs. Basilar areas of scarring and atelectatic change. Electronically Signed   By: Karen Kays M.D.   On: 01/22/2023 10:22   LONG TERM MONITOR (3-14 DAYS)  Result Date: 12/30/2022 Patch Wear Time:  13 days and 19 hours (2024-06-05T10:59:39-0400 to 2024-06-19T06:33:55-0400) 1. Sinus rhythm -  avg HR of 80 bpm. 2. Bundle Branch Block/IVCD was present. 3. Two runs of nonsustained Ventricular Tachycardia occurred, the run with the fastest interval lasting 4 beats with a max rate of 171 bpm, the longest lasting 4 beats with an avg rate of 148 bpm. 4. Seven runs of Supraventricular Tachycardia occurred, the run with the fastest interval lasting 7 beats with a max rate of 176 bpm, the longest lasting 12.7 secs with an avg rate of 120 bpm. 5. Occasional PVCs (2.1%, 32785) 6. Ventricular Bigeminy and Trigeminy were present. Arvilla Meres, MD 1:53 PM   EKG: NSR, rate 82; (personally reviewed)  TELEMETRY: NSR (personally reviewed)   Assessment/Plan: #) Dizziness #) word-finding difficulty #) slurred speech #) arm numbness Patient symptoms do not appear to be related to tachyarrythmia and more likely a neurological process Brain MRI and CTA head/neck negative   #) HFrEF #) NICM Persistently reduced LVEF on GDMT NYHA III Warm and dry on exam Planned for ICD placement with Dr. Lalla Brothers 7/29 for primary prevention  #)  PVCs Very rare on telemetry Continue mexitil 200mg  daily   For questions or updates, please contact [facility] Please consult [email or web address] for contact info under Cardiology/STEMI.  Signed, Sherie Don, NP  01/23/2023 8:43 AM

## 2023-01-23 NOTE — Evaluation (Signed)
Occupational Therapy Evaluation Patient Details Name: Erik Black MRN: [id number] DOB: 05-23-1951 Today's Date: 01/23/2023   History of Present Illness Erik Black is a 72 y/o M with PMH including CHF, DM2, HTN, BIL THA, BIL TKA, CKD3. Admitted with stroke-like symptoms (difficulty walking, slurred speech, tingling/numbness LUE).   Clinical Impression   Patient received for OT evaluation.  Pt appears to be at functional baseline and is (I) for ADLs and mobility without AD at this time. All symptoms noted on arrival to hospital have resolved. Patient with no further need for OT in acute care; discharge OT services.       Recommendations for follow up therapy are one component of a multi-disciplinary discharge planning process, led by the attending physician.  Recommendations may be updated based on patient status, additional functional criteria and insurance authorization.   Assistance Recommended at Discharge None  Patient can return home with the following      Functional Status Assessment  Patient has not had a recent decline in their functional status  Equipment Recommendations  None recommended by OT    Recommendations for Other Services       Precautions / Restrictions Precautions Precautions: None Restrictions Weight Bearing Restrictions: No      Mobility Bed Mobility Overal bed mobility: Independent                  Transfers Overall transfer level: Independent Equipment used: None               General transfer comment: Walked a lap around the unit without assistance; no loss of balance; moderate pace.      Balance Overall balance assessment: Independent                                         ADL either performed or assessed with clinical judgement   ADL Overall ADL's : Independent                                       General ADL Comments: Demonstrates sit to stand without issue; figure  four without issue. BIL UE and LE WNL.     Vision Baseline Vision/History:  (glasses for reading only) Ability to See in Adequate Light: 0 Adequate Patient Visual Report: No change from baseline       Perception     Praxis      Pertinent Vitals/Pain Pain Assessment Pain Assessment: No/denies pain     Hand Dominance Right   Extremity/Trunk Assessment Upper Extremity Assessment Upper Extremity Assessment: Overall WFL for tasks assessed (states all symptoms have resolved. Has 4+/5 strength BIL.)   Lower Extremity Assessment Lower Extremity Assessment: Overall WFL for tasks assessed (no weakness noted)   Cervical / Trunk Assessment Cervical / Trunk Assessment: Normal   Communication Communication Communication: No difficulties   Cognition Arousal/Alertness: Awake/alert Behavior During Therapy: WFL for tasks assessed/performed Overall Cognitive Status: Within Functional Limits for tasks assessed                                 General Comments: A+Ox4; very pleasant.     General Comments       Exercises     Shoulder Instructions      Home Living  Family/patient expects to be discharged to:: Private residence Living Arrangements: Spouse/significant other Available Help at Discharge: Available 24 hours/day;Family Type of Home: House Home Access: Stairs to enter [employer] of Steps: 3 Entrance Stairs-Rails: Right;Left Home Layout: One level     Bathroom Shower/Tub: Tub/shower unit (has portable shower unit that he uses in his kitchen with a shower chair)   Bathroom Toilet: Handicapped height     Home Equipment: Grab bars - toilet          Prior Functioning/Environment Prior Level of Function : Independent/Modified Independent;Driving             Mobility Comments: (I), no AD. ADLs Comments: (I) with ADL/IADL. Enjoys church and [employer]. Has a tractor and mows the yard and churchyard.        OT Problem  List:        OT Treatment/Interventions:      OT Goals(Current goals can be found in the care plan section) Acute Rehab OT Goals Patient Stated Goal: Go home OT Goal Formulation: All assessment and education complete, DC therapy (Education provided on signs/symptoms of stroke and to come back to hospital immediately if symptoms are noted.)  OT Frequency:      Co-evaluation              AM-PAC OT "6 Clicks" Daily Activity     Outcome Measure Help from another person eating meals?: None Help from another person taking care of personal grooming?: None Help from another person toileting, which includes using toliet, bedpan, or urinal?: None Help from another person bathing (including washing, rinsing, drying)?: None Help from another person to put on and taking off regular upper body clothing?: None Help from another person to put on and taking off regular lower body clothing?: None 6 Click Score: 24   End of Session Nurse Communication: Mobility status  Activity Tolerance: Patient tolerated treatment well Patient left: in bed;with call bell/phone within reach                   Time: 0944-1000 OT Time Calculation (min): 16 min Charges:  OT General Charges $OT Visit: 1 Visit OT Evaluation $OT Eval Moderate Complexity: 1 Mod  Yuna Pizzolato Junie Panning, MS, OTR/L  Alvester Morin 01/23/2023, 10:11 AM

## 2023-01-23 NOTE — Progress Notes (Signed)
*  PRELIMINARY RESULTS* Echocardiogram 2D Echocardiogram has been performed.  Erik Black 01/23/2023, 11:26 AM

## 2023-01-23 NOTE — Progress Notes (Signed)
SLP Cancellation Note  Patient Details Name: Erik Black MRN: [id number] DOB: 1951-05-09   Cancelled treatment:       Reason Eval/Treat Not Completed: SLP screened, no needs identified, will sign off (chart reviewed; consulted NSG then met w/ pt in room.)  Pt denied any difficulty swallowing and is currently on a regular diet; tolerates swallowing pills w/ water per NSG. He had just finished his dinner meal. Pt conversed in conversation w/out expressive/receptive communication deficits noted; pt denied any speech-language deficits. He denied any slurred speech today stating is was "gone" today. Speech intelligible, clear during conversation. Pt looked at menu and discussed breakfast meal options as well as his pending cardiac procedure w/ this SLP w/ good topic maintenance and fluent, clear speech. No further skilled ST services indicated as pt appears at his communication baseline. Pt agreed. NSG to reconsult if any change in status while admitted.       Jerilynn Som, MS, CCC-SLP Speech Language Pathologist Rehab Services; [facility] [phone] (ascom) Linlee Cromie 01/23/2023, 6:26 PM

## 2023-01-23 NOTE — Progress Notes (Signed)
Progress Note   Patient: Erik Black [id number]:[id number] DOB: 06-26-1951 DOA: 01/22/2023     0 DOS: the patient was seen and examined on 01/23/2023   Brief hospital course: Erik Black is a 72 y.o. male with PMH as above was in his usual state of health until yesterday. He was on a zoom prayer call and noticed that he was having word finding difficulties and slurring of speech. He thought he needed some air, and asked his wife to come into the room. When he stood up, he was very dizzy and noticed his heart was beating hard in chest. He thought he needed to lay down, and so went into bedroom to rest. At this point, he continued to feel unwell and so he and hs wife decided to proceed to ER for further evaluation. During walk to the car, he felt as though his wife was pulling him but now realizes she was holding him upright as he was unable to walk unassisted. While en route, noted that his speech no longer sounded slurred, and balance was improved. Neurology workup has included MRI brain and CTA head/neck, both of which have not revealed intracranial process to explain the patient's symptoms.    He is planned for ICD placement next week with Dr. Lalla Brothers for primary prevention for his persistent HFrEF.  Assessment and Plan: * Acute CVA (cerebrovascular accident) (HCC) Continue observation under medically monitored bed.   Follow neuro checks q.4 hours for 24 hours.   Continue aspirin, statin Brain MRI, head and neck CTA and 2D unremarkable. Echo with bubble study showed EF 25-30% global hypokinesis, no interatrial shunt. Neurology feel the symptoms could be due to cardiac arrhythmias.  Chest pain, tachyarrhythmias Troponins unremarkable.  Patient is chest pain-free Continue aspirin, sublingual nitroglycerin, statin. Telemetry shows frequent PVCs. Cardiology evaluation appreciated.  EP physician consulted who advised to continue to observe for another day.  Patient has plan for ICD  placement 729.  Acute on chronic systolic CHF (congestive heart failure) (HCC) Echo with bubble study showed EF 25-30% global hypokinesis, no interatrial shunt. He got IV Lasix one-time dose. Continue Coreg, mexiletine Heart failure team advised to hold diuretics for now.  Restart Entresto tomorrow.  Dyslipidemia Continue statin therapy.  Type 2 diabetes mellitus with peripheral neuropathy (HCC) A1c 10.5, discussed with diabetes educator.  Started on Semglee 18 units, Accu-Cheks, sliding scale insulin ACHS.  Essential hypertension Patient will be continued on home dose Coreg, Norvasc, therapy.  DVT prophylaxis Lovenox. Continue nursing supportive care. Fall and aspiration precautions. CODE STATUS full code.     Subjective: Patient is seen and examined today morning.  He is lying comfortably denies any complaints.  Feels anxious to go home without ICD due to his symptoms of not feeling well ? TIA versus arrhythmias.  Physical Exam: Vitals:   01/23/23 0001 01/23/23 0353 01/23/23 0746 01/23/23 1151  BP: 131/84 120/78 127/79 (!) 137/92  Pulse: 72 66 67 64  Resp:   18 17  Temp: 98.6 F (37 C) 97.9 F (36.6 C) 98 F (36.7 C) 97.7 F (36.5 C)  TempSrc: Oral Oral Oral   SpO2: 95% 100% 96% 100%  Weight:      Height:       General - Elderly obese African-American male, no apparent distress HEENT - PERRLA, EOMI, atraumatic head, non tender sinuses. Lung - Clear, rales, rhonchi, wheezes. Heart - S1, S2 heard, no murmurs, rubs, trace pedal edema Neuro - Alert, awake and oriented x 3, non focal exam.  Skin - Warm and dry. Data Reviewed:     Latest Ref Rng & Units 01/23/2023    5:26 AM 01/22/2023    9:35 AM 01/10/2023    4:17 PM  CBC  WBC 4.0 - 10.5 K/uL 5.8  9.4    9.5  5.8   Hemoglobin 13.0 - 17.0 g/dL 95.1  88.4    16.6  06.3   Hematocrit 39.0 - 52.0 % 42.4  45.3    45.4  43.8   Platelets 150 - 400 K/uL 144  162    157  148       Latest Ref Rng & Units 01/23/2023     5:26 AM 01/22/2023    9:35 AM 01/10/2023    4:17 PM  BMP  Glucose 70 - 99 mg/dL 016  010    932  355   BUN 8 - 23 mg/dL 24  20    22  18    Creatinine 0.61 - 1.24 mg/dL 7.32  2.02    5.42  7.06   Sodium 135 - 145 mmol/L 136  133    135  136   Potassium 3.5 - 5.1 mmol/L 4.4  4.3    4.2  4.1   Chloride 98 - 111 mmol/L 103  102    102  103   CO2 22 - 32 mmol/L 27  21    24  24    Calcium 8.9 - 10.3 mg/dL 9.2  9.3    9.4  9.7      Family Communication: Patient understands and agrees with above care plan, seems nervous to go home without an ICD.  Disposition: Status is: Observation The patient remains OBS appropriate and will d/c before 2 midnights.  Planned Discharge Destination: Home    Time spent: 43 minutes  Author: Marcelino Duster, MD 01/23/2023 3:56 PM  For on call review [email or web address].

## 2023-01-23 NOTE — Progress Notes (Signed)
PHARMACIST - PHYSICIAN COMMUNICATION  CONCERNING:  Enoxaparin (Lovenox) for DVT Prophylaxis    RECOMMENDATION: Patient was prescribed enoxaparin 40mg  q24 hours for VTE prophylaxis.   Filed Weights   01/22/23 0929  Weight: 124.5 kg (274 lb 7.6 oz)    Body mass index is 37.23 kg/m.  Estimated Creatinine Clearance: 55.1 mL/min (A) (by C-G formula based on SCr of 1.7 mg/dL (H)).   Based on [facility] policy patient is candidate for enoxaparin 0.5mg /kg TBW SQ every 24 hours based on BMI being >30.  DESCRIPTION: Pharmacy has adjusted enoxaparin dose per [facility] policy.  Patient is now receiving enoxaparin 60 mg every 24 hours    Tressie Ellis 01/23/2023 7:26 AM

## 2023-01-23 NOTE — Plan of Care (Addendum)
MRI brain: No acute intracranial process. Moderate chronic small-vessel disease.  CTA of head and neck: No emergent large vessel occlusion or proximal hemodynamically significant stenosis.  TTE report is pending.   Cardiology has seen the patient. Principal components of their DDx are TIA versus tachyarrhythmia that may have precipitated the patient's symptoms. They recommend restarting ASA, which had been on hold for ICD placement. Electrophysiology to see the patient today. His atypical CP was not felt to be consistent with ACS given negative troponins and clinical presentation. Above assessment in the context of a history of HFrEF due to nonischemic cardiomyopathy (LVEF 20-25% by most recent echo in 08/2022) and nonobstructive CAD. He does not appear to be volume overloaded per Cardiology.   A/R: 72 year old male with DM, HTN and a history of cardiac arrhythmia presenting with acute onset of left face and arm numbness with slurred speech and dizziness  - Agree with restarting ASA.  - Given negative MRI brain and unremarkable CTA, transient hypotension from possible cardiac arrhythmia is felt to be the most likely etiology for the patient's presenting symptoms.  - TTE report is pending.   Addendum:  TTE:  1. Left ventricular ejection fraction, by estimation, is 25 to 30%. Left  ventricular ejection fraction by PLAX is 29 %. The left ventricle has  severely decreased function. The left ventricle demonstrates global  hypokinesis. The left ventricular internal  cavity size was mildly dilated.   2. Right ventricular systolic function is normal. The right ventricular  size is normal. Tricuspid regurgitation signal is inadequate for assessing  PA pressure.   3. The mitral valve is normal in structure. No evidence of mitral valve  regurgitation. No evidence of mitral stenosis.   4. The aortic valve is normal in structure. Aortic valve regurgitation is  not visualized. No aortic stenosis is  present.   5. The inferior vena cava is normal in size with greater than 50%  respiratory variability, suggesting right atrial pressure of 3 mmHg.   Electronically signed: Dr. Caryl Pina

## 2023-01-23 NOTE — Inpatient Diabetes Management (Signed)
Inpatient Diabetes Program Recommendations  AACE/ADA: New Consensus Statement on Inpatient Glycemic Control (2015)  Target Ranges:  Prepandial:   less than 140 mg/dL      Peak postprandial:   less than 180 mg/dL (1-2 hours)      Critically ill patients:  140 - 180 mg/dL   Lab Results  Component Value Date   GLUCAP 268 (H) 01/22/2023   HGBA1C 10.5 (H) 01/22/2023    Review of Glycemic Control  Latest Reference Range & Units 01/22/23 09:28  Glucose-Capillary 70 - 99 mg/dL 191 (H)   Diabetes history: DM Outpatient Diabetes medications:  Ozempic 0.5 mg daily Current orders for Inpatient glycemic control:  None  Inpatient Diabetes Program Recommendations:    Please consider adding Novolog correction sensitive tid with meals and HS.  A1C is much greater than goal.  May need addition of basal insulin as well.  Consider adding Semglee 18 units daily.   Thanks,  Beryl Meager, RN, BC-ADM Inpatient Diabetes Coordinator Pager [phone]  (8a-5p)

## 2023-01-23 NOTE — Progress Notes (Signed)
PT Cancellation Note  Patient Details Name: Erik Black MRN: [id number] DOB: 29-May-1951   Cancelled Treatment:    Reason Eval/Treat Not Completed: PT screened, no needs identified, will sign off PT orders received, chart reviewed. Per OT, pt is independent & at baseline. PT spoke with PT who confirms this, also noting all symptoms have resolved. PT encouraged pt to continue ambulating while in house & pt agreeable. PT to complete current orders at this time, please re-consult if new needs arise.  Aleda Grana, PT, DPT 01/23/23, 10:50 AM   Sandi Mariscal 01/23/2023, 10:48 AM

## 2023-01-23 NOTE — Care Management Obs Status (Signed)
MEDICARE OBSERVATION STATUS NOTIFICATION   Patient Details  Name: Erik Black MRN: [id number] Date of Birth: October 07, 1950   Medicare Observation Status Notification Given:  Yes    Mazella Deen, LCSW 01/23/2023, 3:45 PM

## 2023-01-23 NOTE — Progress Notes (Signed)
ADVANCED HEART FAILURE CONSULT NOTE  Referring Physician: No ref. provider found  Primary Care: [facility] Primary Cardiologist: Debbe Odea, MD HF: Dr. Gala Romney  HPI: Erik Black is a 72 y.o. male with nonobstructive CAD, HFrEF secondary to nonischemic cardiomyopathy, type 2 diabetes, obstructive sleep apnea and morbid obesity that presented to [facility] with difficulty speaking and ambulating associated with dizziness, generalized weakness and numbness.  Around that same time he reports having substernal chest pressure and palpitations.  He was brought to the emergency department by his wife however symptoms had resolved by that time.  In the emergency department workup was mostly unremarkable.  EKG without ischemic changes, serum creatinine 2.1, high-sensitivity troponin 11 and brain MRI with no acute processes.  His cardiac history dates back to September 2023 when he was diagnosed with heart failure with an EF of 30 to 35%.  Left heart cath in 11/23 with 60% LAD, 55% left circumflex and 30% RCA lesion with a Fick cardiac index of 2.3.  He had a follow-up cardiac MRI with an EF of 25% and mid wall LGE consistent with nonischemic cardiomyopathy and otherwise no infiltrative disease.  Zio patch in November with 13.6% PVC burden.  Since that time he followed regularly with Dr. Sampson Goon in heart failure clinic.  He has 4 brothers with heart failure, 1 with an ICD.  Past Medical History:  Diagnosis Date   Anxiety    Arthritis    CHF (congestive heart failure) (HCC)    Depression    Diabetes mellitus without complication (HCC)    GERD (gastroesophageal reflux disease)    Glaucoma    both eyes   Hypertension    Sleep apnea    uses Cpap nightly    Current Facility-Administered Medications  Medication Dose Route Frequency Provider Last Rate Last Admin   acetaminophen (TYLENOL) tablet 650 mg  650 mg Oral Q6H PRN Mansy, Jan A, MD       Or   acetaminophen (TYLENOL)  suppository 650 mg  650 mg Rectal Q6H PRN Mansy, Jan A, MD       amLODipine (NORVASC) tablet 10 mg  10 mg Oral Daily Mansy, Jan A, MD   10 mg at 01/22/23 1434   aspirin EC tablet 81 mg  81 mg Oral Daily Mansy, Jan A, MD   81 mg at 01/23/23 0810   atorvastatin (LIPITOR) tablet 40 mg  40 mg Oral QHS Mansy, Jan A, MD   40 mg at 01/22/23 2155   brinzolamide (AZOPT) 1 % ophthalmic suspension 1 drop  1 drop Both Eyes TID Mansy, Jan A, MD   1 drop at 01/23/23 0815   And   brimonidine (ALPHAGAN) 0.2 % ophthalmic solution 1 drop  1 drop Both Eyes TID Mansy, Jan A, MD   1 drop at 01/23/23 0814   carvedilol (COREG) tablet 25 mg  25 mg Oral BID Mansy, Jan A, MD   25 mg at 01/22/23 2155   docusate sodium (COLACE) capsule 100 mg  100 mg Oral Daily PRN Mansy, Jan A, MD       DULoxetine (CYMBALTA) DR capsule 60 mg  60 mg Oral Daily Mansy, Jan A, MD   60 mg at 01/23/23 0810   enoxaparin (LOVENOX) injection 60 mg  60 mg Subcutaneous Q24H Dorothea Ogle B, RPH       latanoprost (XALATAN) 0.005 % ophthalmic solution 1 drop  1 drop Both Eyes QHS Mansy, Jan A, MD   1 drop at  01/22/23 2208   magnesium hydroxide (MILK OF MAGNESIA) suspension 30 mL  30 mL Oral Daily PRN Mansy, Jan A, MD       mexiletine (MEXITIL) capsule 200 mg  200 mg Oral BID Mansy, Jan A, MD   200 mg at 01/23/23 1324   multivitamin with minerals tablet 1 tablet  1 tablet Oral Daily Mansy, Jan A, MD   1 tablet at 01/23/23 0810   ondansetron (ZOFRAN) tablet 4 mg  4 mg Oral Q6H PRN Mansy, Jan A, MD       Or   ondansetron [facility]) injection 4 mg  4 mg Intravenous Q6H PRN Mansy, Jan A, MD       OXcarbazepine (TRILEPTAL) tablet 150 mg  150 mg Oral BID Mansy, Jan A, MD   150 mg at 01/23/23 4010   pantoprazole (PROTONIX) EC tablet 40 mg  40 mg Oral Daily Mansy, Jan A, MD   40 mg at 01/23/23 0810   timolol (TIMOPTIC) 0.5 % ophthalmic solution 1 drop  1 drop Both Eyes Daily Sharen Hones, RPH       traZODone (DESYREL) tablet 25 mg  25 mg Oral QHS PRN Mansy,  Vernetta Honey, MD        Allergies  Allergen Reactions   Empagliflozin Other (See Comments)    Yeast infection  Other Reaction(s): Bleeding skin   Methadone    Oxycodone Itching    Hands started peeling and "could not swallow"   Lisinopril Cough      Social History   Socioeconomic History   Marital status: Married    Spouse name: [name]   Number of children: 0   Years of education: Not on file   Highest education level: Not on file  Occupational History   Not on file  Tobacco Use   Smoking status: Former    Current packs/day: 0.00    Types: Cigarettes    Quit date: 05/26/2019    Years since quitting: 3.6   Smokeless tobacco: Never   Tobacco comments:    smokes a pack a month  Vaping Use   Vaping status: Never Used  Substance and Sexual Activity   Alcohol use: Not Currently   Drug use: Not Currently   Sexual activity: Not on file  Other Topics Concern   Not on file  Social History Narrative   Lives at home with wife [name] and a dog.   Social Determinants of Health   Financial Resource Strain: Not on file  Food Insecurity: Patient Declined (01/22/2023)   Hunger Vital Sign    Worried About Running Out of Food in the Last Year: Patient declined    Ran Out of Food in the Last Year: Patient declined  Transportation Needs: Patient Declined (01/22/2023)   PRAPARE - Administrator, Civil Service (Medical): Patient declined    Lack of Transportation (Non-Medical): Patient declined  Physical Activity: Not on file  Stress: Not on file  Social Connections: Unknown (11/14/2021)   [facility]   Social Network    Social Network: Not on file  Intimate Partner Violence: Patient Declined (01/22/2023)   Humiliation, Afraid, Rape, and Kick questionnaire    Fear of Current or Ex-Partner: Patient declined    Emotionally Abused: Patient declined    Physically Abused: Patient declined    Sexually Abused: Patient declined      Family History  Problem  Relation Age of Onset   Hypertension Mother    Diabetes Mother  Heart disease Mother    Heart disease Father    Heart disease Brother     PHYSICAL EXAM: Vitals:   01/23/23 0353 01/23/23 0746  BP: 120/78 127/79  Pulse: 66 67  Resp:  18  Temp: 97.9 F (36.6 C) 98 F (36.7 C)  SpO2: 100% 96%   GENERAL: Well nourished, well developed, and in no apparent distress at rest.  HEENT: Negative for arcus senilis or xanthelasma. There is no scleral icterus.  The mucous membranes are pink and moist.   NECK: Supple, No masses. Normal carotid upstrokes without bruits. No masses or thyromegaly.    CHEST: There are no chest wall deformities. There is no chest wall tenderness. Respirations are unlabored.  Lungs-CTA bilaterally CARDIAC:  JVP: 8 cm H2O         Normal S1, S2  Normal rate with regular rhythm. No murmurs, rubs or gallops.  Pulses are 2+ and symmetrical in upper and lower extremities.  No edema.  ABDOMEN: Soft, non-tender, non-distended. There are no masses or hepatomegaly. There are normal bowel sounds.  EXTREMITIES: Warm and well perfused with no cyanosis, clubbing.  LYMPHATIC: No axillary or supraclavicular lymphadenopathy.  NEUROLOGIC: Patient is oriented x3 with no focal or lateralizing neurologic deficits.  PSYCH: Patients affect is appropriate, there is no evidence of anxiety or depression.  SKIN: Warm and dry; no lesions or wounds.   DATA REVIEW  ECG: 01/23/23: NSR  As per my personal interpretation  ECHO: 01/19/2023: LVEF 20 to 25%.  Unchanged from echo in March 2024.  ASSESSMENT & PLAN:  Heart failure with reduced EF -Cath in 11/23 with nonobstructive CAD.  Follow-up cardiac MRI with LVEF of 25% with mid wall LGE strip consistent with nonischemic cardiomyopathy.  He had a Ziopatch in November 23 with 13.7% PVC burden that improved to 2% on repeat Zio patch.  His cardiomyopathy is believed to be familial versus PVC. -On my exam today, he is euvolemic and otherwise  appears very well compensated from a heart failure standpoint.  His presenting symptoms are concerning for TIA versus tachyarrhythmia. -At this time would restart home medications including amlodipine 10 mg, Coreg 25 mg twice daily, mexiletine 200 mg twice daily. -Continue to hold diuretics.  Improvement in serum creatinine today.  Will plan to restart [facility] tomorrow -EP to evaluate patient while he is here.  2. AKI on CKD - baseline sCr of 1.5; on arrival sCr was up to 2.1. Now improved to 1.7 today.   3. Stroke-like symptoms  - MR brain with chronic small vessel disease; otherwise, no acute findings.  - CTA head/neck unremarkable - EKG NSR - Telemetry reviewed, normal sinus rhythm.  Short run of bigeminy but otherwise benign.   Hinda Lindor Advanced Heart Failure Mechanical Circulatory Support

## 2023-01-24 ENCOUNTER — Other Ambulatory Visit: Payer: Self-pay

## 2023-01-24 DIAGNOSIS — R079 Chest pain, unspecified: Secondary | ICD-10-CM | POA: Diagnosis not present

## 2023-01-24 DIAGNOSIS — R29818 Other symptoms and signs involving the nervous system: Secondary | ICD-10-CM | POA: Diagnosis not present

## 2023-01-24 DIAGNOSIS — E1142 Type 2 diabetes mellitus with diabetic polyneuropathy: Secondary | ICD-10-CM | POA: Diagnosis not present

## 2023-01-24 DIAGNOSIS — I639 Cerebral infarction, unspecified: Secondary | ICD-10-CM | POA: Diagnosis not present

## 2023-01-24 DIAGNOSIS — R0789 Other chest pain: Secondary | ICD-10-CM | POA: Diagnosis not present

## 2023-01-24 DIAGNOSIS — I5023 Acute on chronic systolic (congestive) heart failure: Secondary | ICD-10-CM | POA: Diagnosis not present

## 2023-01-24 DIAGNOSIS — I10 Essential (primary) hypertension: Secondary | ICD-10-CM | POA: Diagnosis not present

## 2023-01-24 LAB — GLUCOSE, CAPILLARY
Glucose-Capillary: 153 mg/dL — ABNORMAL HIGH (ref 70–99)
Glucose-Capillary: 194 mg/dL — ABNORMAL HIGH (ref 70–99)
Glucose-Capillary: 144 mg/dL — ABNORMAL HIGH (ref 70–99)
Glucose-Capillary: 135 mg/dL — ABNORMAL HIGH (ref 70–99)

## 2023-01-24 LAB — BASIC METABOLIC PANEL
Anion gap: 5 (ref 5–15)
BUN: 17 mg/dL (ref 8–23)
CO2: 23 mmol/L (ref 22–32)
Calcium: 9.4 mg/dL (ref 8.9–10.3)
Chloride: 105 mmol/L (ref 98–111)
Creatinine, Ser: 1.33 mg/dL — ABNORMAL HIGH (ref 0.61–1.24)
GFR, Estimated: 58 mL/min — ABNORMAL LOW (ref >60)
Glucose, Bld: 237 mg/dL — ABNORMAL HIGH (ref 70–99)
Potassium: 4.1 mmol/L (ref 3.5–5.1)
Sodium: 133 mmol/L — ABNORMAL LOW (ref 135–145)

## 2023-01-24 LAB — TROPONIN I (HIGH SENSITIVITY)
Troponin I (High Sensitivity): 9 ng/L (ref <18)
Troponin I (High Sensitivity): 9 ng/L (ref <18)

## 2023-01-24 LAB — MAGNESIUM: Magnesium: 2.5 mg/dL — ABNORMAL HIGH (ref 1.7–2.4)

## 2023-01-24 MED ORDER — SACUBITRIL-VALSARTAN 97-103 MG PO TABS
1.0000 | ORAL_TABLET | Freq: Two times a day (BID) | ORAL | Status: DC
  Administered 2023-01-24 – 2023-01-25 (×2): 1 via ORAL
  Filled 2023-01-24 (×2): qty 1

## 2023-01-24 MED ORDER — SACUBITRIL-VALSARTAN 24-26 MG PO TABS: 1.0000 | ORAL_TABLET | Freq: Two times a day (BID) | ORAL | Status: DC

## 2023-01-24 NOTE — Inpatient Diabetes Management (Signed)
Inpatient Diabetes Program Recommendations  AACE/ADA: New Consensus Statement on Inpatient Glycemic Control (2015)  Target Ranges:  Prepandial:   less than 140 mg/dL      Peak postprandial:   less than 180 mg/dL (1-2 hours)      Critically ill patients:  140 - 180 mg/dL   Lab Results  Component Value Date   GLUCAP 194 (H) 01/24/2023   HGBA1C 10.5 (H) 01/22/2023    Review of Glycemic Control  Latest Reference Range & Units 01/22/23 09:28 01/23/23 16:44 01/23/23 19:51 01/24/23 08:41 01/24/23 12:15  Glucose-Capillary 70 - 99 mg/dL 191 (H) 478 (H) 295 (H) 153 (H) 194 (H)   Diabetes history: DM 2 Outpatient Diabetes medications:  Ozempic weekly ( dose just increased but has not had increased dose yet) Current orders for Inpatient glycemic control:  Novolog 0-15 units tid with meals and HS  Inpatient Diabetes Program Recommendations:    Note that blood sugars improved today.  Spoke to patient at bedside regarding current A1C.  He states that 10.5% is much higher.  He was recently switched to Ozempic and states that MD increased dose but he has not taken yet due to him preparing to be NPO for ICD placement.  In the past he has taken insulin however this was stopped due to improved blood sugars.  He also has had Freestyle Libre in the past but states that the [place] stopped paying for it since he was no longer on insulin.   Anticipate that increased dose of Ozempic will be helpful.  May also benefit from SGLT-2 due to history of heart failure if appropriate.  He states he has appointment at [facility] on 01/31/23 as well.  Encouraged him to monitor blood sugars at least daily and if > 200 mg/dL, he needs to follow up with PCP regarding possible need to restart insulin.   Patient appreciative of visit.    Thanks  Beryl Meager, RN, BC-ADM Inpatient Diabetes Coordinator Pager [phone]  (8a-5p)

## 2023-01-24 NOTE — Progress Notes (Signed)
Progress Note   Patient: Erik Black RKY:[id number] DOB: Nov 17, 1950 DOA: 01/22/2023     0 DOS: the patient was seen and examined on 01/24/2023   Brief hospital course: Erik Black is a 72 y.o. male with PMH as above was in his usual state of health until yesterday. He was on a zoom prayer call and noticed that he was having word finding difficulties and slurring of speech. He thought he needed some air, and asked his wife to come into the room. When he stood up, he was very dizzy and noticed his heart was beating hard in chest. He thought he needed to lay down, and so went into bedroom to rest. At this point, he continued to feel unwell and so he and hs wife decided to proceed to ER for further evaluation. During walk to the car, he felt as though his wife was pulling him but now realizes she was holding him upright as he was unable to walk unassisted. While en route, noted that his speech no longer sounded slurred, and balance was improved. Neurology workup has included MRI brain and CTA head/neck, both of which have not revealed intracranial process to explain the patient's symptoms. He is planned for ICD placement next week with Dr. Lalla Brothers for primary prevention for his persistent HFrEF.  Assessment and Plan: * Acute CVA (cerebrovascular accident) (HCC) Continue observation under medically monitored bed.   Follow neuro checks q.4 hours for 24 hours.   Continue aspirin, statin Brain MRI, head and neck CTA and 2D unremarkable. Echo with bubble study showed EF 25-30% global hypokinesis, no interatrial shunt. Neurology feel the symptoms could be due to cardiac arrhythmias.  Chest pain, tachyarrhythmias Troponins unremarkable.  Patient had another episode of sharp chest pain with radiation to left arm. EKG, trop unremarkable. Continue aspirin, sublingual nitroglycerin, statin. Telemetry got discontinued overnight. Renewed telemetry order for next 24hrs monitoring. EP physician  consulted advised to continue to observe for another day. Patient has plan for ICD placement 729.  Acute on chronic systolic CHF (congestive heart failure) (HCC) Echo with bubble study showed EF 25-30% global hypokinesis, no interatrial shunt. He got IV Lasix one-time dose. Continue Coreg, mexiletine Heart failure team advised to restart Coreg, norvasc, Entresto. BMP reviewed.  Dyslipidemia Continue statin therapy.  Type 2 diabetes mellitus with peripheral neuropathy (HCC) A1c 10.5, discussed with diabetes educator.  Continue Semglee 18 units, Accu-Cheks, sliding scale insulin ACHS.  Essential hypertension Patient will be continued on home dose Coreg, Norvasc, therapy.  DVT prophylaxis Lovenox. Continue nursing supportive care. Fall and aspiration precautions. CODE STATUS full code.     Subjective: Patient is seen and examined today morning.  He is lying comfortably, had an episode of chest pain, sharp radiating to left arm, sudden, transient, relieved by itself. No shortness of breath or diaphoresis.  Physical Exam: Vitals:   01/23/23 1948 01/24/23 0359 01/24/23 0731 01/24/23 0753  BP: 134/85 135/78 (!) 144/76 131/66  Pulse: 70 64 62 62  Resp: 18  12   Temp: 97.7 F (36.5 C) 97.6 F (36.4 C) 97.7 F (36.5 C)   TempSrc: Oral Oral Oral   SpO2: 100% 100% 100% 100%  Weight:      Height:       General - Elderly obese African-American male, no apparent distress HEENT - PERRLA, EOMI, atraumatic head, non tender sinuses. Lung - Clear, rales, rhonchi, wheezes. Heart - S1, S2 heard, no murmurs, rubs, trace pedal edema Neuro - Alert, awake and oriented x 3,  non focal exam. Skin - Warm and dry. Data Reviewed:     Latest Ref Rng & Units 01/23/2023    5:26 AM 01/22/2023    9:35 AM 01/10/2023    4:17 PM  CBC  WBC 4.0 - 10.5 K/uL 5.8  9.4    9.5  5.8   Hemoglobin 13.0 - 17.0 g/dL 30.8  65.7    84.6  96.2   Hematocrit 39.0 - 52.0 % 42.4  45.3    45.4  43.8   Platelets 150 -  400 K/uL 144  162    157  148       Latest Ref Rng & Units 01/24/2023    2:44 PM 01/23/2023    5:26 AM 01/22/2023    9:35 AM  BMP  Glucose 70 - 99 mg/dL 952  841  324    401   BUN 8 - 23 mg/dL 17  24  20    22    Creatinine 0.61 - 1.24 mg/dL 0.27  2.53  6.64    4.03   Sodium 135 - 145 mmol/L 133  136  133    135   Potassium 3.5 - 5.1 mmol/L 4.1  4.4  4.3    4.2   Chloride 98 - 111 mmol/L 105  103  102    102   CO2 22 - 32 mmol/L 23  27  21    24    Calcium 8.9 - 10.3 mg/dL 9.4  9.2  9.3    9.4      Family Communication: Patient and wife understand and agree with above care plan.  Disposition: Status is: Observation The patient remains OBS appropriate and will d/c before 2 midnights.  Planned Discharge Destination: Home    Time spent: 43 minutes  Author: Marcelino Duster, MD 01/24/2023 4:42 PM  For on call review [email or web address].

## 2023-01-24 NOTE — Plan of Care (Signed)
  Problem: Education: Goal: Knowledge of cardiac device and self-care will improve Outcome: Progressing   Problem: Cardiac: Goal: Ability to achieve and maintain adequate cardiopulmonary perfusion will improve Outcome: Progressing   Problem: Education: Goal: Knowledge of disease or condition will improve Outcome: Progressing   Problem: Ischemic Stroke/TIA Tissue Perfusion: Goal: Complications of ischemic stroke/TIA will be minimized Outcome: Progressing   Problem: Coping: Goal: Will verbalize positive feelings about self Outcome: Progressing   Problem: Self-Care: Goal: Ability to participate in self-care as condition permits will improve Outcome: Progressing   Problem: Nutrition: Goal: Risk of aspiration will decrease Outcome: Progressing   Problem: Activity: Goal: Risk for activity intolerance will decrease Outcome: Progressing   Problem: Nutrition: Goal: Adequate nutrition will be maintained Outcome: Progressing   Problem: Coping: Goal: Level of anxiety will decrease Outcome: Progressing   Problem: Ischemic Stroke/TIA Tissue Perfusion: Goal: Complications of ischemic stroke/TIA will be minimized Outcome: Progressing   Problem: Coping: Goal: Will verbalize positive feelings about self Outcome: Progressing   Problem: Nutrition: Goal: Risk of aspiration will decrease Outcome: Progressing

## 2023-01-24 NOTE — Progress Notes (Addendum)
Patient Name: Erik Black Date of Encounter: 01/24/2023  Primary Cardiologist: Debbe Odea, Black Electrophysiologist: Lanier Prude, Black  Interval Summary   Viona Gilmore.  Had episode of L arm pain, chest pressure and headache that was very brief.  No other cardiac complaints.   Does not enjoy being in a joint room with neighbor.  Inpatient Medications    Scheduled Meds:  amLODipine  10 mg Oral Daily   aspirin EC  81 mg Oral Daily   atorvastatin  40 mg Oral QHS   brinzolamide  1 drop Both Eyes TID   And   brimonidine  1 drop Both Eyes TID   carvedilol  25 mg Oral BID   DULoxetine  60 mg Oral Daily   enoxaparin (LOVENOX) injection  60 mg Subcutaneous Q24H   insulin aspart  0-15 Units Subcutaneous TID WC   insulin aspart  0-5 Units Subcutaneous QHS   insulin glargine-yfgn  18 Units Subcutaneous QHS   latanoprost  1 drop Both Eyes QHS   mexiletine  200 mg Oral BID   multivitamin with minerals  1 tablet Oral Daily   OXcarbazepine  150 mg Oral BID   pantoprazole  40 mg Oral Daily   timolol  1 drop Both Eyes Daily   Continuous Infusions:  PRN Meds: acetaminophen **OR** acetaminophen, docusate sodium, magnesium hydroxide, ondansetron **OR** ondansetron (ZOFRAN) IV, traZODone   Vital Signs    Vitals:   01/23/23 1948 01/24/23 0359 01/24/23 0731 01/24/23 0753  BP: 134/85 135/78 (!) 144/76 131/66  Pulse: 70 64 62 62  Resp: 18  12   Temp: 97.7 F (36.5 C) 97.6 F (36.4 C) 97.7 F (36.5 C)   TempSrc: Oral Oral Oral   SpO2: 100% 100% 100% 100%  Weight:      Height:        Intake/Output Summary (Last 24 hours) at 01/24/2023 1103 Last data filed at 01/24/2023 0600 Gross per 24 hour  Intake 360 ml  Output --  Net 360 ml   Filed Weights   01/22/23 0929  Weight: 124.5 kg    Physical Exam    GEN- The patient is well appearing, alert and oriented x 3 today.   Lungs- Clear to ausculation bilaterally, normal work of breathing Cardiac- Irregularly irregular  rate and rhythm, no murmurs, rubs or gallops GI- soft, NT, ND, + BS Extremities- no clubbing or cyanosis. No edema  Telemetry    Tele only available beginning 0900 this AM. SR w frequent PVCs, trigeminal at times; rate 70s (personally reviewed)  Hospital Course    Erik Black is a 72 y.o. male with a history of nonobs CAD, HFrEF, NICM, PVCs, HTN, OSA, T2DM admitted for stroke-like symptoms with negative stroke work-up so concerned for tachy-arrhythmia  Assessment & Plan    #) Dizziness #) word-finding difficulty #) slurred speech #) arm numbness Brain MRI and CTA head/neck negative Patient symptoms do not appear to be related to tachyarrythmia and more likely a neurological process Tele order expired overnight, recommend continuing on tele for 24h and if no arrhythmia, ok to discharge   #) HFrEF #) NICM Persistently reduced LVEF on GDMT NYHA III Warm and dry on exam Planned for ICD placement with Dr. Lalla Brothers 7/29 for primary prevention   #) PVCs Very rare on telemetry Continue mexitil 200mg  daily Continue coreg 25mg  BID    For questions or updates, please contact [facility] Please consult [email or web address] for contact info under Cardiology/STEMI.  Signed, Sherie Don, NP  01/24/2023,  11:03 AM   I have seen the patient via virtual consultation today.  The patient expressed their consent to participate in today's virtual consult. The patient was located at [facility] while I was located at [facility].  I have examined the patient and reviewed the above assessment and plan.     -----------------   I have seen and examined this patient with Sherie Don.  Agree with above, note added to reflect my findings.  No acute complaints currently.  Had brief arm and chest pain earlier today.  GEN: Well nourished, well developed, in no acute distress  HEENT: normal  Neck: no JVD, carotid bruits, or masses Cardiac: RRR; no murmurs,  rubs, or gallops,no edema  Respiratory:  clear to auscultation bilaterally, normal work of breathing GI: soft, nontender, nondistended, + BS MS: no deformity or atrophy  Skin: warm and dry Neuro:  Strength and sensation are intact Psych: euthymic mood, full affect   Dizziness/word finding difficulty/arm numbness: Unclear to the cause.  He had a brain MRI that showed no evidence of CVA.  Plan per primary team. Chronic systolic heart failure: Due to nonischemic cardiomyopathy.  On optimal medical therapy.  Has plans for ICD implant on Monday.  Did have palpitations prior to his event.  Would keep on telemetry for the next 24 hours.  If no arrhythmias, okay for discharge.  Erik Black 01/24/2023 5:22 PM   Caregility was used to complete today's consult.     Total time of encounter: 30 minutes total time of encounter, including face-to-face patient care, coordination of care and counseling regarding high complexity medical decision making re: palpitations.   N6295 - ER or Initial Inpatient 30 min M8413 - ER or Initial Inpatient 50 min G0427 - ER or Initial Inpatient 70 min  G0406 - Follow up inpatient consult limited 15 min G0407 - Follow up inpatient consult intermediate 25 min G0408 - Follow up inpatient consult complex 35 min  -GT modifier - via interactive audio and video telecommunication systems     Fransheska Willingham Jorja Loa, Black 01/24/2023 5:22 PM

## 2023-01-24 NOTE — Progress Notes (Signed)
ADVANCED HEART FAILURE CONSULT NOTE  Referring Physician: No ref. provider found  Primary Care: [facility] Primary Cardiologist: Debbe Odea, MD HF: Dr. Gala Romney  HPI: Erik Black is a 72 y.o. male with nonobstructive CAD, HFrEF secondary to nonischemic cardiomyopathy, type 2 diabetes, obstructive sleep apnea and morbid obesity that presented to [facility] with difficulty speaking and ambulating associated with dizziness, generalized weakness and numbness.  Around that same time he reports having substernal chest pressure and palpitations.  He was brought to the emergency department by his wife however symptoms had resolved by that time.  In the emergency department workup was mostly unremarkable.  EKG without ischemic changes, serum creatinine 2.1, high-sensitivity troponin 11 and brain MRI with no acute processes.  His cardiac history dates back to September 2023 when he was diagnosed with heart failure with an EF of 30 to 35%.  Left heart cath in 11/23 with 60% LAD, 55% left circumflex and 30% RCA lesion with a Fick cardiac index of 2.3.  He had a follow-up cardiac MRI with an EF of 25% and mid wall LGE consistent with nonischemic cardiomyopathy and otherwise no infiltrative disease.  Zio patch in November with 13.6% PVC burden.  Since that time he followed regularly with Dr. Sampson Goon in heart failure clinic.  He has 4 brothers with heart failure, 1 with an ICD.  Interval hx:  - Telemetry this AM with bigeminy/trigeminy  - Reports episode of sharp left arm pain & headache.   PHYSICAL EXAM: Vitals:   01/24/23 0731 01/24/23 0753  BP: (!) 144/76 131/66  Pulse: 62 62  Resp: 12   Temp: 97.7 F (36.5 C)   SpO2: 100% 100%   GENERAL: Well nourished, well developed, and in no apparent distress at rest.  HEENT: Negative for arcus senilis or xanthelasma. There is no scleral icterus.  The mucous membranes are pink and moist.   NECK: Supple, No masses. Normal carotid upstrokes  without bruits. No masses or thyromegaly.    CHEST: There are no chest wall deformities. There is no chest wall tenderness. Respirations are unlabored.  Lungs-CTA bilaterally CARDIAC:  JVP: 8-9cm         Normal S1, S2  Normal rate with regular rhythm. No murmurs, rubs or gallops.  Pulses are 2+ and symmetrical in upper and lower extremities.  No edema.  ABDOMEN: Soft, non-tender, non-distended. There are no masses or hepatomegaly. There are normal bowel sounds.  EXTREMITIES: Warm and well perfused with no cyanosis, clubbing.  LYMPHATIC: No axillary or supraclavicular lymphadenopathy.  NEUROLOGIC: Patient is oriented x3 with no focal or lateralizing neurologic deficits.  PSYCH: Patients affect is appropriate, there is no evidence of anxiety or depression.  SKIN: Warm and dry; no lesions or wounds.   DATA REVIEW  ECG: 01/23/23: NSR  As per my personal interpretation  ECHO: 01/19/2023: LVEF 20 to 25%.  Unchanged from echo in March 2024.  ASSESSMENT & PLAN:  Heart failure with reduced EF -Cath in 11/23 with nonobstructive CAD.  Follow-up cardiac MRI with LVEF of 25% with mid wall LGE strip consistent with nonischemic cardiomyopathy.  He had a Ziopatch in November 23 with 13.7% PVC burden that improved to 2% on repeat Zio patch.  His cardiomyopathy is believed to be familial versus PVC. -On my exam today, he is euvolemic and otherwise appears very well compensated from a heart failure standpoint.  His presenting symptoms are concerning for TIA versus tachyarrhythmia. -At this time would restart home medications including amlodipine  10 mg, Coreg 25 mg twice daily, mexiletine 200 mg twice daily. -Repeat BMP pending.  -Plan to restart Entresto 24/26mg  BID today. Will be stable for D/C from HFrEF standpoint pending labs.   2. AKI on CKD - baseline sCr of 1.5; on arrival sCr was up to 2.1. Now improved to 1.7 yesterday. Repeat pending. Euvolemic on exam.   3. Stroke-like symptoms  - MR brain  with chronic small vessel disease; otherwise, no acute findings.  - CTA head/neck unremarkable - EKG NSR - Telemetry this AM with bigeminy/trigeminy. EP following with plan for ICD placement on 01/28/23.    Siriyah Ambrosius Advanced Heart Failure Mechanical Circulatory Support

## 2023-01-25 ENCOUNTER — Telehealth: Payer: Self-pay | Admitting: Cardiology

## 2023-01-25 DIAGNOSIS — R000 Tachycardia, unspecified: Secondary | ICD-10-CM

## 2023-01-25 DIAGNOSIS — R0789 Other chest pain: Secondary | ICD-10-CM | POA: Diagnosis not present

## 2023-01-25 DIAGNOSIS — I5023 Acute on chronic systolic (congestive) heart failure: Secondary | ICD-10-CM | POA: Diagnosis not present

## 2023-01-25 DIAGNOSIS — E785 Hyperlipidemia, unspecified: Secondary | ICD-10-CM | POA: Diagnosis not present

## 2023-01-25 DIAGNOSIS — I639 Cerebral infarction, unspecified: Secondary | ICD-10-CM | POA: Diagnosis not present

## 2023-01-25 DIAGNOSIS — R29818 Other symptoms and signs involving the nervous system: Secondary | ICD-10-CM | POA: Diagnosis not present

## 2023-01-25 LAB — BASIC METABOLIC PANEL
Anion gap: 9 (ref 5–15)
BUN: 18 mg/dL (ref 8–23)
CO2: 24 mmol/L (ref 22–32)
Calcium: 9.7 mg/dL (ref 8.9–10.3)
Chloride: 104 mmol/L (ref 98–111)
Creatinine, Ser: 1.24 mg/dL (ref 0.61–1.24)
GFR, Estimated: >60 mL/min (ref >60)
Glucose, Bld: 148 mg/dL — ABNORMAL HIGH (ref 70–99)
Potassium: 4.2 mmol/L (ref 3.5–5.1)
Sodium: 137 mmol/L (ref 135–145)

## 2023-01-25 LAB — GLUCOSE, CAPILLARY
Glucose-Capillary: 127 mg/dL — ABNORMAL HIGH (ref 70–99)
Glucose-Capillary: 175 mg/dL — ABNORMAL HIGH (ref 70–99)

## 2023-01-25 LAB — MAGNESIUM: Magnesium: 2.4 mg/dL (ref 1.7–2.4)

## 2023-01-25 MED ORDER — INSULIN GLARGINE-YFGN 100 UNIT/ML ~~LOC~~ SOLN: 15.0000 [IU] | Freq: Every day | SUBCUTANEOUS | 11 refills | Status: DC

## 2023-01-25 NOTE — Telephone Encounter (Signed)
Spoke with pt's wife,DPR.  Pt's wife states pt was discharged from hospital today and is supposed to start Semglee a new insuling tonight.  Instructions advise pt to take 15U nightly at bedtime.  Pt's wife advised on Sunday night 01/27/2023 pt will only take 1/2 of his regular dose (7.5U) of Semglee.  Reviewed device instruction letter with pt's wife for full medication review.  Pt's wife verbalizes understanding and thanked Charity fundraiser for the call.

## 2023-01-25 NOTE — Progress Notes (Signed)
  Patient Name: Erik Black Date of Encounter: 01/25/2023  Primary Cardiologist: Debbe Odea, MD Electrophysiologist: Lanier Prude, MD  Interval Summary   Erik Black.  No complaints this AM, slept well last night  Inpatient Medications    Scheduled Meds:  amLODipine  10 mg Oral Daily   aspirin EC  81 mg Oral Daily   atorvastatin  40 mg Oral QHS   brinzolamide  1 drop Both Eyes TID   And   brimonidine  1 drop Both Eyes TID   carvedilol  25 mg Oral BID   DULoxetine  60 mg Oral Daily   enoxaparin (LOVENOX) injection  60 mg Subcutaneous Q24H   insulin aspart  0-15 Units Subcutaneous TID WC   insulin aspart  0-5 Units Subcutaneous QHS   insulin glargine-yfgn  18 Units Subcutaneous QHS   latanoprost  1 drop Both Eyes QHS   mexiletine  200 mg Oral BID   multivitamin with minerals  1 tablet Oral Daily   OXcarbazepine  150 mg Oral BID   pantoprazole  40 mg Oral Daily   sacubitril-valsartan  1 tablet Oral BID   timolol  1 drop Both Eyes Daily   Continuous Infusions:  PRN Meds: acetaminophen **OR** acetaminophen, docusate sodium, magnesium hydroxide, ondansetron **OR** ondansetron (ZOFRAN) IV, traZODone   Vital Signs    Vitals:   01/24/23 1958 01/24/23 2357 01/25/23 0519 01/25/23 0740  BP: 135/80 133/83 (!) 146/81 139/77  Pulse: 67 65 62 (!) 58  Resp: 20 19 20 16   Temp: 98.6 F (37 C) 98 F (36.7 C) 97.9 F (36.6 C) 98.1 F (36.7 C)  TempSrc: Oral Oral Oral Oral  SpO2: 100% 100% 100% 99%  Weight:      Height:        Intake/Output Summary (Last 24 hours) at 01/25/2023 0845 Last data filed at 01/24/2023 1132 Gross per 24 hour  Intake --  Output 1 ml  Net -1 ml   Filed Weights   01/22/23 0929  Weight: 124.5 kg    Physical Exam    GEN- The patient is well appearing, alert and oriented x 3 today.   Lungs- Clear to ausculation bilaterally, normal work of breathing Cardiac- Regular rate and rhythm, no murmurs, rubs or gallops GI- soft, NT, ND, +  BS Extremities- no clubbing or cyanosis. No edema  Telemetry    NSR with PVCs, less freq than yesterday. (personally reviewed)  Hospital Course    Erik Black is a 72 y.o. male with a history of nonobs CAD, HFrEF, NICM, PVCs, HTN, OSA, T2DM admitted for stroke-like symptoms with negative stroke work-up so concerned for tachy-arrhythmia  Assessment & Plan    #) Dizziness #) word-finding difficulty #) slurred speech #) arm numbness Brain MRI and CTA head/neck negative Patient symptoms do not appear to be related to tachyarrythmia and more likely a neurological process Tele without arrhythmia   #) HFrEF #) NICM Persistently reduced LVEF on GDMT NYHA III Warm and dry on exam Planned for ICD placement with Dr. Lalla Brothers 7/29 for primary prevention   #) PVCs Quiescent on telemetry Continue mexitil 200mg  daily Continue coreg 25mg  BID   OK to discharge from EP perspective Patient will present for ICD placement at [facility] 7/29   For questions or updates, please contact [facility] Please consult [email or web address] for contact info under Cardiology/STEMI.  Signed, Sherie Don, NP  01/25/2023, 8:45 AM

## 2023-01-25 NOTE — Telephone Encounter (Signed)
   Pt c/o medication issue:  1. Name of Medication: insulin glargine-yfgn (SEMGLEE) 100 UNIT/ML injection   2. How are you currently taking this medication (dosage and times per day)? As written   3. Are you having a reaction (difficulty breathing--STAT)? No   4. What is your medication issue? Pt's wife would like to know if pt need to take his insulin before or after his ICD implant procedure

## 2023-01-25 NOTE — Progress Notes (Signed)
ADVANCED HEART FAILURE CONSULT NOTE  Referring Physician: No ref. provider found  Primary Care: [facility] Primary Cardiologist: Debbe Odea, MD HF: Dr. Gala Romney  HPI: Erik Black is a 72 y.o. male with nonobstructive CAD, HFrEF secondary to nonischemic cardiomyopathy, type 2 diabetes, obstructive sleep apnea and morbid obesity that presented to [facility] with difficulty speaking and ambulating associated with dizziness, generalized weakness and numbness.  Around that same time he reports having substernal chest pressure and palpitations.  He was brought to the emergency department by his wife however symptoms had resolved by that time.  In the emergency department workup was mostly unremarkable.  EKG without ischemic changes, serum creatinine 2.1, high-sensitivity troponin 11 and brain MRI with no acute processes.  His cardiac history dates back to September 2023 when he was diagnosed with heart failure with an EF of 30 to 35%.  Left heart cath in 11/23 with 60% LAD, 55% left circumflex and 30% RCA lesion with a Fick cardiac index of 2.3.  He had a follow-up cardiac MRI with an EF of 25% and mid wall LGE consistent with nonischemic cardiomyopathy and otherwise no infiltrative disease.  Zio patch in November with 13.6% PVC burden.  Since that time he followed regularly with Dr. Sampson Goon in heart failure clinic.  He has 4 brothers with heart failure, 1 with an ICD.  Interval hx:  - Telemetry with minimal PVCs; no longer having bigeminy - Feels back to his baseline this AM.   PHYSICAL EXAM: Vitals:   01/25/23 0519 01/25/23 0740  BP: (!) 146/81 139/77  Pulse: 62 (!) 58  Resp: 20 16  Temp: 97.9 F (36.6 C) 98.1 F (36.7 C)  SpO2: 100% 99%   GENERAL: Well nourished, well developed, and in no apparent distress at rest.  HEENT: Negative for arcus senilis or xanthelasma. There is no scleral icterus.  The mucous membranes are pink and moist.   NECK: Supple, No masses. Normal  carotid upstrokes without bruits. No masses or thyromegaly.    CHEST: There are no chest wall deformities. There is no chest wall tenderness. Respirations are unlabored.  Lungs-CTA bilaterally CARDIAC:  JVP: 7cm         Normal S1, S2  Normal rate with regular rhythm. No murmurs, rubs or gallops.  Pulses are 2+ and symmetrical in upper and lower extremities.  No edema.  ABDOMEN: Soft, non-tender, non-distended. There are no masses or hepatomegaly. There are normal bowel sounds.  EXTREMITIES: Warm and well perfused with no cyanosis, clubbing.  LYMPHATIC: No axillary or supraclavicular lymphadenopathy.  NEUROLOGIC: Patient is oriented x3 with no focal or lateralizing neurologic deficits.  PSYCH: Patients affect is appropriate, there is no evidence of anxiety or depression.  SKIN: Warm and dry; no lesions or wounds.   DATA REVIEW  ECG: 01/23/23: NSR  As per my personal interpretation  ECHO: 01/19/2023: LVEF 20 to 25%.  Unchanged from echo in March 2024.  ASSESSMENT & PLAN:  Heart failure with reduced EF -Cath in 11/23 with nonobstructive CAD.  Follow-up cardiac MRI with LVEF of 25% with mid wall LGE strip consistent with nonischemic cardiomyopathy.  He had a Ziopatch in November 23 with 13.7% PVC burden that improved to 2% on repeat Zio patch.  His cardiomyopathy is believed to be familial versus PVC. -On my exam today, he is euvolemic and otherwise appears very well compensated from a heart failure standpoint.  His presenting symptoms are concerning for TIA versus tachyarrhythmia. -At this time would restart  home medications including amlodipine 10 mg, Coreg 25 mg twice daily, mexiletine 200 mg twice daily. -Home dose Entresto restarted yesterday.  -Euvolemic on exam today.  -Stable for discharge from heart failure standpoint with plan for ICD placement on 01/28/24.  -Restart torsemide 20mg  daily at discharge.   2. AKI on CKD - baseline sCr of 1.5; on arrival sCr was up to 2.1. Now improved  to 1.7 yesterday. Repeat pending. Euvolemic on exam.   3. Stroke-like symptoms  - MR brain with chronic small vessel disease; otherwise, no acute findings.  - CTA head/neck unremarkable - EKG NSR - EP following with plan for ICD placement on 01/28/23.    Eusevio Schriver Advanced Heart Failure Mechanical Circulatory Support

## 2023-01-25 NOTE — Progress Notes (Signed)
Discharge instructions provided to both patient and wife by Marchelle Folks, charge RN

## 2023-01-25 NOTE — Discharge Summary (Incomplete)
Physician Discharge Summary   Patient: Erik Black MRN: [id number] DOB: [birth year]  Admit date:     01/22/2023  Discharge date: 01/25/23  Discharge Physician: Marcelino Duster   PCP: [facility]   Recommendations at discharge:  {Tip this will not be part of the note when signed- Example include specific recommendations for outpatient follow-up, pending tests to follow-up on. (Optional):26781}  PCP follow up in 1week EP follow up 7/29 for ICD placement.  Discharge Diagnoses: Principal Problem:   Acute CVA (cerebrovascular accident) [facility]) Active Problems:   Chest pain   Acute on chronic systolic CHF (congestive heart failure) (HCC)   Dyslipidemia   Essential hypertension   Type 2 diabetes mellitus with peripheral neuropathy (HCC)  Resolved Problems:   * No resolved hospital problems. *  Hospital Course: Erik Black is a 72 y.o. male with PMH as above was in his usual state of health until yesterday. He was on a zoom prayer call and noticed that he was having word finding difficulties and slurring of speech. He thought he needed some air, and asked his wife to come into the room. When he stood up, he was very dizzy and noticed his heart was beating hard in chest. He thought he needed to lay down, and so went into bedroom to rest. At this point, he continued to feel unwell and so he and hs wife decided to proceed to ER for further evaluation. During walk to the car, he felt as though his wife was pulling him but now realizes she was holding him upright as he was unable to walk unassisted. While en route, noted that his speech no longer sounded slurred, and balance was improved. Neurology workup has included MRI brain and CTA head/neck, both of which have not revealed intracranial process to explain the patient's symptoms. He is planned for ICD placement next week with Dr. Lalla Brothers for primary prevention for his persistent HFrEF.   Assessment and Plan:  Acute CVA  (cerebrovascular accident) (HCC) Continue observation under medically monitored bed.   Follow neuro checks q.4 hours for 24 hours.   Continue aspirin, statin Brain MRI, head and neck CTA and 2D unremarkable. Echo with bubble study showed EF 25-30% global hypokinesis, no interatrial shunt. Neurology feel the symptoms could be due to cardiac arrhythmias.   Chest pain, tachyarrhythmias Troponins unremarkable.  Patient had another episode of sharp chest pain with radiation to left arm. EKG, trop unremarkable. Continue aspirin, sublingual nitroglycerin, statin. Telemetry got discontinued overnight. Renewed telemetry order for next 24hrs monitoring. EP physician consulted advised to continue to observe for another day. Patient has plan for ICD placement 729.   Acute on chronic systolic CHF (congestive heart failure) (HCC) Echo with bubble study showed EF 25-30% global hypokinesis, no interatrial shunt. He got IV Lasix one-time dose. Continue Coreg, mexiletine Heart failure team advised to restart Coreg, norvasc, Entresto. BMP reviewed.   Dyslipidemia Continue statin therapy.   Type 2 diabetes mellitus with peripheral neuropathy (HCC) A1c 10.5, discussed with diabetes educator.  Continue Semglee 18 units, Accu-Cheks, sliding scale insulin ACHS.   Essential hypertension Patient will be continued on home dose Coreg, Norvasc, therapy.   DVT prophylaxis Lovenox. Continue nursing supportive care. Fall and aspiration precautions. CODE STATUS full code.    {Tip this will not be part of the note when signed Body mass index is 37.23 kg/m. , ,  (Optional):26781}  {(NOTE) Pain control PDMP Statment (Optional):26782} Consultants: Neurology, Cardiology, Heart failure, EP Procedures performed: none  Disposition:  Home Diet recommendation:  Discharge Diet Orders (From admission, onward)     Start     Ordered   01/25/23 0000  Diet - low sodium heart healthy        01/25/23 1329            Cardiac diet DISCHARGE MEDICATION: Allergies as of 01/25/2023       Reactions   Empagliflozin Other (See Comments)   Yeast infection Other Reaction(s): Bleeding skin   Methadone    Oxycodone Itching   Hands started peeling and "could not swallow"   Lisinopril Cough        Medication List     STOP taking these medications    Klor-Con M20 20 MEQ tablet Generic drug: potassium chloride SA       TAKE these medications    amLODipine 10 MG tablet Commonly known as: NORVASC Take 10 mg by mouth daily.   aspirin EC 81 MG tablet Take 1 tablet (81 mg total) by mouth 2 (two) times daily.   atorvastatin 40 MG tablet Commonly known as: LIPITOR Take 1 tablet (40 mg total) by mouth at bedtime.   Breztri Aerosphere 160-9-4.8 MCG/ACT Aero Generic drug: Budeson-Glycopyrrol-Formoterol Inhale 1 puff into the lungs 2 (two) times daily.   Brinzolamide-Brimonidine 1-0.2 % Susp Apply 1 drop to eye 3 (three) times daily.   carvedilol 25 MG tablet Commonly known as: COREG TAKE 1 TABLET BY MOUTH TWICE A DAY   docusate sodium 100 MG capsule Commonly known as: COLACE Take 100 mg by mouth daily as needed.   DULoxetine 60 MG capsule Commonly known as: CYMBALTA Take 60 mg by mouth daily.   insulin glargine-yfgn 100 UNIT/ML injection Commonly known as: SEMGLEE Inject 0.15 mLs (15 Units total) into the skin at bedtime.   latanoprost 0.005 % ophthalmic solution Commonly known as: XALATAN Place 1 drop into both eyes at bedtime.   mexiletine 200 MG capsule Commonly known as: MEXITIL TAKE 1 CAPSULE BY MOUTH TWICE A DAY   multivitamin with minerals tablet Take 1 tablet by mouth daily.   Omega-3 1000 MG Caps Take 2,000 mg by mouth 3 (three) times daily after meals.   omeprazole 20 MG capsule Commonly known as: PRILOSEC Take 20 mg by mouth 2 (two) times daily.   OXcarbazepine 150 MG tablet Commonly known as: TRILEPTAL Take 150 mg by mouth 2 (two) times daily.    sacubitril-valsartan 97-103 MG Commonly known as: ENTRESTO Take 1 tablet by mouth 2 (two) times daily.   Semaglutide(0.25 or 0.5MG /DOS) 2 MG/3ML Sopn Inject 0.5 mg into the skin once a week.   spironolactone 25 MG tablet Commonly known as: ALDACTONE TAKE 1 TABLET (25 MG TOTAL) BY MOUTH DAILY.   timolol 0.5 % ophthalmic gel-forming Commonly known as: TIMOPTIC-XR Place 1 drop into both eyes daily.   torsemide 20 MG tablet Commonly known as: DEMADEX TAKE 2 TABLETS (40 MG TOTAL) BY MOUTH DAILY.   Vitamin D 50 MCG (2000 UT) tablet Take 4,000 Units by mouth daily.        Discharge Exam: Filed Weights   01/22/23 0929  Weight: 124.5 kg   ***  Condition at discharge: stable  The results of significant diagnostics from this hospitalization (including imaging, microbiology, ancillary and laboratory) are listed below for reference.   Imaging Studies: ECHOCARDIOGRAM LIMITED  Result Date: 01/23/2023    ECHOCARDIOGRAM LIMITED REPORT   Patient Name:   CONLAN CLANIN Date of Exam: 01/23/2023 Medical Rec #:  [id number]  Height:       72.0 in Accession #:    [id number]         Weight:       274.5 lb Date of Birth:  July 25, 1950          BSA:          2.437 m Patient Age:    71 years           BP:           120/78 mmHg Patient Gender: M                  HR:           66 bpm. [facility] Procedure: Limited Echo, Cardiac Doppler and Color Doppler Indications:     Chest pain R07.9  History:         Patient has prior history of Echocardiogram examinations, most                  recent 09/11/2022. CHF; Risk Factors:Hypertension and Diabetes.  Sonographer:     Cristela Blue Referring Phys:  [id number] CADENCE H FURTH Diagnosing Phys: Julien Nordmann MD  Sonographer Comments: Technically challenging study due to limited acoustic windows and no apical window. IMPRESSIONS  1. Left ventricular ejection fraction, by estimation, is 25 to 30%. Left ventricular ejection fraction by PLAX is 29  %. The left ventricle has severely decreased function. The left ventricle demonstrates global hypokinesis. The left ventricular internal  cavity size was mildly dilated.  2. Right ventricular systolic function is normal. The right ventricular size is normal. Tricuspid regurgitation signal is inadequate for assessing PA pressure.  3. The mitral valve is normal in structure. No evidence of mitral valve regurgitation. No evidence of mitral stenosis.  4. The aortic valve is normal in structure. Aortic valve regurgitation is not visualized. No aortic stenosis is present.  5. The inferior vena cava is normal in size with greater than 50% respiratory variability, suggesting right atrial pressure of 3 mmHg. FINDINGS  Left Ventricle: Left ventricular ejection fraction, by estimation, is 25 to 30%. Left ventricular ejection fraction by PLAX is 29 %. The left ventricle has severely decreased function. The left ventricle demonstrates global hypokinesis. The left ventricular internal cavity size was mildly dilated. There is no left ventricular hypertrophy. Right Ventricle: The right ventricular size is normal. No increase in right ventricular wall thickness. Right ventricular systolic function is normal. Tricuspid regurgitation signal is inadequate for assessing PA pressure. Left Atrium: Left atrial size was normal in size. Right Atrium: Right atrial size was normal in size. Pericardium: There is no evidence of pericardial effusion. Mitral Valve: The mitral valve is normal in structure. No evidence of mitral valve stenosis. Tricuspid Valve: The tricuspid valve is normal in structure. Tricuspid valve regurgitation is not demonstrated. No evidence of tricuspid stenosis. Aortic Valve: The aortic valve is normal in structure. Aortic valve regurgitation is not visualized. No aortic stenosis is present. Pulmonic Valve: The pulmonic valve was normal in structure. Pulmonic valve regurgitation is not visualized. No evidence of pulmonic  stenosis. Aorta: The aortic root is normal in size and structure. Venous: The inferior vena cava is normal in size with greater than 50% respiratory variability, suggesting right atrial pressure of 3 mmHg. IAS/Shunts: No atrial level shunt detected by color flow Doppler. Additional Comments: Spectral Doppler performed. Color Doppler performed.  LEFT VENTRICLE PLAX 2D LV EF:         Left  ventricular ejection fraction by PLAX is 29 %. LVIDd:         5.80 cm LVIDs:         5.00 cm LV PW:         1.10 cm LV IVS:        1.30 cm  LEFT ATRIUM         Index LA diam:    3.50 cm 1.44 cm/m Julien Nordmann MD Electronically signed by Julien Nordmann MD Signature Date/Time: 01/23/2023/1:53:14 PM    Final    MR BRAIN WO CONTRAST  Result Date: 01/22/2023 CLINICAL DATA:  Neuro deficit, acute, stroke suspected. Altered mental status. EXAM: MRI HEAD WITHOUT CONTRAST TECHNIQUE: Multiplanar, multiecho pulse sequences of the brain and surrounding structures were obtained without intravenous contrast. COMPARISON:  Head CT and CTA head/neck 01/22/2023. FINDINGS: Brain: No acute infarct or hemorrhage. Moderate chronic small-vessel disease. No mass or midline shift. No hydrocephalus or extra-axial collection. No abnormal susceptibility. Vascular: Normal flow voids. Skull and upper cervical spine: Normal marrow signal. Sinuses/Orbits: Mucous retention cysts in the right maxillary sinus. Orbits are unremarkable. Other: None. IMPRESSION: 1. No acute intracranial process. 2. Moderate chronic small-vessel disease. Electronically Signed   By: Orvan Falconer M.D.   On: 01/22/2023 17:52   CT Angio Chest PE W/Cm &/Or Wo Cm  Result Date: 01/22/2023 CLINICAL DATA:  Altered mental status, slurred speech, chest pain. EXAM: CT ANGIOGRAPHY CHEST WITH CONTRAST TECHNIQUE: Multidetector CT imaging of the chest was performed using the standard protocol during bolus administration of intravenous contrast. Multiplanar CT image reconstructions and MIPs  were obtained to evaluate the vascular anatomy. RADIATION DOSE REDUCTION: This exam was performed according to the departmental dose-optimization program which includes automated exposure control, adjustment of the mA and/or kV according to patient size and/or use of iterative reconstruction technique. CONTRAST:  75mL OMNIPAQUE IOHEXOL 350 MG/ML SOLN COMPARISON:  Chest CT dated 06/01/2022. FINDINGS: Cardiovascular: Preferential opacification of the thoracic aorta limits the sensitivity of the exam for evaluation of pulmonary embolism. Given this limitation, no large pulmonary embolism is identified. Vascular calcifications are seen in the coronary arteries and aortic arch. The heart is mildly enlarged. No pericardial effusion. Mediastinum/Nodes: No enlarged mediastinal, hilar, or axillary lymph nodes. A 1.7 cm hypoattenuating nodule in the left thyroid appears similar to 06/01/2022. The trachea and esophagus demonstrate no significant findings. Lungs/Pleura: There are mild scattered bilateral ground-glass opacities and mild bilateral dependent atelectasis. A large bulla in the right upper lobe with nearby atelectasis/scarring of the right upper and right middle lobes appear similar to prior exam. The previously described ground-glass nodules in the left upper lobe have since resolved, consistent with a infectious/inflammatory process. No pleural effusion or pneumothorax. Upper Abdomen: No acute abnormality. Musculoskeletal: Degenerative changes are seen in the spine. Review of the MIP images confirms the above findings. IMPRESSION: 1. Limited sensitivity of the exam for pulmonary embolism due to bolus timing. Given this limitation, no large pulmonary embolism. 2. Mild scattered bilateral ground-glass opacities may reflect atelectasis or mild pulmonary edema. Aortic Atherosclerosis (ICD10-I70.0). Electronically Signed   By: Romona Curls M.D.   On: 01/22/2023 13:02   CT ANGIO HEAD NECK W WO CM (CODE  STROKE)  Result Date: 01/22/2023 CLINICAL DATA:  Neuro deficit, acute, stroke suspected EXAM: CT ANGIOGRAPHY HEAD AND NECK WITH AND WITHOUT CONTRAST TECHNIQUE: Multidetector CT imaging of the head and neck was performed using the standard protocol during bolus administration of intravenous contrast. Multiplanar CT image reconstructions and MIPs were  obtained to evaluate the vascular anatomy. Carotid stenosis measurements (when applicable) are obtained utilizing NASCET criteria, using the distal internal carotid diameter as the denominator. RADIATION DOSE REDUCTION: This exam was performed according to the departmental dose-optimization program which includes automated exposure control, adjustment of the mA and/or kV according to patient size and/or use of iterative reconstruction technique. CONTRAST:  75mL OMNIPAQUE IOHEXOL 350 MG/ML SOLN COMPARISON:  None Available. FINDINGS: CTA NECK FINDINGS Aortic arch: Great vessel origins are patent without significant stenosis. Right carotid system: No evidence of dissection, stenosis (50% or greater), or occlusion. Left carotid system: No evidence of dissection, stenosis (50% or greater), or occlusion. Vertebral arteries: Left dominant. No evidence of dissection, stenosis (50% or greater), or occlusion. Skeleton: No acute abnormality on limited assessment. Bridging osteophytes. Other neck: Subcentimeter thyroid nodules not require further imaging follow-up (ref: J Am Coll Radiol. 2015 Feb;12(2): 143-50). Upper chest: Evaluated on same day CTA chest. Review of the MIP images confirms the above findings CTA HEAD FINDINGS Anterior circulation: Bilateral intracranial ICAs, MCAs, and ACAs are patent without proximal hemodynamically significant stenosis. No aneurysm. Posterior circulation: Bilateral intradural vertebral arteries, basilar artery and bilateral posterior cerebral arteries are patent without proximal hemodynamically significant stenosis. Venous sinuses: As permitted  by contrast timing, patent. Review of the MIP images confirms the above findings IMPRESSION: No emergent large vessel occlusion or proximal hemodynamically significant stenosis. Electronically Signed   By: Feliberto Harts M.D.   On: 01/22/2023 12:07   CT HEAD WO CONTRAST ( )  Result Date: 01/22/2023 CLINICAL DATA:  Altered mental status, nontraumatic (Ped 0-17y) EXAM: CT HEAD WITHOUT CONTRAST TECHNIQUE: Contiguous axial images were obtained from the base of the skull through the vertex without intravenous contrast. RADIATION DOSE REDUCTION: This exam was performed according to the departmental dose-optimization program which includes automated exposure control, adjustment of the mA and/or kV according to patient size and/or use of iterative reconstruction technique. COMPARISON:  None Available. FINDINGS: Brain: No evidence of acute infarction, hemorrhage, hydrocephalus, extra-axial collection or mass lesion/mass effect. Sequela of mild chronic microvascular ischemic change. Vascular: No hyperdense vessel or unexpected calcification. Skull: Normal. Negative for fracture or focal lesion. Sinuses/Orbits: No middle ear or mastoid effusion. Paranasal sinuses are notable for polypoid mucosal thickening in the right maxillary sinus. Bilateral lens replacement. Orbits are otherwise unremarkable. Other: None. IMPRESSION: No acute intracranial abnormality. Electronically Signed   By: Lorenza Cambridge M.D.   On: 01/22/2023 10:27   DG Chest 2 View  Result Date: 01/22/2023 CLINICAL DATA:  Chest pain EXAM: CHEST - 2 VIEW COMPARISON:  06/01/2022 FINDINGS: Eventration of the diaphragm. There is some linear opacity along the lung bases likely scar or atelectasis. No consolidation, pneumothorax or edema. Normal cardiopericardial silhouette. Calcified aorta. Degenerative changes of the spine with bridging syndesmophytes diffusely. Films are under penetrated. IMPRESSION: Under penetrated radiographs. Basilar areas of scarring  and atelectatic change. Electronically Signed   By: Karen Kays M.D.   On: 01/22/2023 10:22    Microbiology: Results for orders placed or performed during the hospital encounter of 11/19/19  SARS CORONAVIRUS 2 (TAT 6-24 HRS) Nasopharyngeal Nasopharyngeal Swab     Status: None   Collection Time: 11/19/19 12:38 PM   Specimen: Nasopharyngeal Swab  Result Value Ref Range Status   SARS Coronavirus 2 NEGATIVE NEGATIVE Final    Comment: (NOTE) SARS-CoV-2 target nucleic acids are NOT DETECTED. The SARS-CoV-2 RNA is generally detectable in upper and lower respiratory specimens during the acute phase of infection. Negative results do not  preclude SARS-CoV-2 infection, do not rule out co-infections with other pathogens, and should not be used as the sole basis for treatment or other patient management decisions. Negative results must be combined with clinical observations, patient history, and epidemiological information. The expected result is Negative. Fact Sheet for Patients: HairSlick.no Fact Sheet for Healthcare Providers: quierodirigir.com This test is not yet approved or cleared by the [place] FDA and  has been authorized for detection and/or diagnosis of SARS-CoV-2 by FDA under an Emergency Use Authorization (EUA). This EUA will remain  in effect (meaning this test can be used) for the duration of the COVID-19 declaration under Section 56 4(b)(1) of the Act, 21 U.S.C. section 360bbb-3(b)(1), unless the authorization is terminated or revoked sooner. Performed at [facility], [address] [place], [place] [address]     Labs: CBC: Recent Labs  Lab 01/22/23 0935 01/23/23 0526  WBC 9.5  9.4 5.8  NEUTROABS 7.0  --   HGB 14.7  14.8 13.9  HCT 45.4  45.3 42.4  MCV 89.0  89.3 89.8  PLT 157  162 144*   Basic Metabolic Panel: Recent Labs  Lab 01/22/23 0935 01/23/23 0526 01/24/23 1444 01/25/23 0927  NA  135  133* 136 133* 137  K 4.2  4.3 4.4 4.1 4.2  CL 102  102 103 105 104  CO2 24  21* 27 23 24   GLUCOSE 273*  287* 197* 237* 148*  BUN 22  20 24* 17 18  CREATININE 2.12*  1.93* 1.70* 1.33* 1.24  CALCIUM 9.4  9.3 9.2 9.4 9.7  MG  --   --  2.5* 2.4   Liver Function Tests: Recent Labs  Lab 01/22/23 0935  AST 20  ALT 15  ALKPHOS 120  BILITOT 0.8  PROT 7.2  ALBUMIN 4.1   CBG: Recent Labs  Lab 01/24/23 1215 01/24/23 1708 01/24/23 1954 01/25/23 0834 01/25/23 1146  GLUCAP 194* 144* 135* 127* 175*    Discharge time spent: greater than 30 minutes.  Signed: Marcelino Duster, MD [facility] 01/25/2023

## 2023-01-25 NOTE — TOC CM/SW Note (Signed)
Transition of Care [facility]) - Inpatient Brief Assessment   Patient Details  Name: Chase Arrendale MRN: [id number] Date of Birth: 28-Jan-1951  Transition of Care [facility]) CM/SW Contact:    Margarito Liner, LCSW Phone Number: 01/25/2023, 1:31 PM   Clinical Narrative: Patient has orders to discharge home today. Chart reviewed. No TOC needs identified. CSW signing off.  Transition of Care Asessment: Insurance and Status: Insurance coverage has been reviewed Patient has primary care physician: Yes Home environment has been reviewed: Single family home Prior level of function:: Independent Prior/Current Home Services: No current home services Social Determinants of Health Reivew: SDOH reviewed no interventions necessary Readmission risk has been reviewed: Yes Transition of care needs: no transition of care needs at this time

## 2023-01-26 NOTE — Discharge Summary (Signed)
Physician Discharge Summary   Patient: Erik Black MRN: [id number] DOB: [birth year]  Admit date:     01/22/2023  Discharge date: 01/25/2023  Discharge Physician: Marcelino Duster   PCP: [facility]   Recommendations at discharge:    PCP follow up in 1 week. Cardiology follow up as scheduled.  Discharge Diagnoses: Principal Problem:   Acute CVA (cerebrovascular accident) [facility]) Active Problems:   Chest pain   Acute on chronic systolic CHF (congestive heart failure) (HCC)   Dyslipidemia   Essential hypertension   Type 2 diabetes mellitus with peripheral neuropathy (HCC)  Resolved Problems:   * No resolved hospital problems. [facility] Course: As per HPI -Erik Black is a 72 y.o. African-American male with medical history significant for CHF, GERD, hypertension, OSA on CPAP, anxiety and depression, who presented to the emergency room with acute onset of altered mental status with disorientation and lightheadedness as well as feeling off balance when he was sitting and get up at 9 AM.  He stated he could not move forward or backward.  He went to the bedroom after that started having slurred speech and felt left-sided tingling and numbness in the upper and lower extremity.  His left side felt heavy.  He has been having chest pain feeling like pressure and graded 6/10 in severity with associated dyspnea without palpitations lately including today that resolved spontaneously.  He admitted to orthopnea as well as dyspnea on exertion without lower extremity edema or paroxysmal nocturnal dyspnea.  In the emergency department he had head and neck CTA revealed no acute etiology, no large vessel occlusion.  Patient is seen by neurology who recommended MRI brain.  Patient's MRI brain without acute intracranial process.  Neurology recommended symptoms could be due to cardiac arrhythmias.  Patient is seen by cardiology team, EP physician, heart failure team.  Patient's Coreg,  mexiletine, Entresto restarted.  Repeat radiation recommended to observe another day on telemetry as he had an episode of sharp chest pain.  Telemetry uneventful, echocardiogram showed EF 25 to 30% with global hypokinesis.  Patient is scheduled for ICD placement with Dr. Lalla Brothers on 01/28/2023.  He is hemodynamically stable to be discharged home.  Advised to follow-up with PCP, cardiology upon discharge as instructed.  He understands and agrees with the discharge plan.  Assessment and Plan: * Acute CVA (cerebrovascular accident) (HCC) Ruled out. Patient is continued on aspirin, statin. Brain MRI, head and neck CTA unremarkable. Echo with bubble study showed EF 25 to 30% with global hypokinesis, no interatrial shunt.  Neurology feels her symptoms are due to cardiac arrhythmias.  Chest pain, tachyarrhythmias Troponins unremarkable.  Patient had an episode of sharp chest pain during the hospital stay.  EKG, troponin repeated negative.  Patient is continued on aspirin, statin, sublingual nitroglycerin.  Telemetry uneventful.  EP physician consulted who recommended to monitor and outpatient follow-up for ICD placement 01/28/2023.  Acute on chronic systolic CHF (congestive heart failure) (HCC) Patient is seen by heart failure team.  Echo shows EF 25 to 30%. Patient is started on Coreg, mexiletine, Entresto home dose.  He is euvolemic. Patient is advised to follow-up with cardiology upon discharge.  Dyslipidemia Continue statin therapy  Type 2 diabetes mellitus with peripheral neuropathy (HCC) Patient is A1c 10.5.  He is started on Semglee 15 units, advised to keep a log of blood sugars and follow-up with PCP.  Essential hypertension Blood pressure stable, continue on Coreg, Norvasc therapy.         Consultants:  Cardiology Procedures performed: none  Disposition: Home Diet recommendation:  Discharge Diet Orders (From admission, onward)     Start     Ordered   01/25/23 0000  Diet - low  sodium heart healthy        01/25/23 1329           Cardiac and Carb modified diet DISCHARGE MEDICATION: Allergies as of 01/25/2023       Reactions   Empagliflozin Other (See Comments)   Yeast infection Other Reaction(s): Bleeding skin   Methadone    Oxycodone Itching   Hands started peeling and "could not swallow"   Lisinopril Cough        Medication List     STOP taking these medications    Klor-Con M20 20 MEQ tablet Generic drug: potassium chloride SA       TAKE these medications    amLODipine 10 MG tablet Commonly known as: NORVASC Take 10 mg by mouth daily.   aspirin EC 81 MG tablet Take 1 tablet (81 mg total) by mouth 2 (two) times daily.   atorvastatin 40 MG tablet Commonly known as: LIPITOR Take 1 tablet (40 mg total) by mouth at bedtime.   Breztri Aerosphere 160-9-4.8 MCG/ACT Aero Generic drug: Budeson-Glycopyrrol-Formoterol Inhale 1 puff into the lungs 2 (two) times daily.   Brinzolamide-Brimonidine 1-0.2 % Susp Apply 1 drop to eye 3 (three) times daily.   carvedilol 25 MG tablet Commonly known as: COREG TAKE 1 TABLET BY MOUTH TWICE A DAY   docusate sodium 100 MG capsule Commonly known as: COLACE Take 100 mg by mouth daily as needed.   DULoxetine 60 MG capsule Commonly known as: CYMBALTA Take 60 mg by mouth daily.   insulin glargine-yfgn 100 UNIT/ML injection Commonly known as: SEMGLEE Inject 0.15 mLs (15 Units total) into the skin at bedtime.   latanoprost 0.005 % ophthalmic solution Commonly known as: XALATAN Place 1 drop into both eyes at bedtime.   mexiletine 200 MG capsule Commonly known as: MEXITIL TAKE 1 CAPSULE BY MOUTH TWICE A DAY   multivitamin with minerals tablet Take 1 tablet by mouth daily.   Omega-3 1000 MG Caps Take 2,000 mg by mouth 3 (three) times daily after meals.   omeprazole 20 MG capsule Commonly known as: PRILOSEC Take 20 mg by mouth 2 (two) times daily.   OXcarbazepine 150 MG tablet Commonly  known as: TRILEPTAL Take 150 mg by mouth 2 (two) times daily.   sacubitril-valsartan 97-103 MG Commonly known as: ENTRESTO Take 1 tablet by mouth 2 (two) times daily.   Semaglutide(0.25 or 0.5MG /DOS) 2 MG/3ML Sopn Inject 0.5 mg into the skin once a week.   spironolactone 25 MG tablet Commonly known as: ALDACTONE TAKE 1 TABLET (25 MG TOTAL) BY MOUTH DAILY.   timolol 0.5 % ophthalmic gel-forming Commonly known as: TIMOPTIC-XR Place 1 drop into both eyes daily.   torsemide 20 MG tablet Commonly known as: DEMADEX TAKE 2 TABLETS (40 MG TOTAL) BY MOUTH DAILY.   Vitamin D 50 MCG (2000 UT) tablet Take 4,000 Units by mouth daily.        Discharge Exam: Filed Weights   01/22/23 0929  Weight: 124.5 kg   General - Elderly obese African-American male, no apparent distress HEENT - PERRLA, EOMI, atraumatic head, non tender sinuses. Lung - Clear, rales, rhonchi, wheezes. Heart - S1, S2 heard, no murmurs, rubs, trace pedal edema Neuro - Alert, awake and oriented x 3, non focal exam. Skin - Warm and dry.  Condition  at discharge: stable  The results of significant diagnostics from this hospitalization (including imaging, microbiology, ancillary and laboratory) are listed below for reference.   Imaging Studies: ECHOCARDIOGRAM LIMITED  Result Date: 01/23/2023    ECHOCARDIOGRAM LIMITED REPORT   Patient Name:   OCTAVIUS PRAK Date of Exam: 01/23/2023 Medical Rec #:  [id number]          Height:       72.0 in Accession #:    [id number]         Weight:       274.5 lb Date of Birth:  08/21/1950          BSA:          2.437 m Patient Age:    71 years           BP:           120/78 mmHg Patient Gender: M                  HR:           66 bpm. [facility] Procedure: Limited Echo, Cardiac Doppler and Color Doppler Indications:     Chest pain R07.9  History:         Patient has prior history of Echocardiogram examinations, most                  recent 09/11/2022. CHF; Risk  Factors:Hypertension and Diabetes.  Sonographer:     Cristela Blue Referring Phys:  [id number] CADENCE H FURTH Diagnosing Phys: Julien Nordmann MD  Sonographer Comments: Technically challenging study due to limited acoustic windows and no apical window. IMPRESSIONS  1. Left ventricular ejection fraction, by estimation, is 25 to 30%. Left ventricular ejection fraction by PLAX is 29 %. The left ventricle has severely decreased function. The left ventricle demonstrates global hypokinesis. The left ventricular internal  cavity size was mildly dilated.  2. Right ventricular systolic function is normal. The right ventricular size is normal. Tricuspid regurgitation signal is inadequate for assessing PA pressure.  3. The mitral valve is normal in structure. No evidence of mitral valve regurgitation. No evidence of mitral stenosis.  4. The aortic valve is normal in structure. Aortic valve regurgitation is not visualized. No aortic stenosis is present.  5. The inferior vena cava is normal in size with greater than 50% respiratory variability, suggesting right atrial pressure of 3 mmHg. FINDINGS  Left Ventricle: Left ventricular ejection fraction, by estimation, is 25 to 30%. Left ventricular ejection fraction by PLAX is 29 %. The left ventricle has severely decreased function. The left ventricle demonstrates global hypokinesis. The left ventricular internal cavity size was mildly dilated. There is no left ventricular hypertrophy. Right Ventricle: The right ventricular size is normal. No increase in right ventricular wall thickness. Right ventricular systolic function is normal. Tricuspid regurgitation signal is inadequate for assessing PA pressure. Left Atrium: Left atrial size was normal in size. Right Atrium: Right atrial size was normal in size. Pericardium: There is no evidence of pericardial effusion. Mitral Valve: The mitral valve is normal in structure. No evidence of mitral valve stenosis. Tricuspid Valve: The tricuspid  valve is normal in structure. Tricuspid valve regurgitation is not demonstrated. No evidence of tricuspid stenosis. Aortic Valve: The aortic valve is normal in structure. Aortic valve regurgitation is not visualized. No aortic stenosis is present. Pulmonic Valve: The pulmonic valve was normal in structure. Pulmonic valve regurgitation is not visualized. No evidence of pulmonic stenosis. Aorta:  The aortic root is normal in size and structure. Venous: The inferior vena cava is normal in size with greater than 50% respiratory variability, suggesting right atrial pressure of 3 mmHg. IAS/Shunts: No atrial level shunt detected by color flow Doppler. Additional Comments: Spectral Doppler performed. Color Doppler performed.  LEFT VENTRICLE PLAX 2D LV EF:         Left ventricular ejection fraction by PLAX is 29 %. LVIDd:         5.80 cm LVIDs:         5.00 cm LV PW:         1.10 cm LV IVS:        1.30 cm  LEFT ATRIUM         Index LA diam:    3.50 cm 1.44 cm/m Julien Nordmann MD Electronically signed by Julien Nordmann MD Signature Date/Time: 01/23/2023/1:53:14 PM    Final    MR BRAIN WO CONTRAST  Result Date: 01/22/2023 CLINICAL DATA:  Neuro deficit, acute, stroke suspected. Altered mental status. EXAM: MRI HEAD WITHOUT CONTRAST TECHNIQUE: Multiplanar, multiecho pulse sequences of the brain and surrounding structures were obtained without intravenous contrast. COMPARISON:  Head CT and CTA head/neck 01/22/2023. FINDINGS: Brain: No acute infarct or hemorrhage. Moderate chronic small-vessel disease. No mass or midline shift. No hydrocephalus or extra-axial collection. No abnormal susceptibility. Vascular: Normal flow voids. Skull and upper cervical spine: Normal marrow signal. Sinuses/Orbits: Mucous retention cysts in the right maxillary sinus. Orbits are unremarkable. Other: None. IMPRESSION: 1. No acute intracranial process. 2. Moderate chronic small-vessel disease. Electronically Signed   By: Orvan Falconer M.D.   On:  01/22/2023 17:52   CT Angio Chest PE W/Cm &/Or Wo Cm  Result Date: 01/22/2023 CLINICAL DATA:  Altered mental status, slurred speech, chest pain. EXAM: CT ANGIOGRAPHY CHEST WITH CONTRAST TECHNIQUE: Multidetector CT imaging of the chest was performed using the standard protocol during bolus administration of intravenous contrast. Multiplanar CT image reconstructions and MIPs were obtained to evaluate the vascular anatomy. RADIATION DOSE REDUCTION: This exam was performed according to the departmental dose-optimization program which includes automated exposure control, adjustment of the mA and/or kV according to patient size and/or use of iterative reconstruction technique. CONTRAST:  75mL OMNIPAQUE IOHEXOL 350 MG/ML SOLN COMPARISON:  Chest CT dated 06/01/2022. FINDINGS: Cardiovascular: Preferential opacification of the thoracic aorta limits the sensitivity of the exam for evaluation of pulmonary embolism. Given this limitation, no large pulmonary embolism is identified. Vascular calcifications are seen in the coronary arteries and aortic arch. The heart is mildly enlarged. No pericardial effusion. Mediastinum/Nodes: No enlarged mediastinal, hilar, or axillary lymph nodes. A 1.7 cm hypoattenuating nodule in the left thyroid appears similar to 06/01/2022. The trachea and esophagus demonstrate no significant findings. Lungs/Pleura: There are mild scattered bilateral ground-glass opacities and mild bilateral dependent atelectasis. A large bulla in the right upper lobe with nearby atelectasis/scarring of the right upper and right middle lobes appear similar to prior exam. The previously described ground-glass nodules in the left upper lobe have since resolved, consistent with a infectious/inflammatory process. No pleural effusion or pneumothorax. Upper Abdomen: No acute abnormality. Musculoskeletal: Degenerative changes are seen in the spine. Review of the MIP images confirms the above findings. IMPRESSION: 1. Limited  sensitivity of the exam for pulmonary embolism due to bolus timing. Given this limitation, no large pulmonary embolism. 2. Mild scattered bilateral ground-glass opacities may reflect atelectasis or mild pulmonary edema. Aortic Atherosclerosis (ICD10-I70.0). Electronically Signed   By: Foye Spurling.D.  On: 01/22/2023 13:02   CT ANGIO HEAD NECK W WO CM (CODE STROKE)  Result Date: 01/22/2023 CLINICAL DATA:  Neuro deficit, acute, stroke suspected EXAM: CT ANGIOGRAPHY HEAD AND NECK WITH AND WITHOUT CONTRAST TECHNIQUE: Multidetector CT imaging of the head and neck was performed using the standard protocol during bolus administration of intravenous contrast. Multiplanar CT image reconstructions and MIPs were obtained to evaluate the vascular anatomy. Carotid stenosis measurements (when applicable) are obtained utilizing NASCET criteria, using the distal internal carotid diameter as the denominator. RADIATION DOSE REDUCTION: This exam was performed according to the departmental dose-optimization program which includes automated exposure control, adjustment of the mA and/or kV according to patient size and/or use of iterative reconstruction technique. CONTRAST:  75mL OMNIPAQUE IOHEXOL 350 MG/ML SOLN COMPARISON:  None Available. FINDINGS: CTA NECK FINDINGS Aortic arch: Great vessel origins are patent without significant stenosis. Right carotid system: No evidence of dissection, stenosis (50% or greater), or occlusion. Left carotid system: No evidence of dissection, stenosis (50% or greater), or occlusion. Vertebral arteries: Left dominant. No evidence of dissection, stenosis (50% or greater), or occlusion. Skeleton: No acute abnormality on limited assessment. Bridging osteophytes. Other neck: Subcentimeter thyroid nodules not require further imaging follow-up (ref: J Am Coll Radiol. 2015 Feb;12(2): 143-50). Upper chest: Evaluated on same day CTA chest. Review of the MIP images confirms the above findings CTA HEAD  FINDINGS Anterior circulation: Bilateral intracranial ICAs, MCAs, and ACAs are patent without proximal hemodynamically significant stenosis. No aneurysm. Posterior circulation: Bilateral intradural vertebral arteries, basilar artery and bilateral posterior cerebral arteries are patent without proximal hemodynamically significant stenosis. Venous sinuses: As permitted by contrast timing, patent. Review of the MIP images confirms the above findings IMPRESSION: No emergent large vessel occlusion or proximal hemodynamically significant stenosis. Electronically Signed   By: Feliberto Harts M.D.   On: 01/22/2023 12:07   CT HEAD WO CONTRAST ( )  Result Date: 01/22/2023 CLINICAL DATA:  Altered mental status, nontraumatic (Ped 0-17y) EXAM: CT HEAD WITHOUT CONTRAST TECHNIQUE: Contiguous axial images were obtained from the base of the skull through the vertex without intravenous contrast. RADIATION DOSE REDUCTION: This exam was performed according to the departmental dose-optimization program which includes automated exposure control, adjustment of the mA and/or kV according to patient size and/or use of iterative reconstruction technique. COMPARISON:  None Available. FINDINGS: Brain: No evidence of acute infarction, hemorrhage, hydrocephalus, extra-axial collection or mass lesion/mass effect. Sequela of mild chronic microvascular ischemic change. Vascular: No hyperdense vessel or unexpected calcification. Skull: Normal. Negative for fracture or focal lesion. Sinuses/Orbits: No middle ear or mastoid effusion. Paranasal sinuses are notable for polypoid mucosal thickening in the right maxillary sinus. Bilateral lens replacement. Orbits are otherwise unremarkable. Other: None. IMPRESSION: No acute intracranial abnormality. Electronically Signed   By: Lorenza Cambridge M.D.   On: 01/22/2023 10:27   DG Chest 2 View  Result Date: 01/22/2023 CLINICAL DATA:  Chest pain EXAM: CHEST - 2 VIEW COMPARISON:  06/01/2022 FINDINGS:  Eventration of the diaphragm. There is some linear opacity along the lung bases likely scar or atelectasis. No consolidation, pneumothorax or edema. Normal cardiopericardial silhouette. Calcified aorta. Degenerative changes of the spine with bridging syndesmophytes diffusely. Films are under penetrated. IMPRESSION: Under penetrated radiographs. Basilar areas of scarring and atelectatic change. Electronically Signed   By: Karen Kays M.D.   On: 01/22/2023 10:22    Microbiology: Results for orders placed or performed during the hospital encounter of 11/19/19  SARS CORONAVIRUS 2 (TAT 6-24 HRS) Nasopharyngeal Nasopharyngeal Swab  Status: None   Collection Time: 11/19/19 12:38 PM   Specimen: Nasopharyngeal Swab  Result Value Ref Range Status   SARS Coronavirus 2 NEGATIVE NEGATIVE Final    Comment: (NOTE) SARS-CoV-2 target nucleic acids are NOT DETECTED. The SARS-CoV-2 RNA is generally detectable in upper and lower respiratory specimens during the acute phase of infection. Negative results do not preclude SARS-CoV-2 infection, do not rule out co-infections with other pathogens, and should not be used as the sole basis for treatment or other patient management decisions. Negative results must be combined with clinical observations, patient history, and epidemiological information. The expected result is Negative. Fact Sheet for Patients: HairSlick.no Fact Sheet for Healthcare Providers: quierodirigir.com This test is not yet approved or cleared by the [place] FDA and  has been authorized for detection and/or diagnosis of SARS-CoV-2 by FDA under an Emergency Use Authorization (EUA). This EUA will remain  in effect (meaning this test can be used) for the duration of the COVID-19 declaration under Section 56 4(b)(1) of the Act, 21 U.S.C. section 360bbb-3(b)(1), unless the authorization is terminated or revoked sooner. Performed at  [facility], [address] [place], [place] [address]     Labs: CBC: Recent Labs  Lab 01/22/23 0935 01/23/23 0526  WBC 9.5  9.4 5.8  NEUTROABS 7.0  --   HGB 14.7  14.8 13.9  HCT 45.4  45.3 42.4  MCV 89.0  89.3 89.8  PLT 157  162 144*   Basic Metabolic Panel: Recent Labs  Lab 01/22/23 0935 01/23/23 0526 01/24/23 1444 01/25/23 0927  NA 135  133* 136 133* 137  K 4.2  4.3 4.4 4.1 4.2  CL 102  102 103 105 104  CO2 24  21* 27 23 24   GLUCOSE 273*  287* 197* 237* 148*  BUN 22  20 24* 17 18  CREATININE 2.12*  1.93* 1.70* 1.33* 1.24  CALCIUM 9.4  9.3 9.2 9.4 9.7  MG  --   --  2.5* 2.4   Liver Function Tests: Recent Labs  Lab 01/22/23 0935  AST 20  ALT 15  ALKPHOS 120  BILITOT 0.8  PROT 7.2  ALBUMIN 4.1   CBG: Recent Labs  Lab 01/24/23 1215 01/24/23 1708 01/24/23 1954 01/25/23 0834 01/25/23 1146  GLUCAP 194* 144* 135* 127* 175*    Discharge time spent: greater than 30 minutes.  Signed: Marcelino Duster, MD [facility] 01/26/2023

## 2023-01-27 NOTE — Pre-Procedure Instructions (Signed)
Instructed patient on the following items: Arrival time 2:00 Nothing to eat or drink after midnight No meds AM of procedure Responsible person to drive you home and stay with you for 24 hrs Wash with special soap night before and morning of procedure  

## 2023-01-28 ENCOUNTER — Encounter (HOSPITAL_COMMUNITY)
Admission: RE | Disposition: A | Discharge status: Home / Self Care | Payer: Self-pay | Source: Home / Self Care | Attending: Cardiology

## 2023-01-28 ENCOUNTER — Other Ambulatory Visit: Payer: Self-pay

## 2023-01-28 ENCOUNTER — Ambulatory Visit (HOSPITAL_COMMUNITY)
Admission: RE | Admit: 2023-01-28 | Discharge: 2023-01-29 | Disposition: A | Discharge status: Home / Self Care | Payer: Medicare HMO | Attending: Cardiology | Admitting: Cardiology

## 2023-01-28 DIAGNOSIS — I2510 Atherosclerotic heart disease of native coronary artery without angina pectoris: Secondary | ICD-10-CM | POA: Insufficient documentation

## 2023-01-28 DIAGNOSIS — I110 Hypertensive heart disease with heart failure: Secondary | ICD-10-CM | POA: Insufficient documentation

## 2023-01-28 DIAGNOSIS — Z6837 Body mass index (BMI) 37.0-37.9, adult: Secondary | ICD-10-CM | POA: Insufficient documentation

## 2023-01-28 DIAGNOSIS — Z79899 Other long term (current) drug therapy: Secondary | ICD-10-CM | POA: Diagnosis not present

## 2023-01-28 DIAGNOSIS — G4730 Sleep apnea, unspecified: Secondary | ICD-10-CM | POA: Insufficient documentation

## 2023-01-28 DIAGNOSIS — I5022 Chronic systolic (congestive) heart failure: Secondary | ICD-10-CM | POA: Diagnosis not present

## 2023-01-28 DIAGNOSIS — E119 Type 2 diabetes mellitus without complications: Secondary | ICD-10-CM | POA: Insufficient documentation

## 2023-01-28 DIAGNOSIS — Z95810 Presence of automatic (implantable) cardiac defibrillator: Secondary | ICD-10-CM | POA: Diagnosis present

## 2023-01-28 DIAGNOSIS — Z8249 Family history of ischemic heart disease and other diseases of the circulatory system: Secondary | ICD-10-CM | POA: Insufficient documentation

## 2023-01-28 DIAGNOSIS — Z7982 Long term (current) use of aspirin: Secondary | ICD-10-CM | POA: Diagnosis not present

## 2023-01-28 DIAGNOSIS — I429 Cardiomyopathy, unspecified: Secondary | ICD-10-CM

## 2023-01-28 DIAGNOSIS — I428 Other cardiomyopathies: Secondary | ICD-10-CM | POA: Diagnosis not present

## 2023-01-28 DIAGNOSIS — E669 Obesity, unspecified: Secondary | ICD-10-CM | POA: Diagnosis not present

## 2023-01-28 HISTORY — PX: ICD IMPLANT: EP1208

## 2023-01-28 LAB — GLUCOSE, CAPILLARY
Glucose-Capillary: 183 mg/dL — ABNORMAL HIGH (ref 70–99)
Glucose-Capillary: 135 mg/dL — ABNORMAL HIGH (ref 70–99)
Glucose-Capillary: 234 mg/dL — ABNORMAL HIGH (ref 70–99)

## 2023-01-28 SURGERY — ICD IMPLANT

## 2023-01-28 MED ORDER — OXCARBAZEPINE 150 MG PO TABS
150.0000 mg | ORAL_TABLET | Freq: Two times a day (BID) | ORAL | Status: DC
  Administered 2023-01-28 – 2023-01-29 (×2): 150 mg via ORAL
  Filled 2023-01-28 (×3): qty 1

## 2023-01-28 MED ORDER — HEPARIN (PORCINE) IN NACL 1000-0.9 UT/500ML-% IV SOLN
INTRAVENOUS | Status: DC | PRN
  Administered 2023-01-28: 500 mL

## 2023-01-28 MED ORDER — INSULIN GLARGINE-YFGN 100 UNIT/ML ~~LOC~~ SOLN
15.0000 [IU] | Freq: Every day | SUBCUTANEOUS | Status: DC
  Administered 2023-01-28: 15 [IU] via SUBCUTANEOUS
  Filled 2023-01-28 (×2): qty 0.15

## 2023-01-28 MED ORDER — MIDAZOLAM HCL 5 MG/5ML IJ SOLN
INTRAMUSCULAR | Status: AC
  Filled 2023-01-28: qty 5

## 2023-01-28 MED ORDER — CARVEDILOL 25 MG PO TABS
25.0000 mg | ORAL_TABLET | Freq: Two times a day (BID) | ORAL | Status: DC
  Administered 2023-01-28 – 2023-01-29 (×2): 25 mg via ORAL
  Filled 2023-01-28 (×2): qty 1

## 2023-01-28 MED ORDER — MEXILETINE HCL 200 MG PO CAPS
200.0000 mg | ORAL_CAPSULE | Freq: Two times a day (BID) | ORAL | Status: DC
  Filled 2023-01-28: qty 1

## 2023-01-28 MED ORDER — INSULIN ASPART 100 UNIT/ML IJ SOLN
0.0000 [IU] | Freq: Three times a day (TID) | INTRAMUSCULAR | Status: DC
  Administered 2023-01-29: 3 [IU] via SUBCUTANEOUS
  Administered 2023-01-29: 2 [IU] via SUBCUTANEOUS

## 2023-01-28 MED ORDER — CEFAZOLIN IN SODIUM CHLORIDE 3-0.9 GM/100ML-% IV SOLN
3.0000 g | INTRAVENOUS | Status: AC
  Administered 2023-01-28: 3 g via INTRAVENOUS
  Filled 2023-01-28: qty 100

## 2023-01-28 MED ORDER — ACETAMINOPHEN 325 MG PO TABS
325.0000 mg | ORAL_TABLET | ORAL | Status: DC | PRN
  Administered 2023-01-28 – 2023-01-29 (×2): 650 mg via ORAL
  Filled 2023-01-28 (×2): qty 2

## 2023-01-28 MED ORDER — SODIUM CHLORIDE 0.9 % IV SOLN
INTRAVENOUS | Status: AC
  Filled 2023-01-28: qty 2

## 2023-01-28 MED ORDER — ARFORMOTEROL TARTRATE 15 MCG/2ML IN NEBU
15.0000 ug | INHALATION_SOLUTION | Freq: Two times a day (BID) | RESPIRATORY_TRACT | Status: DC
  Administered 2023-01-28 – 2023-01-29 (×2): 15 ug via RESPIRATORY_TRACT
  Filled 2023-01-28 (×2): qty 2

## 2023-01-28 MED ORDER — FENTANYL CITRATE (PF) 100 MCG/2ML IJ SOLN
INTRAMUSCULAR | Status: AC
  Filled 2023-01-28: qty 2

## 2023-01-28 MED ORDER — AMLODIPINE BESYLATE 5 MG PO TABS
10.0000 mg | ORAL_TABLET | Freq: Every day | ORAL | Status: DC
  Administered 2023-01-29: 10 mg via ORAL
  Filled 2023-01-28: qty 2

## 2023-01-28 MED ORDER — LIDOCAINE HCL 1 % IJ SOLN
INTRAMUSCULAR | Status: AC
  Filled 2023-01-28: qty 20

## 2023-01-28 MED ORDER — SACUBITRIL-VALSARTAN 97-103 MG PO TABS
1.0000 | ORAL_TABLET | Freq: Two times a day (BID) | ORAL | Status: DC
  Administered 2023-01-28 – 2023-01-29 (×2): 1 via ORAL
  Filled 2023-01-28 (×2): qty 1

## 2023-01-28 MED ORDER — FENTANYL CITRATE (PF) 100 MCG/2ML IJ SOLN
INTRAMUSCULAR | Status: DC | PRN
  Administered 2023-01-28 (×2): 25 ug via INTRAVENOUS

## 2023-01-28 MED ORDER — LIDOCAINE HCL (PF) 1 % IJ SOLN
INTRAMUSCULAR | Status: DC | PRN
  Administered 2023-01-28: 60 mL

## 2023-01-28 MED ORDER — PANTOPRAZOLE SODIUM 40 MG PO TBEC
40.0000 mg | DELAYED_RELEASE_TABLET | Freq: Every day | ORAL | Status: DC
  Administered 2023-01-29: 40 mg via ORAL
  Filled 2023-01-28: qty 1

## 2023-01-28 MED ORDER — BUDESONIDE 0.25 MG/2ML IN SUSP
0.2500 mg | Freq: Two times a day (BID) | RESPIRATORY_TRACT | Status: DC
  Administered 2023-01-28 – 2023-01-29 (×2): 0.25 mg via RESPIRATORY_TRACT
  Filled 2023-01-28 (×2): qty 2

## 2023-01-28 MED ORDER — DULOXETINE HCL 60 MG PO CPEP
60.0000 mg | ORAL_CAPSULE | Freq: Every day | ORAL | Status: DC
  Administered 2023-01-29: 60 mg via ORAL
  Filled 2023-01-28: qty 1

## 2023-01-28 MED ORDER — LIDOCAINE HCL 1 % IJ SOLN
INTRAMUSCULAR | Status: AC
  Filled 2023-01-28: qty 60

## 2023-01-28 MED ORDER — UMECLIDINIUM BROMIDE 62.5 MCG/ACT IN AEPB
1.0000 | INHALATION_SPRAY | Freq: Every day | RESPIRATORY_TRACT | Status: DC
  Filled 2023-01-28: qty 7

## 2023-01-28 MED ORDER — SPIRONOLACTONE 25 MG PO TABS
25.0000 mg | ORAL_TABLET | Freq: Every day | ORAL | Status: DC
  Administered 2023-01-28 – 2023-01-29 (×2): 25 mg via ORAL
  Filled 2023-01-28 (×2): qty 1

## 2023-01-28 MED ORDER — SODIUM CHLORIDE 0.9 % IV SOLN
80.0000 mg | INTRAVENOUS | Status: AC
  Administered 2023-01-28: 80 mg

## 2023-01-28 MED ORDER — BUDESON-GLYCOPYRROL-FORMOTEROL 160-9-4.8 MCG/ACT IN AERO: 1.0000 | INHALATION_SPRAY | Freq: Two times a day (BID) | RESPIRATORY_TRACT | Status: DC

## 2023-01-28 MED ORDER — ONDANSETRON HCL 4 MG/2ML IJ SOLN: 4.0000 mg | Freq: Four times a day (QID) | INTRAMUSCULAR | Status: DC | PRN

## 2023-01-28 MED ORDER — ATORVASTATIN CALCIUM 40 MG PO TABS
40.0000 mg | ORAL_TABLET | Freq: Every day | ORAL | Status: DC
  Administered 2023-01-28: 40 mg via ORAL
  Filled 2023-01-28: qty 1

## 2023-01-28 MED ORDER — POVIDONE-IODINE 10 % EX SWAB
2.0000 | Freq: Once | CUTANEOUS | Status: AC
  Administered 2023-01-28: 2 via TOPICAL

## 2023-01-28 MED ORDER — SODIUM CHLORIDE 0.9 % IV SOLN: INTRAVENOUS | Status: DC

## 2023-01-28 MED ORDER — MIDAZOLAM HCL 5 MG/5ML IJ SOLN
INTRAMUSCULAR | Status: DC | PRN
  Administered 2023-01-28 (×2): 1 mg via INTRAVENOUS

## 2023-01-28 SURGICAL SUPPLY — 8 items
CABLE SURGICAL S-101-97-12 (CABLE) ×1 IMPLANT
ICD COBALT XT VR DVPA2D4 (ICD Generator) IMPLANT
LEAD SPRINT QUAT SEC 6935M-62 (Lead) IMPLANT
MAT PREVALON FULL STRYKER (MISCELLANEOUS) IMPLANT
PAD DEFIB RADIO PHYSIO CONN (PAD) ×1 IMPLANT
SHEATH 9FR PRELUDE SNAP 13 (SHEATH) IMPLANT
SHEATH PROBE COVER 6X72 (BAG) IMPLANT
TRAY PACEMAKER INSERTION (PACKS) ×1 IMPLANT

## 2023-01-28 NOTE — Interval H&P Note (Signed)
History and Physical Interval Note:  01/28/2023 3:10 PM  Erik Black  has presented today for surgery, with the diagnosis of heart failure.  The various methods of treatment have been discussed with the patient and family. After consideration of risks, benefits and other options for treatment, the patient has consented to  Procedure(s): ICD IMPLANT (N/A) as a surgical intervention.  The patient's history has been reviewed, patient examined, no change in status, stable for surgery.  I have reviewed the patient's chart and labs.  Questions were answered to the patient's satisfaction.     Erik Black

## 2023-01-28 NOTE — Progress Notes (Signed)
Orthopedic Tech Progress Note Patient Details:  Erik Black 01/24/1951 [id number]  Patient ID: [name], male   DOB: [birth year], 72 y.o.   MRN: [id number] Patient doesn't meet criteria for OHF. Patient must be under 70 to get OHF. Trinna Post 01/28/2023, 9:23 PM

## 2023-01-29 ENCOUNTER — Encounter (HOSPITAL_COMMUNITY): Payer: Self-pay | Admitting: Cardiology

## 2023-01-29 ENCOUNTER — Other Ambulatory Visit (HOSPITAL_COMMUNITY): Payer: Self-pay

## 2023-01-29 ENCOUNTER — Ambulatory Visit (HOSPITAL_COMMUNITY): Payer: Medicare HMO

## 2023-01-29 DIAGNOSIS — Z95810 Presence of automatic (implantable) cardiac defibrillator: Secondary | ICD-10-CM

## 2023-01-29 DIAGNOSIS — I5022 Chronic systolic (congestive) heart failure: Secondary | ICD-10-CM | POA: Diagnosis not present

## 2023-01-29 DIAGNOSIS — I428 Other cardiomyopathies: Secondary | ICD-10-CM | POA: Diagnosis not present

## 2023-01-29 DIAGNOSIS — I110 Hypertensive heart disease with heart failure: Secondary | ICD-10-CM | POA: Diagnosis not present

## 2023-01-29 DIAGNOSIS — G4730 Sleep apnea, unspecified: Secondary | ICD-10-CM | POA: Diagnosis not present

## 2023-01-29 LAB — GLUCOSE, CAPILLARY
Glucose-Capillary: 157 mg/dL — ABNORMAL HIGH (ref 70–99)
Glucose-Capillary: 137 mg/dL — ABNORMAL HIGH (ref 70–99)

## 2023-01-29 MED ORDER — INSULIN GLARGINE 100 UNIT/ML SOLOSTAR PEN
15.0000 [IU] | PEN_INJECTOR | Freq: Every day | SUBCUTANEOUS | 1 refills | Status: DC
  Filled 2023-01-29: qty 15, 100d supply, fill #0
  Filled 2023-05-01: qty 15, 100d supply, fill #1

## 2023-01-29 MED ORDER — INSULIN PEN NEEDLE 32G X 4 MM MISC
1.0000 | Freq: Every day | 1 refills | Status: DC
Start: 2023-01-29 — End: 2024-01-08
  Filled 2023-01-29: qty 100, 100d supply, fill #0
  Filled 2023-06-03: qty 100, 100d supply, fill #1

## 2023-01-29 MED ORDER — ASPIRIN 81 MG PO TBEC
81.0000 mg | DELAYED_RELEASE_TABLET | Freq: Two times a day (BID) | ORAL | 0 refills | Status: DC
  Filled 2023-01-29: qty 120, 60d supply, fill #0

## 2023-01-29 NOTE — Inpatient Diabetes Management (Signed)
Inpatient Diabetes Program Recommendations  AACE/ADA: New Consensus Statement on Inpatient Glycemic Control (2015)  Target Ranges:  Prepandial:   less than 140 mg/dL      Peak postprandial:   less than 180 mg/dL (1-2 hours)      Critically ill patients:  140 - 180 mg/dL   Lab Results  Component Value Date   GLUCAP 157 (H) 01/29/2023   HGBA1C 10.5 (H) 01/22/2023    Waiting on benefits check on basal insulins.  Thanks,  Christena Deem RN, MSN, BC-ADM Inpatient Diabetes Coordinator Team Pager [phone] (8a-5p)

## 2023-01-29 NOTE — TOC Benefit Eligibility Note (Signed)
Pharmacy Patient Advocate Encounter  Insurance verification completed.    The patient is insured through [employer]   Ran test claim for Lantus Solostar/Vial. Currently a quantity of 71mL/10mL is a 30 day supply and the co-pay is $0.00 .   Ran test claim for [employer]. Currently a quantity of 4.52mL/6mL is a 30 day supply and the co-pay is $0.00 .   Ran test claim for [place] Flextouch/Vial. Currently a quantity of 71mL/10mL is a 30 day supply and the co-pay is $0.00 . Marland Kitchen   This test claim was processed through [facility]- copay amounts may vary at other pharmacies due to pharmacy/plan contracts, or as the patient moves through the different stages of their insurance plan.

## 2023-01-29 NOTE — Inpatient Diabetes Management (Signed)
Inpatient Diabetes Program Recommendations  AACE/ADA: New Consensus Statement on Inpatient Glycemic Control (2015)  Target Ranges:  Prepandial:   less than 140 mg/dL      Peak postprandial:   less than 180 mg/dL (1-2 hours)      Critically ill patients:  140 - 180 mg/dL   Lab Results  Component Value Date   GLUCAP 137 (H) 01/29/2023   HGBA1C 10.5 (H) 01/22/2023     Discharge Recommendations: Long acting recommendations: Insulin Glargine (LANTUS) Solostar Pen 15 units daily at bedtime  Supply/Referral recommendations: Pen needles - standard   Thanks,  Christena Deem RN, MSN, BC-ADM Inpatient Diabetes Coordinator Team Pager [phone] (8a-5p)

## 2023-01-29 NOTE — Plan of Care (Signed)
  Problem: Education: Goal: Knowledge of cardiac device and self-care will improve Outcome: Progressing Goal: Ability to safely manage health related needs after discharge will improve Outcome: Progressing Goal: Individualized Educational Video(s) Outcome: Progressing   Problem: Cardiac: Goal: Ability to achieve and maintain adequate cardiopulmonary perfusion will improve Outcome: Progressing   Problem: Education: Goal: Knowledge of disease or condition will improve Outcome: Progressing Goal: Knowledge of secondary prevention will improve (MUST DOCUMENT ALL) Outcome: Progressing Goal: Knowledge of patient specific risk factors will improve Loraine Leriche N/A or DELETE if not current risk factor) Outcome: Progressing   Problem: Ischemic Stroke/TIA Tissue Perfusion: Goal: Complications of ischemic stroke/TIA will be minimized Outcome: Progressing   Problem: Coping: Goal: Will verbalize positive feelings about self Outcome: Progressing Goal: Will identify appropriate support needs Outcome: Progressing   Problem: Health Behavior/Discharge Planning: Goal: Ability to manage health-related needs will improve Outcome: Progressing Goal: Goals will be collaboratively established with patient/family Outcome: Progressing   Problem: Self-Care: Goal: Ability to participate in self-care as condition permits will improve Outcome: Progressing Goal: Verbalization of feelings and concerns over difficulty with self-care will improve Outcome: Progressing Goal: Ability to communicate needs accurately will improve Outcome: Progressing   Problem: Nutrition: Goal: Risk of aspiration will decrease Outcome: Progressing Goal: Dietary intake will improve Outcome: Progressing   Problem: Education: Goal: Ability to describe self-care measures that may prevent or decrease complications (Diabetes Survival Skills Education) will improve Outcome: Progressing Goal: Individualized Educational  Video(s) Outcome: Progressing   Problem: Coping: Goal: Ability to adjust to condition or change in health will improve Outcome: Progressing   Problem: Fluid Volume: Goal: Ability to maintain a balanced intake and output will improve Outcome: Progressing   Problem: Health Behavior/Discharge Planning: Goal: Ability to identify and utilize available resources and services will improve Outcome: Progressing Goal: Ability to manage health-related needs will improve Outcome: Progressing   Problem: Metabolic: Goal: Ability to maintain appropriate glucose levels will improve Outcome: Progressing   Problem: Nutritional: Goal: Maintenance of adequate nutrition will improve Outcome: Progressing Goal: Progress toward achieving an optimal weight will improve Outcome: Progressing   Problem: Skin Integrity: Goal: Risk for impaired skin integrity will decrease Outcome: Progressing   Problem: Tissue Perfusion: Goal: Adequacy of tissue perfusion will improve Outcome: Progressing   Problem: Education: Goal: Knowledge of General Education information will improve Description: Including pain rating scale, medication(s)/side effects and non-pharmacologic comfort measures Outcome: Progressing   Problem: Health Behavior/Discharge Planning: Goal: Ability to manage health-related needs will improve Outcome: Progressing   Problem: Clinical Measurements: Goal: Ability to maintain clinical measurements within normal limits will improve Outcome: Progressing Goal: Will remain free from infection Outcome: Progressing Goal: Diagnostic test results will improve Outcome: Progressing Goal: Respiratory complications will improve Outcome: Progressing Goal: Cardiovascular complication will be avoided Outcome: Progressing   Problem: Activity: Goal: Risk for activity intolerance will decrease Outcome: Progressing   Problem: Nutrition: Goal: Adequate nutrition will be maintained Outcome:  Progressing   Problem: Coping: Goal: Level of anxiety will decrease Outcome: Progressing   Problem: Elimination: Goal: Will not experience complications related to bowel motility Outcome: Progressing Goal: Will not experience complications related to urinary retention Outcome: Progressing   Problem: Pain Managment: Goal: General experience of comfort will improve Outcome: Progressing   Problem: Safety: Goal: Ability to remain free from injury will improve Outcome: Progressing   Problem: Skin Integrity: Goal: Risk for impaired skin integrity will decrease Outcome: Progressing

## 2023-01-29 NOTE — TOC Initial Note (Signed)
Transition of Care [facility]) - Initial/Assessment Note    Patient Details  Name: Erik Black MRN: [id number] Date of Birth: 05/27/1951  [facility]) CM/SW Contact:    Leone Haven, RN Phone Number: 01/29/2023, 1:34 PM  Clinical Narrative:                 From home with wife, indep pta, has PCP, Dr. Earna Coder, has insurance on file.  He has no HH  in place at this time. He does have a three prong cane and a walker at home, but he does not use them to ambulate.  His wife will transport him home at dc she is also his support system. TOC to fill meds.  Expected Discharge Plan: Home/Self Care Barriers to Discharge: No Barriers Identified   Patient Goals and CMS Choice Patient states their goals for this hospitalization and ongoing recovery are:: return home   Choice offered to / list presented to : NA      Expected Discharge Plan and Services In-house Referral: NA Discharge Planning Services: CM Consult Post Acute Care Choice: NA Living arrangements for the past 2 months: Single Family Home Expected Discharge Date: 01/29/23               DME Arranged: N/A DME Agency: NA       HH Arranged: NA          Prior Living Arrangements/Services Living arrangements for the past 2 months: Single Family Home Lives with:: Spouse Patient language and need for interpreter reviewed:: Yes Do you feel safe going back to the place where you live?: Yes      Need for Family Participation in Patient Care: Yes (Comment) Care giver support system in place?: Yes (comment) Current home services: DME (walker, three prong cane) Criminal Activity/Legal Involvement Pertinent to Current Situation/Hospitalization: No - Comment as needed  Activities of Daily Living Home Assistive Devices/Equipment: None ADL Screening (condition at time of admission) Patient's cognitive ability adequate to safely complete daily activities?: Yes Is the patient deaf or have difficulty hearing?:  No Does the patient have difficulty seeing, even when wearing glasses/contacts?: No Does the patient have difficulty concentrating, remembering, or making decisions?: No Patient able to express need for assistance with ADLs?: No Does the patient have difficulty dressing or bathing?: No Independently performs ADLs?: Yes (appropriate for developmental age) Does the patient have difficulty walking or climbing stairs?: No Weakness of Legs: None Weakness of Arms/Hands: None  Permission Sought/Granted Permission sought to share information with : Case Manager Permission granted to share information with : Yes, Verbal Permission Granted              Emotional Assessment       Orientation: : Oriented to Self, Oriented to Place, Oriented to  Time, Oriented to Situation Alcohol / Substance Use: Not Applicable Psych Involvement: No (comment)  Admission diagnosis:  ICD (implantable cardioverter-defibrillator) in place [Z95.810] Patient Active Problem List   Diagnosis Date Noted   ICD (implantable cardioverter-defibrillator) in place 01/28/2023   Acute CVA (cerebrovascular accident) (HCC) 01/22/2023   Essential hypertension 01/22/2023   Dyslipidemia 01/22/2023   Type 2 diabetes mellitus with peripheral neuropathy (HCC) 01/22/2023   Chest pain 01/22/2023   PVC (premature ventricular contraction) 06/03/2022   Acute on chronic systolic CHF (congestive heart failure) (HCC) 06/01/2022   Stage 3 chronic kidney disease (HCC) 06/01/2022   Chest pain of uncertain etiology 06/01/2022   Positive D dimer 06/01/2022   Encounter for education  about heart failure 05/05/2022   HFrEF (heart failure with reduced ejection fraction) (HCC) 05/03/2022   Acute on chronic combined systolic and diastolic CHF (congestive heart failure) (HCC) 05/03/2022   Type II diabetes mellitus with renal manifestations (HCC) 05/03/2022   Sleep apnea 05/03/2022   Hypertension 05/03/2022   Depression with anxiety 05/03/2022    HLD (hyperlipidemia) 05/03/2022   CAD in native artery 05/03/2022   Morbid obesity (HCC) 05/03/2022   Hypokalemia 05/03/2022   Status post total replacement of left hip 11/23/2019   Primary osteoarthritis of left hip 10/22/2019   Status post total replacement of right hip 05/11/2019   PCP:  [facility]:   [facility] [address], [place] - [address] [place] [address] Phone: [phone] Fax: [phone]  [facility] - [place], [place] - [address] [place] [address] Phone: [phone] Fax: [phone]  [facility] [address] [place] [address] Phone: [phone] Fax: [phone]     Social Determinants of Health (SDOH) Social History: SDOH Screenings   Food Insecurity: No Food Insecurity (01/29/2023)  Housing: Patient Declined (01/29/2023)  Transportation Needs: No Transportation Needs (01/29/2023)  Utilities: Not At Risk (01/29/2023)  Depression (PHQ2-9): Low Risk  (03/09/2022)  Social Connections: Unknown (11/14/2021)   Received from [facility]  Tobacco Use: Medium Risk (01/29/2023)   SDOH Interventions:     Readmission Risk Interventions     No data to display

## 2023-01-29 NOTE — TOC Transition Note (Signed)
Transition of Care [facility]) - CM/SW Discharge Note   Patient Details  Name: Erik Black MRN: [id number] Date of Birth: [birth year]  Transition of Care [facility]) CM/SW Contact:  Leone Haven, RN Phone Number: 01/29/2023, 1:37 PM   Clinical Narrative:    For dc today, he has no needs. Wife to transport home.   Final next level of care: Home/Self Care Barriers to Discharge: No Barriers Identified   Patient Goals and CMS Choice   Choice offered to / list presented to : NA  Discharge Placement                         Discharge Plan and Services Additional resources added to the After Visit Summary for   In-house Referral: NA Discharge Planning Services: CM Consult Post Acute Care Choice: NA          DME Arranged: N/A DME Agency: NA       HH Arranged: NA          Social Determinants of Health (SDOH) Interventions SDOH Screenings   Food Insecurity: No Food Insecurity (01/29/2023)  Housing: Patient Declined (01/29/2023)  Transportation Needs: No Transportation Needs (01/29/2023)  Utilities: Not At Risk (01/29/2023)  Depression (PHQ2-9): Low Risk  (03/09/2022)  Social Connections: Unknown (11/14/2021)   Received from [facility]  Tobacco Use: Medium Risk (01/29/2023)     Readmission Risk Interventions     No data to display

## 2023-01-29 NOTE — Discharge Summary (Signed)
ELECTROPHYSIOLOGY PROCEDURE DISCHARGE SUMMARY    Patient ID: Erik Black,  MRN: [id number], DOB/AGE: 07/22/1950 72 y.o.  Admit date: 01/28/2023 Discharge date: 01/29/2023  Primary Care Physician: [facility]  Primary Cardiologist: Debbe Odea, MD  Electrophysiologist: Dr. Lalla Brothers    Primary Diagnosis:  Chronic systolic CHF  NICM  Secondary Diagnosis: Type 2 Diabetes  Allergies  Allergen Reactions   Empagliflozin Other (See Comments)    Yeast infection  Other Reaction(s): Bleeding skin   Methadone    Oxycodone Itching    Hands started peeling and "could not swallow"   Lisinopril Cough     Procedures This Admission:  1.  Implantation of a Medtronic single chamber ICD on 01/28/2023 by Dr. Lalla Brothers.  The patient received a Medtronic Cobalt XT VR [id number] with Medtronic Sprint Quattro Secure [id number] right ventricular lead. DFTs were deferred at time of implant There were no post procedure complications 2.  CXR on 01/29/2023 demonstrated no pneumothorax status post device implantation.  Brief HPI: Erik Black is a 72 y.o. male was referred to electrophysiology in the outpatient setting  for consideration of ICD implantation.  Past medical history includes above.  The patient has persistent LV dysfunction despite guideline directed therapy.  Risks, benefits, and alternatives to ICD implantation were reviewed with the patient who wished to proceed.   Hospital Course:  The patient was admitted and underwent implantation of a Medtronic single chamber ICD with details as outlined above. They were monitored on telemetry overnight which demonstrated NSR .  Left chest was without hematoma or ecchymosis.  The device was interrogated and found to be functioning normally.  CXR was obtained and demonstrated no pneumothorax status post device implantation..  Wound care, arm mobility, and restrictions were reviewed with the patient.  The patient was examined and  considered stable for discharge to home.   The patient's discharge medications include an ACE-I/ARB/ARNI Sherryll Burger) and beta blocker (Coreg).   Worked with DM coordinator to order correct dose and items pt needed for home insulin. Stressed importance of close PCP follow up.   Anticoagulation resumption the patient should resume their ASA on Sunday, Aug 4.   Physical Exam: Vitals:   01/29/23 0600 01/29/23 0755 01/29/23 0835 01/29/23 0838  BP: 126/82 (!) 145/81    Pulse:   72   Resp: 15 17 17    Temp:  97.8 F (36.6 C)    TempSrc:  Oral    SpO2:   97% 97%  Weight:      Height:        GEN- NAD. A&O x 3.  HEENT: Normocephalic, atraumatic Lungs- CTAB, normal effort.  Heart- RRR. No M/G/R.  GI- Soft, NT, ND.  Extremities- No clubbing, cyanosis, or edema Skin- Warm and dry, no rash or lesion. ICD site stable.  Discharge Medications:  Allergies as of 01/29/2023       Reactions   Empagliflozin Other (See Comments)   Yeast infection Other Reaction(s): Bleeding skin   Methadone    Oxycodone Itching   Hands started peeling and "could not swallow"   Lisinopril Cough        Medication List     STOP taking these medications    insulin glargine-yfgn 100 UNIT/ML injection Commonly known as: SEMGLEE       TAKE these medications    amLODipine 10 MG tablet Commonly known as: NORVASC Take 10 mg by mouth daily.   aspirin EC 81 MG tablet Take 1 tablet (81 mg total) by  mouth 2 (two) times daily. Start taking on: February 03, 2023 What changed: These instructions start on February 03, 2023. If you are unsure what to do until then, ask your doctor or other care provider.   atorvastatin 40 MG tablet Commonly known as: LIPITOR Take 1 tablet (40 mg total) by mouth at bedtime.   Breztri Aerosphere 160-9-4.8 MCG/ACT Aero Generic drug: Budeson-Glycopyrrol-Formoterol Inhale 1 puff into the lungs 2 (two) times daily.   Brinzolamide-Brimonidine 1-0.2 % Susp Apply 1 drop to eye 3  (three) times daily.   carvedilol 25 MG tablet Commonly known as: COREG TAKE 1 TABLET BY MOUTH TWICE A DAY   docusate sodium 100 MG capsule Commonly known as: COLACE Take 100 mg by mouth daily as needed.   DULoxetine 60 MG capsule Commonly known as: CYMBALTA Take 60 mg by mouth daily.   insulin glargine 100 UNIT/ML Solostar Pen Commonly known as: LANTUS Inject 15 Units into the skin at bedtime.   latanoprost 0.005 % ophthalmic solution Commonly known as: XALATAN Place 1 drop into both eyes at bedtime.   mexiletine 200 MG capsule Commonly known as: MEXITIL TAKE 1 CAPSULE BY MOUTH TWICE A DAY   multivitamin with minerals tablet Take 1 tablet by mouth daily.   Omega-3 1000 MG Caps Take 2,000 mg by mouth 3 (three) times daily after meals.   omeprazole 20 MG capsule Commonly known as: PRILOSEC Take 20 mg by mouth 2 (two) times daily.   OXcarbazepine 150 MG tablet Commonly known as: TRILEPTAL Take 150 mg by mouth 2 (two) times daily.   Pen Needles 3/16" 31G X 5 MM Misc 1 Needle by Does not apply route at bedtime. For use with Solostar insulin pen.   sacubitril-valsartan 97-103 MG Commonly known as: ENTRESTO Take 1 tablet by mouth 2 (two) times daily.   Semaglutide(0.25 or 0.5MG /DOS) 2 MG/3ML Sopn Inject 0.5 mg into the skin once a week.   spironolactone 25 MG tablet Commonly known as: ALDACTONE TAKE 1 TABLET (25 MG TOTAL) BY MOUTH DAILY.   timolol 0.5 % ophthalmic gel-forming Commonly known as: TIMOPTIC-XR Place 1 drop into both eyes daily.   torsemide 20 MG tablet Commonly known as: DEMADEX TAKE 2 TABLETS (40 MG TOTAL) BY MOUTH DAILY.   Vitamin D 50 MCG (2000 UT) tablet Take 4,000 Units by mouth daily.        Disposition:    Follow-up Information     [facility] Follow up.   Specialty: General Practice Why: Please follow up in a week. Contact information: [address] [place] [address] [phone]         [facility]  [facility] Follow up.   Specialty: Cardiology Why: on 8/14 at 2 pm for post ICD wound check Contact information: [address], [address] [place] [address] [phone]                Duration of Discharge Encounter: Greater than 30 minutes including physician time.  Dustin Flock, PA-C  01/29/2023 12:54 PM

## 2023-01-31 ENCOUNTER — Encounter (INDEPENDENT_AMBULATORY_CARE_PROVIDER_SITE_OTHER): Payer: Self-pay | Admitting: Family Medicine

## 2023-02-08 ENCOUNTER — Telehealth: Payer: Self-pay | Admitting: Pharmacist

## 2023-02-08 NOTE — Telephone Encounter (Signed)
Called pt to follow up with Ozempic tolerability, left message. Will plan to increase dose to 1mg  weekly for next month if tolerating well. Will need update on glucose readings as well.

## 2023-02-12 MED ORDER — SEMAGLUTIDE(0.25 OR 0.5MG/DOS) 2 MG/3ML ~~LOC~~ SOPN: 0.5000 mg | PEN_INJECTOR | SUBCUTANEOUS | 0 refills | Status: DC

## 2023-02-12 NOTE — Telephone Encounter (Signed)
Called pt again, no answer, left another message

## 2023-02-12 NOTE — Telephone Encounter (Signed)
Called pt back 5 mins after his call, no answer again, left 3rd message. I am also leaving him my direct line and he recently called in to the main line.

## 2023-02-12 NOTE — Telephone Encounter (Signed)
Patient is returning call.  °

## 2023-02-12 NOTE — Telephone Encounter (Signed)
Spoke with pt. He states he was started on Lantus when he was in the hospital recently. His A1c was 10.5%. States he hasn't been on his Ozempic for a month, unclear issues at his pharmacy. Reports glucose readings are now down < 200. He did not have any side effects when he was on the 0.25 or 0.5mg  dose. Will restart at the 0.5mg  dose and skip the 0.25mg  dose based on his recent A1c and previous med tolerability. I will call him in another month for dose increase.

## 2023-02-13 ENCOUNTER — Ambulatory Visit: Payer: Medicare HMO

## 2023-02-13 DIAGNOSIS — I5023 Acute on chronic systolic (congestive) heart failure: Secondary | ICD-10-CM | POA: Diagnosis not present

## 2023-02-13 LAB — CUP PACEART INCLINIC DEVICE CHECK
Date Time Interrogation Session: 20240814142834
Implantable Lead Connection Status: 753985
Implantable Lead Implant Date: 20240729
Implantable Lead Location: 753860
Implantable Pulse Generator Implant Date: 20240729

## 2023-02-13 NOTE — Progress Notes (Signed)

## 2023-02-13 NOTE — Patient Instructions (Signed)

## 2023-02-26 ENCOUNTER — Encounter (INDEPENDENT_AMBULATORY_CARE_PROVIDER_SITE_OTHER): Payer: Self-pay | Admitting: Physician Assistant

## 2023-02-26 ENCOUNTER — Ambulatory Visit: Payer: Medicare HMO | Admitting: Internal Medicine

## 2023-02-26 VITALS — BP 105/68 | HR 77 | Ht 72.0 in | Wt 275.0 lb

## 2023-02-26 DIAGNOSIS — I10 Essential (primary) hypertension: Secondary | ICD-10-CM | POA: Diagnosis not present

## 2023-02-26 DIAGNOSIS — G4733 Obstructive sleep apnea (adult) (pediatric): Secondary | ICD-10-CM

## 2023-02-26 DIAGNOSIS — I5022 Chronic systolic (congestive) heart failure: Secondary | ICD-10-CM | POA: Diagnosis not present

## 2023-02-26 NOTE — Progress Notes (Signed)
ADVANCED HF CLINIC NOTE  Referring Physician: Debbe Odea, MD  Primary Care: [facility] Primary Cardiologist: Debbe Odea, MD  HPI:  Erik Black is a 72 y.o. male with morbid obesity, HTN, OSA on CPAP, DM2 HFrEF EF 30 to 35%, diabetes, former smoker  Diagnosed with HF in 9/23. Echo 9/23 EF 30-35%. Started lasix. Referred to [facility]   Admitted 11/23 with ADHF cath as below. Treated with IV lasix and GDMT titrated. Diuresed 22 pounds.   Cath: 05/03/22  LAD 60%d LCx 55% prox RCA 30%m  RA 18 RV 68/15 PA 69/39 (41) PCWP 40  Fick 5.6/2.3   cMRI: 11/23 EF 25% mid wall LGE strip c/w NICM. RV moderately down.   Zio 11/23: Occasional NSVT 13.7% PVCs (one predominant morphology 13.6%)  Echo 3/24: EF 20-25% RV ok  Echo 7/24; EF 25-30% RV ok   Zio 6/24 Sinus. 2.1% PVCs (suppressed on mexilitene)  Underwent MDT ICD placement 01/28/23  Here for f/u with his wife. Feels good. ICD site has healed. Breathing ok. Still gets tired easily. No CP. Compliant with meds.    FHx - Mother with HF - 66 Brothers with HF - 1 with ICD - Father with MI     Past Medical History:  Diagnosis Date   Anxiety    Arthritis    CHF (congestive heart failure) (HCC)    Depression    Diabetes mellitus without complication (HCC)    GERD (gastroesophageal reflux disease)    Glaucoma    both eyes   Hypertension    Sleep apnea    uses Cpap nightly    Current Outpatient Medications  Medication Sig Dispense Refill   amLODipine (NORVASC) 10 MG tablet Take 10 mg by mouth daily.     aspirin EC 81 MG tablet Take 1 tablet (81 mg total) by mouth 2 (two) times daily for 6 weeks, then as directed by provider. 120 tablet 0   atorvastatin (LIPITOR) 40 MG tablet Take 1 tablet (40 mg total) by mouth at bedtime. 90 tablet 3   BREZTRI AEROSPHERE 160-9-4.8 MCG/ACT AERO Inhale 1 puff into the lungs 2 (two) times daily.     Brinzolamide-Brimonidine 1-0.2 % SUSP Apply 1 drop to eye 3  (three) times daily.     carvedilol (COREG) 25 MG tablet TAKE 1 TABLET BY MOUTH TWICE A DAY 180 tablet 1   Cholecalciferol (VITAMIN D) 50 MCG (2000 UT) tablet Take 4,000 Units by mouth daily.      docusate sodium (COLACE) 100 MG capsule Take 100 mg by mouth daily as needed.     DULoxetine (CYMBALTA) 60 MG capsule Take 60 mg by mouth daily.     insulin glargine (LANTUS) 100 UNIT/ML Solostar Pen Inject 15 Units into the skin at bedtime. 15 mL 1   Insulin Pen Needle 32G X 4 MM MISC Use at bedtime. 100 each 1   latanoprost (XALATAN) 0.005 % ophthalmic solution Place 1 drop into both eyes at bedtime.     mexiletine (MEXITIL) 200 MG capsule TAKE 1 CAPSULE BY MOUTH TWICE A DAY 90 capsule 3   Multiple Vitamins-Minerals (MULTIVITAMIN WITH MINERALS) tablet Take 1 tablet by mouth daily.     Omega-3 1000 MG CAPS Take 2,000 mg by mouth 3 (three) times daily after meals.     omeprazole (PRILOSEC) 20 MG capsule Take 20 mg by mouth 2 (two) times daily.     OXcarbazepine (TRILEPTAL) 150 MG tablet Take 150 mg by mouth 2 (two) times daily.  sacubitril-valsartan (ENTRESTO) 97-103 MG Take 1 tablet by mouth 2 (two) times daily. 60 tablet 6   Semaglutide,0.25 or 0.5MG /DOS, 2 MG/3ML SOPN Inject 0.5 mg into the skin once a week. 3 mL 0   spironolactone (ALDACTONE) 25 MG tablet TAKE 1 TABLET (25 MG TOTAL) BY MOUTH DAILY. 90 tablet 2   timolol (TIMOPTIC-XR) 0.5 % ophthalmic gel-forming Place 1 drop into both eyes daily.     torsemide (DEMADEX) 20 MG tablet TAKE 2 TABLETS (40 MG TOTAL) BY MOUTH DAILY. 180 tablet 1   No current facility-administered medications for this visit.    Allergies  Allergen Reactions   Empagliflozin Other (See Comments)    Yeast infection  Other Reaction(s): Bleeding skin   Methadone    Oxycodone Itching    Hands started peeling and "could not swallow"   Lisinopril Cough      Social History   Socioeconomic History   Marital status: Married    Spouse name: [name]   Number of  children: 0   Years of education: Not on file   Highest education level: Not on file  Occupational History   Not on file  Tobacco Use   Smoking status: Former    Current packs/day: 0.00    Types: Cigarettes    Quit date: 05/26/2019    Years since quitting: 3.7   Smokeless tobacco: Never   Tobacco comments:    smokes a pack a month  Vaping Use   Vaping status: Never Used  Substance and Sexual Activity   Alcohol use: Not Currently   Drug use: Not Currently   Sexual activity: Not on file  Other Topics Concern   Not on file  Social History Narrative   Lives at home with wife [name] and a dog.   Social Determinants of Health   Financial Resource Strain: Not on file  Food Insecurity: No Food Insecurity (01/29/2023)   Hunger Vital Sign    Worried About Running Out of Food in the Last Year: Never true    Ran Out of Food in the Last Year: Never true  Transportation Needs: No Transportation Needs (01/29/2023)   PRAPARE - Administrator, Civil Service (Medical): No    Lack of Transportation (Non-Medical): No  Physical Activity: Not on file  Stress: Not on file  Social Connections: Unknown (11/14/2021)   [facility]   Social Network    Social Network: Not on file  Intimate Partner Violence: Not At Risk (01/29/2023)   Humiliation, Afraid, Rape, and Kick questionnaire    Fear of Current or Ex-Partner: No    Emotionally Abused: No    Physically Abused: No    Sexually Abused: No      Family History  Problem Relation Age of Onset   Hypertension Mother    Diabetes Mother    Heart disease Mother    Heart disease Father    Heart disease Brother     Vitals:   02/26/23 1005  BP: 105/68  Pulse: 77  SpO2: 100%  Weight: 275 lb (124.7 kg)  Height: 6' (1.829 m)   Wt Readings from Last 3 Encounters:  02/26/23 275 lb (124.7 kg)  01/29/23 276 lb 14.4 oz (125.6 kg)  01/22/23 274 lb 7.6 oz (124.5 kg)     PHYSICAL EXAM: General:  Well appearing. No  resp difficulty HEENT: normal Neck: supple. no JVD. Carotids 2+ bilat; no bruits. No lymphadenopathy or thryomegaly appreciated. Cor: PMI nondisplaced. Regular rate & rhythm. No  rubs, gallops or murmurs. ICD site ok Lungs: clear Abdomen: obese soft, nontender, nondistended. No hepatosplenomegaly. No bruits or masses. Good bowel sounds. Extremities: no cyanosis, clubbing, rash, edema Neuro: alert & orientedx3, cranial nerves grossly intact. moves all 4 extremities w/o difficulty. Affect pleasant  ASSESSMENT & PLAN:   1. Chronic systolic HF - Echocardiogram 03/2022 EF 30 to 35% - Cath 11/23 non-obstructive CAD - Admit 11/23 with marked volume overload.  - cMRI: 11/23 EF 25% mid wall LGE strip c/w NICM. RV moderately down.  - Zio 11/23: Occasional NSVT 13.7% PVCs (one predominant morphology 13.6%) - Echo 3/24: EF 20-25% RV ok  - Echo 7/24; EF 25-30% RV ok  - Underwent MDT ICD placement 01/28/23 - Suspect PVC CM vs familial (LMNA)  No evidence of infiltrative process on MRI  - EF has not improved with PVC suppression with mexilitene. He has met with Dr. Jomarie Longs in [place] who agrees that Fhx is concerning for potential genetic CM however he has no first-degree relatives that would benefit from screening so has decided not to pursue - Stable NYHA II-III - Volume status looks good on torsemide 40 daily - Continue Entresto to 97/103 bid - Continue carvedilol 25 bid - Continue spiro 25 daily - Patient did not tolerate Jardiance in the past, developed yeast infection - Can consider CCM or Barostim (versus VAD) down the road if symptoms progress  2. Frequent PVCs - Zio 11/23: Occasional NSVT 13.7% PVCs (one predominant morphology 13.6%) - Continue mexilitene 200 bid for suppression  - Zio 6/24 Sinus. 2.1% PVCs (suppressed on mexilitene)  3. HTN - Blood pressure well controlled. Continue current regimen.  4. OSA - compliant with CPAP  5. Morbid obesity  - continue GLP-1 RA - Body mass  index is 37.3 kg/m.  Arvilla Meres, MD  10:19 AM

## 2023-02-27 DIAGNOSIS — Z0289 Encounter for other administrative examinations: Secondary | ICD-10-CM

## 2023-03-05 ENCOUNTER — Encounter (INDEPENDENT_AMBULATORY_CARE_PROVIDER_SITE_OTHER): Payer: Self-pay | Admitting: Physician Assistant

## 2023-03-05 ENCOUNTER — Ambulatory Visit (INDEPENDENT_AMBULATORY_CARE_PROVIDER_SITE_OTHER): Payer: Medicare HMO | Admitting: Physician Assistant

## 2023-03-05 VITALS — BP 110/71 | HR 76 | Temp 97.7°F | Ht 70.0 in | Wt 272.0 lb

## 2023-03-05 DIAGNOSIS — I2510 Atherosclerotic heart disease of native coronary artery without angina pectoris: Secondary | ICD-10-CM

## 2023-03-05 DIAGNOSIS — G4730 Sleep apnea, unspecified: Secondary | ICD-10-CM

## 2023-03-05 DIAGNOSIS — E785 Hyperlipidemia, unspecified: Secondary | ICD-10-CM | POA: Diagnosis not present

## 2023-03-05 DIAGNOSIS — E1142 Type 2 diabetes mellitus with diabetic polyneuropathy: Secondary | ICD-10-CM | POA: Diagnosis not present

## 2023-03-05 DIAGNOSIS — I5020 Unspecified systolic (congestive) heart failure: Secondary | ICD-10-CM | POA: Diagnosis not present

## 2023-03-05 DIAGNOSIS — Z95810 Presence of automatic (implantable) cardiac defibrillator: Secondary | ICD-10-CM

## 2023-03-05 DIAGNOSIS — Z6839 Body mass index (BMI) 39.0-39.9, adult: Secondary | ICD-10-CM

## 2023-03-05 DIAGNOSIS — Z96642 Presence of left artificial hip joint: Secondary | ICD-10-CM

## 2023-03-05 DIAGNOSIS — Z96641 Presence of right artificial hip joint: Secondary | ICD-10-CM

## 2023-03-05 NOTE — Progress Notes (Signed)
Office: [phone]  /  Fax: [phone]   Initial Visit  Erik Black was seen in clinic today to evaluate for obesity. He is interested in losing weight to improve overall health and reduce the risk of weight related complications. He presents today to review program treatment options, initial physical assessment, and evaluation.     He was referred by: Specialist  When asked what else they would like to accomplish? He states: Adopt healthier eating patterns, Improve energy levels and physical activity, Improve existing medical conditions, Reduce number of medications, Reduce risk for a surgery, Improve quality of life, and Improve appearance  Weight history: Always   When asked how has your weight affected you? He states: Relationships, Contributed to medical problems, Contributed to orthopedic problems or mobility issues, Having fatigue, Having poor endurance, and Has affected mood   Some associated conditions: Hypertension, Arthritis:Knee replacements/ hips replacements, Hyperlipidemia, Diabetes, Heart disease, Kidney disease, and Vitamin D Deficiency  Contributing factors: Family history, Disruption of circadian rhythm, Medications, Stress, Reduced physical activity, and Mental health problems PTSD  Weight promoting medications identified: Psychotropic medications  Current nutrition plan: Portion control / smart choices  Current level of physical activity: Limited due to chronic pain or orthopedic problems and Other: Heart condition  Current or previous pharmacotherapy: GLP-1  Response to medication:  Cardiology started Ozempic and doing well thus far.    Past medical history includes:   Past Medical History:  Diagnosis Date   Anxiety    Arthritis    CHF (congestive heart failure) (HCC)    Depression    Diabetes mellitus without complication (HCC)    GERD (gastroesophageal reflux disease)    Glaucoma    both eyes   Hypertension    Sleep apnea    uses Cpap  nightly     Objective:   BP 110/71   Pulse 76   Temp 97.7 F (36.5 C)   Ht 5\' 10"  (1.778 m)   Wt 272 lb (123.4 kg)   SpO2 98%   BMI 39.03 kg/m  He was weighed on the bioimpedance scale: Body mass index is 39.03 kg/m.  Peak Weight:298 lbs , Body Fat%:39.5%, Visceral Fat Rating:26, Weight trend over the last 12 months: Decreasing  General:  Alert, oriented and cooperative. Patient is in no acute distress.  Respiratory: Normal respiratory effort, no problems with respiration noted   Gait: able to ambulate independently  Mental Status: Normal mood and affect. Normal behavior. Normal judgment and thought content.   DIAGNOSTIC DATA REVIEWED:  BMET    Component Value Date/Time   NA 137 01/25/2023 0927   K 4.2 01/25/2023 0927   CL 104 01/25/2023 0927   CO2 24 01/25/2023 0927   GLUCOSE 148 (H) 01/25/2023 0927   BUN 18 01/25/2023 0927   CREATININE 1.24 01/25/2023 0927   CALCIUM 9.7 01/25/2023 0927   GFRNONAA >60 01/25/2023 0927   GFRAA 59 (L) 11/24/2019 0628   Lab Results  Component Value Date   HGBA1C 10.5 (H) 01/22/2023   HGBA1C 6.1 (H) 04/16/2019   No results found for: "INSULIN" CBC    Component Value Date/Time   WBC 5.8 01/23/2023 0526   RBC 4.72 01/23/2023 0526   HGB 13.9 01/23/2023 0526   HCT 42.4 01/23/2023 0526   PLT 144 (L) 01/23/2023 0526   MCV 89.8 01/23/2023 0526   MCH 29.4 01/23/2023 0526   MCHC 32.8 01/23/2023 0526   RDW 12.5 01/23/2023 0526   Iron/TIBC/Ferritin/ %Sat No results found for: "IRON", "TIBC", "  FERRITIN", "IRONPCTSAT" Lipid Panel     Component Value Date/Time   CHOL 152 01/23/2023 0526   TRIG 134 01/23/2023 0526   HDL 34 (L) 01/23/2023 0526   CHOLHDL 4.5 01/23/2023 0526   VLDL 27 01/23/2023 0526   LDLCALC 91 01/23/2023 0526   Hepatic Function Panel     Component Value Date/Time   PROT 7.2 01/22/2023 0935   ALBUMIN 4.1 01/22/2023 0935   AST 20 01/22/2023 0935   ALT 15 01/22/2023 0935   ALKPHOS 120 01/22/2023 0935   BILITOT  0.8 01/22/2023 0935      Component Value Date/Time   TSH 0.774 12/05/2022 1147     Assessment and Plan:   Type 2 diabetes mellitus with peripheral neuropathy (HCC)  HFrEF (heart failure with reduced ejection fraction) (HCC)  Hyperlipidemia, unspecified hyperlipidemia type  CAD in native artery  Status post total replacement of right hip  Status post total replacement of left hip  ICD (implantable cardioverter-defibrillator) in place  Sleep apnea, unspecified type  Class 2 severe obesity due to excess calories with serious comorbidity and body mass index (BMI) of 39.0 to 39.9 in adult [facility])        Obesity Treatment / Action Plan:  Patient will work on garnering support from family and friends to begin weight loss journey. Will work on eliminating or reducing the presence of highly palatable, calorie dense foods in the home. Will complete provided nutritional and psychosocial assessment questionnaire before the next appointment. Will be scheduled for indirect calorimetry to determine resting energy expenditure in a fasting state.  This will allow [place] to create a reduced calorie, high-protein meal plan to promote loss of fat mass while preserving muscle mass. Will work on reducing intake of added sugars, simple sugars and processed carbs. Will avoid skipping meals which may result in increased hunger signals and overeating at certain times. Will work on managing stress via relaxation methods as this may result in unhealthy eating patterns. Will work on reading labels, making healthier choices and watching portion sizes. Counseled on the health benefits of losing 5%-15% of total body weight. Was counseled on nutritional approaches to weight loss and benefits of reducing processed foods and consuming plant-based foods and high quality protein as part of nutritional weight management. Was counseled on pharmacotherapy and role as an adjunct in weight management.   Obesity  Education Performed Today:  He was weighed on the bioimpedance scale and results were discussed and documented in the synopsis.  We discussed obesity as a disease and the importance of a more detailed evaluation of all the factors contributing to the disease.  We discussed the importance of long term lifestyle changes which include nutrition, exercise and behavioral modifications as well as the importance of customizing this to his specific health and social needs.  We discussed the benefits of reaching a healthier weight to alleviate the symptoms of existing conditions and reduce the risks of the biomechanical, metabolic and psychological effects of obesity.  [place] Skyberg appears to be in the action stage of change and states they are ready to start intensive lifestyle modifications and behavioral modifications.  30 minutes was spent today on this visit including the above counseling, pre-visit chart review, and post-visit documentation.  Reviewed by clinician on day of visit: allergies, medications, problem list, medical history, surgical history, family history, social history, and previous encounter notes pertinent to obesity diagnosis.  Tarrance Januszewski,PA-C

## 2023-03-12 ENCOUNTER — Telehealth: Payer: Self-pay | Admitting: Pharmacist

## 2023-03-12 MED ORDER — SEMAGLUTIDE (1 MG/DOSE) 4 MG/3ML ~~LOC~~ SOPN: 1.0000 mg | PEN_INJECTOR | SUBCUTANEOUS | 0 refills | Status: DC

## 2023-03-12 NOTE — Telephone Encounter (Signed)
Called pt to follow up with Ozempic tolerability. Just gave his last dose of 0.5mg . Tolerating med well, would like to increase dose. Rx sent in for 1mg  dose.

## 2023-04-04 ENCOUNTER — Other Ambulatory Visit: Payer: Self-pay | Admitting: Internal Medicine

## 2023-04-05 ENCOUNTER — Telehealth: Payer: Self-pay | Admitting: Pharmacist

## 2023-04-05 MED ORDER — OZEMPIC (2 MG/DOSE) 8 MG/3ML ~~LOC~~ SOPN: 2.0000 mg | PEN_INJECTOR | SUBCUTANEOUS | 11 refills | Status: DC

## 2023-04-05 NOTE — Telephone Encounter (Signed)
Received refill request for Ozempic. Called pt - he's tolerating 1mg  dose well. No side effects. Glucose steady 115-120. Appetite is less but still eating. Wishes to increase to 2mg  maintenance dose. Rx sent in with refills, advised him to call with any concerns.

## 2023-04-06 ENCOUNTER — Other Ambulatory Visit: Payer: Self-pay | Admitting: Internal Medicine

## 2023-04-14 ENCOUNTER — Emergency Department: Payer: Medicare HMO

## 2023-04-14 ENCOUNTER — Other Ambulatory Visit: Payer: Self-pay | Admitting: Internal Medicine

## 2023-04-14 ENCOUNTER — Emergency Department
Admission: EM | Admit: 2023-04-14 | Discharge: 2023-04-14 | Disposition: A | Discharge status: Other Acute Inpatient Hospital | Payer: Medicare HMO | Attending: Student in an Organized Health Care Education/Training Program | Admitting: Student in an Organized Health Care Education/Training Program

## 2023-04-14 ENCOUNTER — Other Ambulatory Visit: Payer: Self-pay

## 2023-04-14 ENCOUNTER — Observation Stay (HOSPITAL_COMMUNITY)
Admission: EM | Admit: 2023-04-14 | Discharge: 2023-04-15 | Disposition: A | Discharge status: Home / Self Care | Payer: Medicare HMO | Source: Other Acute Inpatient Hospital | Attending: Internal Medicine | Admitting: Internal Medicine

## 2023-04-14 ENCOUNTER — Encounter (HOSPITAL_COMMUNITY): Payer: Self-pay | Admitting: Internal Medicine

## 2023-04-14 ENCOUNTER — Encounter (HOSPITAL_COMMUNITY): Payer: Self-pay

## 2023-04-14 ENCOUNTER — Encounter: Payer: Self-pay | Admitting: Internal Medicine

## 2023-04-14 DIAGNOSIS — Z96643 Presence of artificial hip joint, bilateral: Secondary | ICD-10-CM | POA: Insufficient documentation

## 2023-04-14 DIAGNOSIS — Z7982 Long term (current) use of aspirin: Secondary | ICD-10-CM | POA: Diagnosis not present

## 2023-04-14 DIAGNOSIS — M25512 Pain in left shoulder: Secondary | ICD-10-CM | POA: Diagnosis not present

## 2023-04-14 DIAGNOSIS — E785 Hyperlipidemia, unspecified: Secondary | ICD-10-CM | POA: Diagnosis not present

## 2023-04-14 DIAGNOSIS — I130 Hypertensive heart and chronic kidney disease with heart failure and stage 1 through stage 4 chronic kidney disease, or unspecified chronic kidney disease: Secondary | ICD-10-CM | POA: Insufficient documentation

## 2023-04-14 DIAGNOSIS — W1830XA Fall on same level, unspecified, initial encounter: Secondary | ICD-10-CM | POA: Diagnosis not present

## 2023-04-14 DIAGNOSIS — N183 Chronic kidney disease, stage 3 unspecified: Secondary | ICD-10-CM | POA: Insufficient documentation

## 2023-04-14 DIAGNOSIS — Z96653 Presence of artificial knee joint, bilateral: Secondary | ICD-10-CM | POA: Diagnosis not present

## 2023-04-14 DIAGNOSIS — W010XXA Fall on same level from slipping, tripping and stumbling without subsequent striking against object, initial encounter: Secondary | ICD-10-CM | POA: Diagnosis not present

## 2023-04-14 DIAGNOSIS — R202 Paresthesia of skin: Secondary | ICD-10-CM | POA: Diagnosis not present

## 2023-04-14 DIAGNOSIS — I5043 Acute on chronic combined systolic (congestive) and diastolic (congestive) heart failure: Secondary | ICD-10-CM | POA: Diagnosis not present

## 2023-04-14 DIAGNOSIS — Z95810 Presence of automatic (implantable) cardiac defibrillator: Secondary | ICD-10-CM | POA: Diagnosis not present

## 2023-04-14 DIAGNOSIS — R1032 Left lower quadrant pain: Secondary | ICD-10-CM | POA: Insufficient documentation

## 2023-04-14 DIAGNOSIS — Z79899 Other long term (current) drug therapy: Secondary | ICD-10-CM | POA: Diagnosis not present

## 2023-04-14 DIAGNOSIS — M542 Cervicalgia: Secondary | ICD-10-CM | POA: Insufficient documentation

## 2023-04-14 DIAGNOSIS — E1122 Type 2 diabetes mellitus with diabetic chronic kidney disease: Secondary | ICD-10-CM | POA: Insufficient documentation

## 2023-04-14 DIAGNOSIS — Z8673 Personal history of transient ischemic attack (TIA), and cerebral infarction without residual deficits: Secondary | ICD-10-CM | POA: Insufficient documentation

## 2023-04-14 DIAGNOSIS — N179 Acute kidney failure, unspecified: Secondary | ICD-10-CM | POA: Diagnosis not present

## 2023-04-14 DIAGNOSIS — M25552 Pain in left hip: Secondary | ICD-10-CM | POA: Insufficient documentation

## 2023-04-14 DIAGNOSIS — W19XXXA Unspecified fall, initial encounter: Secondary | ICD-10-CM | POA: Insufficient documentation

## 2023-04-14 DIAGNOSIS — R200 Anesthesia of skin: Secondary | ICD-10-CM | POA: Insufficient documentation

## 2023-04-14 LAB — BASIC METABOLIC PANEL
Anion gap: 7 (ref 5–15)
BUN: 19 mg/dL (ref 8–23)
CO2: 25 mmol/L (ref 22–32)
Calcium: 9 mg/dL (ref 8.9–10.3)
Chloride: 104 mmol/L (ref 98–111)
Creatinine, Ser: 1.76 mg/dL — ABNORMAL HIGH (ref 0.61–1.24)
GFR, Estimated: 41 mL/min — ABNORMAL LOW (ref >60)
Glucose, Bld: 115 mg/dL — ABNORMAL HIGH (ref 70–99)
Potassium: 3.5 mmol/L (ref 3.5–5.1)
Sodium: 136 mmol/L (ref 135–145)

## 2023-04-14 LAB — CBC WITH DIFFERENTIAL/PLATELET
Abs Immature Granulocytes: 0.02 10*3/uL (ref 0.00–0.07)
Basophils Absolute: 0 10*3/uL (ref 0.0–0.1)
Basophils Relative: 0 %
Eosinophils Absolute: 0.1 10*3/uL (ref 0.0–0.5)
Eosinophils Relative: 1 %
HCT: 40.6 % (ref 39.0–52.0)
Hemoglobin: 13.5 g/dL (ref 13.0–17.0)
Immature Granulocytes: 0 %
Lymphocytes Relative: 22 %
Lymphs Abs: 1.4 10*3/uL (ref 0.7–4.0)
MCH: 29.1 pg (ref 26.0–34.0)
MCHC: 33.3 g/dL (ref 30.0–36.0)
MCV: 87.5 fL (ref 80.0–100.0)
Monocytes Absolute: 0.5 10*3/uL (ref 0.1–1.0)
Monocytes Relative: 8 %
Neutro Abs: 4.5 10*3/uL (ref 1.7–7.7)
Neutrophils Relative %: 69 %
Platelets: 137 10*3/uL — ABNORMAL LOW (ref 150–400)
RBC: 4.64 MIL/uL (ref 4.22–5.81)
RDW: 13 % (ref 11.5–15.5)
WBC: 6.6 10*3/uL (ref 4.0–10.5)
nRBC: 0 % (ref 0.0–0.2)

## 2023-04-14 MED ORDER — ACETAMINOPHEN 500 MG PO TABS
1000.0000 mg | ORAL_TABLET | Freq: Four times a day (QID) | ORAL | Status: DC | PRN
  Administered 2023-04-15: 1000 mg via ORAL
  Filled 2023-04-14: qty 2

## 2023-04-14 MED ORDER — METHOCARBAMOL 500 MG PO TABS
500.0000 mg | ORAL_TABLET | Freq: Three times a day (TID) | ORAL | Status: DC | PRN
  Administered 2023-04-15: 500 mg via ORAL
  Filled 2023-04-14: qty 1

## 2023-04-14 MED ORDER — IOHEXOL 300 MG/ML  SOLN
80.0000 mL | Freq: Once | INTRAMUSCULAR | Status: AC | PRN
  Administered 2023-04-14: 80 mL via INTRAVENOUS

## 2023-04-14 NOTE — ED Notes (Signed)
ED TO INPATIENT HANDOFF REPORT  ED Nurse Name and Phone #:   S Name/Age/Gender Erik Black 72 y.o. male Room/Bed: ED17A/ED17A  Code Status   Code Status: Prior  Home/SNF/Other Home Patient oriented to: self, place, time, and situation Is this baseline? Yes   Triage Complete: Triage complete  Chief Complaint fall ems  [place] leg went thru a crate. Patient c/o left lower extremety numbness, severe left hip pain, left shoulder pain. Bilateral pulses intact    Allergies Allergies  Allergen Reactions   Empagliflozin Other (See Comments)    Yeast infection  Other Reaction(s): Bleeding skin   Methadone    Oxycodone Itching    Hands started peeling and "could not swallow"   Lisinopril Cough    Level of Care/Admitting Diagnosis ED Disposition     ED Disposition  Transfer via Transport   Condition  --   Comment  The patient appears reasonably stabilized for transfer considering the current resources, flow, and capabilities available in the ED at this time, and I doubt any other [facility] requiring further screening and/or treatment in the ED prior to transfer is p resent.          B Medical/Surgery History Past Medical History:  Diagnosis Date   Acute on chronic combined systolic and diastolic CHF (congestive heart failure) (HCC) 05/03/2022   Acute on chronic systolic CHF (congestive heart failure) (HCC) 06/01/2022   Anxiety    Arthritis    Chest pain 01/22/2023   CHF (congestive heart failure) (HCC)    Depression    Diabetes mellitus without complication (HCC)    GERD (gastroesophageal reflux disease)    Glaucoma    both eyes   Hypertension    Hypokalemia 05/03/2022   Positive D dimer 06/01/2022   Sleep apnea    uses Cpap nightly   Past Surgical History:  Procedure Laterality Date   CARPAL TUNNEL RELEASE Bilateral    COLONOSCOPY     EYE SURGERY Bilateral    cataract surgery with lens implants   ICD  IMPLANT N/A 01/28/2023   Procedure: ICD IMPLANT;  Surgeon: Lanier Prude, MD;  Location: [facility] INVASIVE CV LAB;  Service: Cardiovascular;  Laterality: N/A;   KNEE ARTHROPLASTY Right    KNEE ARTHROPLASTY Left    KNEE ARTHROSCOPY Right    KNEE ARTHROSCOPY Left    MULTIPLE TOOTH EXTRACTIONS     RIGHT/LEFT HEART CATH AND CORONARY ANGIOGRAPHY Bilateral 05/03/2022   Procedure: RIGHT/LEFT HEART CATH AND CORONARY ANGIOGRAPHY;  Surgeon: Marykay Lex, MD;  Location: [facility] INVASIVE CV LAB;  Service: Cardiovascular;  Laterality: Bilateral;   ROTATOR CUFF REPAIR Right    ROTATOR CUFF REPAIR W/ DISTAL CLAVICLE EXCISION Left    TOTAL HIP ARTHROPLASTY Right 05/11/2019   Procedure: RIGHT TOTAL HIP ARTHROPLASTY ANTERIOR APPROACH;  Surgeon: Tarry Kos, MD;  Location: [facility] OR;  Service: Orthopedics;  Laterality: Right;   TOTAL HIP ARTHROPLASTY Left 11/23/2019   TOTAL HIP ARTHROPLASTY Left 11/23/2019   Procedure: LEFT TOTAL HIP ARTHROPLASTY ANTERIOR APPROACH;  Surgeon: Tarry Kos, MD;  Location: [facility] OR;  Service: Orthopedics;  Laterality: Left;   VASECTOMY       A IV Location/Drains/Wounds Patient Lines/Drains/Airways Status     Active Line/Drains/Airways     Name Placement date Placement time Site Days   Peripheral IV 04/14/23 20 G 1" Anterior;Right Forearm 04/14/23  --  Forearm  less than 1   Sheath 05/03/22 Right Arterial;Femoral 05/03/22  1445  Arterial;Femoral  346            Intake/Output Last 24 hours No intake or output data in the 24 hours ending 04/14/23 1908  Labs/Imaging Results for orders placed or performed during the hospital encounter of 04/14/23 (from the past 48 hour(s))  CBC with Differential     Status: Abnormal   Collection Time: 04/14/23  2:21 PM  Result Value Ref Range   WBC 6.6 4.0 - 10.5 K/uL   RBC 4.64 4.22 - 5.81 MIL/uL   Hemoglobin 13.5 13.0 - 17.0 g/dL   HCT 08.6 57.8 - 46.9 %   MCV 87.5 80.0 - 100.0 fL   MCH 29.1 26.0 - 34.0 pg   MCHC 33.3 30.0 - 36.0  g/dL   RDW 62.9 52.8 - 41.3 %   Platelets 137 (L) 150 - 400 K/uL   nRBC 0.0 0.0 - 0.2 %   Neutrophils Relative % 69 %   Neutro Abs 4.5 1.7 - 7.7 K/uL   Lymphocytes Relative 22 %   Lymphs Abs 1.4 0.7 - 4.0 K/uL   Monocytes Relative 8 %   Monocytes Absolute 0.5 0.1 - 1.0 K/uL   Eosinophils Relative 1 %   Eosinophils Absolute 0.1 0.0 - 0.5 K/uL   Basophils Relative 0 %   Basophils Absolute 0.0 0.0 - 0.1 K/uL   Immature Granulocytes 0 %   Abs Immature Granulocytes 0.02 0.00 - 0.07 K/uL    Comment: Performed at [facility], [address] [place], [place] [address]  Basic metabolic panel     Status: Abnormal   Collection Time: 04/14/23  2:21 PM  Result Value Ref Range   Sodium 136 135 - 145 mmol/L   Potassium 3.5 3.5 - 5.1 mmol/L   Chloride 104 98 - 111 mmol/L   CO2 25 22 - 32 mmol/L   Glucose, Bld 115 (H) 70 - 99 mg/dL    Comment: Glucose reference range applies only to samples taken after fasting for at least 8 hours.   BUN 19 8 - 23 mg/dL   Creatinine, Ser 0.27 (H) 0.61 - 1.24 mg/dL   Calcium 9.0 8.9 - 25.3 mg/dL   GFR, Estimated 41 (L) >60 mL/min    Comment: (NOTE) Calculated using the CKD-EPI Creatinine Equation (2021)    Anion gap 7 5 - 15    Comment: Performed at [facility], [address] [place], [place] [address]   CT ABDOMEN PELVIS W CONTRAST  Result Date: 04/14/2023 CLINICAL DATA:  Abdominal pain. Left lower extremity and left hip pain. EXAM: CT ABDOMEN AND PELVIS WITH CONTRAST TECHNIQUE: Multidetector CT imaging of the abdomen and pelvis was performed using the standard protocol following bolus administration of intravenous contrast. RADIATION DOSE REDUCTION: This exam was performed according to the departmental dose-optimization program which includes automated exposure control, adjustment of the mA and/or kV according to patient size and/or use of iterative reconstruction technique. CONTRAST:  80mL OMNIPAQUE IOHEXOL 300 MG/ML  SOLN COMPARISON:   None Available. FINDINGS: Lower chest: Minimal bibasilar dependent atelectasis. There is coronary vascular calcification. AICD lead noted. There is a 7.5 cm right cardiophrenic subpleural bleb. Faint area of lower attenuation in the right lower lobe pulmonary artery (11/2) may be artifactual. If there is clinical concern for pulmonary embolism, further evaluation with chest CT is recommended. No intra-abdominal free air or free fluid. Hepatobiliary: Ill-defined 2 cm enhancing focus in the left lobe of the liver (18/2) is not characterized on this CT  but may represent a flash filling hemangioma or portal venous shunting. Attention on follow-up imaging or further evaluation with ultrasound on a nonemergent/outpatient basis recommended. Several additional subcentimeter hypodense lesions are too small to characterize. No biliary dilatation the gallbladder is unremarkable. Pancreas: Unremarkable. No pancreatic ductal dilatation or surrounding inflammatory changes. Spleen: Normal in size without focal abnormality. Adrenals/Urinary Tract: Indeterminate bilateral adrenal nodules measure up to 1.5 cm on the right, possibly adenoma. These can be better evaluated with MRI on a nonemergent/outpatient basis, if clinically indicated. Small right renal inferior pole cyst as well as additional left renal subcentimeter hypodense lesions which are too small to characterize. There is no hydronephrosis on either side. There is symmetric enhancement and excretion of contrast by both kidneys. The visualized ureters and urinary bladder appear unremarkable. Stomach/Bowel: There is sigmoid diverticulosis and scattered colonic diverticula without active inflammatory changes. There is no bowel obstruction or active inflammation. The appendix is normal. Vascular/Lymphatic: Mild aortoiliac atherosclerotic disease. The IVC is unremarkable. No portal venous gas. No adenopathy. Reproductive: The prostate and seminal vessels are grossly  unremarkable. No pelvic mass. Other: Small fat containing umbilical hernia. Musculoskeletal: Osteopenia with degenerative changes of the spine. Bilateral total hip arthroplasty. No acute osseous pathology. IMPRESSION: 1. No acute intra-abdominal or pelvic pathology. 2. Colonic diverticulosis. No bowel obstruction. Normal appendix. 3. Artifact versus less likely a right lower lobe pulmonary artery embolus. CT angiography of the chest may provide better evaluation if there is clinical concern for acute PE. 4.  Aortic Atherosclerosis (ICD10-I70.0). Electronically Signed   By: Elgie Collard M.D.   On: 04/14/2023 17:03   DG Shoulder Left  Result Date: 04/14/2023 CLINICAL DATA:  Pain post fall EXAM: LEFT SHOULDER - 2+ VIEW COMPARISON:  None Available. FINDINGS: Overlying hardware degrades images. Left subclavian transvenous pacing generator. DJD in the acromioclavicular articulation. Corticated ossicles lateral to the acromion. No definite acute fracture. No dislocation. Normal mineralization. IMPRESSION: 1. No definite acute findings. 2. AC joint DJD. Electronically Signed   By: Corlis Leak M.D.   On: 04/14/2023 14:51   DG Chest Portable 1 View  Result Date: 04/14/2023 CLINICAL DATA:  Pain post fall EXAM: PORTABLE CHEST - 1 VIEW COMPARISON:  01/29/2023 FINDINGS: Lungs are clear.  Stable left subclavian AICD. Heart size and mediastinal contours are within normal limits. Aortic Atherosclerosis (ICD10-170.0). No effusion. Visualized bones unremarkable. IMPRESSION: No acute findings. Electronically Signed   By: Corlis Leak M.D.   On: 04/14/2023 14:41   DG Hip Unilat W or Wo Pelvis 2-3 Views Left  Result Date: 04/14/2023 CLINICAL DATA:  Leg pain after fall.  Rule out fracture. EXAM: DG HIP (WITH OR WITHOUT PELVIS) 2-3V LEFT COMPARISON:  01/05/2020 FINDINGS: Postop change from bilateral total hip arthroplasty. Both hips appear located. There is no sign of left hip fracture. Degenerative disc disease identified  within the lumbar spine. IMPRESSION: 1. No acute findings. 2. Bilateral total hip arthroplasty. Electronically Signed   By: Signa Kell M.D.   On: 04/14/2023 14:39   CT HEAD WO CONTRAST ( )  Result Date: 04/14/2023 CLINICAL DATA:  Fall, pain EXAM: CT HEAD WITHOUT CONTRAST CT CERVICAL SPINE WITHOUT CONTRAST TECHNIQUE: Multidetector CT imaging of the head and cervical spine was performed following the standard protocol without intravenous contrast. Multiplanar CT image reconstructions of the cervical spine were also generated. RADIATION DOSE REDUCTION: This exam was performed according to the departmental dose-optimization program which includes automated exposure control, adjustment of the mA and/or kV according to patient size and/or  use of iterative reconstruction technique. COMPARISON:  None Available. FINDINGS: CT HEAD FINDINGS Brain: No evidence of acute infarction, hemorrhage, hydrocephalus, extra-axial collection or mass lesion/mass effect. Periventricular white matter hypodensity. Vascular: No hyperdense vessel or unexpected calcification. Skull: Normal. Negative for fracture or focal lesion. Sinuses/Orbits: No acute finding. Other: None. CT CERVICAL SPINE FINDINGS Alignment: Degenerative straightening of the normal cervical lordosis. Skull base and vertebrae: No acute fracture. No primary bone lesion or focal pathologic process. Soft tissues and spinal canal: No prevertebral fluid or swelling. No visible canal hematoma. Disc levels: Severe multilevel bridging osteophytosis and ankylosis throughout the cervical spine from C3-C7 in keeping with advanced DISH. Upper chest: Negative. Other: None. IMPRESSION: 1. No acute intracranial pathology. Small-vessel white matter disease. 2. No fracture or static subluxation of the cervical spine. 3. Severe multilevel bridging osteophytosis and ankylosis throughout the cervical spine from C3-C7 in keeping with advanced DISH. Electronically Signed   By: Jearld Lesch M.D.   On: 04/14/2023 14:04   CT Cervical Spine Wo Contrast  Result Date: 04/14/2023 CLINICAL DATA:  Fall, pain EXAM: CT HEAD WITHOUT CONTRAST CT CERVICAL SPINE WITHOUT CONTRAST TECHNIQUE: Multidetector CT imaging of the head and cervical spine was performed following the standard protocol without intravenous contrast. Multiplanar CT image reconstructions of the cervical spine were also generated. RADIATION DOSE REDUCTION: This exam was performed according to the departmental dose-optimization program which includes automated exposure control, adjustment of the mA and/or kV according to patient size and/or use of iterative reconstruction technique. COMPARISON:  None Available. FINDINGS: CT HEAD FINDINGS Brain: No evidence of acute infarction, hemorrhage, hydrocephalus, extra-axial collection or mass lesion/mass effect. Periventricular white matter hypodensity. Vascular: No hyperdense vessel or unexpected calcification. Skull: Normal. Negative for fracture or focal lesion. Sinuses/Orbits: No acute finding. Other: None. CT CERVICAL SPINE FINDINGS Alignment: Degenerative straightening of the normal cervical lordosis. Skull base and vertebrae: No acute fracture. No primary bone lesion or focal pathologic process. Soft tissues and spinal canal: No prevertebral fluid or swelling. No visible canal hematoma. Disc levels: Severe multilevel bridging osteophytosis and ankylosis throughout the cervical spine from C3-C7 in keeping with advanced DISH. Upper chest: Negative. Other: None. IMPRESSION: 1. No acute intracranial pathology. Small-vessel white matter disease. 2. No fracture or static subluxation of the cervical spine. 3. Severe multilevel bridging osteophytosis and ankylosis throughout the cervical spine from C3-C7 in keeping with advanced DISH. Electronically Signed   By: Jearld Lesch M.D.   On: 04/14/2023 14:04    Pending Labs Unresulted Labs (From admission, onward)    None        Vitals/Pain Today's Vitals   04/14/23 1330 04/14/23 1331 04/14/23 1600 04/14/23 1753  BP: 110/76  135/77 129/81  Pulse: 76   77  Resp: (!) 23  14 16   Temp: 97.8 F (36.6 C)   98 F (36.7 C)  TempSrc: Oral   Oral  SpO2: 100%   99%  Weight:  270 lb (122.5 kg)    Height:  6' (1.829 m)      Isolation Precautions No active isolations  Medications Medications  iohexol (OMNIPAQUE) 300 MG/ML solution 80 mL (80 mLs Intravenous Contrast Given 04/14/23 1629)    Mobility walks     Focused Assessments    R Recommendations: See Admitting Provider Note  Report given to:   Additional Notes:

## 2023-04-14 NOTE — ED Notes (Signed)
Pt has a bed assigment @ [facility] Room 29/Report:[phone]/Accepting:Dr.Melvin/Rep:Charlie

## 2023-04-14 NOTE — ED Triage Notes (Signed)
[place] left leg went thru a crate. Patient c/o left lower extremety numbness, severe left hip pain, left shoulder pain. Bilateral pulses intact

## 2023-04-14 NOTE — ED Provider Notes (Signed)
[facility] Provider Note    Event Date/Time   First MD Initiated Contact with Patient 04/14/23 1324     (approximate)   History   [place] ([place] after left leg went thru a crate. Patient c/o left lower extremety numbness, severe left hip pain, left shoulder pain. Bilateral pulses intact )   HPI  Neiman Roots is a 72 y.o. male   sent to the ER [place] he reported fell through crate injuring his left leg.  Uncertain as to whether he hit his head or neck but is complaining of neck pain.  Complaining of left leg numbness and tingling left hip pain as well as left shoulder pain.  Denies any other associated injuries.  Not any blood thinners.  Complaining of left lower quadrant abdominal pain.      Physical Exam   Triage Vital Signs: ED Triage Vitals  Encounter Vitals Group     BP      Systolic BP Percentile      Diastolic BP Percentile      Pulse      Resp      Temp      Temp src      SpO2      Weight      Height      Head Circumference      Peak Flow      Pain Score      Pain Loc      Pain Education      Exclude from Growth Chart     Most recent vital signs: Vitals:   04/14/23 1328 04/14/23 1330  BP:  110/76  Pulse:  76  Resp:  (!) 23  Temp:  97.8 F (36.6 C)  SpO2: 100% 100%     Constitutional: Alert  Eyes: Conjunctivae are normal.  Head: Atraumatic. Nose: No congestion/rhinnorhea. Mouth/Throat: Mucous membranes are moist.   Neck: Painless ROM.  Cardiovascular:   Good peripheral circulation. Respiratory: Normal respiratory effort.  No retractions.  Gastrointestinal: Soft and nontender.  Musculoskeletal:  no deformity Neurologic:  MAE spontaneously. No gross focal neurologic deficits are appreciated.  Cree sensation left lower extremity as well as left upper extremity. Skin:  Skin is warm, dry and intact. No rash noted. Psychiatric: Mood and affect are normal. Speech and behavior  are normal.    ED Results / Procedures / Treatments   Labs (all labs ordered are listed, but only abnormal results are displayed) Labs Reviewed  CBC WITH DIFFERENTIAL/PLATELET - Abnormal; Notable for the following components:      Result Value   Platelets 137 (*)    All other components within normal limits  BASIC METABOLIC PANEL - Abnormal; Notable for the following components:   Glucose, Bld 115 (*)    Creatinine, Ser 1.76 (*)    GFR, Estimated 41 (*)    All other components within normal limits     EKG    RADIOLOGY Patient presenting to the ER for evaluation of symptoms as described above.  Based on symptoms, risk factors and considered above differential, this presenting complaint could reflect a potentially life-threatening illness therefore the patient will be placed on continuous pulse oximetry and telemetry for monitoring.  Laboratory evaluation will be sent to evaluate for the above complaints.      PROCEDURES:  Critical Care performed: No  Procedures   MEDICATIONS ORDERED IN ED: Medications  iohexol (OMNIPAQUE) 300 MG/ML solution 80  mL (has no administration in time range)     IMPRESSION / MDM / ASSESSMENT AND PLAN / ED COURSE  I reviewed the triage vital signs and the nursing notes.                              Differential diagnosis includes, but is not limited to, stroke, contusion, dislocation, ligamentous injury, viscus injury, cord contusion SDH, IPH  Patient presenting to the ER for evaluation of symptoms as described above.  Based on symptoms, risk factors and considered above differential, this presenting complaint could reflect a potentially life-threatening illness therefore the patient will be placed on continuous pulse oximetry and telemetry for monitoring.  Laboratory evaluation will be sent to evaluate for the above complaints.    Clinical Course as of 04/14/23 1605  Sun Apr 14, 2023  1359 CT head on my review and interpretation without  evidence of SDH or IPH will await formal radiology report. [PR]  1520 Discussed the case in consultation with neurosurgery on-call regarding my concern for possible cord injury.  Agrees with need for MRI to further evaluate.  Due to the patient's defibrillator we are unable to obtain MRI at this facility.  I consulted with [facility] for transfer but they also are not able to arrange diagnostic testing until weekday.  Given the acuity of his presentation I think it is more appropriate to reach out to other tertiary facilities.  Family agreeable to going to [facility]. [PR]  [facility] unable to assist as they are currently on diversion.  Patient be signed out to physician pending follow-up CT imaging. [PR]    Clinical Course User Index [PR] Willy Eddy, MD     FINAL CLINICAL IMPRESSION(S) / ED DIAGNOSES   Final diagnoses:  Numbness and tingling of left side of face  Fall, initial encounter     Rx / DC Orders   ED Discharge Orders     None        Note:  This document was prepared using Dragon voice recognition software and may include unintentional dictation errors.    Willy Eddy, MD 04/14/23 [phone]

## 2023-04-14 NOTE — Progress Notes (Signed)
Plan of Care Note for accepted transfer   Patient: Jakarri Lesko MRN: [id number]   DOA: (Not on file)  Facility requesting transfer: [facility] Requesting Provider: Dr. Larinda Buttery Reason for transfer: Services not available Facility course:   [place]ed [place] [facility] [place] [facility].  Came in with left hip pain, left shoulder pain, neck pain, left-sided numbness and weakness.  Chest x-ray, left shoulder x-ray, left hip x-ray, CT head, CT C-spine, CT abdomen pelvis were without acute abnormalities.  Chronic cervical spine changes on CT neck and artifact versus PE noted on upper CT chest (though PE thought to be less likely).  With numbness and weakness plans for MRI evaluation and neurosurgery they agreed with this.  However, with his AICD, they are unable to form MRI there.  They are unable to do this at any time and not just over the weekend.  Requested transfer to [facility] to be able to get MRI performed tomorrow.  Patient accepted for transfer to telemetry bed.  If unable to get bed by the morning recommended consideration of ED to ED transfer to [facility] and [facility].  Plan of care: The patient is accepted for admission to Telemetry unit, at [facility]   Author: Synetta Fail, MD 04/14/2023  Check [email or web address] for on-call coverage.  Nursing staff, Please call [facility] Admits & Consults System-Wide number on Amion as soon as patient's arrival, so appropriate admitting provider can evaluate the pt.

## 2023-04-14 NOTE — ED Provider Notes (Signed)
-----------------------------------------   4:10 PM on 04/14/2023 -----------------------------------------  Blood pressure 129/81, pulse 77, temperature 98 F (36.7 C), temperature source Oral, resp. rate 16, height 6' (1.829 m), weight 122.5 kg, SpO2 99%.  Assuming care from Dr. Roxan Hockey.  In short, Erik Black is a 72 y.o. male with a chief complaint of Fall ([place] after left leg went thru a crate. Patient c/o left lower extremety numbness, severe left hip pain, left shoulder pain. Bilateral pulses intact ) .  Refer to the original H&P for additional details.  The current plan of care is to follow-up CT abdomen/pelvis, plan for transfer to tertiary care center for MRI imaging.  ----------------------------------------- 6:19 PM on 04/14/2023 ----------------------------------------- CT imaging of abdomen/pelvis is negative for intra-abdominal injury, does show adder fact versus less likely PE and chest.  Low suspicion for PE at this time as patient denies any chest pain or shortness of breath.  Case discussed with hospitalist at [facility], Dr. Alinda Money, who accepts patient for transfer for MRI imaging for acute cervical spine injury.  Transfer pending bed availability at this time.   Chesley Noon, MD 04/14/23 [phone]

## 2023-04-15 ENCOUNTER — Observation Stay (HOSPITAL_COMMUNITY): Payer: Medicare HMO

## 2023-04-15 DIAGNOSIS — W1830XA Fall on same level, unspecified, initial encounter: Secondary | ICD-10-CM | POA: Diagnosis not present

## 2023-04-15 DIAGNOSIS — E785 Hyperlipidemia, unspecified: Secondary | ICD-10-CM | POA: Diagnosis not present

## 2023-04-15 DIAGNOSIS — N179 Acute kidney failure, unspecified: Secondary | ICD-10-CM | POA: Diagnosis not present

## 2023-04-15 NOTE — Discharge Summary (Signed)
Physician Discharge Summary  Erik Black RKY:[id number] DOB: 08/07/1950 DOA: 04/14/2023  PCP: [facility]  Admit date: 04/14/2023 Discharge date: 04/15/2023  Admitted From: Home Disposition: Home  Recommendations for Outpatient Follow-up:  Follow up with PCP in 1-2 weeks  Home Health: No needs identified by PT/OT with during hospitalization Equipment/Devices: None  Discharge Condition: Stable CODE STATUS: Full code Diet recommendation: Heart healthy/consistent carbohydrate diet  History of present illness:  Erik Black heart failure with reduced ejection fraction, ICD, CVA with no residual deficits, hypertension, hyperlipidemia, diabetes, CKD 3, OSA, hip and knee replacements, who was transferred from [facility] ED after ground-level fall due to concern for possible spinal cord injury without radiographic findings.  He reports he was in normal state of health until 10/13 afternoon he was at a car wash and stepped onto a grate with his left foot.  The great gave way and he fell downwards with his left leg only into water and left leg never hit the ground on that side.  He thinks he put his arms out and caught himself with his elbows on the ground.  Was unclear if he hit his head.  His primary complaints are abrasions over the elbows, left abdomen, left thigh and pain in these areas, pain in both shoulders. he had a minimal headache but this is resolved.  He does not currently have any neck pain.  He arrived in a cervical collar.  He states that he had tingling in the tips of his fingers on both hands and feet on both sides right as he was arriving at the other hospital, these have resolved and were transient. However he denies any numbness or weakness anywhere.  Per outside hospital record that he had reported numbness and weakness on his left side hence the concern for spinal cord injury.  Otherwise denies any speech changes, vision changes.  He was in normal state of health  prior to the fall.  He is currently taking daily aspirin.  He is not on any other anticoagulants or antiplatelets.   At [facility], no acute pathology on CT head or C-spine.  Incidental finding of DISH in the C-spine.  Per review of outside records patient had complained of left-sided numbness and weakness however per their examination there were no focal deficits.  Outside ED provider had discussed with neurosurgery and due to concern for a cord injury had been recommended for MRI which needed to be arranged with his AICD; and patient was subsequent transferred to [facility] for further evaluation and management under the hospitalist service.  Hospital course:  Ground-level fall, mechanical Patient presenting to ED following mechanical fall at home after he fell through a sewer grate.  Evaluation for associated traumatic injuries demonstrates no significant trauma.  CT of the head with no acute abnormalities, CT of the C-spine without acute abnormalities, incidental finding of DISH, x-ray of the hips, left shoulder, chest x-ray also without acute injuries.  X-ray left elbow with some mild posterior soft tissue swelling, otherwise unremarkable.  Seen by PT and OT with no needs identified.  Continue Tylenol as needed for pain control.   Previous concern for spinal cord injury without radiographic findings  He was originally transferred from [facility] to [facility] for concern for possible spinal cord injury without radiographic findings. Per review of outside records patient had complained of left-sided numbness and weakness however per their examination there were no focal deficits.  Outside ED provider had discussed with neurosurgery and due to  concern for a cord injury had been recommended for MRI which needed to be arranged with his AICD. However on my evaluation he has no subjective symptoms of neurologic issue, and on my exam has patches of hypoesthesia near C5 and also L4-5 on the left. However other  dermatomes intact, motor intact. No UMN signs. Overall these findings are not concerning for a spinal cord injury.  Discussed his case with NSGY on call, Dr. Vick Frees. Based on my evaluation she agrees that this does not seem consistent with spinal cord imaging. Feels outpatient follow-up is appropriate.  Patient's symptoms have all resolved.  Patient was cleared from cervical collar utilizing the following criteria; he is alert with reliable neurological exam, no high risk features such as mechanism of injury, no acute abnormalities noted on CT C-spine, no severe posterior neck pain or midline tenderness and able to range neck 45 degrees to the left and right.  C-collar was discontinued.  Discharging home, Tylenol as needed for symptom management.   AKI stage I  Background CKD 3 Baseline creatinine approximately 1.2-1.3, although fluctuates.  Elevated to 1.76 on admission.  Suspect mild prerenal with decreased intake in light of his fall and being n.p.o. recommend repeat BMP 1 week.   Heart failure with reduced ejection fraction, ICD, without acute exacerbation:  Essential hypertension Continue home Coreg, spironolactone, mexilitine, torsemide and Entresto   Hx CVA:  Continue aspirin, no recent fills for statin but would resume   Diabetes: Continue semaglutide, Lantus 15 units subcutaneously daily,  Hyperlipidemia:  Continue statin  GERD Continue PPI  OSA:  Continue nocturnal CPAP  Obesity Body mass index is 36.93 kg/m.  Complicates all facets of care  Discharge Diagnoses:  Principal Problem:   Ground-level fall    Discharge Instructions  Discharge Instructions     Call MD for:  difficulty breathing, headache or visual disturbances   Complete by: As directed    Call MD for:  extreme fatigue   Complete by: As directed    Call MD for:  persistant dizziness or light-headedness   Complete by: As directed    Call MD for:  persistant nausea and vomiting   Complete by: As  directed    Call MD for:  severe uncontrolled pain   Complete by: As directed    Call MD for:  temperature >100.4   Complete by: As directed    Diet - low sodium heart healthy   Complete by: As directed    Increase activity slowly   Complete by: As directed       Allergies as of 04/15/2023       Reactions   Empagliflozin Other (See Comments)   Yeast infection, Bleeding skin   Methadone Itching   Oxycodone Itching   Hands started peeling and "could not swallow"   Lisinopril Cough        Medication List     TAKE these medications    acetaminophen 500 MG tablet Commonly known as: TYLENOL Take 1,000 mg by mouth as needed for moderate pain.   amLODipine 10 MG tablet Commonly known as: NORVASC Take 10 mg by mouth daily.   aspirin EC 81 MG tablet Take 1 tablet (81 mg total) by mouth 2 (two) times daily for 6 weeks, then as directed by provider.   atorvastatin 40 MG tablet Commonly known as: LIPITOR Take 1 tablet (40 mg total) by mouth at bedtime.   BD Pen Needle Nano U/F 32G X 4 MM Misc Generic drug: Insulin  Pen Needle Use at bedtime.   Breztri Aerosphere 160-9-4.8 MCG/ACT Aero Generic drug: Budeson-Glycopyrrol-Formoterol Inhale 1 puff into the lungs 2 (two) times daily.   Brinzolamide-Brimonidine 1-0.2 % Susp Place 1 drop into both eyes 3 (three) times daily.   carvedilol 25 MG tablet Commonly known as: COREG TAKE 1 TABLET BY MOUTH TWICE A DAY   docusate sodium 100 MG capsule Commonly known as: COLACE Take 100 mg by mouth daily as needed for mild constipation or moderate constipation.   DULoxetine 60 MG capsule Commonly known as: CYMBALTA Take 60 mg by mouth daily.   Entresto 97-103 MG Generic drug: sacubitril-valsartan TAKE 1 TABLET BY MOUTH TWICE A DAY   Lantus SoloStar 100 UNIT/ML Solostar Pen Generic drug: insulin glargine Inject 15 Units into the skin at bedtime.   latanoprost 0.005 % ophthalmic solution Commonly known as: XALATAN Place 1  drop into both eyes at bedtime.   mexiletine 200 MG capsule Commonly known as: MEXITIL TAKE 1 CAPSULE BY MOUTH TWICE A DAY   multivitamin with minerals tablet Take 1 tablet by mouth daily.   Omega-3 1000 MG Caps Take 1,000 mg by mouth 3 (three) times daily after meals.   omeprazole 20 MG capsule Commonly known as: PRILOSEC Take 20 mg by mouth 2 (two) times daily.   OXcarbazepine 150 MG tablet Commonly known as: TRILEPTAL Take 150 mg by mouth 2 (two) times daily.   Ozempic (2 MG/DOSE) 8 MG/3ML Sopn Generic drug: Semaglutide (2 MG/DOSE) Inject 2 mg into the skin once a week.   potassium chloride SA 20 MEQ tablet Commonly known as: KLOR-CON M Take 20 mEq by mouth daily.   spironolactone 25 MG tablet Commonly known as: ALDACTONE TAKE 1 TABLET (25 MG TOTAL) BY MOUTH DAILY.   timolol 0.5 % ophthalmic gel-forming Commonly known as: TIMOPTIC-XR Place 1 drop into both eyes daily.   torsemide 20 MG tablet Commonly known as: DEMADEX TAKE 2 TABLETS (40 MG TOTAL) BY MOUTH DAILY. What changed:  how much to take when to take this   Vitamin D 50 MCG (2000 UT) tablet Take 4,000 Units by mouth daily.        Follow-up Information     [facility]. Schedule an appointment as soon as possible for a visit in 1 week(s).   Specialty: General Practice Contact information: [address] [place] [address] [phone]                Allergies  Allergen Reactions   Empagliflozin Other (See Comments)    Yeast infection, Bleeding skin   Methadone Itching   Oxycodone Itching    Hands started peeling and "could not swallow"   Lisinopril Cough    Consultations: Case discussed with neurosurgery, Dr. Tonna Boehringer   Procedures/Studies: DG Elbow 2 Views Left  Result Date: 04/15/2023 CLINICAL DATA:  Fall EXAM: LEFT ELBOW - 2 VIEW COMPARISON:  None Available. FINDINGS: There is no evidence of fracture, dislocation, or joint effusion. Joint spaces are  maintained. Olecranon spur is present. There is soft tissue swelling posterior to the elbow. IMPRESSION: 1. No acute fracture or dislocation. 2. Soft tissue swelling posterior to the elbow. Electronically Signed   By: Darliss Cheney M.D.   On: 04/15/2023 01:06   CT ABDOMEN PELVIS W CONTRAST  Result Date: 04/14/2023 CLINICAL DATA:  Abdominal pain. Left lower extremity and left hip pain. EXAM: CT ABDOMEN AND PELVIS WITH CONTRAST TECHNIQUE: Multidetector CT imaging of the abdomen and pelvis was performed using the standard protocol  following bolus administration of intravenous contrast. RADIATION DOSE REDUCTION: This exam was performed according to the departmental dose-optimization program which includes automated exposure control, adjustment of the mA and/or kV according to patient size and/or use of iterative reconstruction technique. CONTRAST:  80mL OMNIPAQUE IOHEXOL 300 MG/ML  SOLN COMPARISON:  None Available. FINDINGS: Lower chest: Minimal bibasilar dependent atelectasis. There is coronary vascular calcification. AICD lead noted. There is a 7.5 cm right cardiophrenic subpleural bleb. Faint area of lower attenuation in the right lower lobe pulmonary artery (11/2) may be artifactual. If there is clinical concern for pulmonary embolism, further evaluation with chest CT is recommended. No intra-abdominal free air or free fluid. Hepatobiliary: Ill-defined 2 cm enhancing focus in the left lobe of the liver (18/2) is not characterized on this CT but may represent a flash filling hemangioma or portal venous shunting. Attention on follow-up imaging or further evaluation with ultrasound on a nonemergent/outpatient basis recommended. Several additional subcentimeter hypodense lesions are too small to characterize. No biliary dilatation the gallbladder is unremarkable. Pancreas: Unremarkable. No pancreatic ductal dilatation or surrounding inflammatory changes. Spleen: Normal in size without focal abnormality.  Adrenals/Urinary Tract: Indeterminate bilateral adrenal nodules measure up to 1.5 cm on the right, possibly adenoma. These can be better evaluated with MRI on a nonemergent/outpatient basis, if clinically indicated. Small right renal inferior pole cyst as well as additional left renal subcentimeter hypodense lesions which are too small to characterize. There is no hydronephrosis on either side. There is symmetric enhancement and excretion of contrast by both kidneys. The visualized ureters and urinary bladder appear unremarkable. Stomach/Bowel: There is sigmoid diverticulosis and scattered colonic diverticula without active inflammatory changes. There is no bowel obstruction or active inflammation. The appendix is normal. Vascular/Lymphatic: Mild aortoiliac atherosclerotic disease. The IVC is unremarkable. No portal venous gas. No adenopathy. Reproductive: The prostate and seminal vessels are grossly unremarkable. No pelvic mass. Other: Small fat containing umbilical hernia. Musculoskeletal: Osteopenia with degenerative changes of the spine. Bilateral total hip arthroplasty. No acute osseous pathology. IMPRESSION: 1. No acute intra-abdominal or pelvic pathology. 2. Colonic diverticulosis. No bowel obstruction. Normal appendix. 3. Artifact versus less likely a right lower lobe pulmonary artery embolus. CT angiography of the chest may provide better evaluation if there is clinical concern for acute PE. 4.  Aortic Atherosclerosis (ICD10-I70.0). Electronically Signed   By: Elgie Collard M.D.   On: 04/14/2023 17:03   DG Shoulder Left  Result Date: 04/14/2023 CLINICAL DATA:  Pain post fall EXAM: LEFT SHOULDER - 2+ VIEW COMPARISON:  None Available. FINDINGS: Overlying hardware degrades images. Left subclavian transvenous pacing generator. DJD in the acromioclavicular articulation. Corticated ossicles lateral to the acromion. No definite acute fracture. No dislocation. Normal mineralization. IMPRESSION: 1. No  definite acute findings. 2. AC joint DJD. Electronically Signed   By: Corlis Leak M.D.   On: 04/14/2023 14:51   DG Chest Portable 1 View  Result Date: 04/14/2023 CLINICAL DATA:  Pain post fall EXAM: PORTABLE CHEST - 1 VIEW COMPARISON:  01/29/2023 FINDINGS: Lungs are clear.  Stable left subclavian AICD. Heart size and mediastinal contours are within normal limits. Aortic Atherosclerosis (ICD10-170.0). No effusion. Visualized bones unremarkable. IMPRESSION: No acute findings. Electronically Signed   By: Corlis Leak M.D.   On: 04/14/2023 14:41   DG Hip Unilat W or Wo Pelvis 2-3 Views Left  Result Date: 04/14/2023 CLINICAL DATA:  Leg pain after fall.  Rule out fracture. EXAM: DG HIP (WITH OR WITHOUT PELVIS) 2-3V LEFT COMPARISON:  01/05/2020 FINDINGS: Postop  change from bilateral total hip arthroplasty. Both hips appear located. There is no sign of left hip fracture. Degenerative disc disease identified within the lumbar spine. IMPRESSION: 1. No acute findings. 2. Bilateral total hip arthroplasty. Electronically Signed   By: Signa Kell M.D.   On: 04/14/2023 14:39   CT HEAD WO CONTRAST ( )  Result Date: 04/14/2023 CLINICAL DATA:  Fall, pain EXAM: CT HEAD WITHOUT CONTRAST CT CERVICAL SPINE WITHOUT CONTRAST TECHNIQUE: Multidetector CT imaging of the head and cervical spine was performed following the standard protocol without intravenous contrast. Multiplanar CT image reconstructions of the cervical spine were also generated. RADIATION DOSE REDUCTION: This exam was performed according to the departmental dose-optimization program which includes automated exposure control, adjustment of the mA and/or kV according to patient size and/or use of iterative reconstruction technique. COMPARISON:  None Available. FINDINGS: CT HEAD FINDINGS Brain: No evidence of acute infarction, hemorrhage, hydrocephalus, extra-axial collection or mass lesion/mass effect. Periventricular white matter hypodensity. Vascular: No  hyperdense vessel or unexpected calcification. Skull: Normal. Negative for fracture or focal lesion. Sinuses/Orbits: No acute finding. Other: None. CT CERVICAL SPINE FINDINGS Alignment: Degenerative straightening of the normal cervical lordosis. Skull base and vertebrae: No acute fracture. No primary bone lesion or focal pathologic process. Soft tissues and spinal canal: No prevertebral fluid or swelling. No visible canal hematoma. Disc levels: Severe multilevel bridging osteophytosis and ankylosis throughout the cervical spine from C3-C7 in keeping with advanced DISH. Upper chest: Negative. Other: None. IMPRESSION: 1. No acute intracranial pathology. Small-vessel white matter disease. 2. No fracture or static subluxation of the cervical spine. 3. Severe multilevel bridging osteophytosis and ankylosis throughout the cervical spine from C3-C7 in keeping with advanced DISH. Electronically Signed   By: Jearld Lesch M.D.   On: 04/14/2023 14:04   CT Cervical Spine Wo Contrast  Result Date: 04/14/2023 CLINICAL DATA:  Fall, pain EXAM: CT HEAD WITHOUT CONTRAST CT CERVICAL SPINE WITHOUT CONTRAST TECHNIQUE: Multidetector CT imaging of the head and cervical spine was performed following the standard protocol without intravenous contrast. Multiplanar CT image reconstructions of the cervical spine were also generated. RADIATION DOSE REDUCTION: This exam was performed according to the departmental dose-optimization program which includes automated exposure control, adjustment of the mA and/or kV according to patient size and/or use of iterative reconstruction technique. COMPARISON:  None Available. FINDINGS: CT HEAD FINDINGS Brain: No evidence of acute infarction, hemorrhage, hydrocephalus, extra-axial collection or mass lesion/mass effect. Periventricular white matter hypodensity. Vascular: No hyperdense vessel or unexpected calcification. Skull: Normal. Negative for fracture or focal lesion. Sinuses/Orbits: No acute  finding. Other: None. CT CERVICAL SPINE FINDINGS Alignment: Degenerative straightening of the normal cervical lordosis. Skull base and vertebrae: No acute fracture. No primary bone lesion or focal pathologic process. Soft tissues and spinal canal: No prevertebral fluid or swelling. No visible canal hematoma. Disc levels: Severe multilevel bridging osteophytosis and ankylosis throughout the cervical spine from C3-C7 in keeping with advanced DISH. Upper chest: Negative. Other: None. IMPRESSION: 1. No acute intracranial pathology. Small-vessel white matter disease. 2. No fracture or static subluxation of the cervical spine. 3. Severe multilevel bridging osteophytosis and ankylosis throughout the cervical spine from C3-C7 in keeping with advanced DISH. Electronically Signed   By: Jearld Lesch M.D.   On: 04/14/2023 14:04     Subjective: Patient seen examined bedside, resting home.  Lying in bed.  Spouse present.  No specific complaints this morning.  Patient reports his presenting symptoms all have resolved and at baseline.  Asking to take  a shower this morning.  Seen by PT and OT with no needs identified.  Discharging home.  No other questions or concerns at this time.  Denies headache, no visual changes, no chest pain, no palpitations, no shortness of breath, no abdominal pain, no fever/chills/night sweats, no nausea/vomiting/diarrhea, no focal weakness, no fatigue, no paresthesias.  No acute events overnight per nursing.  Discharge Exam: Vitals:   04/14/23 2100 04/15/23 0130  BP: (!) 154/85 135/79  Pulse: 70 84  Resp: 18 18  Temp: 97.6 F (36.4 C) 98.1 F (36.7 C)  SpO2: 100% 100%   Vitals:   04/14/23 2100 04/15/23 0130  BP: (!) 154/85 135/79  Pulse: 70 84  Resp: 18 18  Temp: 97.6 F (36.4 C) 98.1 F (36.7 C)  TempSrc: Oral Oral  SpO2: 100% 100%  Weight: 123.5 kg   Height: 6' (1.829 m)     Physical Exam: GEN: NAD, alert and oriented x 3, obese HEENT: NCAT, PERRL, EOMI, sclera clear,  MMM PULM: CTAB w/o wheezes/crackles, normal respiratory effort, on room air CV: RRR w/o M/G/R GI: abd soft, NTND, NABS, no R/G/M MSK: no peripheral edema, muscle strength globally intact 5/5 bilateral upper/lower extremities, bilateral shoulders with restricted range of motion but painless, patient reports chronic from rotator cuff injuries, tenderness to lateral aspect left elbow but normal active/passive range of motion without pain, left lateral thigh with abrasion and mild superficial tenderness NEURO: CN II-XII intact, no focal deficits, sensation to light touch intact PSYCH: normal mood/affect Integumentary: No concerning rashes/lesions/wounds noted on exposed skin surfaces    The results of significant diagnostics from this hospitalization (including imaging, microbiology, ancillary and laboratory) are listed below for reference.     Microbiology: No results found for this or any previous visit (from the past 240 hour(s)).   Labs: BNP (last 3 results) Recent Labs    08/01/22 1118 09/11/22 1055 12/05/22 1147  BNP 412.4* 201.4* 82.4   Basic Metabolic Panel: Recent Labs  Lab 04/14/23 1421  NA 136  K 3.5  CL 104  CO2 25  GLUCOSE 115*  BUN 19  CREATININE 1.76*  CALCIUM 9.0   Liver Function Tests: No results for input(s): "AST", "ALT", "ALKPHOS", "BILITOT", "PROT", "ALBUMIN" in the last 168 hours. No results for input(s): "LIPASE", "AMYLASE" in the last 168 hours. No results for input(s): "AMMONIA" in the last 168 hours. CBC: Recent Labs  Lab 04/14/23 1421  WBC 6.6  NEUTROABS 4.5  HGB 13.5  HCT 40.6  MCV 87.5  PLT 137*   Cardiac Enzymes: No results for input(s): "CKTOTAL", "CKMB", "CKMBINDEX", "TROPONINI" in the last 168 hours. BNP: Invalid input(s): "POCBNP" CBG: No results for input(s): "GLUCAP" in the last 168 hours. D-Dimer No results for input(s): "DDIMER" in the last 72 hours. Hgb A1c No results for input(s): "HGBA1C" in the last 72 hours. Lipid  Profile No results for input(s): "CHOL", "HDL", "LDLCALC", "TRIG", "CHOLHDL", "LDLDIRECT" in the last 72 hours. Thyroid function studies No results for input(s): "TSH", "T4TOTAL", "T3FREE", "THYROIDAB" in the last 72 hours.  Invalid input(s): "FREET3" Anemia work up No results for input(s): "VITAMINB12", "FOLATE", "FERRITIN", "TIBC", "IRON", "RETICCTPCT" in the last 72 hours. Urinalysis    Component Value Date/Time   COLORURINE AMBER (A) 01/22/2023 1039   APPEARANCEUR HAZY (A) 01/22/2023 1039   LABSPEC 1.018 01/22/2023 1039   PHURINE 5.0 01/22/2023 1039   GLUCOSEU 50 (A) 01/22/2023 1039   HGBUR NEGATIVE 01/22/2023 1039   BILIRUBINUR NEGATIVE 01/22/2023 1039   KETONESUR NEGATIVE  01/22/2023 1039   PROTEINUR 100 (A) 01/22/2023 1039   NITRITE NEGATIVE 01/22/2023 1039   LEUKOCYTESUR NEGATIVE 01/22/2023 1039   Sepsis Labs Recent Labs  Lab 04/14/23 1421  WBC 6.6   Microbiology No results found for this or any previous visit (from the past 240 hour(s)).   Time coordinating discharge: Over 30 minutes  SIGNED:   Alvira Philips [place], DO  [facility] 04/15/2023, 10:56 AM

## 2023-04-15 NOTE — Progress Notes (Signed)
AVS discussed with pt and wife. Pt d/ced home with wife.

## 2023-04-15 NOTE — Evaluation (Signed)
Physical Therapy Brief Evaluation and Discharge Note Patient Details Name: Erik Black MRN: [id number] DOB: 1951-02-10 Today's Date: 04/15/2023   History of Present Illness  Erik Black is a 72 y.o. male with hx of heart failure with reduced ejection fraction, ICD, CVA with no residual deficits, hypertension, hyperlipidemia, diabetes, CKD 3, OSA, hip and knee replacements, who was transferred from [facility] ED after ground-level fall due to concern for possible spinal cord injury without radiographic findings.  He reports he was in normal state of health until 10/13 afternoon he was at a car wash and stepped onto a grate with his left foot.  The great gave way and he fell downwards with his left leg only into water and left leg never hit the ground on that side.  Clinical Impression  Pt presents with admitting diagnosis above. PTA pt was fully independent with no AD. Pt today was able to ambulate in hallway independently and navigate stairs. Pt noted with slightly diminshed sensation along C6 and T1 dermatome on L side. Also noted diminished sensation on entire LLE. Pt presents at or near baseline mobility. Pt has no further acute PT needs and will be signing off. Re consult PT if mobility status changes.     PT Assessment Patient does not need any further PT services  Assistance Needed at Discharge  PRN    Equipment Recommendations None recommended by PT  Recommendations for Other Services       Precautions/Restrictions Precautions Precautions: Fall Restrictions Weight Bearing Restrictions: No        Mobility  Bed Mobility   Supine/Sidelying to sit: Modified independent (Device/Increased time), Used rails      Transfers Overall transfer level: Independent Equipment used: None                    Ambulation/Gait Ambulation/Gait assistance: Independent Gait Distance (Feet): 300 Feet Assistive device: None Gait Pattern/deviations: WFL(Within Functional  Limits) Gait Speed: Pace WFL General Gait Details: no LOB noted. Pt occasionally reached for external support  Home Activity Instructions    Stairs Stairs: Yes Stairs assistance: Independent Stair Management: Two rails, Alternating pattern, Step to pattern, Forwards Number of Stairs: 4 General stair comments: no LOB noted.  Modified Rankin (Stroke Patients Only)        Balance Overall balance assessment: Independent                        Pertinent Vitals/Pain PT - Brief Vital Signs All Vital Signs Stable: Yes Pain Assessment Pain Assessment: 0-10 Pain Score: 5  Pain Location: B elbow, abdomen, LLE Pain Descriptors / Indicators: Discomfort, Sore Pain Intervention(s): Monitored during session, Premedicated before session     Home Living Family/patient expects to be discharged to:: Private residence Living Arrangements: Spouse/significant other Available Help at Discharge: Family;Available 24 hours/day Home Environment: Stairs to enter;Rail - right;Rail - left  Stairs-Number of Steps: 3 Home Equipment: Grab bars - toilet;Rollator (4 wheels);Cane - quad;Cane - single point;Shower seat;Hand held shower head        Prior Function Level of Independence: Independent      UE/LE Assessment   UE ROM/Strength/Tone/Coordination: WFL    LE ROM/Strength/Tone/Coordination: [facility]      Communication   Communication Communication: No apparent difficulties     Cognition Overall Cognitive Status: Appears within functional limits for tasks assessed/performed       General Comments General comments (skin integrity, edema, etc.): Slightly diminshed sensation along C6 and T1 dermatome on  L side. Also noted diminished sensation on entire LLE.    Exercises     Assessment/Plan    PT Problem List         PT Visit Diagnosis Other abnormalities of gait and mobility (R26.89)    No Skilled PT Patient at baseline level of functioning;Patient is independent with  all acitivity/mobility   Co-evaluation                AMPAC 6 Clicks Help needed turning from your back to your side while in a flat bed without using bedrails?: None Help needed moving from lying on your back to sitting on the side of a flat bed without using bedrails?: None Help needed moving to and from a bed to a chair (including a wheelchair)?: None Help needed standing up from a chair using your arms (e.g., wheelchair or bedside chair)?: None Help needed to walk in hospital room?: None Help needed climbing 3-5 steps with a railing? : None 6 Click Score: 24      End of Session   Activity Tolerance: Patient tolerated treatment well Patient left: Other (comment) (Handoff to OT) Nurse Communication: Mobility status PT Visit Diagnosis: Other abnormalities of gait and mobility (R26.89)     Time: 8119-1478 PT Time Calculation (min) (ACUTE ONLY): 27 min  Charges:   PT Evaluation $PT Eval Low Complexity: 1 Low PT Treatments $Gait Training: 8-22 mins    Shela Nevin, PT, DPT Acute Rehab Services [phone]   Gladys Damme  04/15/2023, 10:18 AM

## 2023-04-15 NOTE — H&P (Signed)
History and Physical    Erik Black XBM:[id number] DOB: [birth year] DOA: 04/14/2023  PCP: [facility]   Patient coming from:  Transfer from [facility]   Chief Complaint:  Ground level fall    HPI:  Erik Black is a 72 y.o. male with hx of heart failure with reduced ejection fraction, ICD, CVA with no residual deficits, hypertension, hyperlipidemia, diabetes, CKD 3, OSA, hip and knee replacements, who was transferred from [facility] ED after ground-level fall due to concern for possible spinal cord injury without radiographic findings.  He reports he was in normal state of health until 10/13 afternoon he was at a car wash and stepped onto a grate with his left foot.  The great gave way and he fell downwards with his left leg only into water and left leg never hit the ground on that side.  He thinks he put his arms out and caught himself with his elbows on the ground.  Was unclear if he hit his head.  His primary complaints are abrasions over the elbows, left abdomen, left thigh and pain in these areas, pain in both shoulders. he had a minimal headache but this is resolved.  He does not currently have any neck pain.  He arrived in a cervical collar.  He states that he had tingling in the tips of his fingers on both hands and feet on both sides right as he was arriving at the other hospital, these have resolved and were transient. However he denies any numbness or weakness anywhere.  Per outside hospital record that he had reported numbness and weakness on his left side hence the concern for spinal cord injury.  Otherwise denies any speech changes, vision changes.  He was in normal state of health prior to the fall.  He is currently taking daily aspirin.  He is not on any other anticoagulants or antiplatelets.    Review of Systems:  ROS complete and negative except as marked above   Allergies  Allergen Reactions   Empagliflozin Other (See Comments)    Yeast infection  Other Reaction(s):  Bleeding skin   Methadone    Oxycodone Itching    Hands started peeling and "could not swallow"   Lisinopril Cough    Prior to Admission medications   Medication Sig Start Date End Date Taking? Authorizing Provider  amLODipine (NORVASC) 10 MG tablet Take 10 mg by mouth daily. 08/16/22 08/17/23  [provider]  aspirin EC 81 MG tablet Take 1 tablet (81 mg total) by mouth 2 (two) times daily for 6 weeks, then as directed by provider. 02/03/23   Graciella Freer, PA-C  atorvastatin (LIPITOR) 40 MG tablet Take 1 tablet (40 mg total) by mouth at bedtime. 08/01/22   Bensimhon, Bevelyn Buckles, MD  BREZTRI AEROSPHERE 160-9-4.8 MCG/ACT AERO Inhale 1 puff into the lungs 2 (two) times daily. 04/17/22   [provider]  Brinzolamide-Brimonidine 1-0.2 % SUSP Apply 1 drop to eye 3 (three) times daily. 01/04/23   [provider]  carvedilol (COREG) 25 MG tablet TAKE 1 TABLET BY MOUTH TWICE A DAY 11/16/22   Bensimhon, Bevelyn Buckles, MD  Cholecalciferol (VITAMIN D) 50 MCG (2000 UT) tablet Take 4,000 Units by mouth daily.  03/03/19   [provider]  docusate sodium (COLACE) 100 MG capsule Take 100 mg by mouth daily as needed. 07/03/22   [provider]  DULoxetine (CYMBALTA) 60 MG capsule Take 60 mg by mouth daily. 01/07/19   [provider]  Sherryll Burger  97-103 MG TAKE 1 TABLET BY MOUTH TWICE A DAY 04/08/23   Bensimhon, Bevelyn Buckles, MD  insulin glargine (LANTUS) 100 UNIT/ML Solostar Pen Inject 15 Units into the skin at bedtime. 01/29/23   Graciella Freer, PA-C  Insulin Pen Needle 32G X 4 MM MISC Use at bedtime. 01/29/23   Graciella Freer, PA-C  latanoprost (XALATAN) 0.005 % ophthalmic solution Place 1 drop into both eyes at bedtime.    [provider]  mexiletine (MEXITIL) 200 MG capsule TAKE 1 CAPSULE BY MOUTH TWICE A DAY 04/04/23   Bensimhon, Bevelyn Buckles, MD  Multiple Vitamins-Minerals (MULTIVITAMIN WITH MINERALS) tablet Take 1 tablet by mouth daily.     [provider]  Omega-3 1000 MG CAPS Take 2,000 mg by mouth 3 (three) times daily after meals.    [provider]  omeprazole (PRILOSEC) 20 MG capsule Take 20 mg by mouth 2 (two) times daily.    [provider]  OXcarbazepine (TRILEPTAL) 150 MG tablet Take 150 mg by mouth 2 (two) times daily.    [provider]  Semaglutide, 2 MG/DOSE, (OZEMPIC, 2 MG/DOSE,) 8 MG/3ML SOPN Inject 2 mg into the skin once a week. 04/05/23   Bensimhon, Bevelyn Buckles, MD  spironolactone (ALDACTONE) 25 MG tablet TAKE 1 TABLET (25 MG TOTAL) BY MOUTH DAILY. 11/16/22   Bensimhon, Bevelyn Buckles, MD  timolol (TIMOPTIC-XR) 0.5 % ophthalmic gel-forming Place 1 drop into both eyes daily.    [provider]  torsemide (DEMADEX) 20 MG tablet TAKE 2 TABLETS (40 MG TOTAL) BY MOUTH DAILY. 01/07/23   Bensimhon, Bevelyn Buckles, MD    Past Medical History:  Diagnosis Date   Acute on chronic combined systolic and diastolic CHF (congestive heart failure) (HCC) 05/03/2022   Acute on chronic systolic CHF (congestive heart failure) (HCC) 06/01/2022   Anxiety    Arthritis    Chest pain 01/22/2023   CHF (congestive heart failure) (HCC)    Depression    Diabetes mellitus without complication (HCC)    GERD (gastroesophageal reflux disease)    Glaucoma    both eyes   Hypertension    Hypokalemia 05/03/2022   Positive D dimer 06/01/2022   Sleep apnea    uses Cpap nightly    Past Surgical History:  Procedure Laterality Date   CARPAL TUNNEL RELEASE Bilateral    COLONOSCOPY     EYE SURGERY Bilateral    cataract surgery with lens implants   ICD IMPLANT N/A 01/28/2023   Procedure: ICD IMPLANT;  Surgeon: Lanier Prude, MD;  Location: [facility] INVASIVE CV LAB;  Service: Cardiovascular;  Laterality: N/A;   KNEE ARTHROPLASTY Right    KNEE ARTHROPLASTY Left    KNEE ARTHROSCOPY Right    KNEE ARTHROSCOPY Left    MULTIPLE TOOTH EXTRACTIONS     RIGHT/LEFT HEART CATH AND CORONARY ANGIOGRAPHY Bilateral 05/03/2022    Procedure: RIGHT/LEFT HEART CATH AND CORONARY ANGIOGRAPHY;  Surgeon: Marykay Lex, MD;  Location: [facility] INVASIVE CV LAB;  Service: Cardiovascular;  Laterality: Bilateral;   ROTATOR CUFF REPAIR Right    ROTATOR CUFF REPAIR W/ DISTAL CLAVICLE EXCISION Left    TOTAL HIP ARTHROPLASTY Right 05/11/2019   Procedure: RIGHT TOTAL HIP ARTHROPLASTY ANTERIOR APPROACH;  Surgeon: Tarry Kos, MD;  Location: [facility] OR;  Service: Orthopedics;  Laterality: Right;   TOTAL HIP ARTHROPLASTY Left 11/23/2019   TOTAL HIP ARTHROPLASTY Left 11/23/2019   Procedure: LEFT TOTAL HIP ARTHROPLASTY ANTERIOR APPROACH;  Surgeon: Tarry Kos, MD;  Location: [facility] OR;  Service: Orthopedics;  Laterality: Left;   VASECTOMY       reports that he quit smoking about 3 years ago. His smoking use included cigarettes. He has never used smokeless tobacco. He reports that he does not currently use alcohol. He reports that he does not currently use drugs.  Family History  Problem Relation Age of Onset   Hypertension Mother    Diabetes Mother    Heart disease Mother    Heart disease Father    Heart disease Brother      Physical Exam: Vitals:   04/14/23 2100  BP: (!) 154/85  Pulse: 70  Resp: 18  Temp: 97.6 F (36.4 C)  TempSrc: Oral  SpO2: 100%  Weight: 123.5 kg  Height: 6' (1.829 m)    Gen: Awake, alert, NAD, well-appearing Neck: In cervical collar CV: Regular, normal S1, S2, no murmurs  Resp: Normal WOB, CTAB  Abd: Flat, normoactive, minimal tenderness superficially over abrasions on the left lower abdomen.  MSK: Symmetric, no deformities.  Shoulders with restricted range of motion but painless (patient states chronic), minimal tenderness to palpation bilaterally.  There is tenderness over the lateral aspect of the left elbow but ranges without pain.  Hips knees and ankles with normal and painless range of motion.  Over the left lateral thigh there is a abrasion with superficial tenderness.  Appears to have muscle spasm  involving left thigh.  Skin: No rashes or lesions to exposed skin  Neuro: Alert and interactive, fully oriented, motor 5 out of 5 and symmetric in the upper extremities and lower extremities.  On sensory exam there is a patch of hypoesthesia in the left arm on the lateral aspect of the forearm (near C5 distribution), and in the left leg on the lateral aspect of the knee and medial ankle (near L4-5); otherwise sensation is intact within C5-T1 and L1 to S1.  DTRs 2+ patellar, no clonus at the ankles Psych: Anxious but appropriate   Data review:   Labs reviewed, notable for:   Creatinine 1.76, baseline 1.2-1.3 Platelet 137  Micro:  Results for orders placed or performed during the hospital encounter of 11/19/19  SARS CORONAVIRUS 2 (TAT 6-24 HRS) Nasopharyngeal Nasopharyngeal Swab     Status: None   Collection Time: 11/19/19 12:38 PM   Specimen: Nasopharyngeal Swab  Result Value Ref Range Status   SARS Coronavirus 2 NEGATIVE NEGATIVE Final    Comment: (NOTE) SARS-CoV-2 target nucleic acids are NOT DETECTED. The SARS-CoV-2 RNA is generally detectable in upper and lower respiratory specimens during the acute phase of infection. Negative results do not preclude SARS-CoV-2 infection, do not rule out co-infections with other pathogens, and should not be used as the sole basis for treatment or other patient management decisions. Negative results must be combined with clinical observations, patient history, and epidemiological information. The expected result is Negative. Fact Sheet for Patients: HairSlick.no Fact Sheet for Healthcare Providers: quierodirigir.com This test is not yet approved or cleared by the [place] FDA and  has been authorized for detection and/or diagnosis of SARS-CoV-2 by FDA under an Emergency Use Authorization (EUA). This EUA will remain  in effect (meaning this test can be used) for the duration of  the COVID-19 declaration under Section 56 4(b)(1) of the Act, 21 U.S.C. section 360bbb-3(b)(1), unless the authorization is terminated or revoked sooner. Performed at [facility], [address], [place], [place] [address]     Imaging reviewed:  CT ABDOMEN PELVIS W CONTRAST  Result  Date: 04/14/2023 CLINICAL DATA:  Abdominal pain. Left lower extremity and left hip pain. EXAM: CT ABDOMEN AND PELVIS WITH CONTRAST TECHNIQUE: Multidetector CT imaging of the abdomen and pelvis was performed using the standard protocol following bolus administration of intravenous contrast. RADIATION DOSE REDUCTION: This exam was performed according to the departmental dose-optimization program which includes automated exposure control, adjustment of the mA and/or kV according to patient size and/or use of iterative reconstruction technique. CONTRAST:  80mL OMNIPAQUE IOHEXOL 300 MG/ML  SOLN COMPARISON:  None Available. FINDINGS: Lower chest: Minimal bibasilar dependent atelectasis. There is coronary vascular calcification. AICD lead noted. There is a 7.5 cm right cardiophrenic subpleural bleb. Faint area of lower attenuation in the right lower lobe pulmonary artery (11/2) may be artifactual. If there is clinical concern for pulmonary embolism, further evaluation with chest CT is recommended. No intra-abdominal free air or free fluid. Hepatobiliary: Ill-defined 2 cm enhancing focus in the left lobe of the liver (18/2) is not characterized on this CT but may represent a flash filling hemangioma or portal venous shunting. Attention on follow-up imaging or further evaluation with ultrasound on a nonemergent/outpatient basis recommended. Several additional subcentimeter hypodense lesions are too small to characterize. No biliary dilatation the gallbladder is unremarkable. Pancreas: Unremarkable. No pancreatic ductal dilatation or surrounding inflammatory changes. Spleen: Normal in size without focal abnormality.  Adrenals/Urinary Tract: Indeterminate bilateral adrenal nodules measure up to 1.5 cm on the right, possibly adenoma. These can be better evaluated with MRI on a nonemergent/outpatient basis, if clinically indicated. Small right renal inferior pole cyst as well as additional left renal subcentimeter hypodense lesions which are too small to characterize. There is no hydronephrosis on either side. There is symmetric enhancement and excretion of contrast by both kidneys. The visualized ureters and urinary bladder appear unremarkable. Stomach/Bowel: There is sigmoid diverticulosis and scattered colonic diverticula without active inflammatory changes. There is no bowel obstruction or active inflammation. The appendix is normal. Vascular/Lymphatic: Mild aortoiliac atherosclerotic disease. The IVC is unremarkable. No portal venous gas. No adenopathy. Reproductive: The prostate and seminal vessels are grossly unremarkable. No pelvic mass. Other: Small fat containing umbilical hernia. Musculoskeletal: Osteopenia with degenerative changes of the spine. Bilateral total hip arthroplasty. No acute osseous pathology. IMPRESSION: 1. No acute intra-abdominal or pelvic pathology. 2. Colonic diverticulosis. No bowel obstruction. Normal appendix. 3. Artifact versus less likely a right lower lobe pulmonary artery embolus. CT angiography of the chest may provide better evaluation if there is clinical concern for acute PE. 4.  Aortic Atherosclerosis (ICD10-I70.0). Electronically Signed   By: Elgie Collard M.D.   On: 04/14/2023 17:03   DG Shoulder Left  Result Date: 04/14/2023 CLINICAL DATA:  Pain post fall EXAM: LEFT SHOULDER - 2+ VIEW COMPARISON:  None Available. FINDINGS: Overlying hardware degrades images. Left subclavian transvenous pacing generator. DJD in the acromioclavicular articulation. Corticated ossicles lateral to the acromion. No definite acute fracture. No dislocation. Normal mineralization. IMPRESSION: 1. No  definite acute findings. 2. AC joint DJD. Electronically Signed   By: Corlis Leak M.D.   On: 04/14/2023 14:51   DG Chest Portable 1 View  Result Date: 04/14/2023 CLINICAL DATA:  Pain post fall EXAM: PORTABLE CHEST - 1 VIEW COMPARISON:  01/29/2023 FINDINGS: Lungs are clear.  Stable left subclavian AICD. Heart size and mediastinal contours are within normal limits. Aortic Atherosclerosis (ICD10-170.0). No effusion. Visualized bones unremarkable. IMPRESSION: No acute findings. Electronically Signed   By: Corlis Leak M.D.   On: 04/14/2023 14:41   DG Hip Unilat  W or Wo Pelvis 2-3 Views Left  Result Date: 04/14/2023 CLINICAL DATA:  Leg pain after fall.  Rule out fracture. EXAM: DG HIP (WITH OR WITHOUT PELVIS) 2-3V LEFT COMPARISON:  01/05/2020 FINDINGS: Postop change from bilateral total hip arthroplasty. Both hips appear located. There is no sign of left hip fracture. Degenerative disc disease identified within the lumbar spine. IMPRESSION: 1. No acute findings. 2. Bilateral total hip arthroplasty. Electronically Signed   By: Signa Kell M.D.   On: 04/14/2023 14:39   CT HEAD WO CONTRAST ( )  Result Date: 04/14/2023 CLINICAL DATA:  Fall, pain EXAM: CT HEAD WITHOUT CONTRAST CT CERVICAL SPINE WITHOUT CONTRAST TECHNIQUE: Multidetector CT imaging of the head and cervical spine was performed following the standard protocol without intravenous contrast. Multiplanar CT image reconstructions of the cervical spine were also generated. RADIATION DOSE REDUCTION: This exam was performed according to the departmental dose-optimization program which includes automated exposure control, adjustment of the mA and/or kV according to patient size and/or use of iterative reconstruction technique. COMPARISON:  None Available. FINDINGS: CT HEAD FINDINGS Brain: No evidence of acute infarction, hemorrhage, hydrocephalus, extra-axial collection or mass lesion/mass effect. Periventricular white matter hypodensity. Vascular: No  hyperdense vessel or unexpected calcification. Skull: Normal. Negative for fracture or focal lesion. Sinuses/Orbits: No acute finding. Other: None. CT CERVICAL SPINE FINDINGS Alignment: Degenerative straightening of the normal cervical lordosis. Skull base and vertebrae: No acute fracture. No primary bone lesion or focal pathologic process. Soft tissues and spinal canal: No prevertebral fluid or swelling. No visible canal hematoma. Disc levels: Severe multilevel bridging osteophytosis and ankylosis throughout the cervical spine from C3-C7 in keeping with advanced DISH. Upper chest: Negative. Other: None. IMPRESSION: 1. No acute intracranial pathology. Small-vessel white matter disease. 2. No fracture or static subluxation of the cervical spine. 3. Severe multilevel bridging osteophytosis and ankylosis throughout the cervical spine from C3-C7 in keeping with advanced DISH. Electronically Signed   By: Jearld Lesch M.D.   On: 04/14/2023 14:04   CT Cervical Spine Wo Contrast  Result Date: 04/14/2023 CLINICAL DATA:  Fall, pain EXAM: CT HEAD WITHOUT CONTRAST CT CERVICAL SPINE WITHOUT CONTRAST TECHNIQUE: Multidetector CT imaging of the head and cervical spine was performed following the standard protocol without intravenous contrast. Multiplanar CT image reconstructions of the cervical spine were also generated. RADIATION DOSE REDUCTION: This exam was performed according to the departmental dose-optimization program which includes automated exposure control, adjustment of the mA and/or kV according to patient size and/or use of iterative reconstruction technique. COMPARISON:  None Available. FINDINGS: CT HEAD FINDINGS Brain: No evidence of acute infarction, hemorrhage, hydrocephalus, extra-axial collection or mass lesion/mass effect. Periventricular white matter hypodensity. Vascular: No hyperdense vessel or unexpected calcification. Skull: Normal. Negative for fracture or focal lesion. Sinuses/Orbits: No acute  finding. Other: None. CT CERVICAL SPINE FINDINGS Alignment: Degenerative straightening of the normal cervical lordosis. Skull base and vertebrae: No acute fracture. No primary bone lesion or focal pathologic process. Soft tissues and spinal canal: No prevertebral fluid or swelling. No visible canal hematoma. Disc levels: Severe multilevel bridging osteophytosis and ankylosis throughout the cervical spine from C3-C7 in keeping with advanced DISH. Upper chest: Negative. Other: None. IMPRESSION: 1. No acute intracranial pathology. Small-vessel white matter disease. 2. No fracture or static subluxation of the cervical spine. 3. Severe multilevel bridging osteophytosis and ankylosis throughout the cervical spine from C3-C7 in keeping with advanced DISH. Electronically Signed   By: Jearld Lesch M.D.   On: 04/14/2023 14:04    EKG:  Sinus rhythm, poor R wave progression, intraventricular conduction delay, biphasic T wave inferolaterally  ED Course:  At [facility] imaging obtained including above.  There was no acute pathology on CT head or C-spine.  Incidental finding of DISH in the C-spine.  Per review of outside records patient had complained of left-sided numbness and weakness however per their examination there were no focal deficits.  Outside ED provider had discussed with neurosurgery and due to concern for a cord injury had been recommended for MRI which needed to be arranged with his AICD; see additional discussion below re: evaluation here and updated plan  Cervical collar clearance: I am clearing the patient from cervical collar utilizing following criteria -Alert patient with reliable neurologic exam -No high risk features of mechanism of injury -Neurologic sign with patchy hypoesthesia as described in exam not consistent with spinal cord injury.  -No acute abnormalities on CT C-spine (incidental DISH)  -No severe posterior neck pain or midline tenderness -Able to range neck 45 degrees to the left and  right  Assessment/Plan:  72 y.o. male with hx heart failure with reduced ejection fraction, ICD, CVA with no residual deficits, hypertension, hyperlipidemia, diabetes, CKD 3, OSA, hip and knee replacements, who was transferred from [facility] ED after ground-level fall due to concern for possible spinal cord injury without radiographic findings.   Ground-level fall, mechanical See additional description of fall and history above.  Evaluation for associated traumatic injuries demonstrates no significant trauma.  CT of the head with no acute abnormalities, CT of the C-spine without acute abnormalities, incidental finding of DISH, x-ray of the hips, left shoulder, chest x-ray also without acute injuries.  Will obtain x-ray of the left elbow here.  - XR L elbow  - Symptomatic management: Tylenol, robaxin prn  - PT evaluation   Previous concern for spinal cord injury without radiographic findings  He was originally transferred from [facility] to [facility] for concern for possible spinal cord injury without radiographic findings. Per review of outside records patient had complained of left-sided numbness and weakness however per their examination there were no focal deficits.  Outside ED provider had discussed with neurosurgery and due to concern for a cord injury had been recommended for MRI which needed to be arranged with his AICD. However on my evaluation he has no subjective symptoms of neurologic issue, and on my exam has patches of hypoesthesia near C5 and also L4-5 on the left. However other dermatomes intact, motor intact. No UMN signs. Overall these findings are not concerning for a spinal cord injury.  - I have cleared him from Cervical collar, see above.  - I discussed his case with NSGY on call, Dr. Vick Frees. Based on my evaluation she agrees that this does not seem consistent with spinal cord imaging. Feels outpatient follow-up is appropriate.   AKI stage I  Background CKD 3 Baseline creatinine  approximately 1.2-1.3, although fluctuates.  Elevated to 1.76 on admission.  Suspect mild prerenal with decreased intake in light of his fall and being n.p.o. -Oral hydration -Repeat BMP in a.m.  Chronic medical problems:  Heart failure with reduced ejection fraction, ICD, without acute exacerbation: Continue home Coreg, spironolactone, mexilitine.  Hold his torsemide and Entresto with borderline AKI Hx CVA: continue aspirin, no recent fills for statin but would resume  Diabetes: SSI while inpatient Hypertension: See meds and heart failure above Hyperlipidemia: No recent fills for statin OSA:   Body mass index is 36.93 kg/m. Obesity class II affecting medical care  DVT prophylaxis:  SCDs Code Status:  Full Code Diet:  Diet Orders (From admission, onward)     Start     Ordered   04/14/23 2301  Diet Heart Room service appropriate? Yes; Fluid consistency: Thin  Diet effective now       Question Answer Comment  Room service appropriate? Yes   Fluid consistency: Thin      04/14/23 2301           Family Communication:  No   Consults:  Discussed case with neurosurgery   Admission status:   Observation, Med-Surg  Severity of Illness: The appropriate patient status for this patient is OBSERVATION. Observation status is judged to be reasonable and necessary in order to provide the required intensity of service to ensure the patient's safety. The patient's presenting symptoms, physical exam findings, and initial radiographic and laboratory data in the context of their medical condition is felt to place them at decreased risk for further clinical deterioration. Furthermore, it is anticipated that the patient will be medically stable for discharge from the hospital within 2 midnights of admission.    Dolly Rias, MD [facility]  How to contact the [facility] Attending or Consulting provider 7A - 7P or covering provider during after hours 7P -7A, for this patient.  Check the  care team in [facility] and look for a) attending/consulting [facility] provider listed and b) the [facility] team listed Log into [email or web address] and use [place]'s universal password to access. If you do not have the password, please contact the hospital operator. Locate the [facility] provider you are looking for under [facility] and page to a number that you can be directly reached. If you still have difficulty reaching the provider, please page the [facility] (Director on Call) for the Hospitalists listed on amion for assistance.  04/15/2023, 12:19 AM

## 2023-04-15 NOTE — Evaluation (Signed)
Occupational Therapy Evaluation Patient Details Name: Erik Black MRN: [id number] DOB: 02/09/1951 Today's Date: 04/15/2023   History of Present Illness Pt is a 72 y/o male presenting on 10/13 after fall at the carwash falling through a grate.  Imaging negative, transferred to [facility] for MRI due to concern for SCI but per neurosurgery not indicated. PMH includes: CHF, anxiety, arthritis, DM, glaucoma, HTN, sleep apnea, bil knee surgery, bil rotator cuff repair, bil THA.   Clinical Impression   Patient admitted for above and presents near baseline at modified independent level for ADLs, transfers and functional mobility. Cognition, sensation, coordination, and vision are [facility]; pt reports baseline L UE mild decreased sensation and decreased short term recall.  No further OT needs have been identified, pt has no further questions or concerns, and OT will sign off. Thank you for this referral!        If plan is discharge home, recommend the following:      Functional Status Assessment     Equipment Recommendations  None recommended by OT    Recommendations for Other Services       Precautions / Restrictions Precautions Precautions: Fall Restrictions Weight Bearing Restrictions: No      Mobility Bed Mobility               General bed mobility comments: OOB in recliner    Transfers Overall transfer level: Independent Equipment used: None                      Balance Overall balance assessment: No apparent balance deficits (not formally assessed)                                         ADL either performed or assessed with clinical judgement   ADL Overall ADL's : Modified independent;At baseline                                             Vision   Vision Assessment?: No apparent visual deficits     Perception         Praxis         Pertinent Vitals/Pain Pain Assessment Pain Assessment: 0-10 Pain Score: 5   Pain Location: B elbow, abdomen, LLE- generalized Pain Descriptors / Indicators: Discomfort, Sore Pain Intervention(s): Limited activity within patient's tolerance, Monitored during session, Repositioned     Extremity/Trunk Assessment Upper Extremity Assessment Upper Extremity Assessment: Overall WFL for tasks assessed (reports L UE with minimal numbness/tingling at baseline, no different tahn baseline)   Lower Extremity Assessment Lower Extremity Assessment: Defer to PT evaluation       Communication Communication Communication: No apparent difficulties   Cognition Arousal: Alert Behavior During Therapy: WFL for tasks assessed/performed Overall Cognitive Status: Within Functional Limits for tasks assessed                                 General Comments: pt and spouse report baseline cognition, on short blessed test poor recall of name/address but pt reports this is baseline.     General Comments  Slightly diminshed sensation along C6 and T1 dermatome on L side. Also noted diminished sensation on entire LLE.    Exercises  Shoulder Instructions      Home Living Family/patient expects to be discharged to:: Private residence Living Arrangements: Spouse/significant other Available Help at Discharge: Available 24 hours/day;Family Type of Home: House Home Access: Stairs to enter [employer] of Steps: 3 Entrance Stairs-Rails: Right;Left Home Layout: One level     Bathroom Shower/Tub: Tub/shower unit (portable shower they setup in the kitchen)   Bathroom Toilet: Handicapped height     Home Equipment: Grab bars - toilet;Rollator (4 wheels);Cane - quad;Cane - single point;Shower seat;Hand held shower head;Toilet riser;Other (comment) (portable shower)          Prior Functioning/Environment Prior Level of Function : Independent/Modified Independent;Driving             Mobility Comments: (I), no AD. ADLs Comments: (I) with ADL/IADL.  Spouse completes med mgmt but pt completes fiances. Enjoys church and [employer]. Has a tractor and mows the yard and churchyard.        OT Problem List:        OT Treatment/Interventions:      OT Goals(Current goals can be found in the care plan section) Acute Rehab OT Goals Patient Stated Goal: home OT Goal Formulation: With patient  OT Frequency:      Co-evaluation              AM-PAC OT "6 Clicks" Daily Activity     Outcome Measure Help from another person eating meals?: None Help from another person taking care of personal grooming?: None Help from another person toileting, which includes using toliet, bedpan, or urinal?: None Help from another person bathing (including washing, rinsing, drying)?: None Help from another person to put on and taking off regular upper body clothing?: None Help from another person to put on and taking off regular lower body clothing?: None 6 Click Score: 24   End of Session Nurse Communication: Mobility status  Activity Tolerance: Patient tolerated treatment well Patient left: in chair;with call bell/phone within reach;with family/visitor present  OT Visit Diagnosis: Other abnormalities of gait and mobility (R26.89)                Time: 1002-1020 OT Time Calculation (min): 18 min Charges:  OT General Charges $OT Visit: 1 Visit OT Evaluation $OT Eval Low Complexity: 1 Low  Barry Brunner, OT Acute Rehabilitation Services Office [phone]   Chancy Milroy 04/15/2023, 10:36 AM

## 2023-04-16 ENCOUNTER — Ambulatory Visit (INDEPENDENT_AMBULATORY_CARE_PROVIDER_SITE_OTHER): Payer: Self-pay | Admitting: Internal Medicine

## 2023-04-30 ENCOUNTER — Ambulatory Visit (INDEPENDENT_AMBULATORY_CARE_PROVIDER_SITE_OTHER): Payer: Self-pay | Admitting: Internal Medicine

## 2023-04-30 ENCOUNTER — Ambulatory Visit (INDEPENDENT_AMBULATORY_CARE_PROVIDER_SITE_OTHER): Payer: Medicare HMO

## 2023-04-30 DIAGNOSIS — I428 Other cardiomyopathies: Secondary | ICD-10-CM

## 2023-04-30 DIAGNOSIS — I5022 Chronic systolic (congestive) heart failure: Secondary | ICD-10-CM

## 2023-04-30 NOTE — Progress Notes (Unsigned)
Electrophysiology Office Follow up Visit Note:    Date:  05/01/2023   ID:  Erik Black, DOB [birth year], MRN [id number]  PCP:  [facility] Cardiologist:  Erik Odea, MD  [facility] Electrophysiologist:  Erik Prude, MD    Interval History:     Erik Black is a 72 y.o. male who presents for a follow up visit.   Discussed the use of AI scribe software for clinical note transcription with the patient, who gave verbal consent to proceed.  History of Present Illness   Erik Black, with a history of chronic systolic heart failure, ischemic cardiomyopathy, and PVCs, presents for follow up after a single chamber ICD implant in July 2024. He was recently hospitalized after a fall at a self car wash. He stepped on a grate which flipped up, causing him to fall and sustain multiple scrapes and bruises. He was initially concerned about spinal cord injury and movement of the ICD, but symptoms improved and the ICD appears unaffected. He reports ongoing body-wide soreness since the fall. He also mentions restarting physical therapy for a rotator cuff issue that had been on hold due to heart problems. He recently discovered that his CPAP mask was not compatible with the ICD due to metal components, but this has been rectified with a new mask.            Past medical, surgical, social and family history were reviewed.  ROS:   Please see the history of present illness.    All other systems reviewed and are negative.  EKGs/Labs/Other Studies Reviewed:    The following studies were reviewed today:       Physical Exam:    VS:  BP 118/78 (BP Location: Left Arm, Patient Position: Sitting, Cuff Size: Large)   Pulse 94   Ht 6' (1.829 m)   Wt 274 lb (124.3 kg)   SpO2 93%   BMI 37.16 kg/m     Wt Readings from Last 3 Encounters:  05/01/23 274 lb (124.3 kg)  04/14/23 272 lb 4.3 oz (123.5 kg)  04/14/23 270 lb (122.5 kg)     GEN:  Well  nourished, well developed in no acute distress CARDIAC: RRR, no murmurs, rubs, gallops RESPIRATORY:  Clear to auscultation without rales, wheezing or rhonchi       ASSESSMENT:    1. NICM (nonischemic cardiomyopathy) (HCC)   2. Primary hypertension   3. Chronic systolic heart failure (HCC)   4. ICD (implantable cardioverter-defibrillator) in place   5. PVC's (premature ventricular contractions)    PLAN:    In order of problems listed above:  Assessment and Plan    Fall with minor injuries Recent fall with multiple contusions and abrasions. No evidence of device displacement or malfunction. No fractures reported. -Continue current management and physical therapy for rotator cuff injury.  ICD for primary prevention Medtronic single chamber ICD implanted on 01/28/2023. No evidence of device displacement or malfunction after recent fall. -Continue remote monitoring of the device.  Chronic systolic heart failure and ischemic cardiomyopathy NYHA class 2-3. Warm and dry on exam. -Continue Coreg, Entresto, spironolactone, and torsemide.  Premature ventricular contractions (PVCs) -Continue Coreg and mexiletine.  Hypertension -Continue amlodipine, Coreg, Entresto, spironolactone, and torsemide.  Follow-up in one year with an APP.               Signed, Erik Dunn, MD, [facility], [facility] 05/01/2023 10:17 AM    Electrophysiology [facility]

## 2023-05-01 ENCOUNTER — Encounter: Payer: Self-pay | Admitting: Cardiology

## 2023-05-01 ENCOUNTER — Ambulatory Visit: Payer: Medicare HMO | Attending: Cardiology | Admitting: Cardiology

## 2023-05-01 ENCOUNTER — Other Ambulatory Visit: Payer: Self-pay

## 2023-05-01 VITALS — BP 118/78 | HR 94 | Ht 72.0 in | Wt 274.0 lb

## 2023-05-01 DIAGNOSIS — I10 Essential (primary) hypertension: Secondary | ICD-10-CM

## 2023-05-01 DIAGNOSIS — Z95810 Presence of automatic (implantable) cardiac defibrillator: Secondary | ICD-10-CM | POA: Diagnosis not present

## 2023-05-01 DIAGNOSIS — I493 Ventricular premature depolarization: Secondary | ICD-10-CM

## 2023-05-01 DIAGNOSIS — I5022 Chronic systolic (congestive) heart failure: Secondary | ICD-10-CM | POA: Diagnosis not present

## 2023-05-01 DIAGNOSIS — I428 Other cardiomyopathies: Secondary | ICD-10-CM

## 2023-05-01 LAB — CUP PACEART INCLINIC DEVICE CHECK
Date Time Interrogation Session: 20241030102016
Implantable Lead Connection Status: 753985
Implantable Lead Implant Date: 20240729
Implantable Lead Location: 753860
Implantable Pulse Generator Implant Date: 20240729

## 2023-05-01 NOTE — Patient Instructions (Signed)
 Medication Instructions:  Your physician recommends that you continue on your current medications as directed. Please refer to the Current Medication list given to you today.  *If you need a refill on your cardiac medications before your next appointment, please call your pharmacy*  Follow-Up: At [facility], you and your health needs are our priority.  As part of our continuing mission to provide you with exceptional heart care, we have created designated Provider Care Teams.  These Care Teams include your primary Cardiologist (physician) and Advanced Practice Providers (APPs -  Physician Assistants and Nurse Practitioners) who all work together to provide you with the care you need, when you need it.  Your next appointment:   1 year  Provider:   You will see one of the following Advanced Practice Providers on your designated Care Team:   Francis Dowse, Charlott Holler [address]" [place], [place] Sherie Don, NP Canary Brim, NP

## 2023-05-02 LAB — CUP PACEART REMOTE DEVICE CHECK
Battery Remaining Longevity: 168 mo
Battery Voltage: 3.1 V
Brady Statistic RA Percent Paced: INVALID
Brady Statistic RV Percent Paced: 0 %
Date Time Interrogation Session: 20241028232949
HighPow Impedance: 69 Ohm
Implantable Lead Connection Status: 753985
Implantable Lead Implant Date: 20240729
Implantable Lead Location: 753860
Implantable Pulse Generator Implant Date: 20240729
Lead Channel Impedance Value: 589 Ohm
Lead Channel Impedance Value: 684 Ohm
Lead Channel Pacing Threshold Amplitude: 0.25 V
Lead Channel Pacing Threshold Pulse Width: 0.4 ms
Lead Channel Sensing Intrinsic Amplitude: 7.5 mV
Lead Channel Setting Pacing Amplitude: 1.5 V
Lead Channel Setting Pacing Pulse Width: 0.4 ms
Lead Channel Setting Sensing Sensitivity: 0.3 mV
Zone Setting Status: 755011

## 2023-05-16 ENCOUNTER — Encounter (INDEPENDENT_AMBULATORY_CARE_PROVIDER_SITE_OTHER): Payer: Self-pay | Admitting: *Deleted

## 2023-05-20 ENCOUNTER — Encounter (INDEPENDENT_AMBULATORY_CARE_PROVIDER_SITE_OTHER): Payer: Self-pay

## 2023-05-21 NOTE — Progress Notes (Signed)
Remote ICD transmission.   

## 2023-05-22 ENCOUNTER — Encounter (INDEPENDENT_AMBULATORY_CARE_PROVIDER_SITE_OTHER): Payer: Self-pay | Admitting: Internal Medicine

## 2023-05-22 ENCOUNTER — Ambulatory Visit (INDEPENDENT_AMBULATORY_CARE_PROVIDER_SITE_OTHER): Payer: Medicare HMO | Admitting: Internal Medicine

## 2023-05-22 VITALS — BP 110/70 | HR 82 | Temp 97.7°F | Ht 69.0 in | Wt 271.0 lb

## 2023-05-22 DIAGNOSIS — Z1331 Encounter for screening for depression: Secondary | ICD-10-CM

## 2023-05-22 DIAGNOSIS — G4733 Obstructive sleep apnea (adult) (pediatric): Secondary | ICD-10-CM

## 2023-05-22 DIAGNOSIS — Z6841 Body Mass Index (BMI) 40.0 and over, adult: Secondary | ICD-10-CM

## 2023-05-22 DIAGNOSIS — R0602 Shortness of breath: Secondary | ICD-10-CM | POA: Diagnosis not present

## 2023-05-22 DIAGNOSIS — I130 Hypertensive heart and chronic kidney disease with heart failure and stage 1 through stage 4 chronic kidney disease, or unspecified chronic kidney disease: Secondary | ICD-10-CM

## 2023-05-22 DIAGNOSIS — R5383 Other fatigue: Secondary | ICD-10-CM

## 2023-05-22 DIAGNOSIS — I5020 Unspecified systolic (congestive) heart failure: Secondary | ICD-10-CM | POA: Diagnosis not present

## 2023-05-22 DIAGNOSIS — E66813 Obesity, class 3: Secondary | ICD-10-CM

## 2023-05-22 DIAGNOSIS — R948 Abnormal results of function studies of other organs and systems: Secondary | ICD-10-CM

## 2023-05-22 DIAGNOSIS — E1122 Type 2 diabetes mellitus with diabetic chronic kidney disease: Secondary | ICD-10-CM | POA: Insufficient documentation

## 2023-05-22 DIAGNOSIS — Z7985 Long-term (current) use of injectable non-insulin antidiabetic drugs: Secondary | ICD-10-CM

## 2023-05-22 DIAGNOSIS — I2510 Atherosclerotic heart disease of native coronary artery without angina pectoris: Secondary | ICD-10-CM

## 2023-05-22 DIAGNOSIS — Z794 Long term (current) use of insulin: Secondary | ICD-10-CM

## 2023-05-22 DIAGNOSIS — I10 Essential (primary) hypertension: Secondary | ICD-10-CM

## 2023-05-22 DIAGNOSIS — N1831 Chronic kidney disease, stage 3a: Secondary | ICD-10-CM

## 2023-05-22 NOTE — Assessment & Plan Note (Signed)
On CPAP with reported good compliance. Continue PAP therapy. Losing 15% or more of body weight may improve AHI.    

## 2023-05-22 NOTE — Assessment & Plan Note (Signed)
Patient has a slower than predicted metabolism. IC 1901 vs. calculated 2223. This may contribute to weight gain, chronic fatigue and difficulty losing weight.   We reviewed measures to improve metabolism including not skipping meals, progressive strengthening exercises, increasing protein intake at every meal and maintaining adequate hydration and sleep.

## 2023-05-22 NOTE — Assessment & Plan Note (Signed)
Blood pressure is well-controlled.  He is on spironolactone, carvedilol and Entresto.  Most recent renal parameters showed a decrease in GFR likely secondary to medications.  Patient counseled on avoidance of nephrotoxins and maintaining adequate hydration.

## 2023-05-22 NOTE — Assessment & Plan Note (Signed)
I reviewed heart catheterization he has nonobstructive CAD.  He is on aspirin and on atorvastatin 40 mg a day.  He may benefit from high intensity statin therapy.  We will assess cardiovascular risk further at the next office visit and make more recommendations.

## 2023-05-22 NOTE — Progress Notes (Signed)
Office: [phone]  /  Fax: [phone]   Subjective   Initial Visit  Erik Black (MR# [id number]) is an 72 y.o. male who presents for evaluation and treatment of obesity and related comorbidities. Current BMI is Body mass index is 40.02 kg/m. [name] has been struggling with his weight for many years and has been unsuccessful in either losing weight, maintaining weight loss, or reaching his healthy weight goal.  [name] is currently in the action stage of change and ready to dedicate time achieving and maintaining a healthier weight. [name] is interested in becoming our patient and working on intensive lifestyle modifications including (but not limited to) diet and exercise for weight loss.  When asked how their weight has affected their life and health, he states: Contributed to medical problems, Contributed to orthopedic problems or mobility issues, Having fatigue, and Having poor endurance  When asked what else they would like to accomplish? 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  Life events associated with weight gain include :  millitary .   Other contributing factors: Family history of obesity, Disruption of circadian rhythm / sleep disordered breathing, Consumption of processed foods, and Use of obesogenic medications: Psychotropic medications.  Their highest weight has been:  310 lbs.  Previous weight-loss programs : None.  Their maximum weight loss was:  na lbs.  Their greatest challenge with dieting: difficulty maintaining reduced calorie state.  Weight promoting medications identified: None  Current or previous pharmacotherapy: GLP-1 and  Phentermine.  Response to medication: Lost weight and was able to maintain weight loss  Nutritional History:  Current nutrition plan: None.  How often do they eat breakfast :  skips lunch, no desire to eat .  Number of times they eat per day: 2  What beverages do they drink: regular soda 1-2 per week, juice 1-2 per week, and sweet tea 2-4 per week.   Use of artificial sweetners : No  Food intolerances or restrictions: none.  Food triggers: None.  Food cravings:  fruits  Current level of physical activity: None  Past medical history includes:   Past Medical History:  Diagnosis Date   Acute on chronic combined systolic and diastolic CHF (congestive heart failure) (HCC) 05/03/2022   Acute on chronic systolic CHF (congestive heart failure) (HCC) 06/01/2022   Anxiety    Arthritis    Chest pain 01/22/2023   CHF (congestive heart failure) (HCC)    Depression    Diabetes mellitus without complication (HCC)    GERD (gastroesophageal reflux disease)    Glaucoma    both eyes   Hypertension    Hypokalemia 05/03/2022   Positive D dimer 06/01/2022   Sleep apnea    uses Cpap nightly     Objective   BP 110/70   Pulse 82   Temp 97.7 F (36.5 C)   Ht 5\' 9"  (1.753 m)   Wt 271 lb (122.9 kg)   SpO2 98%   BMI 40.02 kg/m  He was weighed on the bioimpedance scale: Body mass index is 40.02 kg/m.    Anthropometrics:  Vitals Temp: 97.7 F (36.5 C) BP: 110/70 Pulse Rate: 82 SpO2: 98 %   Anthropometric Measurements Height: 5\' 9"  (1.753 m)  Weight: 271 lb (122.9 kg) BMI (Calculated): 40 Starting Weight: 271 lb Peak Weight: 325 lb Waist Measurement : 52 inches   Body Composition  Body Fat %: 40.8 % Fat Mass (lbs): 110.8 lbs Muscle Mass (lbs): 152.8 lbs Total Body Water (lbs): 110.6 lbs Visceral Fat Rating : 27   Other Clinical Data RMR: 1901 Fasting: Yes Labs: Yes Today's Visit #: 1 Starting Date: 05/22/23    Physical Exam:  General: He is overweight,  cooperative, alert, well developed, and in no acute distress. PSYCH: Has normal mood, affect and thought process.   HEENT: EOMI, sclerae are anicteric. Lungs: Normal breathing effort, no conversational dyspnea. Extremities: No edema.  Neurologic: No gross sensory or motor deficits. No tremors or fasciculations noted.    Diagnostic Data Reviewed  EKG: Normal sinus rhythm, rate 70 bpm.  Indirect Calorimeter completed today shows a VO2 of 276 and a REE of 1901.  His calculated basal metabolic rate is 9528 thus his resting energy expenditure slower than calculated.  Depression Screen  Aristotle's PHQ-9 score was: 0.     03/09/2022   10:01 AM  Depression screen PHQ 2/9  Decreased Interest 0  Down, Depressed, Hopeless 1  PHQ - 2 Score 1    Screening for Sleep Related Breathing Disorders  Davit denies daytime somnolence and denies waking up still tired. Patient denies a history of symptoms of OSA. Harlem generally gets 10 hours of sleep per night, and states that he has generally restful sleep. Snoring is not present. Apneic episodes are not present while using the CPAP. Epworth Sleepiness Score is 9.   BMET    Component Value Date/Time   NA 136 04/14/2023 1421   K 3.5 04/14/2023 1421   CL 104 04/14/2023 1421   CO2 25 04/14/2023 1421   GLUCOSE 115 (H) 04/14/2023 1421   BUN 19 04/14/2023 1421   CREATININE 1.76 (H) 04/14/2023 1421   CALCIUM 9.0 04/14/2023 1421   GFRNONAA 41 (L) 04/14/2023 1421   GFRAA 59 (L) 11/24/2019 0628   Lab Results  Component Value Date   HGBA1C 10.5 (H) 01/22/2023   HGBA1C 6.1 (H) 04/16/2019   No results found for: "INSULIN" CBC    Component Value Date/Time   WBC 6.6 04/14/2023 1421   RBC 4.64 04/14/2023 1421   HGB 13.5 04/14/2023 1421   HCT 40.6 04/14/2023 1421   PLT 137 (L) 04/14/2023 1421   MCV 87.5 04/14/2023 1421   MCH 29.1 04/14/2023 1421   MCHC 33.3 04/14/2023 1421   RDW 13.0 04/14/2023 1421   Iron/TIBC/Ferritin/ %Sat No  results found for: "IRON", "TIBC", "FERRITIN", "IRONPCTSAT" Lipid Panel     Component Value Date/Time   CHOL 152 01/23/2023 0526   TRIG 134 01/23/2023 0526   HDL 34 (L) 01/23/2023 0526   CHOLHDL 4.5 01/23/2023 0526   VLDL 27 01/23/2023 0526   LDLCALC 91 01/23/2023 0526   Hepatic Function Panel     Component Value Date/Time   PROT 7.2 01/22/2023 0935   ALBUMIN 4.1 01/22/2023 0935   AST 20 01/22/2023 0935   ALT 15 01/22/2023 0935   ALKPHOS 120 01/22/2023 0935   BILITOT 0.8 01/22/2023 0935      Component Value Date/Time   TSH 0.774 12/05/2022 1147     Assessment and Plan   TREATMENT PLAN FOR OBESITY:  Recommended Dietary Goals  Loid is currently in the action stage of change. As such, his goal is to implement medically supervised weight loss plan.  He has agreed  to implement: the Category 3 plan - 1500 kcal per day  Behavioral Intervention  We discussed the following Behavioral Modification Strategies today: increasing lean protein intake to established goals, decreasing simple carbohydrates , increasing vegetables, increasing lower glycemic fruits, increasing fiber rich foods, avoiding skipping meals, increasing water intake, work on meal planning and preparation, reading food labels , keeping healthy foods at home, identifying sources and decreasing liquid calories, decreasing eating out or consumption of processed foods, and making healthy choices when eating convenient foods, planning for success, and better snacking choices  Additional resources provided today:  Cat 3 packet  Recommended Physical Activity Goals  Nijel has been advised to work up to 150 minutes of moderate intensity aerobic activity a week and strengthening exercises 2-3 times per week for cardiovascular health, weight loss maintenance and preservation of muscle mass.   He has agreed to :  Think about enjoyable ways to increase daily physical activity and overcoming barriers to exercise and  Increase physical activity in their day and reduce sedentary time (increase NEAT).  Pharmacotherapy We will work on building a Therapist, art and behavioral strategies. We will discuss the role of pharmacotherapy as an adjunct at subsequent visits.   ASSOCIATED CONDITIONS ADDRESSED TODAY  Other Fatigue Christphor denies daytime somnolence and denies waking up still tired. Patient denies a history of symptoms of OSA. Heywood generally gets 10 hours of sleep per night, and states that he has generally restful sleep. Snoring is not present. Apneic episodes are not present while using the CPAP. Epworth Sleepiness Score is 9. Gelene Mink does not feel that his weight is causing his energy to be lower than it should be. Fatigue may be related to obesity, depression or many other causes. Labs will be ordered, and in the meanwhile, Braycen will focus on self care including making healthy food choices, increasing physical activity and focusing on stress reduction.  Shortness of Breath Estin notes increasing shortness of breath with exercising and seems to be worsening over time with weight gain. He notes getting out of breath sooner with activity than he used to. This has not gotten worse recently. Kyven denies shortness of breath at rest or orthopnea.Abass notes increasing shortness of breath with exercising and seems to be worsening over time with weight gain. He notes getting out of breath sooner with activity than he used to. This has not gotten worse recently. Weslyn denies shortness of breath at rest or orthopnea.   Depression screen  Primary hypertension Assessment & Plan: Blood pressure is well-controlled.  He is on spironolactone, carvedilol and Entresto.  Most recent renal parameters showed a decrease in GFR likely secondary to medications.  Patient counseled on avoidance of nephrotoxins and maintaining adequate hydration.   CAD in native  artery Assessment & Plan: I reviewed heart catheterization he has nonobstructive CAD.  He is on aspirin and on atorvastatin 40 mg a day.  He may benefit from high intensity statin therapy.  We will assess cardiovascular risk further at the next office visit and make more recommendations.   Type 2 diabetes mellitus with stage 3a chronic kidney disease, with long-term current use of insulin (HCC) Assessment & Plan: HgbA1c is not at goal for age and comorbid conditions. Denies symptoms of hypoglycemia or hyperglycemia. On Lantus and Ozempic with good adherence and no side effects.   Counseled on goals of care, monitoring for complications and importance of staying updated on immunizations and diabetes preventive measures. Continue with reduced calorie meal plan low  on processed crabs and simple sugars. Ongoing weight loss will improve insulin resistance and glycemic control  Lab Results  Component Value Date   HGBA1C 10.5 (H) 01/22/2023   HGBA1C 7.7 (H) 06/01/2022   HGBA1C 7.0 (H) 11/19/2019   Lab Results  Component Value Date   LDLCALC 91 01/23/2023   CREATININE 1.76 (H) 04/14/2023   Patient benefits from a reduced calorie nutrition plan that is low-carb but also modest and protein because of kidney disease.  He was counseled extensively on the carb insulin model of obesity today and him and his wife will be working on reducing simple and added sugars in his diet.  We checking hemoglobin A1c today.  He will follow-up with the primary care team to stay up-to-date on diabetes prevention.  His blood pressure is well-controlled.  His LDL was 91 should be closer to 70 so he may benefit from high intensity statins.   Orders: -     Vitamin B12 -     Hemoglobin A1c  OSA on CPAP Assessment & Plan: On CPAP with reported good compliance. Continue PAP therapy. Losing 15% or more of body weight may improve AHI.      Class 3 severe obesity with serious comorbidity and body mass index (BMI) of 40.0  to 44.9 in adult, unspecified obesity type (HCC) Assessment & Plan: See obesity treatment plan  Orders: -     VITAMIN D 25 Hydroxy (Vit-D Deficiency, Fractures)  HFrEF (heart failure with reduced ejection fraction) (HCC) Assessment & Plan: I reviewed most recent echocardiogram which showed an ejection fraction of 25 to 30% relatively unchanged from baseline.  He is current on guideline based medical therapy.  He is not aware that he has chronic kidney disease this medications may also be causing recurrent AKI's due to fluctuations in volume status.  He needs close monitoring of his kidney function as well as electrolytes.  He will follow-up with primary care team.  We discussed the importance of monitoring weight as well as maintaining a low sodium diet to less than 2000 mg a day.   Abnormal metabolism Assessment & Plan: Patient has a slower than predicted metabolism. IC 1901 vs. calculated 2223. This may contribute to weight gain, chronic fatigue and difficulty losing weight.   We reviewed measures to improve metabolism including not skipping meals, progressive strengthening exercises, increasing protein intake at every meal and maintaining adequate hydration and sleep.          General Health Maintenance We discussed balanced nutrition, physical activity, and regular monitoring of chronic conditions, emphasizing regular blood work for kidney function, diabetes control, and overall health. Blood work will be ordered, and a follow-up scheduled to review lab results and discuss further management, including cardiovascular and cancer risk in future visits.  Follow-up A follow-up will be scheduled to review lab results and discuss physical activity goals and further management in the next visit.             Follow-up  He was informed of the importance of frequent follow-up visits to maximize his success with intensive lifestyle modifications for his multiple health conditions. He was  informed we would discuss his lab results at his next visit unless there is a critical issue that needs to be addressed sooner. Kasai agreed to keep his next visit at the agreed upon time to discuss these results.  Attestation Statement  This is the patient's intake visit at [facility]. The patient's Health Questionnaire was reviewed at  length. Included in the packet: current and past health history, medications, allergies, ROS, gynecologic history (women only), surgical history, family history, social history, weight history, weight loss surgery history (for those that have had weight loss surgery), nutritional evaluation, mood and food questionnaire, PHQ9, Epworth questionnaire, sleep habits questionnaire, patient life and health improvement goals questionnaire. These will all be scanned into the patient's chart under media.   During the visit, I independently reviewed  bioimpedance scale results, and indirect calorimetry results. I used this information to medically tailor a meal plan for the patient that will help him to lose weight and will improve his obesity-related conditions. I performed a medically necessary appropriate examination and/or evaluation. I discussed the assessment and treatment plan with the patient. The patient was provided an opportunity to ask questions and all were answered. The patient agreed with the plan and demonstrated an understanding of the instructions. Labs were ordered at this visit and will be reviewed at the next visit unless critical results need to be addressed immediately. Clinical information was updated and documented in the EMR.   Time spent on visit including pre-visit chart review and post-visit care was 70  minutes.    Worthy Rancher, MD

## 2023-05-22 NOTE — Assessment & Plan Note (Signed)
I reviewed most recent echocardiogram which showed an ejection fraction of 25 to 30% relatively unchanged from baseline.  He is current on guideline based medical therapy.  He is not aware that he has chronic kidney disease this medications may also be causing recurrent AKI's due to fluctuations in volume status.  He needs close monitoring of his kidney function as well as electrolytes.  He will follow-up with primary care team.  We discussed the importance of monitoring weight as well as maintaining a low sodium diet to less than 2000 mg a day.

## 2023-05-22 NOTE — Assessment & Plan Note (Signed)
 See obesity treatment plan

## 2023-05-22 NOTE — Assessment & Plan Note (Signed)
HgbA1c is not at goal for age and comorbid conditions. Denies symptoms of hypoglycemia or hyperglycemia. On Lantus and Ozempic with good adherence and no side effects.   Counseled on goals of care, monitoring for complications and importance of staying updated on immunizations and diabetes preventive measures. Continue with reduced calorie meal plan low on processed crabs and simple sugars. Ongoing weight loss will improve insulin resistance and glycemic control  Lab Results  Component Value Date   HGBA1C 10.5 (H) 01/22/2023   HGBA1C 7.7 (H) 06/01/2022   HGBA1C 7.0 (H) 11/19/2019   Lab Results  Component Value Date   LDLCALC 91 01/23/2023   CREATININE 1.76 (H) 04/14/2023   Patient benefits from a reduced calorie nutrition plan that is low-carb but also modest and protein because of kidney disease.  He was counseled extensively on the carb insulin model of obesity today and him and his wife will be working on reducing simple and added sugars in his diet.  We checking hemoglobin A1c today.  He will follow-up with the primary care team to stay up-to-date on diabetes prevention.  His blood pressure is well-controlled.  His LDL was 91 should be closer to 70 so he may benefit from high intensity statins.

## 2023-05-23 LAB — HEMOGLOBIN A1C
Est. average glucose Bld gHb Est-mCnc: 160 mg/dL
Hgb A1c MFr Bld: 7.2 % — ABNORMAL HIGH (ref 4.8–5.6)

## 2023-05-23 LAB — VITAMIN D 25 HYDROXY (VIT D DEFICIENCY, FRACTURES): Vit D, 25-Hydroxy: 49.3 ng/mL (ref 30.0–100.0)

## 2023-05-23 LAB — VITAMIN B12: Vitamin B-12: 908 pg/mL (ref 232–1245)

## 2023-06-02 NOTE — Progress Notes (Unsigned)
ADVANCED HF CLINIC NOTE  Referring Physician: Debbe Odea, MD  Primary Care: [facility] Primary Cardiologist: Debbe Odea, MD  HPI:  Erik Black is a 72 y.o. male with morbid obesity, HTN, OSA on CPAP, DM2 HFrEF EF 30 to 35%, diabetes, former smoker  Diagnosed with HF in 9/23. Echo 9/23 EF 30-35%. Started lasix. Referred to [facility]   Admitted 11/23 with ADHF cath as below. Treated with IV lasix and GDMT titrated. Diuresed 22 pounds.   Cath: 05/03/22  LAD 60%d LCx 55% prox RCA 30%m  RA 18 RV 68/15 PA 69/39 (41) PCWP 40  Fick 5.6/2.3   cMRI: 11/23 EF 25% mid wall LGE strip c/w NICM. RV moderately down.   Zio 11/23: Occasional NSVT 13.7% PVCs (one predominant morphology 13.6%)  Echo 3/24: EF 20-25% RV ok  Echo 7/24; EF 25-30% RV ok   Zio 6/24 Sinus. 2.1% PVCs (suppressed on mexilitene)  Underwent MDT ICD placement 01/28/23.   Admitted in 10/24 with mechanical fall. Saw Dr. Lalla Brothers in 10/24 - felt he was stable  Here for f/u with his wife.  FHx - Mother with HF - 65 Brothers with HF - 1 with ICD - Father with MI     Past Medical History:  Diagnosis Date   Acute on chronic combined systolic and diastolic CHF (congestive heart failure) (HCC) 05/03/2022   Acute on chronic systolic CHF (congestive heart failure) (HCC) 06/01/2022   Anxiety    Arthritis    Chest pain 01/22/2023   CHF (congestive heart failure) (HCC)    Depression    Diabetes mellitus without complication (HCC)    GERD (gastroesophageal reflux disease)    Glaucoma    both eyes   Hypertension    Hypokalemia 05/03/2022   Positive D dimer 06/01/2022   Sleep apnea    uses Cpap nightly    Current Outpatient Medications  Medication Sig Dispense Refill   acetaminophen (TYLENOL) 500 MG tablet Take 1,000 mg by mouth as needed for moderate pain. (Patient not taking: Reported on 05/22/2023)     amLODipine (NORVASC) 10 MG tablet Take 10 mg by mouth daily.     aspirin EC 81 MG  tablet Take 1 tablet (81 mg total) by mouth 2 (two) times daily for 6 weeks, then as directed by provider. 120 tablet 0   atorvastatin (LIPITOR) 40 MG tablet Take 1 tablet (40 mg total) by mouth at bedtime. 90 tablet 3   BREZTRI AEROSPHERE 160-9-4.8 MCG/ACT AERO Inhale 1 puff into the lungs 2 (two) times daily.     Brinzolamide-Brimonidine 1-0.2 % SUSP Place 1 drop into both eyes 3 (three) times daily.     carvedilol (COREG) 25 MG tablet TAKE 1 TABLET BY MOUTH TWICE A DAY 180 tablet 1   celecoxib (CELEBREX) 200 MG capsule Take 200 mg by mouth daily.     Cholecalciferol (VITAMIN D) 50 MCG (2000 UT) tablet Take 4,000 Units by mouth daily.      docusate sodium (COLACE) 100 MG capsule Take 100 mg by mouth daily as needed for mild constipation or moderate constipation. (Patient not taking: Reported on 05/22/2023)     DULoxetine (CYMBALTA) 60 MG capsule Take 60 mg by mouth daily.     ENTRESTO 97-103 MG TAKE 1 TABLET BY MOUTH TWICE A DAY 60 tablet 6   insulin glargine (LANTUS) 100 UNIT/ML Solostar Pen Inject 15 Units into the skin at bedtime. 15 mL 1   Insulin Pen Needle 32G X 4 MM MISC Use  at bedtime. 100 each 1   latanoprost (XALATAN) 0.005 % ophthalmic solution Place 1 drop into both eyes at bedtime.     mexiletine (MEXITIL) 200 MG capsule TAKE 1 CAPSULE BY MOUTH TWICE A DAY 180 capsule 1   Multiple Vitamins-Minerals (MULTIVITAMIN WITH MINERALS) tablet Take 1 tablet by mouth daily.     Omega-3 1000 MG CAPS Take 1,000 mg by mouth 3 (three) times daily after meals.     omeprazole (PRILOSEC) 20 MG capsule Take 20 mg by mouth 2 (two) times daily.     OXcarbazepine (TRILEPTAL) 150 MG tablet Take 150 mg by mouth 2 (two) times daily.     potassium chloride SA (KLOR-CON M) 20 MEQ tablet Take 20 mEq by mouth daily.     Semaglutide, 2 MG/DOSE, (OZEMPIC, 2 MG/DOSE,) 8 MG/3ML SOPN Inject 2 mg into the skin once a week. 3 mL 11   spironolactone (ALDACTONE) 25 MG tablet TAKE 1 TABLET (25 MG TOTAL) BY MOUTH DAILY.  90 tablet 2   timolol (TIMOPTIC-XR) 0.5 % ophthalmic gel-forming Place 1 drop into both eyes daily.     torsemide (DEMADEX) 20 MG tablet TAKE 2 TABLETS (40 MG TOTAL) BY MOUTH DAILY. (Patient taking differently: Take 20 mg by mouth 2 (two) times daily.) 180 tablet 1   No current facility-administered medications for this visit.    Allergies  Allergen Reactions   Empagliflozin Rash and Other (See Comments)    Hypersensitivity reaction   Methadone Itching   Oxycodone Itching    Hands started peeling and "could not swallow"   Lisinopril Cough      Social History   Socioeconomic History   Marital status: Married    Spouse name: [name]   Number of children: 0   Years of education: Not on file   Highest education level: Not on file  Occupational History   Occupation: Retired  Tobacco Use   Smoking status: Former    Current packs/day: 0.00    Types: Cigarettes    Quit date: 05/26/2019    Years since quitting: 4.0   Smokeless tobacco: Never   Tobacco comments:    smokes a pack a month  Vaping Use   Vaping status: Never Used  Substance and Sexual Activity   Alcohol use: Not Currently   Drug use: Not Currently   Sexual activity: Not on file  Other Topics Concern   Not on file  Social History Narrative   Lives at home with wife [name] and a dog.   Social Determinants of Health   Financial Resource Strain: Not on file  Food Insecurity: No Food Insecurity (04/14/2023)   Hunger Vital Sign    Worried About Running Out of Food in the Last Year: Never true    Ran Out of Food in the Last Year: Never true  Transportation Needs: No Transportation Needs (04/14/2023)   PRAPARE - Administrator, Civil Service (Medical): No    Lack of Transportation (Non-Medical): No  Physical Activity: Not on file  Stress: Not on file  Social Connections: Unknown (11/14/2021)   [facility]   Social Network    Social Network: Not on file  Intimate Partner  Violence: Not At Risk (04/14/2023)   Humiliation, Afraid, Rape, and Kick questionnaire    Fear of Current or Ex-Partner: No    Emotionally Abused: No    Physically Abused: No    Sexually Abused: No      Family History  Problem  Relation Age of Onset   Hypertension Mother    Diabetes Mother    Heart disease Mother    Stroke Mother    Heart disease Father    High blood pressure Father    Alcoholism Father    Drug abuse Father    Heart disease Brother     There were no vitals filed for this visit.  Wt Readings from Last 3 Encounters:  05/22/23 271 lb (122.9 kg)  05/01/23 274 lb (124.3 kg)  04/14/23 272 lb 4.3 oz (123.5 kg)     PHYSICAL EXAM: General:  Well appearing. No resp difficulty HEENT: normal Neck: supple. no JVD. Carotids 2+ bilat; no bruits. No lymphadenopathy or thryomegaly appreciated. Cor: PMI nondisplaced. Regular rate & rhythm. No rubs, gallops or murmurs. ICD site ok Lungs: clear Abdomen: obese soft, nontender, nondistended. No hepatosplenomegaly. No bruits or masses. Good bowel sounds. Extremities: no cyanosis, clubbing, rash, edema Neuro: alert & orientedx3, cranial nerves grossly intact. moves all 4 extremities w/o difficulty. Affect pleasant  ASSESSMENT & PLAN:   1. Chronic systolic HF - Echocardiogram 03/2022 EF 30 to 35% - Cath 11/23 non-obstructive CAD - Admit 11/23 with marked volume overload.  - cMRI: 11/23 EF 25% mid wall LGE strip c/w NICM. RV moderately down.  - Zio 11/23: Occasional NSVT 13.7% PVCs (one predominant morphology 13.6%) - Echo 3/24: EF 20-25% RV ok  - Echo 7/24; EF 25-30% RV ok  - Underwent MDT ICD placement 01/28/23 - Suspect PVC CM vs familial (LMNA)  No evidence of infiltrative process on MRI  - EF has not improved with PVC suppression with mexilitene. He has met with Dr. Jomarie Longs in [place] who agrees that Fhx is concerning for potential genetic CM however he has no first-degree relatives that would benefit from screening so  has decided not to pursue - Stable NYHA II-III - Volume status looks good on torsemide 40 daily - Continue Entresto to 97/103 bid - Continue carvedilol 25 bid - Continue spiro 25 daily - Patient did not tolerate Jardiance in the past, developed yeast infection - Can consider CCM or Barostim (versus VAD) down the road if symptoms progress  2. Frequent PVCs - Zio 11/23: Occasional NSVT 13.7% PVCs (one predominant morphology 13.6%) - Continue mexilitene 200 bid for suppression  - Zio 6/24 Sinus. 2.1% PVCs (suppressed on mexilitene)  3. HTN - Blood pressure well controlled. Continue current regimen.  4. OSA - compliant with CPAP  5. Morbid obesity  - continue GLP-1 RA - There is no height or weight on file to calculate BMI.  Arvilla Meres, MD  9:53 PM

## 2023-06-03 ENCOUNTER — Other Ambulatory Visit: Payer: Self-pay

## 2023-06-03 ENCOUNTER — Encounter: Payer: Medicare HMO | Admitting: Internal Medicine

## 2023-06-04 ENCOUNTER — Other Ambulatory Visit: Payer: Self-pay

## 2023-06-05 ENCOUNTER — Encounter (INDEPENDENT_AMBULATORY_CARE_PROVIDER_SITE_OTHER): Payer: Self-pay | Admitting: Internal Medicine

## 2023-06-05 ENCOUNTER — Ambulatory Visit (INDEPENDENT_AMBULATORY_CARE_PROVIDER_SITE_OTHER): Payer: Medicare HMO | Admitting: Internal Medicine

## 2023-06-05 VITALS — BP 129/82 | HR 84 | Temp 98.2°F | Ht 69.0 in | Wt 271.0 lb

## 2023-06-05 DIAGNOSIS — N1831 Chronic kidney disease, stage 3a: Secondary | ICD-10-CM | POA: Diagnosis not present

## 2023-06-05 DIAGNOSIS — G4733 Obstructive sleep apnea (adult) (pediatric): Secondary | ICD-10-CM

## 2023-06-05 DIAGNOSIS — I10 Essential (primary) hypertension: Secondary | ICD-10-CM

## 2023-06-05 DIAGNOSIS — I5020 Unspecified systolic (congestive) heart failure: Secondary | ICD-10-CM

## 2023-06-05 DIAGNOSIS — I130 Hypertensive heart and chronic kidney disease with heart failure and stage 1 through stage 4 chronic kidney disease, or unspecified chronic kidney disease: Secondary | ICD-10-CM

## 2023-06-05 DIAGNOSIS — E1142 Type 2 diabetes mellitus with diabetic polyneuropathy: Secondary | ICD-10-CM

## 2023-06-05 DIAGNOSIS — Z7985 Long-term (current) use of injectable non-insulin antidiabetic drugs: Secondary | ICD-10-CM

## 2023-06-05 DIAGNOSIS — Z6841 Body Mass Index (BMI) 40.0 and over, adult: Secondary | ICD-10-CM

## 2023-06-05 DIAGNOSIS — E66813 Obesity, class 3: Secondary | ICD-10-CM

## 2023-06-05 NOTE — Progress Notes (Signed)
Office: [phone]  /  Fax: [phone]  Weight Summary And Biometrics  Vitals Temp: 98.2 F (36.8 C) BP: 129/82 Pulse Rate: 84 SpO2: 98 %   Anthropometric Measurements Height: 5\' 9"  (1.753 m) Weight: 271 lb (122.9 kg) BMI (Calculated): 40 Weight at Last Visit: 271 lb Weight Lost Since Last Visit: 0 lb Weight Gained Since Last Visit: 0 lb Starting Weight: 271 lb Total Weight Loss (lbs): 0 lb (0 kg) Peak Weight: 325 lb   Body Composition  Body Fat %: 41 % Fat Mass (lbs): 111.2 lbs Muscle Mass (lbs): 152.4 lbs Total Body Water (lbs): 113.2 lbs Visceral Fat Rating : 28    RMR: [id number]  Today's Visit #: 2  Starting Date: 05/22/23   Subjective   Chief Complaint: Obesity  Erik Black is here to discuss his progress with his obesity treatment plan. He is on the the Category 3 Plan and states he is following his eating plan approximately 50 % of the time. He states he is not exercising.  Interval History:   Discussed the use of AI scribe software for clinical note transcription with the patient, who gave verbal consent to proceed.  History of Present Illness   The patient, a 72 year old individual with a history of obesity, heart failure with reduced ejection fraction, hypertension, coronary artery disease, obstructive sleep apnea managed with CPAP, type 2 diabetes, and stage 3A chronic kidney disease, presents for a follow-up consultation for weight management. The patient reports difficulty with exercise due to arthritic pain in the shoulders and joint deterioration.  Nutritionally, the patient reports a decreased appetite, attributed to the medication Ozempic. This has led to instances of hypoglycemia, with two episodes in the past week, necessitating forced eating to raise glucose levels. The patient has a history of skipping either lunch or dinner due to lack of hunger, a pattern that predates the initiation of Ozempic.  The patient's glucose levels and blood  pressure are monitored daily at home, with readings transmitted electronically to the [place]. Recently, the device stopped transmitting glucose levels, so the patient has been manually recording these.  The patient's most recent A1c was 7.2, a significant improvement from a previous reading of 10.1. The patient is currently on insulin therapy, which was initiated after the placement of a defibrillator.  The patient also reports a recent decrease in physical activity, which he attributes to various health issues including knee and hip problems, and heart issues. This decrease in activity has been more pronounced during the colder months, extending from a typical rest cycle of two to three months to nearly eight months.  The patient has noticed a loss of muscle mass, particularly in the legs and arms, which he attributes to decreased physical activity and possibly inadequate protein intake due to restrictive eating. The patient has also experienced falls, which may be related to the loss of muscle mass and deconditioning.  The patient's blood pressure at the time of the consultation was 138/77, which was later rechecked and found to be 129/82. The patient's blood pressure is monitored daily at home, and he has been advised to aim for readings closer to 130/80 or less due to his history of stroke and kidney disease.       Orexigenic Control:  Denies problems with appetite and hunger signals.  Denies problems with satiety and satiation.  Denies problems with eating patterns and portion control.  Denies abnormal cravings. Denies feeling deprived or restricted.   Barriers identified: none.   Pharmacotherapy for  weight loss: He is currently taking Ozempic with diabetes as the primary indication with adequate clinical response  and experiencing the following side effects: Restrictive eating with loss of muscle..   Assessment and Plan   Treatment Plan For Obesity:  Recommended Dietary  Goals  Erik Black is currently in the action stage of change. As such, his goal is to continue weight management plan. He has agreed to: continue current plan we again reviewed images of a balanced plate.  He is having difficulty understanding some of the nutritional concepts.  He has been seen by a dietitian in the past at the [place].  We spent a significant amount of time also counseling on differences between simple and processed carbs.  Behavioral Intervention  We discussed the following Behavioral Modification Strategies today: continue to work on maintaining a reduced calorie state, getting the recommended amount of protein, incorporating whole foods, making healthy choices, staying well hydrated and practicing mindfulness when eating..  Additional resources provided today: Handout on traveling and holiday eating strategies  Recommended Physical Activity Goals  Erik Black has been advised to work up to 150 minutes of moderate intensity aerobic activity a week and strengthening exercises 2-3 times per week for cardiovascular health, weight loss maintenance and preservation of muscle mass.   He has agreed to :  Think about enjoyable ways to increase daily physical activity and overcoming barriers to exercise and Increase physical activity in their day and reduce sedentary time (increase NEAT).  Pharmacotherapy  We discussed various medication options to help Erik Black with his weight loss efforts and we both agreed to :  Patient is on Ozempic for type 2 diabetes and has had a significant response from a glycemic standpoint I think this is compounding though his restrictive eating pattern and I suspect he has lost muscle mass as a result.  We may have to lower medication to increase his appetite and may benefit from an SGLT2 drug which may assist with weight loss and also nephro prophylaxis as he has stage III chronic kidney disease  Associated Conditions Addressed Today  HFrEF (heart failure  with reduced ejection fraction) (HCC)  Primary hypertension  CAD in native artery  OSA on CPAP  Essential hypertension  Type 2 diabetes mellitus with peripheral neuropathy (HCC)  Stage 3a chronic kidney disease (HCC)  Class 3 severe obesity with serious comorbidity and body mass index (BMI) of 40.0 to 44.9 in adult, unspecified obesity type (HCC)    Assessment and Plan    Obesity Erik Black is managing obesity, which affects his overall health and ability to exercise. Ozempic has reduced his appetite, leading to skipped meals and potential nutritional deficiencies. We emphasized the importance of consuming three balanced meals per day, focusing on high protein and low carbohydrate intake to improve metabolism and reduce insulin resistance. We will encourage increased physical activity and monitor for symptoms of hypoglycemia, adjusting insulin dosage as necessary.  Type 2 Diabetes Mellitus Jarett's diabetes management includes Ozempic and insulin, with his A1c improving from 10.1 to 7.2. The restrictive eating caused by Ozempic is leading to hypoglycemia and muscle mass loss. We discussed monitoring blood glucose levels and adjusting insulin dosage as needed, along with the importance of balanced meals to prevent hypoglycemia and improve muscle mass. Ozempic will be continued.  Heart Failure with Reduced Ejection Fraction Erik Black's heart failure is well-managed with current medications, though fatigue may be a side effect of carvedilol. His blood pressure is well-controlled. We discussed monitoring for signs of dehydration due  to diuretics and maintaining adequate fluid intake. Current medications, including carvedilol 25 mg, amlodipine 10 mg, Entresto, spironolactone, and torsemide, will be continued.  Hypertension Erik Black's blood pressure is well-controlled with medication, with recent readings of 138/77 and 129/82. We emphasized the importance of daily blood pressure  monitoring to ensure continued control. Current antihypertensive medications will be continued.  Chronic Kidney Disease Stage 3A Erik Black's CKD is managed with current medications, with blood pressure control crucial to prevent further kidney damage. We discussed the importance of regular monitoring of kidney function. Current medications will be continued.  Patient also counseled on adequate hydration and avoiding nephrotoxins.  Obstructive Sleep Apnea Erik Black is compliant with CPAP therapy, and weight loss may improve his apnea-hypopnea index (AHI). We discussed the potential benefits of losing 15% of body weight on sleep apnea and encouraged weight loss to improve sleep apnea.  General Health Maintenance We discussed the importance of balanced nutrition, physical activity, and hydration, providing tips for maintaining health during the holidays. Erik Black is advised to follow a balanced diet with high protein and low carbohydrates, increase physical activity, stay hydrated, and follow holiday health tips provided.  Follow-up A 40-minute follow-up visit is scheduled in four weeks.        Objective   Physical Exam:  Blood pressure 129/82, pulse 84, temperature 98.2 F (36.8 C), height 5\' 9"  (1.753 m), weight 271 lb (122.9 kg), SpO2 98%. Body mass index is 40.02 kg/m.  General: He is overweight, cooperative, alert, well developed, and in no acute distress. PSYCH: Has normal mood, affect and thought process.   HEENT: EOMI, sclerae are anicteric. Lungs: Normal breathing effort, no conversational dyspnea. Extremities: No edema.  Neurologic: No gross sensory or motor deficits. No tremors or fasciculations noted.    Diagnostic Data Reviewed:  BMET    Component Value Date/Time   NA 136 04/14/2023 1421   K 3.5 04/14/2023 1421   CL 104 04/14/2023 1421   CO2 25 04/14/2023 1421   GLUCOSE 115 (H) 04/14/2023 1421   BUN 19 04/14/2023 1421   CREATININE 1.76 (H) 04/14/2023 1421    CALCIUM 9.0 04/14/2023 1421   GFRNONAA 41 (L) 04/14/2023 1421   GFRAA 59 (L) 11/24/2019 0628   Lab Results  Component Value Date   HGBA1C 7.2 (H) 05/22/2023   HGBA1C 6.1 (H) 04/16/2019   No results found for: "INSULIN" Lab Results  Component Value Date   TSH 0.774 12/05/2022   CBC    Component Value Date/Time   WBC 6.6 04/14/2023 1421   RBC 4.64 04/14/2023 1421   HGB 13.5 04/14/2023 1421   HCT 40.6 04/14/2023 1421   PLT 137 (L) 04/14/2023 1421   MCV 87.5 04/14/2023 1421   MCH 29.1 04/14/2023 1421   MCHC 33.3 04/14/2023 1421   RDW 13.0 04/14/2023 1421   Iron Studies No results found for: "IRON", "TIBC", "FERRITIN", "IRONPCTSAT" Lipid Panel     Component Value Date/Time   CHOL 152 01/23/2023 0526   TRIG 134 01/23/2023 0526   HDL 34 (L) 01/23/2023 0526   CHOLHDL 4.5 01/23/2023 0526   VLDL 27 01/23/2023 0526   LDLCALC 91 01/23/2023 0526   Hepatic Function Panel     Component Value Date/Time   PROT 7.2 01/22/2023 0935   ALBUMIN 4.1 01/22/2023 0935   AST 20 01/22/2023 0935   ALT 15 01/22/2023 0935   ALKPHOS 120 01/22/2023 0935   BILITOT 0.8 01/22/2023 0935      Component Value Date/Time   TSH 0.774 12/05/2022  1147   Nutritional Lab Results  Component Value Date   VD25OH 49.3 05/22/2023    Follow-Up   Return in about 4 weeks (around 07/03/2023) for For Weight Mangement with Dr. Rikki Spearing - 40 minutes.Marland Kitchen He was informed of the importance of frequent follow up visits to maximize his success with intensive lifestyle modifications for his multiple health conditions.  Attestation Statement   Reviewed by clinician on day of visit: allergies, medications, problem list, medical history, surgical history, family history, social history, and previous encounter notes.   I have spent 40 minutes in the care of the patient today including: preparing to see patient (e.g. review and interpretation of tests, old notes ), obtaining and/or reviewing separately obtained history,  performing a medically appropriate examination or evaluation, counseling and educating the patient, documenting clinical information in the electronic or other health care record, and independently interpreting results and communicating results to the patient, family, or caregiver   Worthy Rancher, MD

## 2023-06-06 ENCOUNTER — Ambulatory Visit: Payer: Medicare HMO | Attending: Internal Medicine | Admitting: Internal Medicine

## 2023-06-06 VITALS — BP 128/78 | HR 83 | Wt 278.5 lb

## 2023-06-06 DIAGNOSIS — I5022 Chronic systolic (congestive) heart failure: Secondary | ICD-10-CM | POA: Diagnosis not present

## 2023-06-06 DIAGNOSIS — I493 Ventricular premature depolarization: Secondary | ICD-10-CM | POA: Diagnosis not present

## 2023-06-06 DIAGNOSIS — I10 Essential (primary) hypertension: Secondary | ICD-10-CM | POA: Diagnosis not present

## 2023-06-06 NOTE — Patient Instructions (Signed)
Medication Changes:  Take extra dose of Torsemide today  Lab Work:  Labs done today, your results will be available in MyChart, we will contact you for abnormal readings.   Testing/Procedures:  none  Referrals:  You have been referred to Dr [facility] Barostim device [facility], his office will call you to schedule  Special Instructions // Education:  Do the following things EVERYDAY: Weigh yourself in the morning before breakfast. Write it down and keep it in a log. Take your medicines as prescribed Eat low salt foods--Limit salt (sodium) to 2000 mg per day.  Stay as active as you can everyday Limit all fluids for the day to less than 2 liters   Follow-Up in: 3 months (March 2025), **We will call you closer to this time to schedule    If you have any questions or concerns before your next appointment please send [place] a message through [employer] or call our office at [phone] Monday-Friday 8 am-5 pm.   If you have an urgent need after hours on the weekend please call your Primary Cardiologist or the [facility] in [place] at [phone].   At the [facility], you and your health needs are our priority. We have a designated team specialized in the treatment of Heart Failure. This Care Team includes your primary Heart Failure Specialized Cardiologist (physician), Advanced Practice Providers (APPs- Physician Assistants and Nurse Practitioners), and Pharmacist who all work together to provide you with the care you need, when you need it.   You may see any of the following providers on your designated Care Team at your next follow up:  Dr. Arvilla Meres Dr. Marca Ancona Dr. Dorthula Nettles Dr. Theresia Bough Tonye Becket, NP Robbie Lis, [place] [address] [place], [place] Brynda Peon, NP [place] Lee, NP Clarisa Kindred, NP Enos Fling, PharmD

## 2023-06-06 NOTE — Progress Notes (Signed)
ADVANCED HF CLINIC NOTE  Referring Physician: Debbe Odea, MD  Primary Care: [facility] Primary Cardiologist: Debbe Odea, MD  HPI:  Erik Black is a 72 y.o. male with morbid obesity, HTN, OSA on CPAP, DM2 HFrEF EF 30 to 35%, diabetes, former smoker  Diagnosed with HF in 9/23. Echo 9/23 EF 30-35%. Started lasix. Referred to [facility]   Admitted 11/23 with ADHF cath as below. Treated with IV lasix and GDMT titrated. Diuresed 22 pounds.   Cath: 05/03/22  LAD 60%d LCx 55% prox RCA 30%m  RA 18 RV 68/15 PA 69/39 (41) PCWP 40  Fick 5.6/2.3  cMRI: 11/23 EF 25% mid wall LGE strip c/w NICM. RV moderately down.   Zio 11/23: Occasional NSVT 13.7% PVCs (one predominant morphology 13.6%)  Echo 3/24: EF 20-25% RV ok  Echo 7/24; EF 25-30% RV ok   Zio 6/24 Sinus. 2.1% PVCs (suppressed on mexilitene)  Underwent MDT ICD placement 01/28/23.   Admitted in 10/24 with mechanical fall. Saw Dr. Lalla Brothers in 10/24 - felt he was stable  Here for f/u with his wife. Fell through a sewer grate at the car wash last month. Still sore a bit. Otherwise feels good. Denies CP or SOB. Still fatigued. Says weight is coming down but he is up 7 pounds on our scale . No edema, orthopnea or PND.   FHx - Mother with HF - 9 Brothers with HF - 1 with ICD - Father with MI     Past Medical History:  Diagnosis Date   Acute on chronic combined systolic and diastolic CHF (congestive heart failure) (HCC) 05/03/2022   Acute on chronic systolic CHF (congestive heart failure) (HCC) 06/01/2022   Anxiety    Arthritis    Chest pain 01/22/2023   CHF (congestive heart failure) (HCC)    Depression    Diabetes mellitus without complication (HCC)    GERD (gastroesophageal reflux disease)    Glaucoma    both eyes   Hypertension    Hypokalemia 05/03/2022   Positive D dimer 06/01/2022   Sleep apnea    uses Cpap nightly    Current Outpatient Medications  Medication Sig Dispense Refill    acetaminophen (TYLENOL) 500 MG tablet Take 1,000 mg by mouth as needed for moderate pain (pain score 4-6).     amLODipine (NORVASC) 10 MG tablet Take 10 mg by mouth daily.     aspirin EC 81 MG tablet Take 1 tablet (81 mg total) by mouth 2 (two) times daily for 6 weeks, then as directed by provider. 120 tablet 0   atorvastatin (LIPITOR) 40 MG tablet Take 1 tablet (40 mg total) by mouth at bedtime. 90 tablet 3   BREZTRI AEROSPHERE 160-9-4.8 MCG/ACT AERO Inhale 1 puff into the lungs 2 (two) times daily.     Brinzolamide-Brimonidine 1-0.2 % SUSP Place 1 drop into both eyes 3 (three) times daily.     carvedilol (COREG) 25 MG tablet TAKE 1 TABLET BY MOUTH TWICE A DAY 180 tablet 1   celecoxib (CELEBREX) 200 MG capsule Take 200 mg by mouth daily.     Cholecalciferol (VITAMIN D) 50 MCG (2000 UT) tablet Take 4,000 Units by mouth daily.      docusate sodium (COLACE) 100 MG capsule Take 100 mg by mouth daily as needed for mild constipation or moderate constipation.     DULoxetine (CYMBALTA) 60 MG capsule Take 60 mg by mouth daily.     ENTRESTO 97-103 MG TAKE 1 TABLET BY MOUTH TWICE A  DAY 60 tablet 6   insulin glargine (LANTUS) 100 UNIT/ML Solostar Pen Inject 15 Units into the skin at bedtime. 15 mL 1   Insulin Pen Needle 32G X 4 MM MISC Use at bedtime. 100 each 1   latanoprost (XALATAN) 0.005 % ophthalmic solution Place 1 drop into both eyes at bedtime.     mexiletine (MEXITIL) 200 MG capsule TAKE 1 CAPSULE BY MOUTH TWICE A DAY 180 capsule 1   Multiple Vitamins-Minerals (MULTIVITAMIN WITH MINERALS) tablet Take 1 tablet by mouth daily.     Omega-3 1000 MG CAPS Take 1,000 mg by mouth 3 (three) times daily after meals.     omeprazole (PRILOSEC) 20 MG capsule Take 20 mg by mouth 2 (two) times daily.     OXcarbazepine (TRILEPTAL) 150 MG tablet Take 150 mg by mouth 2 (two) times daily.     potassium chloride SA (KLOR-CON M) 20 MEQ tablet Take 20 mEq by mouth daily.     Semaglutide, 2 MG/DOSE, (OZEMPIC, 2  MG/DOSE,) 8 MG/3ML SOPN Inject 2 mg into the skin once a week. 3 mL 11   spironolactone (ALDACTONE) 25 MG tablet TAKE 1 TABLET (25 MG TOTAL) BY MOUTH DAILY. 90 tablet 2   timolol (TIMOPTIC-XR) 0.5 % ophthalmic gel-forming Place 1 drop into both eyes daily.     torsemide (DEMADEX) 20 MG tablet TAKE 2 TABLETS (40 MG TOTAL) BY MOUTH DAILY. 180 tablet 1   No current facility-administered medications for this visit.    Allergies  Allergen Reactions   Empagliflozin Rash and Other (See Comments)    Hypersensitivity reaction   Methadone Itching   Oxycodone Itching    Hands started peeling and "could not swallow"   Lisinopril Cough      Social History   Socioeconomic History   Marital status: Married    Spouse name: [name]   Number of children: 0   Years of education: Not on file   Highest education level: Not on file  Occupational History   Occupation: Retired  Tobacco Use   Smoking status: Former    Current packs/day: 0.00    Types: Cigarettes    Quit date: 05/26/2019    Years since quitting: 4.0   Smokeless tobacco: Never   Tobacco comments:    smokes a pack a month  Vaping Use   Vaping status: Never Used  Substance and Sexual Activity   Alcohol use: Not Currently   Drug use: Not Currently   Sexual activity: Not on file  Other Topics Concern   Not on file  Social History Narrative   Lives at home with wife [name] and a dog.   Social Determinants of Health   Financial Resource Strain: Not on file  Food Insecurity: No Food Insecurity (04/14/2023)   Hunger Vital Sign    Worried About Running Out of Food in the Last Year: Never true    Ran Out of Food in the Last Year: Never true  Transportation Needs: No Transportation Needs (04/14/2023)   PRAPARE - Administrator, Civil Service (Medical): No    Lack of Transportation (Non-Medical): No  Physical Activity: Not on file  Stress: Not on file  Social Connections: Unknown (11/14/2021)   Received from [facility], [facility]   Social Network    Social Network: Not on file  Intimate Partner Violence: Not At Risk (04/14/2023)   Humiliation, Afraid, Rape, and Kick questionnaire    Fear of Current or Ex-Partner: No    Emotionally  Abused: No    Physically Abused: No    Sexually Abused: No      Family History  Problem Relation Age of Onset   Hypertension Mother    Diabetes Mother    Heart disease Mother    Stroke Mother    Heart disease Father    High blood pressure Father    Alcoholism Father    Drug abuse Father    Heart disease Brother     Vitals:   06/06/23 0947  BP: 128/78  Pulse: 83  SpO2: 100%  Weight: 278 lb 8 oz (126.3 kg)    Wt Readings from Last 3 Encounters:  06/06/23 278 lb 8 oz (126.3 kg)  06/05/23 271 lb (122.9 kg)  05/22/23 271 lb (122.9 kg)     PHYSICAL EXAM: General:  Well appearing. No resp difficulty HEENT: normal Neck: supple. no JVD. Carotids 2+ bilat; no bruits. No lymphadenopathy or thryomegaly appreciated. Cor: PMI nondisplaced. Regular rate & rhythm. No rubs, gallops or murmurs. Lungs: clear Abdomen: obese soft, nontender, nondistended. No hepatosplenomegaly. No bruits or masses. Good bowel sounds. Extremities: no cyanosis, clubbing, rash, edema Neuro: alert & orientedx3, cranial nerves grossly intact. moves all 4 extremities w/o difficulty. Affect pleasant   ICD interrogation: Fluid mildly elevated. No VT/AF. Activity level 2.5-3 hr/day Personally reviewed   ASSESSMENT & PLAN:   1. Chronic systolic HF - Echocardiogram 03/2022 EF 30 to 35% - Cath 11/23 non-obstructive CAD - Admit 11/23 with marked volume overload.  - cMRI: 11/23 EF 25% mid wall LGE strip c/w NICM. RV moderately down.  - Zio 11/23: Occasional NSVT 13.7% PVCs (one predominant morphology 13.6%) - Echo 3/24: EF 20-25% RV ok  - Echo 7/24; EF 25-30% RV ok  - Underwent MDT ICD placement 01/28/23 - Suspect PVC CM vs familial (LMNA)  No evidence of infiltrative process on MRI   - EF has not improved with PVC suppression with mexilitene. He has met with Dr. Jomarie Longs in [place] who agrees that Fhx is concerning for potential genetic CM however he has no first-degree relatives that would benefit from screening so has decided not to pursue - Stable NYHA II-early III - Volume status mildly elevated on torsemide 40 daily. Wil double for 2-3 days. Discussed use of sliding scale - Continue Entresto to 97/103 bid - Continue carvedilol 25 bid - Continue spiro 25 daily - Patient did not tolerate Jardiance in the past, developed yeast infection - We discussed Barostim therapy for possible functional improvement - he is interested. Refer to Dr. Myra Gianotti  2. Frequent PVCs - Zio 11/23: Occasional NSVT 13.7% PVCs (one predominant morphology 13.6%) - Continue mexilitene 200 bid for suppression  - Zio 6/24 Sinus. 2.1% PVCs (suppressed on mexilitene) - PVCs remain suppressed on exam  3. HTN - Blood pressure well controlled. Continue current regimen.  4. OSA - compliant with CPAP   5. Morbid obesity  - continue GLP-1 RA - Body mass index is 41.13 kg/m.  Arvilla Meres, MD  9:53 AM

## 2023-06-07 ENCOUNTER — Other Ambulatory Visit: Payer: Self-pay | Admitting: *Deleted

## 2023-06-07 DIAGNOSIS — I5020 Unspecified systolic (congestive) heart failure: Secondary | ICD-10-CM

## 2023-06-07 DIAGNOSIS — Z01818 Encounter for other preprocedural examination: Secondary | ICD-10-CM

## 2023-06-07 LAB — PRO B NATRIURETIC PEPTIDE: NT-Pro BNP: 129 pg/mL (ref 0–376)

## 2023-06-07 LAB — BASIC METABOLIC PANEL
BUN/Creatinine Ratio: 11 (ref 10–24)
BUN: 15 mg/dL (ref 8–27)
CO2: 23 mmol/L (ref 20–29)
Calcium: 10.1 mg/dL (ref 8.6–10.2)
Chloride: 104 mmol/L (ref 96–106)
Creatinine, Ser: 1.4 mg/dL — ABNORMAL HIGH (ref 0.76–1.27)
Glucose: 104 mg/dL — ABNORMAL HIGH (ref 70–99)
Potassium: 3.7 mmol/L (ref 3.5–5.2)
Sodium: 143 mmol/L (ref 134–144)
eGFR: 54 mL/min/{1.73_m2} — ABNORMAL LOW (ref >59)

## 2023-06-07 NOTE — Progress Notes (Signed)
ADVANCED HF CLINIC NOTE  Referring Physician: Debbe Odea, MD  Primary Care: [facility] Primary Cardiologist: Debbe Odea, MD  HPI:  Erik Black is a 72 y.o. male with morbid obesity, HTN, OSA on CPAP, DM2 HFrEF EF 30 to 35%, diabetes, former smoker  Diagnosed with HF in 9/23. Echo 9/23 EF 30-35%. Started lasix. Referred to [facility]   Admitted 11/23 with ADHF cath as below. Treated with IV lasix and GDMT titrated. Diuresed 22 pounds.   Cath: 05/03/22  LAD 60%d LCx 55% prox RCA 30%m  RA 18 RV 68/15 PA 69/39 (41) PCWP 40  Fick 5.6/2.3   cMRI: 11/23 EF 25% mid wall LGE strip c/w NICM. RV moderately down.   Zio 11/23: Occasional NSVT 13.7% PVCs (one predominant morphology 13.6%)  Echo 3/24: EF 20-25% RV ok  Echo 7/24; EF 25-30% RV ok   Zio 6/24 Sinus. 2.1% PVCs (suppressed on mexilitene)  Underwent MDT ICD placement 01/28/23.   Admitted in 10/24 with mechanical fall. Saw Dr. Lalla Brothers in 10/24 - felt he was stable  Here for f/u with his wife.  FHx - Mother with HF - 33 Brothers with HF - 1 with ICD - Father with MI     Past Medical History:  Diagnosis Date   Acute on chronic combined systolic and diastolic CHF (congestive heart failure) (HCC) 05/03/2022   Acute on chronic systolic CHF (congestive heart failure) (HCC) 06/01/2022   Anxiety    Arthritis    Chest pain 01/22/2023   CHF (congestive heart failure) (HCC)    Depression    Diabetes mellitus without complication (HCC)    GERD (gastroesophageal reflux disease)    Glaucoma    both eyes   Hypertension    Hypokalemia 05/03/2022   Positive D dimer 06/01/2022   Sleep apnea    uses Cpap nightly    Current Outpatient Medications  Medication Sig Dispense Refill   acetaminophen (TYLENOL) 500 MG tablet Take 1,000 mg by mouth as needed for moderate pain (pain score 4-6).     amLODipine (NORVASC) 10 MG tablet Take 10 mg by mouth daily.     aspirin EC 81 MG tablet Take 1 tablet (81 mg  total) by mouth 2 (two) times daily for 6 weeks, then as directed by provider. 120 tablet 0   atorvastatin (LIPITOR) 40 MG tablet Take 1 tablet (40 mg total) by mouth at bedtime. 90 tablet 3   BREZTRI AEROSPHERE 160-9-4.8 MCG/ACT AERO Inhale 1 puff into the lungs 2 (two) times daily.     Brinzolamide-Brimonidine 1-0.2 % SUSP Place 1 drop into both eyes 3 (three) times daily.     carvedilol (COREG) 25 MG tablet TAKE 1 TABLET BY MOUTH TWICE A DAY 180 tablet 1   celecoxib (CELEBREX) 200 MG capsule Take 200 mg by mouth daily.     Cholecalciferol (VITAMIN D) 50 MCG (2000 UT) tablet Take 4,000 Units by mouth daily.      docusate sodium (COLACE) 100 MG capsule Take 100 mg by mouth daily as needed for mild constipation or moderate constipation.     DULoxetine (CYMBALTA) 60 MG capsule Take 60 mg by mouth daily.     ENTRESTO 97-103 MG TAKE 1 TABLET BY MOUTH TWICE A DAY 60 tablet 6   insulin glargine (LANTUS) 100 UNIT/ML Solostar Pen Inject 15 Units into the skin at bedtime. 15 mL 1   Insulin Pen Needle 32G X 4 MM MISC Use at bedtime. 100 each 1   latanoprost (XALATAN)  0.005 % ophthalmic solution Place 1 drop into both eyes at bedtime.     mexiletine (MEXITIL) 200 MG capsule TAKE 1 CAPSULE BY MOUTH TWICE A DAY 180 capsule 1   Multiple Vitamins-Minerals (MULTIVITAMIN WITH MINERALS) tablet Take 1 tablet by mouth daily.     Omega-3 1000 MG CAPS Take 1,000 mg by mouth 3 (three) times daily after meals.     omeprazole (PRILOSEC) 20 MG capsule Take 20 mg by mouth 2 (two) times daily.     OXcarbazepine (TRILEPTAL) 150 MG tablet Take 150 mg by mouth 2 (two) times daily.     potassium chloride SA (KLOR-CON M) 20 MEQ tablet Take 20 mEq by mouth daily.     Semaglutide, 2 MG/DOSE, (OZEMPIC, 2 MG/DOSE,) 8 MG/3ML SOPN Inject 2 mg into the skin once a week. 3 mL 11   spironolactone (ALDACTONE) 25 MG tablet TAKE 1 TABLET (25 MG TOTAL) BY MOUTH DAILY. 90 tablet 2   timolol (TIMOPTIC-XR) 0.5 % ophthalmic gel-forming Place 1  drop into both eyes daily.     torsemide (DEMADEX) 20 MG tablet TAKE 2 TABLETS (40 MG TOTAL) BY MOUTH DAILY. 180 tablet 1   No current facility-administered medications for this visit.    Allergies  Allergen Reactions   Empagliflozin Rash and Other (See Comments)    Hypersensitivity reaction   Methadone Itching   Oxycodone Itching    Hands started peeling and "could not swallow"   Lisinopril Cough      Social History   Socioeconomic History   Marital status: Married    Spouse name: [name]   Number of children: 0   Years of education: Not on file   Highest education level: Not on file  Occupational History   Occupation: Retired  Tobacco Use   Smoking status: Former    Current packs/day: 0.00    Types: Cigarettes    Quit date: 05/26/2019    Years since quitting: 4.0   Smokeless tobacco: Never   Tobacco comments:    smokes a pack a month  Vaping Use   Vaping status: Never Used  Substance and Sexual Activity   Alcohol use: Not Currently   Drug use: Not Currently   Sexual activity: Not on file  Other Topics Concern   Not on file  Social History Narrative   Lives at home with wife [name] and a dog.   Social Determinants of Health   Financial Resource Strain: Not on file  Food Insecurity: No Food Insecurity (04/14/2023)   Hunger Vital Sign    Worried About Running Out of Food in the Last Year: Never true    Ran Out of Food in the Last Year: Never true  Transportation Needs: No Transportation Needs (04/14/2023)   PRAPARE - Administrator, Civil Service (Medical): No    Lack of Transportation (Non-Medical): No  Physical Activity: Not on file  Stress: Not on file  Social Connections: Unknown (11/14/2021)   Received from [facility], [facility]   Social Network    Social Network: Not on file  Intimate Partner Violence: Not At Risk (04/14/2023)   Humiliation, Afraid, Rape, and Kick questionnaire    Fear of Current or Ex-Partner: No    Emotionally  Abused: No    Physically Abused: No    Sexually Abused: No      Family History  Problem Relation Age of Onset   Hypertension Mother    Diabetes Mother    Heart disease Mother  Stroke Mother    Heart disease Father    High blood pressure Father    Alcoholism Father    Drug abuse Father    Heart disease Brother     Vitals:   06/06/23 0947  BP: 128/78  Pulse: 83  SpO2: 100%  Weight: 278 lb 8 oz (126.3 kg)    Wt Readings from Last 3 Encounters:  06/06/23 278 lb 8 oz (126.3 kg)  06/05/23 271 lb (122.9 kg)  05/22/23 271 lb (122.9 kg)     PHYSICAL EXAM: General:  Well appearing. No resp difficulty HEENT: normal Neck: supple. no JVD. Carotids 2+ bilat; no bruits. No lymphadenopathy or thryomegaly appreciated. Cor: PMI nondisplaced. Regular rate & rhythm. No rubs, gallops or murmurs. ICD site ok Lungs: clear Abdomen: obese soft, nontender, nondistended. No hepatosplenomegaly. No bruits or masses. Good bowel sounds. Extremities: no cyanosis, clubbing, rash, edema Neuro: alert & orientedx3, cranial nerves grossly intact. moves all 4 extremities w/o difficulty. Affect pleasant  ASSESSMENT & PLAN:   1. Chronic systolic HF - Echocardiogram 03/2022 EF 30 to 35% - Cath 11/23 non-obstructive CAD - Admit 11/23 with marked volume overload.  - cMRI: 11/23 EF 25% mid wall LGE strip c/w NICM. RV moderately down.  - Zio 11/23: Occasional NSVT 13.7% PVCs (one predominant morphology 13.6%) - Echo 3/24: EF 20-25% RV ok  - Echo 7/24; EF 25-30% RV ok  - Underwent MDT ICD placement 01/28/23 - Suspect PVC CM vs familial (LMNA)  No evidence of infiltrative process on MRI  - EF has not improved with PVC suppression with mexilitene. He has met with Dr. Jomarie Longs in [place] who agrees that Fhx is concerning for potential genetic CM however he has no first-degree relatives that would benefit from screening so has decided not to pursue - Stable NYHA II-III - Volume status looks good on torsemide  40 daily - Continue Entresto to 97/103 bid - Continue carvedilol 25 bid - Continue spiro 25 daily - Patient did not tolerate Jardiance in the past, developed yeast infection - Can consider CCM or Barostim (versus VAD) down the road if symptoms progress  2. Frequent PVCs - Zio 11/23: Occasional NSVT 13.7% PVCs (one predominant morphology 13.6%) - Continue mexilitene 200 bid for suppression  - Zio 6/24 Sinus. 2.1% PVCs (suppressed on mexilitene)  3. HTN - Blood pressure well controlled. Continue current regimen.  4. OSA - compliant with CPAP  5. Morbid obesity  - continue GLP-1 RA - Body mass index is 41.13 kg/m.  Arvilla Meres, MD  12:28 PM

## 2023-06-11 ENCOUNTER — Ambulatory Visit (HOSPITAL_COMMUNITY)
Admission: RE | Admit: 2023-06-11 | Discharge: 2023-06-11 | Disposition: A | Discharge status: Home / Self Care | Payer: Medicare HMO | Source: Ambulatory Visit | Attending: Surgery | Admitting: Surgery

## 2023-06-11 DIAGNOSIS — I5020 Unspecified systolic (congestive) heart failure: Secondary | ICD-10-CM | POA: Insufficient documentation

## 2023-06-11 DIAGNOSIS — Z01818 Encounter for other preprocedural examination: Secondary | ICD-10-CM | POA: Insufficient documentation

## 2023-06-28 ENCOUNTER — Encounter: Payer: Self-pay | Admitting: Surgery

## 2023-06-28 ENCOUNTER — Ambulatory Visit (INDEPENDENT_AMBULATORY_CARE_PROVIDER_SITE_OTHER): Payer: Medicare HMO | Admitting: Surgery

## 2023-06-28 VITALS — BP 136/86 | HR 81 | Temp 97.8°F | Wt 279.0 lb

## 2023-06-28 DIAGNOSIS — I5042 Chronic combined systolic (congestive) and diastolic (congestive) heart failure: Secondary | ICD-10-CM

## 2023-06-28 NOTE — Progress Notes (Signed)
[facility]  Patient name: Jamicah Sennett MRN: [id number] DOB: Sep 07, 1950 Sex: male   REQUESTING PROVIDER:   Dr. Sampson Goon   REASON FOR CONSULT:    Barostim  HISTORY OF PRESENT ILLNESS:   Decorian Camarena is a 72 y.o. male, who is referred for evaluation of Barostim therapy.  The patient suffers from NYHA II-III disease secondary to chronic combined congestive heart failure..  Recent ejection fraction is 25-30%.  He is on goal-directed medical therapy but still has persistent symptoms including fatigue and weakness.  The patient suffers from morbid obesity.  He is medically managed for hypertension.  He takes a statin for hypercholesterolemia.  He suffers from diabetes.  PAST MEDICAL HISTORY    Past Medical History:  Diagnosis Date   Acute on chronic combined systolic and diastolic CHF (congestive heart failure) (HCC) 05/03/2022   Acute on chronic systolic CHF (congestive heart failure) (HCC) 06/01/2022   Anxiety    Arthritis    Chest pain 01/22/2023   CHF (congestive heart failure) (HCC)    Depression    Diabetes mellitus without complication (HCC)    GERD (gastroesophageal reflux disease)    Glaucoma    both eyes   Hypertension    Hypokalemia 05/03/2022   Positive D dimer 06/01/2022   Sleep apnea    uses Cpap nightly     FAMILY HISTORY   Family History  Problem Relation Age of Onset   Hypertension Mother    Diabetes Mother    Heart disease Mother    Stroke Mother    Heart disease Father    High blood pressure Father    Alcoholism Father    Drug abuse Father    Heart disease Brother     SOCIAL HISTORY:   Social History   Socioeconomic History   Marital status: Married    Spouse name: [name]   Number of children: 0   Years of education: Not on file   Highest education level: Not on file  Occupational History   Occupation: Retired  Tobacco Use   Smoking status: Former     Current packs/day: 0.00    Types: Cigarettes    Quit date: 05/26/2019    Years since quitting: 4.0   Smokeless tobacco: Never   Tobacco comments:    smokes a pack a month  Vaping Use   Vaping status: Never Used  Substance and Sexual Activity   Alcohol use: Not Currently   Drug use: Not Currently   Sexual activity: Not on file  Other Topics Concern   Not on file  Social History Narrative   Lives at home with wife [name] and a dog.   Social Drivers of Corporate investment banker Strain: Not on file  Food Insecurity: No Food Insecurity (04/14/2023)   Hunger Vital Sign    Worried About Running Out of Food in the Last Year: Never true    Ran Out of Food in the Last Year: Never true  Transportation Needs: No Transportation Needs (04/14/2023)   PRAPARE - Administrator, Civil Service (Medical): No    Lack of Transportation (Non-Medical): No  Physical Activity: Not on file  Stress: Not on file  Social Connections: Unknown (11/14/2021)   Received from [facility]   Social Network    Social Network: Not on file  Intimate Partner Violence: Not At Risk (04/14/2023)   Humiliation, Afraid, Rape, and Kick questionnaire    Fear of Current  or Ex-Partner: No    Emotionally Abused: No    Physically Abused: No    Sexually Abused: No    ALLERGIES:    Allergies  Allergen Reactions   Empagliflozin Rash and Other (See Comments)    Hypersensitivity reaction   Methadone Itching   Oxycodone Itching    Hands started peeling and "could not swallow"   Lisinopril Cough    CURRENT MEDICATIONS:    Current Outpatient Medications  Medication Sig Dispense Refill   acetaminophen (TYLENOL) 500 MG tablet Take 1,000 mg by mouth as needed for moderate pain (pain score 4-6).     amLODipine (NORVASC) 10 MG tablet Take 10 mg by mouth daily.     aspirin EC 81 MG tablet Take 1 tablet (81 mg total) by mouth 2 (two) times daily for 6 weeks, then as directed by provider. 120  tablet 0   atorvastatin (LIPITOR) 40 MG tablet Take 1 tablet (40 mg total) by mouth at bedtime. 90 tablet 3   BREZTRI AEROSPHERE 160-9-4.8 MCG/ACT AERO Inhale 1 puff into the lungs 2 (two) times daily.     Brinzolamide-Brimonidine 1-0.2 % SUSP Place 1 drop into both eyes 3 (three) times daily.     carvedilol (COREG) 25 MG tablet TAKE 1 TABLET BY MOUTH TWICE A DAY 180 tablet 1   celecoxib (CELEBREX) 200 MG capsule Take 200 mg by mouth daily.     Cholecalciferol (VITAMIN D) 50 MCG (2000 UT) tablet Take 4,000 Units by mouth daily.      docusate sodium (COLACE) 100 MG capsule Take 100 mg by mouth daily as needed for mild constipation or moderate constipation.     DULoxetine (CYMBALTA) 60 MG capsule Take 60 mg by mouth daily.     ENTRESTO 97-103 MG TAKE 1 TABLET BY MOUTH TWICE A DAY 60 tablet 6   insulin glargine (LANTUS) 100 UNIT/ML Solostar Pen Inject 15 Units into the skin at bedtime. 15 mL 1   Insulin Pen Needle 32G X 4 MM MISC Use at bedtime. 100 each 1   latanoprost (XALATAN) 0.005 % ophthalmic solution Place 1 drop into both eyes at bedtime.     mexiletine (MEXITIL) 200 MG capsule TAKE 1 CAPSULE BY MOUTH TWICE A DAY 180 capsule 1   Multiple Vitamins-Minerals (MULTIVITAMIN WITH MINERALS) tablet Take 1 tablet by mouth daily.     Omega-3 1000 MG CAPS Take 1,000 mg by mouth 3 (three) times daily after meals.     omeprazole (PRILOSEC) 20 MG capsule Take 20 mg by mouth 2 (two) times daily.     OXcarbazepine (TRILEPTAL) 150 MG tablet Take 150 mg by mouth 2 (two) times daily.     potassium chloride SA (KLOR-CON M) 20 MEQ tablet Take 20 mEq by mouth daily.     Semaglutide, 2 MG/DOSE, (OZEMPIC, 2 MG/DOSE,) 8 MG/3ML SOPN Inject 2 mg into the skin once a week. 3 mL 11   spironolactone (ALDACTONE) 25 MG tablet TAKE 1 TABLET (25 MG TOTAL) BY MOUTH DAILY. 90 tablet 2   timolol (TIMOPTIC-XR) 0.5 % ophthalmic gel-forming Place 1 drop into both eyes daily.     torsemide (DEMADEX) 20 MG tablet TAKE 2 TABLETS  (40 MG TOTAL) BY MOUTH DAILY. 180 tablet 1   No current facility-administered medications for this visit.    REVIEW OF SYSTEMS:   [X]  denotes positive finding, [ ]  denotes negative finding Cardiac  Comments:  Chest pain or chest pressure:    Shortness of breath upon exertion:  Short of breath when lying flat:    Irregular heart rhythm:        Vascular    Pain in calf, thigh, or hip brought on by ambulation:    Pain in feet at night that wakes you up from your sleep:     Blood clot in your veins:    Leg swelling:         Pulmonary    Oxygen at home:    Productive cough:     Wheezing:         Neurologic    Sudden weakness in arms or legs:     Sudden numbness in arms or legs:     Sudden onset of difficulty speaking or slurred speech:    Temporary loss of vision in one eye:     Problems with dizziness:         Gastrointestinal    Blood in stool:      Vomited blood:         Genitourinary    Burning when urinating:     Blood in urine:        Psychiatric    Major depression:         Hematologic    Bleeding problems:    Problems with blood clotting too easily:        Skin    Rashes or ulcers:        Constitutional    Fever or chills:     PHYSICAL EXAM:   Vitals:   06/28/23 0859  BP: 136/86  Pulse: 81  Temp: 97.8 F (36.6 C)  TempSrc: Temporal  SpO2: 97%  Weight: 279 lb (126.6 kg)    GENERAL: The patient is a well-nourished male, in no acute distress. The vital signs are documented above. CARDIAC: There is a regular rate and rhythm.  VASCULAR: SonoSite was used to evaluate the right carotid bifurcation which is in the mid neck PULMONARY: Nonlabored respirations ABDOMEN: Soft and non-tender with normal pitched bowel sounds.  MUSCULOSKELETAL: There are no major deformities or cyanosis. NEUROLOGIC: No focal weakness or paresthesias are detected. SKIN: There are no ulcers or rashes noted. PSYCHIATRIC: The patient has a normal affect.  STUDIES:   I  have reviewed the following ultrasound: Right Carotid: Velocities in the right ICA are consistent with a 1-39%  stenosis.                The extracranial vessels were near-normal with only minimal  wall                thickening or plaque.   Left Carotid: Velocities in the left ICA are consistent with a 1-39%  stenosis.               The extracranial vessels were near-normal with only minimal  wall               thickening or plaque.   ASSESSMENT and PLAN   NYHA class III: The patient still has severe fatigue and weakness despite goal-directed medical therapy.  His ejection fraction is in the 20 to 25% range.  We discussed proceeding with Barostim therapy.  I discussed the details the operation as well as the risks and benefits.  All questions were answered.  He would like to proceed as soon as possible.   Charlena Cross, MD, FACS [facility] [phone] Pager [phone]

## 2023-07-11 ENCOUNTER — Ambulatory Visit (INDEPENDENT_AMBULATORY_CARE_PROVIDER_SITE_OTHER): Payer: Medicare HMO | Admitting: Internal Medicine

## 2023-07-11 ENCOUNTER — Encounter (INDEPENDENT_AMBULATORY_CARE_PROVIDER_SITE_OTHER): Payer: Self-pay | Admitting: Internal Medicine

## 2023-07-11 VITALS — BP 136/82 | HR 78 | Temp 97.5°F | Ht 69.0 in | Wt 270.0 lb

## 2023-07-11 DIAGNOSIS — Z6841 Body Mass Index (BMI) 40.0 and over, adult: Secondary | ICD-10-CM

## 2023-07-11 DIAGNOSIS — N1831 Chronic kidney disease, stage 3a: Secondary | ICD-10-CM

## 2023-07-11 DIAGNOSIS — R948 Abnormal results of function studies of other organs and systems: Secondary | ICD-10-CM

## 2023-07-11 DIAGNOSIS — E162 Hypoglycemia, unspecified: Secondary | ICD-10-CM | POA: Insufficient documentation

## 2023-07-11 DIAGNOSIS — E1122 Type 2 diabetes mellitus with diabetic chronic kidney disease: Secondary | ICD-10-CM

## 2023-07-11 DIAGNOSIS — I5020 Unspecified systolic (congestive) heart failure: Secondary | ICD-10-CM | POA: Diagnosis not present

## 2023-07-11 DIAGNOSIS — E66813 Obesity, class 3: Secondary | ICD-10-CM

## 2023-07-11 DIAGNOSIS — Z794 Long term (current) use of insulin: Secondary | ICD-10-CM

## 2023-07-11 DIAGNOSIS — Z7985 Long-term (current) use of injectable non-insulin antidiabetic drugs: Secondary | ICD-10-CM

## 2023-07-11 NOTE — Assessment & Plan Note (Signed)
 Patient represents a case of complex obesity.  He is 73 years old has multiple comorbid conditions, low volume of physical activity and an abnormal metabolism.  He is currently on GLP-1 therapy for cardiovascular benefits and also weight management but because of a lack of appetite and increase satiety he has been skipping meals.  This is likely resulting in a restrictive pattern of eating which may cause metabolic slowing.  On food recall he is not exceeding calories and has been working with his wife on eating more whole foods and reducing simple and added sugars.  We discussed the importance of doing 3 meals a day targeting about 70 g of protein per day (adjusted to chronic kidney disease).  He has a CGM and was advised to continue to monitor for hypoglycemia.  He did not bring his reader in.  GLP-1 may cause hypoglycemia in about 4% of cases but when combined with insulin  this may be a little bit higher.  His insulin  is being titrated down by endocrinology.  I feel that we need to reduce his GLP-1 currently Ozempic  to 1 mg once a week to allow patient to reach adequate nutritional intake which may help boost his metabolic rate.  I have sent electronic correspondence to cardiology as they are managing his GLP-1 therapy.

## 2023-07-11 NOTE — Assessment & Plan Note (Signed)
 As above.

## 2023-07-11 NOTE — Progress Notes (Signed)
 Office: [phone]  /  Fax: [phone]  Weight Summary And Biometrics  Vitals Temp: (!) 97.5 F (36.4 C) BP: 136/82 Pulse Rate: 78 SpO2: 100 %   Anthropometric Measurements Height: 5' 9 (1.753 m) Weight: 271 lb (122.9 kg) BMI (Calculated): 40 Weight at Last Visit: 271 lb Weight Lost Since Last Visit: 1 lb Weight Gained Since Last Visit: 0 lb Starting Weight: 271 lb Total Weight Loss (lbs): 1 lb (0.454 kg) Peak Weight: 325 lb   Body Composition  Body Fat %: 41 % Fat Mass (lbs): 111 lbs Muscle Mass (lbs): 152 lbs Total Body Water  (lbs): 108.6 lbs Visceral Fat Rating : 28    RMR: 1901  Today's Visit #: 3  Starting Date: 05/22/23   Subjective   Chief Complaint: Obesity  Erik Black is here to discuss his progress with his obesity treatment plan. He is on the 1500 calorie plan and states he is following his eating plan approximately 0 % of the time. He states he is not exercising.  Interval History:   Since last office visit he has lost 1 pounds. He reports suboptimal adherence He has been working on increasing protein intake at every meal, eating more vegetables, drinking more water , avoiding and / or reducing liquid calories, avoiding or reducing simple and processed carbohydrates, making healthier choices, and incorporating more whole foods .  Patient acknowledges skipping meals due to decreased appetite and sense of fullness.  He has a history of heart failure but is also on GLP-1 therapy which may be playing a role.  Sleeping approximately 6 hours a day.  Sleep described as interrupted.   Stress levels are reported as low and manageable.   Orexigenic Control:  Denies problems with appetite and hunger signals.  Denies problems with satiety and satiation.  Denies problems with eating patterns and portion control.  Denies abnormal cravings. Denies feeling deprived or restricted.   Barriers identified: low volume of physical activity at present ,  orthopedic problems, medical conditions or chronic pain affecting mobility, medical comorbidities, presence of obesogenic drugs, and Chronic skipping of meals due to GLP1 .   Pharmacotherapy for weight loss: He is currently taking Ozempic  with diabetes as the primary indication with adequate clinical response  and without side effects..   Assessment and Plan   Treatment Plan For Obesity:  Recommended Dietary Goals  Vandy is currently in the action stage of change. As such, his goal is to continue weight management plan. He has agreed to: continue to work on implementation of reduced calorie nutrition plan (RCNP)  Behavioral Intervention  We discussed the following Behavioral Modification Strategies today: increasing lean protein intake to established goals, decreasing simple carbohydrates , increasing vegetables, increasing fiber rich foods, avoiding skipping meals, increasing water  intake , reading food labels , and counseled on maintaining a low-sodium diet .  Additional resources provided today: None  Recommended Physical Activity Goals  Galen has been advised to work up to 150 minutes of moderate intensity aerobic activity a week and strengthening exercises 2-3 times per week for cardiovascular health, weight loss maintenance and preservation of muscle mass.   He has agreed to :  Think about enjoyable ways to increase daily physical activity and overcoming barriers to exercise and Increase physical activity in their day and reduce sedentary time (increase NEAT).  Pharmacotherapy  We discussed various medication options to help Robertson with his weight loss efforts and we both agreed to :  We will review with cardiology about reducing GLP-1 therapy  due to restrictive eating pattern.  Associated Conditions Addressed and Impacted by Obesity Treatment  Class 3 severe obesity with serious comorbidity and body mass index (BMI) of 40.0 to 44.9 in adult, unspecified obesity type  [facility]) Assessment & Plan: Patient represents a case of complex obesity.  He is 73 years old has multiple comorbid conditions, low volume of physical activity and an abnormal metabolism.  He is currently on GLP-1 therapy for cardiovascular benefits and also weight management but because of a lack of appetite and increase satiety he has been skipping meals.  This is likely resulting in a restrictive pattern of eating which may cause metabolic slowing.  On food recall he is not exceeding calories and has been working with his wife on eating more whole foods and reducing simple and added sugars.  We discussed the importance of doing 3 meals a day targeting about 70 g of protein per day (adjusted to chronic kidney disease).  He has a CGM and was advised to continue to monitor for hypoglycemia.  He did not bring his reader in.  GLP-1 may cause hypoglycemia in about 4% of cases but when combined with insulin  this may be a little bit higher.  His insulin  is being titrated down by endocrinology.  I feel that we need to reduce his GLP-1 currently Ozempic  to 1 mg once a week to allow patient to reach adequate nutritional intake which may help boost his metabolic rate.  I have sent electronic correspondence to cardiology as they are managing his GLP-1 therapy.   Type 2 diabetes mellitus with stage 3a chronic kidney disease, with long-term current use of insulin  [facility]) Assessment & Plan: HgbA1c is at goal for age and comorbid conditions.  On Lantus  and Ozempic  with good adherence and has been experiencing lows.  Sometimes in the 60s.  Frequency undetermined did not bring monitor reader.  Counseled on goals of care, monitoring for complications and importance of staying updated on immunizations and diabetes preventive measures.   Lab Results  Component Value Date   HGBA1C 7.2 (H) 05/22/2023   HGBA1C 10.5 (H) 01/22/2023   HGBA1C 7.7 (H) 06/01/2022   Lab Results  Component Value Date   LDLCALC 91 01/23/2023    CREATININE 1.40 (H) 06/06/2023   Patient benefits from a reduced calorie nutrition plan that is low-carb but also modest and protein because of kidney disease.  He was counseled extensively on the carb insulin  model of obesity today and him and his wife will be working on reducing simple and added sugars in his diet.  His most recent A1c is 7.2 and improved from 10.5. He will follow-up with the primary care team to stay up-to-date on diabetes prevention.  His blood pressure is well-controlled.  His LDL was 91 should be under 70 so he may benefit from high intensity statins.    Abnormal metabolism Assessment & Plan: Patient has a slower than predicted metabolism. IC 1901 vs. calculated 2223. This may contribute to weight gain, chronic fatigue and difficulty losing weight.  This is likely secondary to low levels of physical activity , chronic conditions and possibly skipping of meals  We reviewed measures to improve metabolism including not skipping meals, progressive strengthening exercises, increasing protein intake at every meal and maintaining adequate hydration and sleep.     HFrEF (heart failure with reduced ejection fraction) (HCC) Assessment & Plan: I reviewed most recent echocardiogram which showed an ejection fraction of 25 to 30% relatively unchanged from baseline.  He is  current on guideline based medical therapy.  He is not aware that he has chronic kidney disease this medications may also be causing recurrent AKI's due to fluctuations in volume status.  He needs close monitoring of his kidney function as well as electrolytes.  He will follow-up with primary care team.  We discussed the importance of monitoring weight as well as maintaining a low sodium diet to less than 2000 mg a day.   Hypoglycemia Assessment & Plan: As above      Objective   Physical Exam:  Blood pressure 136/82, pulse 78, temperature (!) 97.5 F (36.4 C), height 5' 9 (1.753 m), weight 271 lb (122.9 kg),  SpO2 100%. Body mass index is 40.02 kg/m.  General: He is overweight, cooperative, alert, well developed, and in no acute distress. PSYCH: Has normal mood, affect and thought process.   HEENT: EOMI, sclerae are anicteric. Lungs: Normal breathing effort, no conversational dyspnea. Extremities: No edema.  Neurologic: No gross sensory or motor deficits. No tremors or fasciculations noted.    Diagnostic Data Reviewed:  BMET    Component Value Date/Time   NA 143 06/06/2023 1101   K 3.7 06/06/2023 1101   CL 104 06/06/2023 1101   CO2 23 06/06/2023 1101   GLUCOSE 104 (H) 06/06/2023 1101   GLUCOSE 115 (H) 04/14/2023 1421   BUN 15 06/06/2023 1101   CREATININE 1.40 (H) 06/06/2023 1101   CALCIUM  10.1 06/06/2023 1101   GFRNONAA 41 (L) 04/14/2023 1421   GFRAA 59 (L) 11/24/2019 0628   Lab Results  Component Value Date   HGBA1C 7.2 (H) 05/22/2023   HGBA1C 6.1 (H) 04/16/2019   No results found for: INSULIN  Lab Results  Component Value Date   TSH 0.774 12/05/2022   CBC    Component Value Date/Time   WBC 6.6 04/14/2023 1421   RBC 4.64 04/14/2023 1421   HGB 13.5 04/14/2023 1421   HCT 40.6 04/14/2023 1421   PLT 137 (L) 04/14/2023 1421   MCV 87.5 04/14/2023 1421   MCH 29.1 04/14/2023 1421   MCHC 33.3 04/14/2023 1421   RDW 13.0 04/14/2023 1421   Iron Studies No results found for: IRON, TIBC, FERRITIN, IRONPCTSAT Lipid Panel     Component Value Date/Time   CHOL 152 01/23/2023 0526   TRIG 134 01/23/2023 0526   HDL 34 (L) 01/23/2023 0526   CHOLHDL 4.5 01/23/2023 0526   VLDL 27 01/23/2023 0526   LDLCALC 91 01/23/2023 0526   Hepatic Function Panel     Component Value Date/Time   PROT 7.2 01/22/2023 0935   ALBUMIN  4.1 01/22/2023 0935   AST 20 01/22/2023 0935   ALT 15 01/22/2023 0935   ALKPHOS 120 01/22/2023 0935   BILITOT 0.8 01/22/2023 0935      Component Value Date/Time   TSH 0.774 12/05/2022 1147   Nutritional Lab Results  Component Value Date   VD25OH  49.3 05/22/2023    Follow-Up   Return in about 3 weeks (around 08/01/2023) for For Weight Mangement with Dr. Francyne.SABRA He was informed of the importance of frequent follow up visits to maximize his success with intensive lifestyle modifications for his multiple health conditions.  Attestation Statement   Reviewed by clinician on day of visit: allergies, medications, problem list, medical history, surgical history, family history, social history, and previous encounter notes.   I have spent 40 minutes in the care of the patient today including: preparing to see patient (e.g. review and interpretation of tests, old notes ), obtaining and/or  reviewing separately obtained history, performing a medically appropriate examination or evaluation, counseling and educating the patient, referring and communicating with other healthcare professionals, documenting clinical information in the electronic or other health care record, and independently interpreting results and communicating results to the patient, family, or caregiver   Lucas Parker, MD

## 2023-07-11 NOTE — Assessment & Plan Note (Signed)
 Patient has a slower than predicted metabolism. IC 1901 vs. calculated 2223. This may contribute to weight gain, chronic fatigue and difficulty losing weight.  This is likely secondary to low levels of physical activity , chronic conditions and possibly skipping of meals  We reviewed measures to improve metabolism including not skipping meals, progressive strengthening exercises, increasing protein intake at every meal and maintaining adequate hydration and sleep.

## 2023-07-11 NOTE — Assessment & Plan Note (Signed)
 HgbA1c is at goal for age and comorbid conditions.  On Lantus  and Ozempic  with good adherence and has been experiencing lows.  Sometimes in the 60s.  Frequency undetermined did not bring monitor reader.  Counseled on goals of care, monitoring for complications and importance of staying updated on immunizations and diabetes preventive measures.   Lab Results  Component Value Date   HGBA1C 7.2 (H) 05/22/2023   HGBA1C 10.5 (H) 01/22/2023   HGBA1C 7.7 (H) 06/01/2022   Lab Results  Component Value Date   LDLCALC 91 01/23/2023   CREATININE 1.40 (H) 06/06/2023   Patient benefits from a reduced calorie nutrition plan that is low-carb but also modest and protein because of kidney disease.  He was counseled extensively on the carb insulin  model of obesity today and him and his wife will be working on reducing simple and added sugars in his diet.  His most recent A1c is 7.2 and improved from 10.5. He will follow-up with the primary care team to stay up-to-date on diabetes prevention.  His blood pressure is well-controlled.  His LDL was 91 should be under 70 so he may benefit from high intensity statins.

## 2023-07-11 NOTE — Assessment & Plan Note (Signed)
 I reviewed most recent echocardiogram which showed an ejection fraction of 25 to 30% relatively unchanged from baseline.  He is current on guideline based medical therapy.  He is not aware that he has chronic kidney disease this medications may also be causing recurrent AKI's due to fluctuations in volume status.  He needs close monitoring of his kidney function as well as electrolytes.  He will follow-up with primary care team.  We discussed the importance of monitoring weight as well as maintaining a low sodium diet to less than 2000 mg a day.

## 2023-07-22 ENCOUNTER — Encounter (INDEPENDENT_AMBULATORY_CARE_PROVIDER_SITE_OTHER): Payer: Self-pay | Admitting: Internal Medicine

## 2023-07-26 ENCOUNTER — Other Ambulatory Visit: Payer: Self-pay | Admitting: Internal Medicine

## 2023-07-30 ENCOUNTER — Encounter: Payer: Self-pay | Admitting: Cardiology

## 2023-07-30 ENCOUNTER — Ambulatory Visit (INDEPENDENT_AMBULATORY_CARE_PROVIDER_SITE_OTHER): Payer: Medicare HMO

## 2023-07-30 DIAGNOSIS — I428 Other cardiomyopathies: Secondary | ICD-10-CM | POA: Diagnosis not present

## 2023-07-30 LAB — CUP PACEART REMOTE DEVICE CHECK
Battery Remaining Longevity: 164 mo
Battery Voltage: 3.05 V
Brady Statistic RA Percent Paced: INVALID
Brady Statistic RV Percent Paced: 0 %
Date Time Interrogation Session: 20250128021447
HighPow Impedance: 70 Ohm
Implantable Lead Connection Status: 753985
Implantable Lead Implant Date: 20240729
Implantable Lead Location: 753860
Implantable Pulse Generator Implant Date: 20240729
Lead Channel Impedance Value: 684 Ohm
Lead Channel Impedance Value: 722 Ohm
Lead Channel Pacing Threshold Amplitude: 0.375 V
Lead Channel Pacing Threshold Pulse Width: 0.4 ms
Lead Channel Sensing Intrinsic Amplitude: 6.8 mV
Lead Channel Setting Pacing Amplitude: 1.5 V
Lead Channel Setting Pacing Pulse Width: 0.4 ms
Lead Channel Setting Sensing Sensitivity: 0.3 mV
Zone Setting Status: 755011

## 2023-08-08 ENCOUNTER — Ambulatory Visit (INDEPENDENT_AMBULATORY_CARE_PROVIDER_SITE_OTHER): Payer: Medicare HMO | Admitting: Internal Medicine

## 2023-08-08 ENCOUNTER — Encounter (INDEPENDENT_AMBULATORY_CARE_PROVIDER_SITE_OTHER): Payer: Self-pay | Admitting: Internal Medicine

## 2023-08-08 VITALS — BP 124/83 | HR 82 | Temp 97.5°F | Ht 69.0 in | Wt 270.0 lb

## 2023-08-08 DIAGNOSIS — E669 Obesity, unspecified: Secondary | ICD-10-CM

## 2023-08-08 DIAGNOSIS — Z794 Long term (current) use of insulin: Secondary | ICD-10-CM

## 2023-08-08 DIAGNOSIS — E1122 Type 2 diabetes mellitus with diabetic chronic kidney disease: Secondary | ICD-10-CM | POA: Diagnosis not present

## 2023-08-08 DIAGNOSIS — G4733 Obstructive sleep apnea (adult) (pediatric): Secondary | ICD-10-CM | POA: Diagnosis not present

## 2023-08-08 DIAGNOSIS — Z6839 Body mass index (BMI) 39.0-39.9, adult: Secondary | ICD-10-CM

## 2023-08-08 DIAGNOSIS — R948 Abnormal results of function studies of other organs and systems: Secondary | ICD-10-CM

## 2023-08-08 DIAGNOSIS — I5020 Unspecified systolic (congestive) heart failure: Secondary | ICD-10-CM

## 2023-08-08 DIAGNOSIS — Z7985 Long-term (current) use of injectable non-insulin antidiabetic drugs: Secondary | ICD-10-CM

## 2023-08-08 DIAGNOSIS — N1831 Chronic kidney disease, stage 3a: Secondary | ICD-10-CM

## 2023-08-08 NOTE — Progress Notes (Signed)
 Office: [phone]  /  Fax: [phone]  Weight Summary And Biometrics  Vitals Temp: (!) 97.5 F (36.4 C) BP: 124/83 Pulse Rate: 82 SpO2: 100 %   Anthropometric Measurements Height: 5' 9 (1.753 m) Weight: 270 lb (122.5 kg) BMI (Calculated): 39.85 Weight at Last Visit: 270 lb Weight Lost Since Last Visit: 1 lb Weight Gained Since Last Visit: 0 lb Starting Weight: 271 lb Total Weight Loss (lbs): 0 lb (0 kg) Peak Weight: 325 lb   Body Composition  Body Fat %: 40 % Fat Mass (lbs): 108.4 lbs Muscle Mass (lbs): 154.4 lbs Total Body Water  (lbs): 110.2 lbs Visceral Fat Rating : 27    RMR: 1901  Today's Visit #: 4  Starting Date: 05/22/23   Subjective   Chief Complaint: Obesity  Erik Black is here to discuss his progress with his obesity treatment plan. He is on the 1500 calorie plan and states he is following his eating plan approximately 90 % of the time. He states he is not exercising.  Weight Progress Since Last Visit:  Since last office visit he has lost 1 pounds. He reports good adherence to reduced calorie nutritional plan. He has been working on increasing protein intake at every meal, eating more vegetables, drinking more water , making healthier choices, reducing portion sizes, and incorporating more whole foods   Challenges affecting patient progress: low volume of physical activity at present , orthopedic problems, medical conditions or chronic pain affecting mobility, medical comorbidities, and sleep apnea.   Erik Black Control: Denies problems with appetite and hunger signals.  Denies problems with satiety and satiation.  Denies problems with eating patterns and portion control.  Denies abnormal cravings. Denies feeling deprived or restricted.   Pharmacotherapy for weight management: He is currently taking Ozempic  with diabetes as the primary indication with adequate clinical response  and experiencing the following side effects: Restrictive  eating pattern due to loss of appetite..   Assessment and Plan   Treatment Plan For Obesity:  Recommended Dietary Goals  Erik Black is currently in the action stage of change. As such, his goal is to continue weight management plan. He has agreed to: continue current plan  Behavioral Health and Counseling  We discussed the following behavioral modification strategies today: continue to work on maintaining a reduced calorie state, getting the recommended amount of protein, incorporating whole foods, making healthy choices, staying well hydrated and practicing mindfulness when eating..  Additional education and resources provided today: None  Recommended Physical Activity Goals  Erik Black has been advised to work up to 150 minutes of moderate intensity aerobic activity a week and strengthening exercises 2-3 times per week for cardiovascular health, weight loss maintenance and preservation of muscle mass.   He has agreed to :  Think about enjoyable ways to increase daily physical activity and overcoming barriers to exercise and Increase physical activity in their day and reduce sedentary time (increase NEAT).  Pharmacotherapy  We discussed various medication options to help Erik Black with his weight loss efforts and we both agreed to :  After discussion with cardiology we will decrease Ozempic  to 1 mg once a week using click method.  Patient was instructed on this.  Associated Conditions Impacted by Obesity Treatment  OSA on CPAP  Type 2 diabetes mellitus with stage 3a chronic kidney disease, with long-term current use of insulin  (HCC)  HFrEF (heart failure with reduced ejection fraction) (HCC)  Abnormal metabolism    Assessment and Plan    Obesity /abnormal metabolism Metabolic rate is  lower than expected, burning 1900 calories instead of 2241 calories. Likely due to medical conditions, medications, physical activity levels, and meal skipping. Experiencing fatigue and reduced  appetite, contributing to metabolic slowdown. Discussed reducing Ozempic  to 1 mg to stimulate appetite and encourage three meals a day to boost metabolism. Emphasized the importance of physical activity, adequate sleep, protein intake, and hydration to improve metabolic rate. - Reduce Ozempic  dose to 1 mg by counting 36 clicks on the 2 mg pen - Encourage three meals a day - Increase protein intake with each meal - Ensure adequate hydration - Promote physical activity - Ensure adequate sleep and use of CPAP for sleep apnea  Type 2 diabetes We are reducing Ozempic  to 1 mg once a week due to restrictive eating pattern.  His last A1c was in the 7 range which is adequate for his age.  He is also making nutritional changes so anticipate further improvements in his glycemic control.  Monitor for worsening glycemia due to reduction in GLP-1.   General Health Maintenance Advised gradual lifestyle changes to improve overall health and prevent further decline in muscle strength and mobility. Discussed use of YouTube videos for chair exercises for seniors and importance of not succumbing to inactivity due to pain. Mentioned potential benefits of sugar-free popsicles for throat soothing. - Encourage use of YouTube videos for chair exercises for seniors - Promote gradual increase in physical activity - Advise on importance of not succumbing to inactivity due to pain  Follow-up - Schedule follow-up appointment in 4 weeks.        Objective   Physical Exam:  Blood pressure 124/83, pulse 82, temperature (!) 97.5 F (36.4 C), height 5' 9 (1.753 m), weight 270 lb (122.5 kg), SpO2 100%. Body mass index is 39.87 kg/m.  General: He is overweight, cooperative, alert, well developed, and in no acute distress. PSYCH: Has normal mood, affect and thought process.   HEENT: EOMI, sclerae are anicteric. Lungs: Normal breathing effort, no conversational dyspnea. Extremities: No edema.  Neurologic: No gross  sensory or motor deficits. No tremors or fasciculations noted.    Diagnostic Data Reviewed:  BMET    Component Value Date/Time   NA 143 06/06/2023 1101   K 3.7 06/06/2023 1101   CL 104 06/06/2023 1101   CO2 23 06/06/2023 1101   GLUCOSE 104 (H) 06/06/2023 1101   GLUCOSE 115 (H) 04/14/2023 1421   BUN 15 06/06/2023 1101   CREATININE 1.40 (H) 06/06/2023 1101   CALCIUM  10.1 06/06/2023 1101   GFRNONAA 41 (L) 04/14/2023 1421   GFRAA 59 (L) 11/24/2019 0628   Lab Results  Component Value Date   HGBA1C 7.2 (H) 05/22/2023   HGBA1C 6.1 (H) 04/16/2019   No results found for: INSULIN  Lab Results  Component Value Date   TSH 0.774 12/05/2022   CBC    Component Value Date/Time   WBC 6.6 04/14/2023 1421   RBC 4.64 04/14/2023 1421   HGB 13.5 04/14/2023 1421   HCT 40.6 04/14/2023 1421   PLT 137 (L) 04/14/2023 1421   MCV 87.5 04/14/2023 1421   MCH 29.1 04/14/2023 1421   MCHC 33.3 04/14/2023 1421   RDW 13.0 04/14/2023 1421   Iron Studies No results found for: IRON, TIBC, FERRITIN, IRONPCTSAT Lipid Panel     Component Value Date/Time   CHOL 152 01/23/2023 0526   TRIG 134 01/23/2023 0526   HDL 34 (L) 01/23/2023 0526   CHOLHDL 4.5 01/23/2023 0526   VLDL 27 01/23/2023 0526   LDLCALC  91 01/23/2023 0526   Hepatic Function Panel     Component Value Date/Time   PROT 7.2 01/22/2023 0935   ALBUMIN  4.1 01/22/2023 0935   AST 20 01/22/2023 0935   ALT 15 01/22/2023 0935   ALKPHOS 120 01/22/2023 0935   BILITOT 0.8 01/22/2023 0935      Component Value Date/Time   TSH 0.774 12/05/2022 1147   Nutritional Lab Results  Component Value Date   VD25OH 49.3 05/22/2023    Follow-Up   No follow-ups on file.SABRA He was informed of the importance of frequent follow up visits to maximize his success with intensive lifestyle modifications for his multiple health conditions.  Attestation Statement   Reviewed by clinician on day of visit: allergies, medications, problem list, medical  history, surgical history, family history, social history, and previous encounter notes.   I have spent 30 minutes in the care of the patient today including: preparing to see patient (e.g. review and interpretation of tests, old notes ), obtaining and/or reviewing separately obtained history, performing a medically appropriate examination or evaluation, counseling and educating the patient, referring and communicating with other healthcare professionals, documenting clinical information in the electronic or other health care record, and independently interpreting results and communicating results to the patient, family, or caregiver   Lucas Parker, MD

## 2023-08-12 ENCOUNTER — Encounter: Payer: Self-pay | Admitting: Orthopedic Surgery

## 2023-08-13 ENCOUNTER — Other Ambulatory Visit: Payer: Self-pay | Admitting: Orthopedic Surgery

## 2023-08-13 DIAGNOSIS — M25511 Pain in right shoulder: Secondary | ICD-10-CM

## 2023-08-24 ENCOUNTER — Other Ambulatory Visit: Payer: Self-pay | Admitting: Student

## 2023-08-26 ENCOUNTER — Other Ambulatory Visit: Payer: Self-pay

## 2023-08-26 NOTE — Telephone Encounter (Signed)
 Pt is a CHF pt and his primary Cardiologist is in [place]. Please address

## 2023-08-30 ENCOUNTER — Other Ambulatory Visit: Payer: Self-pay | Admitting: Student

## 2023-08-30 ENCOUNTER — Ambulatory Visit: Payer: Medicare HMO | Attending: Cardiology | Admitting: Cardiology

## 2023-08-30 ENCOUNTER — Other Ambulatory Visit: Payer: Self-pay

## 2023-08-30 ENCOUNTER — Encounter: Payer: Self-pay | Admitting: Cardiology

## 2023-08-30 VITALS — BP 110/80 | HR 80 | Ht 70.0 in | Wt 279.0 lb

## 2023-08-30 DIAGNOSIS — I2510 Atherosclerotic heart disease of native coronary artery without angina pectoris: Secondary | ICD-10-CM | POA: Diagnosis not present

## 2023-08-30 DIAGNOSIS — I428 Other cardiomyopathies: Secondary | ICD-10-CM

## 2023-08-30 DIAGNOSIS — I10 Essential (primary) hypertension: Secondary | ICD-10-CM | POA: Diagnosis not present

## 2023-08-30 DIAGNOSIS — Z95810 Presence of automatic (implantable) cardiac defibrillator: Secondary | ICD-10-CM

## 2023-08-30 NOTE — Progress Notes (Signed)
 Cardiology Office Note:    Date:  08/30/2023   ID:  Erik Black, DOB 08-20-1950, MRN [id number]  PCP:  [facility]   [facility] Providers Cardiologist:  Debbe Odea, MD Electrophysiologist:  Lanier Prude, MD     Referring MD: [facility]   Chief Complaint  Patient presents with   6 month follow up     Patient c/o shortness of breath & chest pain that comes and goes with and without activity.     History of Present Illness:    Erik Black is a 73 y.o. male with a hx of NICM EF 30 to 35%, s/p AICD 12/2022, nonobstructive CAD ( LHC -05/2022-60% mid to distal LAD, 55% proximal left circumflex, 30% mid RCA), , diabetes, former smoker who presents for follow-up.    Endorses fatigue.  Compliant with GDMT for heart failure as prescribed.  Denies edema.  Follow-up with CHF clinic, barotstim device recommended.  Was evaluated by vascular surgery, plans were made for device to be placed.  Apparently insurance did not approve, currently undergoing preauthorization process.   Prior notes Echo 12/2022 EF 30% Left heart cath 05/2022 60% mid to distal LAD, 55% proximal left circumflex, 30% mid RCA. Echocardiogram 03/2022 EF 30 to 35% CMR 05/2022 EF 25%, LV mid wall LGE at RV insertion points. Cardiac monitor 06/2022 frequent PVCs 13.7%  Past Medical History:  Diagnosis Date   Acute on chronic combined systolic and diastolic CHF (congestive heart failure) (HCC) 05/03/2022   Acute on chronic systolic CHF (congestive heart failure) (HCC) 06/01/2022   Anxiety    Arthritis    Chest pain 01/22/2023   CHF (congestive heart failure) (HCC)    Depression    Diabetes mellitus without complication (HCC)    GERD (gastroesophageal reflux disease)    Glaucoma    both eyes   Hypertension    Hypokalemia 05/03/2022   Positive D dimer 06/01/2022   Sleep apnea    uses Cpap nightly    Past Surgical History:  Procedure Laterality Date   CARPAL TUNNEL  RELEASE Bilateral    COLONOSCOPY     EYE SURGERY Bilateral    cataract surgery with lens implants   ICD IMPLANT N/A 01/28/2023   Procedure: ICD IMPLANT;  Surgeon: Lanier Prude, MD;  Location: [facility] INVASIVE CV LAB;  Service: Cardiovascular;  Laterality: N/A;   KNEE ARTHROPLASTY Right    KNEE ARTHROPLASTY Left    KNEE ARTHROSCOPY Right    KNEE ARTHROSCOPY Left    MULTIPLE TOOTH EXTRACTIONS     RIGHT/LEFT HEART CATH AND CORONARY ANGIOGRAPHY Bilateral 05/03/2022   Procedure: RIGHT/LEFT HEART CATH AND CORONARY ANGIOGRAPHY;  Surgeon: Marykay Lex, MD;  Location: [facility] INVASIVE CV LAB;  Service: Cardiovascular;  Laterality: Bilateral;   ROTATOR CUFF REPAIR Right    ROTATOR CUFF REPAIR W/ DISTAL CLAVICLE EXCISION Left    TOTAL HIP ARTHROPLASTY Right 05/11/2019   Procedure: RIGHT TOTAL HIP ARTHROPLASTY ANTERIOR APPROACH;  Surgeon: Tarry Kos, MD;  Location: [facility] OR;  Service: Orthopedics;  Laterality: Right;   TOTAL HIP ARTHROPLASTY Left 11/23/2019   TOTAL HIP ARTHROPLASTY Left 11/23/2019   Procedure: LEFT TOTAL HIP ARTHROPLASTY ANTERIOR APPROACH;  Surgeon: Tarry Kos, MD;  Location: [facility] OR;  Service: Orthopedics;  Laterality: Left;   VASECTOMY      Current Medications: Current Meds  Medication Sig   aspirin EC 81 MG tablet Take 1 tablet (81 mg total) by mouth 2 (two) times daily for 6  weeks, then as directed by provider.   atorvastatin (LIPITOR) 40 MG tablet Take 1 tablet (40 mg total) by mouth at bedtime.   BREZTRI AEROSPHERE 160-9-4.8 MCG/ACT AERO Inhale 1 puff into the lungs 2 (two) times daily.   Brinzolamide-Brimonidine 1-0.2 % SUSP Place 1 drop into both eyes 3 (three) times daily.   carvedilol (COREG) 25 MG tablet TAKE 1 TABLET BY MOUTH TWICE A DAY   celecoxib (CELEBREX) 200 MG capsule Take 200 mg by mouth daily.   Cholecalciferol (VITAMIN D) 50 MCG (2000 UT) tablet Take 4,000 Units by mouth daily.    docusate sodium (COLACE) 100 MG capsule Take 100 mg by mouth daily as needed  for mild constipation or moderate constipation.   DULoxetine (CYMBALTA) 60 MG capsule Take 60 mg by mouth daily.   ENTRESTO 97-103 MG TAKE 1 TABLET BY MOUTH TWICE A DAY   insulin glargine (LANTUS) 100 UNIT/ML Solostar Pen Inject 15 Units into the skin at bedtime.   Insulin Pen Needle 32G X 4 MM MISC Use at bedtime.   latanoprost (XALATAN) 0.005 % ophthalmic solution Place 1 drop into both eyes at bedtime.   mexiletine (MEXITIL) 200 MG capsule TAKE 1 CAPSULE BY MOUTH TWICE A DAY   Multiple Vitamins-Minerals (MULTIVITAMIN WITH MINERALS) tablet Take 1 tablet by mouth daily.   Omega-3 1000 MG CAPS Take 1,000 mg by mouth 3 (three) times daily after meals.   omeprazole (PRILOSEC) 20 MG capsule Take 20 mg by mouth 2 (two) times daily.   OXcarbazepine (TRILEPTAL) 150 MG tablet Take 150 mg by mouth 2 (two) times daily.   potassium chloride SA (KLOR-CON M) 20 MEQ tablet Take 20 mEq by mouth daily.   Semaglutide, 2 MG/DOSE, (OZEMPIC, 2 MG/DOSE,) 8 MG/3ML SOPN Inject 2 mg into the skin once a week.   spironolactone (ALDACTONE) 25 MG tablet TAKE 1 TABLET (25 MG TOTAL) BY MOUTH DAILY.   timolol (TIMOPTIC-XR) 0.5 % ophthalmic gel-forming Place 1 drop into both eyes daily.   torsemide (DEMADEX) 20 MG tablet TAKE 2 TABLETS (40 MG TOTAL) BY MOUTH DAILY.     Allergies:   Empagliflozin, Methadone, Oxycodone, and Lisinopril   Social History   Socioeconomic History   Marital status: Married    Spouse name: [name]   Number of children: 0   Years of education: Not on file   Highest education level: Not on file  Occupational History   Occupation: Retired  Tobacco Use   Smoking status: Former    Current packs/day: 0.00    Types: Cigarettes    Quit date: 05/26/2019    Years since quitting: 4.2   Smokeless tobacco: Never   Tobacco comments:    smokes a pack a month  Vaping Use   Vaping status: Never Used  Substance and Sexual Activity   Alcohol use: Not Currently   Drug use: Not Currently   Sexual  activity: Not on file  Other Topics Concern   Not on file  Social History Narrative   Lives at home with wife [name] and a dog.   Social Drivers of Corporate investment banker Strain: Not on file  Food Insecurity: No Food Insecurity (04/14/2023)   Hunger Vital Sign    Worried About Running Out of Food in the Last Year: Never true    Ran Out of Food in the Last Year: Never true  Transportation Needs: No Transportation Needs (04/14/2023)   PRAPARE - Administrator, Civil Service (Medical): No  Lack of Transportation (Non-Medical): No  Physical Activity: Not on file  Stress: Not on file  Social Connections: Unknown (11/14/2021)   [facility]   Social Network    Social Network: Not on file     Family History: The patient's family history includes Alcoholism in his father; Diabetes in his mother; Drug abuse in his father; Heart disease in his brother, father, and mother; High blood pressure in his father; Hypertension in his mother; Stroke in his mother.  ROS:   Please see the history of present illness.     All other systems reviewed and are negative.  EKGs/Labs/Other Studies Reviewed:    The following studies were reviewed today:   EKG Interpretation Date/Time:  Friday August 30 2023 13:23:20 EST Ventricular Rate:  80 PR Interval:  176 QRS Duration:  116 QT Interval:  390 QTC Calculation: 449 R Axis:   -37  Text Interpretation: Normal sinus rhythm Left axis deviation Incomplete left bundle branch block Confirmed by Debbe Odea ([id number]) on 08/30/2023 1:43:13 PM    Recent Labs: 12/05/2022: B Natriuretic Peptide 82.4; TSH 0.774 01/22/2023: ALT 15 01/25/2023: Magnesium 2.4 04/14/2023: Hemoglobin 13.5; Platelets 137 06/06/2023: BUN 15; Creatinine, Ser 1.40; NT-Pro BNP 129; Potassium 3.7; Sodium 143  Recent Lipid Panel    Component Value Date/Time   CHOL 152 01/23/2023 0526   TRIG 134 01/23/2023 0526   HDL 34 (L)  01/23/2023 0526   CHOLHDL 4.5 01/23/2023 0526   VLDL 27 01/23/2023 0526   LDLCALC 91 01/23/2023 0526     Risk Assessment/Calculations:             Physical Exam:    VS:  BP 110/80 (BP Location: Left Arm, Patient Position: Sitting, Cuff Size: Large)   Pulse 80   Ht 5\' 10"  (1.778 m)   Wt 279 lb (126.6 kg)   SpO2 97%   BMI 40.03 kg/m     Wt Readings from Last 3 Encounters:  08/30/23 279 lb (126.6 kg)  08/08/23 270 lb (122.5 kg)  07/11/23 270 lb (122.5 kg)     GEN:  Well nourished, well developed in no acute distress HEENT: Normal NECK: No JVD; No carotid bruits CARDIAC: RRR, no murmurs, rubs, gallops RESPIRATORY: Diminished breath sounds at bases, no wheezing ABDOMEN: Soft, non-tender, non-distended MUSCULOSKELETAL:  No edema; chest wall tender on palpation SKIN: Warm and dry NEUROLOGIC:  Alert and oriented x 3 PSYCHIATRIC:  Normal affect   ASSESSMENT:    1. NICM (nonischemic cardiomyopathy) (HCC)   2. Coronary artery disease involving native coronary artery of native heart, unspecified whether angina present   3. Primary hypertension   4. Morbid obesity (HCC)   5. ICD (implantable cardioverter-defibrillator) in place    PLAN:    In order of problems listed above:  Nonischemic cardiomyopathy EF 30 to 35% s/p AICD 12/2022.  Possibly PVC induced.  On mexiletine.  Appears euvolemic.  Describes NYHA class III symptoms.  Continue Coreg 25 mg twice daily, Entresto 97-103 mg twice daily, Aldactone 25 mg daily.  Torsemide 40 mg daily.  Barostim device pending approval.  Patient did not tolerate Jardiance in the past, developed yeast infection. Nonobstructive CAD, denies chest pain.  Continue aspirin 81 mg, Lipitor 40 mg daily. Hypertension, BP controlled at home.  Continue Aldactone, Coreg, Entresto. Morbid obesity, fatigue.  Low-calorie diet, increase conditioning and weight loss advised.  Hopefully this will help with patient's symptoms while pending Barostim device  placement. S/p AICD, implanted 12/2022.  Keep appointment with device clinic for continued monitoring.  Follow-up in 6 months     Medication Adjustments/Labs and Tests Ordered: Current medicines are reviewed at length with the patient today.  Concerns regarding medicines are outlined above.  Orders Placed This Encounter  Procedures   EKG 12-Lead   No orders of the defined types were placed in this encounter.   Patient Instructions  Medication Instructions:  Your Physician recommend you continue on your current medication as directed.    *If you need a refill on your cardiac medications before your next appointment, please call your pharmacy*   Lab Work: None ordered.  If you have labs (blood work) drawn today and your tests are completely normal, you will receive your results only by: MyChart Message (if you have MyChart) OR A paper copy in the mail If you have any lab test that is abnormal or we need to change your treatment, we will call you to review the results.   Testing/Procedures: None ordered.    Follow-Up: At [facility], you and your health needs are our priority.  As part of our continuing mission to provide you with exceptional heart care, we have created designated Provider Care Teams.  These Care Teams include your primary Cardiologist (physician) and Advanced Practice Providers (APPs -  Physician Assistants and Nurse Practitioners) who all work together to provide you with the care you need, when you need it.  We recommend signing up for the patient portal called "MyChart".  Sign up information is provided on this After Visit Summary.  MyChart is used to connect with patients for Virtual Visits (Telemedicine).  Patients are able to view lab/test results, encounter notes, upcoming appointments, etc.  Non-urgent messages can be sent to your provider as well.   To learn more about what you can do with MyChart, go to [email or web address].    Your next  appointment:   6 month(s)  Provider:   You may see Debbe Odea, MD or one of the following Advanced Practice Providers on your designated Care Team:   Nicolasa Ducking, NP Eula Listen, PA-C Cadence Fransico Michael, PA-C Charlsie Quest, NP Carlos Levering, NP           Signed, Debbe Odea, MD  08/30/2023 3:04 PM    [facility]

## 2023-08-30 NOTE — Patient Instructions (Signed)
 Medication Instructions:  Your Physician recommend you continue on your current medication as directed.    *If you need a refill on your cardiac medications before your next appointment, please call your pharmacy*   Lab Work: None ordered  If you have labs (blood work) drawn today and your tests are completely normal, you will receive your results only by: MyChart Message (if you have MyChart) OR A paper copy in the mail If you have any lab test that is abnormal or we need to change your treatment, we will call you to review the results.   Testing/Procedures: None ordered   Follow-Up: At [facility], you and your health needs are our priority.  As part of our continuing mission to provide you with exceptional heart care, we have created designated Provider Care Teams.  These Care Teams include your primary Cardiologist (physician) and Advanced Practice Providers (APPs -  Physician Assistants and Nurse Practitioners) who all work together to provide you with the care you need, when you need it.  We recommend signing up for the patient portal called "MyChart".  Sign up information is provided on this After Visit Summary.  MyChart is used to connect with patients for Virtual Visits (Telemedicine).  Patients are able to view lab/test results, encounter notes, upcoming appointments, etc.  Non-urgent messages can be sent to your provider as well.   To learn more about what you can do with MyChart, go to [email or web address].    Your next appointment:   6 month(s)  Provider:   You may see Debbe Odea, MD or one of the following Advanced Practice Providers on your designated Care Team:   Nicolasa Ducking, NP Eula Listen, PA-C Cadence Fransico Michael, PA-C Charlsie Quest, NP Carlos Levering, NP

## 2023-08-31 ENCOUNTER — Other Ambulatory Visit (HOSPITAL_COMMUNITY): Payer: Self-pay | Admitting: Internal Medicine

## 2023-09-01 ENCOUNTER — Other Ambulatory Visit: Payer: Self-pay

## 2023-09-02 ENCOUNTER — Other Ambulatory Visit: Payer: Self-pay

## 2023-09-03 ENCOUNTER — Other Ambulatory Visit: Payer: Self-pay

## 2023-09-05 ENCOUNTER — Ambulatory Visit (INDEPENDENT_AMBULATORY_CARE_PROVIDER_SITE_OTHER): Payer: Medicare HMO | Admitting: Internal Medicine

## 2023-09-05 ENCOUNTER — Other Ambulatory Visit: Payer: Self-pay

## 2023-09-08 NOTE — Progress Notes (Signed)
 Remote ICD transmission.

## 2023-09-11 ENCOUNTER — Other Ambulatory Visit (HOSPITAL_COMMUNITY): Payer: Self-pay | Admitting: Internal Medicine

## 2023-09-12 ENCOUNTER — Ambulatory Visit (HOSPITAL_COMMUNITY): Admission: RE | Admit: 2023-09-12 | Payer: Medicare HMO | Source: Ambulatory Visit

## 2023-09-12 ENCOUNTER — Other Ambulatory Visit (HOSPITAL_COMMUNITY): Payer: Medicare HMO

## 2023-09-12 ENCOUNTER — Telehealth: Payer: Self-pay | Admitting: Internal Medicine

## 2023-09-12 NOTE — Telephone Encounter (Signed)
 Pt confirmed appt on 09/13/23

## 2023-09-13 ENCOUNTER — Encounter: Payer: Self-pay | Admitting: Internal Medicine

## 2023-09-13 ENCOUNTER — Ambulatory Visit: Payer: Medicare HMO | Attending: Internal Medicine | Admitting: Internal Medicine

## 2023-09-13 VITALS — BP 128/81 | HR 73 | Wt 280.2 lb

## 2023-09-13 DIAGNOSIS — I493 Ventricular premature depolarization: Secondary | ICD-10-CM | POA: Diagnosis not present

## 2023-09-13 DIAGNOSIS — G4733 Obstructive sleep apnea (adult) (pediatric): Secondary | ICD-10-CM

## 2023-09-13 DIAGNOSIS — R5383 Other fatigue: Secondary | ICD-10-CM | POA: Diagnosis not present

## 2023-09-13 DIAGNOSIS — R0600 Dyspnea, unspecified: Secondary | ICD-10-CM | POA: Diagnosis not present

## 2023-09-13 DIAGNOSIS — I10 Essential (primary) hypertension: Secondary | ICD-10-CM | POA: Diagnosis not present

## 2023-09-13 DIAGNOSIS — Z95810 Presence of automatic (implantable) cardiac defibrillator: Secondary | ICD-10-CM | POA: Diagnosis not present

## 2023-09-13 DIAGNOSIS — I5022 Chronic systolic (congestive) heart failure: Secondary | ICD-10-CM

## 2023-09-13 NOTE — Addendum Note (Signed)
 Addended by: Jola Schmidt A on: 09/13/2023 12:20 PM   Modules accepted: Orders

## 2023-09-13 NOTE — Patient Instructions (Addendum)
 Medication Changes:  No medication changes  Lab Work:  Go DOWN to LOWER LEVEL (LL) to have your blood work completed inside of [employer] office.  We will only call you if the results are abnormal or if the provider would like to make medication changes.   Testing/Procedures:  Please have your echo completed on Thursday, April 10th at 9:00 AM. You will check in for this at the [facility]. You have to arrive 15 MINS EARLY for preparation, otherwise you will have to reschedule.  Referrals:  We have placed a referral today to Cardiac Rehabilitation for your Heart Failure and Shortness of Breath. They should reach out to you within 1-2 weeks. If they do not, please reach out to them. Their information is below on this AVS.  Follow-Up in: 3 months with Dr. Gala Romney.  Our Doctors' schedules are NOT open yet for 3 months. We will place you on our recall list. Once they are available, we will call you to schedule your follow up appointment.   At the [facility], you and your health needs are our priority. We have a designated team specialized in the treatment of Heart Failure. This Care Team includes your primary Heart Failure Specialized Cardiologist (physician), Advanced Practice Providers (APPs- Physician Assistants and Nurse Practitioners), and Pharmacist who all work together to provide you with the care you need, when you need it.   You may see any of the following providers on your designated Care Team at your next follow up:  Dr. Arvilla Meres Dr. Marca Ancona Dr. Dorthula Nettles Dr. Theresia Bough Clarisa Kindred, FNP Enos Fling, RPH-CPP  Please be sure to bring in all your medications bottles to every appointment.   Need to Contact us:  If you have any questions or concerns before your next appointment please send [place] a message through [place] or call our office at [phone].    TO LEAVE A MESSAGE FOR THE NURSE SELECT OPTION 2, PLEASE LEAVE A MESSAGE  INCLUDING: YOUR NAME DATE OF BIRTH CALL BACK NUMBER REASON FOR CALL**this is important as we prioritize the call backs  YOU WILL RECEIVE A CALL BACK THE SAME DAY AS LONG AS YOU CALL BEFORE 4:00 PM

## 2023-09-13 NOTE — Progress Notes (Signed)
 [facility] NOTE  Referring Physician: Debbe Odea, MD  Primary Care: [facility] Primary Cardiologist: Debbe Odea, MD  HPI:  Erik Black is a 73 y.o. male with morbid obesity, HTN, OSA on CPAP, DM2 HFrEF EF 30 to 35%, diabetes, former smoker  Diagnosed with HF in 9/23. Echo 9/23 EF 30-35%. Started lasix. Referred to [facility]   Admitted 11/23 with ADHF cath as below. Treated with IV lasix and GDMT titrated. Diuresed 22 pounds.   Cath: 05/03/22  LAD 60%d LCx 55% prox RCA 30%m  RA 18 RV 68/15 PA 69/39 (41) PCWP 40  Fick 5.6/2.3   cMRI: 11/23 EF 25% mid wall LGE strip c/w NICM. RV moderately down.   Zio 11/23: Occasional NSVT 13.7% PVCs (one predominant morphology 13.6%)  Echo 3/24: EF 20-25% RV ok  Echo 7/24; EF 25-30% RV ok   Zio 6/24 Sinus. 2.1% PVCs (suppressed on mexilitene)  Underwent MDT ICD placement 01/28/23.   Admitted in 10/24 with mechanical fall. Saw Dr. Lalla Brothers in 10/24 - felt he was stable  Here for f/u. We recently referred him for Barostim but insurance didn't approve. Says he remains very fatigued. Sleeps a lot. No edema, orthopnea or PND. Says he is mor SOB and starting to use his inhaler again     FHx - Mother with HF - 52 Brothers with HF - 1 with ICD - Father with MI     Past Medical History:  Diagnosis Date   Acute on chronic combined systolic and diastolic CHF (congestive heart failure) (HCC) 05/03/2022   Acute on chronic systolic CHF (congestive heart failure) (HCC) 06/01/2022   Anxiety    Arthritis    Chest pain 01/22/2023   CHF (congestive heart failure) (HCC)    Depression    Diabetes mellitus without complication (HCC)    GERD (gastroesophageal reflux disease)    Glaucoma    both eyes   Hypertension    Hypokalemia 05/03/2022   Positive D dimer 06/01/2022   Sleep apnea    uses Cpap nightly    Current Outpatient Medications  Medication Sig Dispense Refill   acetaminophen (TYLENOL) 500 MG  tablet Take 1,000 mg by mouth as needed for moderate pain (pain score 4-6).     aspirin EC 81 MG tablet Take 1 tablet (81 mg total) by mouth 2 (two) times daily for 6 weeks, then as directed by provider. 120 tablet 0   atorvastatin (LIPITOR) 40 MG tablet Take 1 tablet (40 mg total) by mouth at bedtime. 90 tablet 3   BREZTRI AEROSPHERE 160-9-4.8 MCG/ACT AERO Inhale 1 puff into the lungs 2 (two) times daily.     Brinzolamide-Brimonidine 1-0.2 % SUSP Place 1 drop into both eyes 3 (three) times daily.     carvedilol (COREG) 25 MG tablet TAKE 1 TABLET BY MOUTH TWICE A DAY 180 tablet 3   celecoxib (CELEBREX) 200 MG capsule Take 200 mg by mouth daily.     Cholecalciferol (VITAMIN D) 50 MCG (2000 UT) tablet Take 4,000 Units by mouth daily.      docusate sodium (COLACE) 100 MG capsule Take 100 mg by mouth daily as needed for mild constipation or moderate constipation.     DULoxetine (CYMBALTA) 60 MG capsule Take 60 mg by mouth daily.     ENTRESTO 97-103 MG TAKE 1 TABLET BY MOUTH TWICE A DAY 60 tablet 6   insulin glargine (LANTUS) 100 UNIT/ML Solostar Pen Inject 15 Units into the skin at bedtime. 15 mL 1  Insulin Pen Needle 32G X 4 MM MISC Use at bedtime. 100 each 1   latanoprost (XALATAN) 0.005 % ophthalmic solution Place 1 drop into both eyes at bedtime.     mexiletine (MEXITIL) 200 MG capsule TAKE 1 CAPSULE BY MOUTH TWICE A DAY 180 capsule 1   Multiple Vitamins-Minerals (MULTIVITAMIN WITH MINERALS) tablet Take 1 tablet by mouth daily.     Omega-3 1000 MG CAPS Take 1,000 mg by mouth 3 (three) times daily after meals.     omeprazole (PRILOSEC) 20 MG capsule Take 20 mg by mouth 2 (two) times daily.     OXcarbazepine (TRILEPTAL) 150 MG tablet Take 150 mg by mouth 2 (two) times daily.     potassium chloride SA (KLOR-CON M) 20 MEQ tablet Take 20 mEq by mouth daily.     Semaglutide, 2 MG/DOSE, (OZEMPIC, 2 MG/DOSE,) 8 MG/3ML SOPN Inject 2 mg into the skin once a week. 3 mL 11   spironolactone (ALDACTONE) 25  MG tablet TAKE 1 TABLET (25 MG TOTAL) BY MOUTH DAILY. 90 tablet 2   timolol (TIMOPTIC-XR) 0.5 % ophthalmic gel-forming Place 1 drop into both eyes daily.     torsemide (DEMADEX) 20 MG tablet TAKE 2 TABLETS (40 MG TOTAL) BY MOUTH DAILY. 180 tablet 1   No current facility-administered medications for this visit.    Allergies  Allergen Reactions   Empagliflozin Rash and Other (See Comments)    Hypersensitivity reaction   Methadone Itching   Oxycodone Itching    Hands started peeling and "could not swallow"   Lisinopril Cough      Social History   Socioeconomic History   Marital status: Married    Spouse name: [name]   Number of children: 0   Years of education: Not on file   Highest education level: Not on file  Occupational History   Occupation: Retired  Tobacco Use   Smoking status: Former    Current packs/day: 0.00    Types: Cigarettes    Quit date: 05/26/2019    Years since quitting: 4.3   Smokeless tobacco: Never   Tobacco comments:    smokes a pack a month  Vaping Use   Vaping status: Never Used  Substance and Sexual Activity   Alcohol use: Not Currently   Drug use: Not Currently   Sexual activity: Not on file  Other Topics Concern   Not on file  Social History Narrative   Lives at home with wife [name] and a dog.   Social Drivers of Corporate investment banker Strain: Not on file  Food Insecurity: No Food Insecurity (04/14/2023)   Hunger Vital Sign    Worried About Running Out of Food in the Last Year: Never true    Ran Out of Food in the Last Year: Never true  Transportation Needs: No Transportation Needs (04/14/2023)   PRAPARE - Administrator, Civil Service (Medical): No    Lack of Transportation (Non-Medical): No  Physical Activity: Not on file  Stress: Not on file  Social Connections: Unknown (11/14/2021)   Received from [facility]   Social Network    Social Network: Not on file  Intimate Partner Violence: Not At Risk  (04/14/2023)   Humiliation, Afraid, Rape, and Kick questionnaire    Fear of Current or Ex-Partner: No    Emotionally Abused: No    Physically Abused: No    Sexually Abused: No      Family History  Problem Relation Age of  Onset   Hypertension Mother    Diabetes Mother    Heart disease Mother    Stroke Mother    Heart disease Father    High blood pressure Father    Alcoholism Father    Drug abuse Father    Heart disease Brother     Vitals:   09/13/23 1111  BP: 128/81  Pulse: 73  SpO2: 99%  Weight: 280 lb 3.2 oz (127.1 kg)    Wt Readings from Last 3 Encounters:  09/13/23 280 lb 3.2 oz (127.1 kg)  08/30/23 279 lb (126.6 kg)  08/08/23 270 lb (122.5 kg)     PHYSICAL EXAM: General:  Well appearing. No resp difficulty HEENT: normal Neck: supple. no JVD. Carotids 2+ bilat; no bruits. No lymphadenopathy or thryomegaly appreciated. Cor: PMI nondisplaced. Regular rate & rhythm. No rubs, gallops or murmurs. Lungs: clear Abdomen: obese soft, nontender, nondistended. No hepatosplenomegaly. No bruits or masses. Good bowel sounds. Extremities: no cyanosis, clubbing, rash, edema Neuro: alert & orientedx3, cranial nerves grossly intact. moves all 4 extremities w/o difficulty. Affect pleasant  ICD interrogated: No VT/AF. Fluid level ok. BiV pacing > 99% Activity level 2.5hr.day Personally reviewed   ASSESSMENT & PLAN:   1. Chronic systolic HF - Echocardiogram 03/2022 EF 30 to 35% - Cath 11/23 non-obstructive CAD - Admit 11/23 with marked volume overload.  - cMRI: 11/23 EF 25% mid wall LGE strip c/w NICM. RV moderately down.  - Zio 11/23: Occasional NSVT 13.7% PVCs (one predominant morphology 13.6%) - Echo 3/24: EF 20-25% RV ok  - Echo 7/24; EF 25-30% RV ok  - Underwent MDT ICD placement 01/28/23 - Suspect PVC CM vs familial (LMNA)  No evidence of infiltrative process on MRI  - EF has not improved with PVC suppression with mexilitene. He has met with Dr. Jomarie Longs in Genetics who  agrees that Fhx is concerning for potential genetic CM however he has no first-degree relatives that would benefit from screening so has decided not to pursue - Worse today complaining of NYHA III-IIIB symptoms but objective findings don't correlate - Volume status looks good on exam and Optivol. Continue torsemide 40 daily. NT-pro BNP recently 129 - ICD interrogated as above - Continue Entresto to 97/103 bid - Continue carvedilol 25 bid - Continue spiro 25 daily - Patient did not tolerate Jardiance in the past, developed yeast infection - We recently referred him for Barostim but insurance didn't approve. - Will repeat echo and labs  - Refer to cardiac rehab to see if we can get him more active  2. Frequent PVCs - Zio 11/23: Occasional NSVT 13.7% PVCs (one predominant morphology 13.6%) - Continue mexilitene 200 bid for suppression  - Zio 6/24 Sinus. 2.1% PVCs (suppressed on mexilitene) - No change  3. HTN - Blood pressure well controlled. Continue current regimen.  4. OSA - compliant with CPAP - Downloads look good  5. Morbid obesity  - continue GLP-1 RA - Body mass index is 40.2 kg/m.  Arvilla Meres, MD  11:49 AM

## 2023-09-14 LAB — BASIC METABOLIC PANEL
BUN/Creatinine Ratio: 13 (ref 10–24)
BUN: 21 mg/dL (ref 8–27)
CO2: 24 mmol/L (ref 20–29)
Calcium: 9.7 mg/dL (ref 8.6–10.2)
Chloride: 103 mmol/L (ref 96–106)
Creatinine, Ser: 1.68 mg/dL — ABNORMAL HIGH (ref 0.76–1.27)
Glucose: 96 mg/dL (ref 70–99)
Potassium: 4.1 mmol/L (ref 3.5–5.2)
Sodium: 141 mmol/L (ref 134–144)
eGFR: 43 mL/min/{1.73_m2} — ABNORMAL LOW (ref >59)

## 2023-09-14 LAB — TSH: TSH: 0.873 u[IU]/mL (ref 0.450–4.500)

## 2023-09-14 LAB — BRAIN NATRIURETIC PEPTIDE: BNP: 48.7 pg/mL (ref 0.0–100.0)

## 2023-09-14 LAB — T4, FREE: Free T4: 1.01 ng/dL (ref 0.82–1.77)

## 2023-09-14 LAB — T3, FREE: T3, Free: 3.3 pg/mL (ref 2.0–4.4)

## 2023-09-20 ENCOUNTER — Other Ambulatory Visit: Payer: Self-pay | Admitting: Student

## 2023-09-20 ENCOUNTER — Other Ambulatory Visit: Payer: Self-pay

## 2023-09-20 ENCOUNTER — Telehealth: Payer: Self-pay

## 2023-09-20 NOTE — Telephone Encounter (Signed)
*  STAT* If patient is at the pharmacy, call can be transferred to refill team.   1. Which medications need to be refilled? (please list name of each medication and dose if known) Lantus   2. Would you like to learn more about the convenience, safety, & potential cost savings by using the [facility]?      3. Are you open to using the [facility] (Type [facility]   4. Which pharmacy/location (including street and city if local pharmacy) is medication to be sent to? [facility] Pt RX at [facility]   5. Do they need a 30 day or 90 day supply?  Please call today(out of medicine)

## 2023-09-20 NOTE — Telephone Encounter (Signed)
 Pt called questioning where he would get his lantus filled.  Advised pt to follow up with his p[facility] ([facility]) in order to get further prescription of lantus. Pt verbalized understanding.

## 2023-09-24 ENCOUNTER — Encounter (HOSPITAL_COMMUNITY): Payer: Self-pay

## 2023-09-24 ENCOUNTER — Ambulatory Visit (HOSPITAL_COMMUNITY): Payer: Medicare HMO

## 2023-09-24 ENCOUNTER — Ambulatory Visit (HOSPITAL_COMMUNITY): Admission: RE | Admit: 2023-09-24 | Payer: Medicare HMO | Source: Ambulatory Visit

## 2023-09-26 ENCOUNTER — Other Ambulatory Visit: Payer: Self-pay | Admitting: Internal Medicine

## 2023-09-26 DIAGNOSIS — I5022 Chronic systolic (congestive) heart failure: Secondary | ICD-10-CM

## 2023-10-08 ENCOUNTER — Encounter (INDEPENDENT_AMBULATORY_CARE_PROVIDER_SITE_OTHER): Payer: Self-pay | Admitting: Internal Medicine

## 2023-10-08 ENCOUNTER — Ambulatory Visit (INDEPENDENT_AMBULATORY_CARE_PROVIDER_SITE_OTHER): Admitting: Internal Medicine

## 2023-10-08 VITALS — BP 116/78 | HR 78 | Temp 97.8°F | Ht 69.0 in | Wt 272.0 lb

## 2023-10-08 DIAGNOSIS — I5020 Unspecified systolic (congestive) heart failure: Secondary | ICD-10-CM

## 2023-10-08 DIAGNOSIS — E669 Obesity, unspecified: Secondary | ICD-10-CM | POA: Diagnosis not present

## 2023-10-08 DIAGNOSIS — G4733 Obstructive sleep apnea (adult) (pediatric): Secondary | ICD-10-CM | POA: Diagnosis not present

## 2023-10-08 DIAGNOSIS — Z6841 Body Mass Index (BMI) 40.0 and over, adult: Secondary | ICD-10-CM | POA: Diagnosis not present

## 2023-10-08 DIAGNOSIS — N1831 Chronic kidney disease, stage 3a: Secondary | ICD-10-CM

## 2023-10-08 DIAGNOSIS — E1122 Type 2 diabetes mellitus with diabetic chronic kidney disease: Secondary | ICD-10-CM | POA: Diagnosis not present

## 2023-10-08 DIAGNOSIS — Z7985 Long-term (current) use of injectable non-insulin antidiabetic drugs: Secondary | ICD-10-CM

## 2023-10-08 DIAGNOSIS — I130 Hypertensive heart and chronic kidney disease with heart failure and stage 1 through stage 4 chronic kidney disease, or unspecified chronic kidney disease: Secondary | ICD-10-CM

## 2023-10-08 DIAGNOSIS — N179 Acute kidney failure, unspecified: Secondary | ICD-10-CM | POA: Insufficient documentation

## 2023-10-08 DIAGNOSIS — I10 Essential (primary) hypertension: Secondary | ICD-10-CM

## 2023-10-08 DIAGNOSIS — Z794 Long term (current) use of insulin: Secondary | ICD-10-CM

## 2023-10-08 NOTE — Assessment & Plan Note (Signed)
 Concern for chronic kidney disease indicated by a decrease in kidney function from a GFR of 54 to 43. This decline may be related to diuretics, which could stress the kidneys, especially since he is no longer on insulin, which previously contributed to fluid retention. The reduction in insulin has decreased fluid retention, necessitating a reassessment of diuretic use to prevent dehydration and further kidney stress. - Order blood tests to assess kidney function, including urea nitrogen and creatinine. - Check fractional excretion of urea - Communicate with cardiologist regarding potential adjustment of diuretic dosage.

## 2023-10-08 NOTE — Progress Notes (Signed)
 Office: [phone]  /  Fax: [phone]  Weight Summary And Biometrics  Vitals Temp: 97.8 F (36.6 C) BP: 116/78 Pulse Rate: 78 SpO2: 98 %   Anthropometric Measurements Height: 5\' 9"  (1.753 m) Weight: 272 lb (123.4 kg) BMI (Calculated): 40.15 Weight at Last Visit: 270 lb Weight Lost Since Last Visit: 0 lb Weight Gained Since Last Visit: 2 lb Starting Weight: 271 lb Total Weight Loss (lbs): 0 lb (0 kg) Peak Weight: 325 lb   Body Composition  Body Fat %: 40.8 % Fat Mass (lbs): 111.4 lbs Muscle Mass (lbs): 153.4 lbs Total Body Water (lbs): 112.6 lbs Visceral Fat Rating : 28    No data recorded Today's Visit #: 5  Starting Date: 05/22/23   Subjective   Chief Complaint: Obesity  Interval History Discussed the use of AI scribe software for clinical note transcription with the patient, who gave verbal consent to proceed.  History of Present Illness Erik Black is a 73 year old male with diabetes and heart failure who presents for follow-up on weight management.  He is accompanied by his wife.  He has experienced significant improvement in his diabetes management, with his A1c decreasing from over 10 to 6.6. This improvement led to the discontinuation of insulin by his [place] doctor. He attributes this success to dietary changes, specifically reducing carbohydrate intake and increasing fruit consumption. He is currently on Ozempic at a dose of 1 mg, administered as 36 clicks, and notes that his appetite is gradually returning.  Regarding his heart condition, he experiences fatigue and has a defibrillator in place. He mentions that his heart beats slowly at times, which affects his ability to complete tasks on his 'Fred do list'. On good days, he feels well enough to attend church regularly, whereas previously he would often stay in bed. No swelling in his legs is noted, and he is on a diuretic regimen that includes torsemide and spironolactone.  We noticed the  drop in kidney function from 54 to 43 in March, which has not led to any medication adjustments yet.  His leg edema has improved  His physical activity is improving, and he engages in projects such as working on a [employer]. He has maintained his weight and is considering intermittent fasting. He aims for 90 grams of protein daily, distributed across three meals, and uses a protein supplement to help meet this goal.    Challenges affecting patient progress: medical comorbidities.    Pharmacotherapy for weight management: He is currently taking Ozempic with diabetes as the primary indication with adequate clinical response , without side effects., and medication was reduced to 1 mg several months ago due to restrictive eating .   Assessment and Plan   Treatment Plan For Obesity:  Recommended Dietary Goals  Erik Black is currently in the action stage of change. As such, his goal is to continue weight management plan. He has agreed to: continue current plan,   Behavioral Health and Counseling  We discussed the following behavioral modification strategies today: increasing lean protein intake to established goals, decreasing simple carbohydrates , increasing fiber rich foods, avoiding skipping meals, and increasing water intake .  Additional education and resources provided today: None  Recommended Physical Activity Goals  Erik Black has been advised to work up to 150 minutes of moderate intensity aerobic activity a week and strengthening exercises 2-3 times per week for cardiovascular health, weight loss maintenance and preservation of muscle mass.   He has agreed to :  Think about enjoyable  ways to increase daily physical activity and overcoming barriers to exercise and Increase physical activity in their day and reduce sedentary time (increase NEAT).  Pharmacotherapy  We discussed various medication options to help Erik Black with his weight loss efforts and we both agreed to : adequate  clinical response to current dose, continue current regimen and do not increase GLP-1 at present time due to a history of restrictive eating pattern  Associated Conditions Impacted by Obesity Treatment  Primary hypertension Assessment & Plan: Blood pressure is well-controlled.  He is currently on spironolactone, Entresto and torsemide and potassium supplementation.  Review of recent labs showed a decrease in GFR from 50s to 40s but no electrolyte derangements.  Patient is at risk for acute kidney injury.  His edema has also improved secondary to elimination of insulin which she had been on basal and fast acting insulin when he started a weight management program.  He no longer requires insulin.  I will check BMP, magnesium as well as urine urea and creatinine to check a fractional excretion of urea.  I will circle back with cardiology after test results.  He may need adjustment of his loop diuretic   OSA on CPAP Assessment & Plan: On CPAP with reported good compliance. Continue PAP therapy. Losing 15% or more of body weight may improve AHI.      Type 2 diabetes mellitus with stage 3a chronic kidney disease, with long-term current use of insulin (HCC) Assessment & Plan: Type 2 Diabetes Mellitus has significantly improved with lifestyle modifications, particularly dietary changes. HbA1c decreased from 10% to 6.6%, allowing discontinuation of insulin.  The improvement is attributed to reduced carbohydrate intake and increased fruit consumption, positively impacting pancreatic function. Currently on Ozempic at a reduced dose of 1 mg, which helps manage appetite and maintain weight. The reduction in insulin has also decreased fluid retention, positively impacting overall health. - Continue Ozempic at 1 mg dose. - Encourage continued dietary modifications with focus on fruits and vegetables. - Monitor HbA1c levels regularly.   Acute kidney injury [facility]) Assessment & Plan: Concern for chronic  kidney disease indicated by a decrease in kidney function from a GFR of 54 to 43. This decline may be related to diuretics, which could stress the kidneys, especially since he is no longer on insulin, which previously contributed to fluid retention. The reduction in insulin has decreased fluid retention, necessitating a reassessment of diuretic use to prevent dehydration and further kidney stress. - Order blood tests to assess kidney function, including urea nitrogen and creatinine. - Check fractional excretion of urea - Communicate with cardiologist regarding potential adjustment of diuretic dosage.  Orders: -     [id number]+EGFR -     Magnesium -     Urea nitrogen, urine -     Creatinine, urine, random  HFrEF (heart failure with reduced ejection fraction) (HCC) Assessment & Plan: Heart failure contributes to fatigue. Medications include Entresto, mexiletine, spironolactone and torsemide.  He has a defibrillator.  His psych he is being considered for pacemaker therapy.  Fatigue may be exacerbated by heart failure medications affecting heart rate and fluid balance. An upcoming echocardiogram will assess heart function and guide further treatment decisions.  His edema has improved after discontinuation of insulin.  He is also on diuretic therapy - Follow-up with cardiology as scheduled - Continue guideline based medical therapy - Check renal function and electrolytes today and circle back to cardiology for possible adjustments in diuretic therapy due to an acute decline in GFR.  Assessment and Plan Assessment & Plan Type 2 Diabetes Mellitus   Heart Failure   Chronic Kidney Disease   General Health Maintenance Encouraged to maintain a healthy lifestyle with a focus on diet and physical activity. Engaging in physical activities and projects is beneficial for overall health. Intermittent fasting and adequate protein intake are recommended to support metabolic rate and weight management. -  Encourage continued physical activity and engagement in projects. - Advise on intermittent fasting and protein intake of 90 grams per day. - Monitor weight and encourage weight maintenance or loss as appropriate.  Follow-up Follow-up includes monitoring of lab results and communication with specialists as needed. Blood work will assess kidney function and other relevant parameters to guide treatment adjustments. - Perform blood work today to assess kidney function and other relevant parameters. - Follow up with cardiologist regarding heart function and potential device implantation. - Communicate lab results through patient portal or phone call if critical.      Objective   Physical Exam:  Blood pressure 116/78, pulse 78, temperature 97.8 F (36.6 C), height 5\' 9"  (1.753 m), weight 272 lb (123.4 kg), SpO2 98%. Body mass index is 40.17 kg/m.  General: He is overweight, cooperative, alert, well developed, and in no acute distress. PSYCH: Has normal mood, affect and thought process.   HEENT: EOMI, sclerae are anicteric. Lungs: Normal breathing effort, no conversational dyspnea. Extremities: No edema.  Neurologic: No gross sensory or motor deficits. No tremors or fasciculations noted.    Diagnostic Data Reviewed:  BMET    Component Value Date/Time   NA 141 09/13/2023 1228   K 4.1 09/13/2023 1228   CL 103 09/13/2023 1228   CO2 24 09/13/2023 1228   GLUCOSE 96 09/13/2023 1228   GLUCOSE 115 (H) 04/14/2023 1421   BUN 21 09/13/2023 1228   CREATININE 1.68 (H) 09/13/2023 1228   CALCIUM 9.7 09/13/2023 1228   GFRNONAA 41 (L) 04/14/2023 1421   GFRAA 59 (L) 11/24/2019 0628   Lab Results  Component Value Date   HGBA1C 7.2 (H) 05/22/2023   HGBA1C 6.1 (H) 04/16/2019   No results found for: "INSULIN" Lab Results  Component Value Date   TSH 0.873 09/13/2023   CBC    Component Value Date/Time   WBC 6.6 04/14/2023 1421   RBC 4.64 04/14/2023 1421   HGB 13.5 04/14/2023 1421   HCT  40.6 04/14/2023 1421   PLT 137 (L) 04/14/2023 1421   MCV 87.5 04/14/2023 1421   MCH 29.1 04/14/2023 1421   MCHC 33.3 04/14/2023 1421   RDW 13.0 04/14/2023 1421   Iron Studies No results found for: "IRON", "TIBC", "FERRITIN", "IRONPCTSAT" Lipid Panel     Component Value Date/Time   CHOL 152 01/23/2023 0526   TRIG 134 01/23/2023 0526   HDL 34 (L) 01/23/2023 0526   CHOLHDL 4.5 01/23/2023 0526   VLDL 27 01/23/2023 0526   LDLCALC 91 01/23/2023 0526   Hepatic Function Panel     Component Value Date/Time   PROT 7.2 01/22/2023 0935   ALBUMIN 4.1 01/22/2023 0935   AST 20 01/22/2023 0935   ALT 15 01/22/2023 0935   ALKPHOS 120 01/22/2023 0935   BILITOT 0.8 01/22/2023 0935      Component Value Date/Time   TSH 0.873 09/13/2023 1228   Nutritional Lab Results  Component Value Date   VD25OH 49.3 05/22/2023    Medications: Outpatient Encounter Medications as of 10/08/2023  Medication Sig Note   acetaminophen (TYLENOL) 500 MG tablet Take 1,000 mg by  mouth as needed for moderate pain (pain score 4-6). 04/15/2023: Pt and Wife are unsure of last dose.    aspirin EC 81 MG tablet Take 1 tablet (81 mg total) by mouth 2 (two) times daily for 6 weeks, then as directed by provider.    atorvastatin (LIPITOR) 40 MG tablet Take 1 tablet (40 mg total) by mouth at bedtime.    BREZTRI AEROSPHERE 160-9-4.8 MCG/ACT AERO Inhale 1 puff into the lungs 2 (two) times daily.    Brinzolamide-Brimonidine 1-0.2 % SUSP Place 1 drop into both eyes 3 (three) times daily.    carvedilol (COREG) 25 MG tablet TAKE 1 TABLET BY MOUTH TWICE A DAY    celecoxib (CELEBREX) 200 MG capsule Take 200 mg by mouth daily.    Cholecalciferol (VITAMIN D) 50 MCG (2000 UT) tablet Take 4,000 Units by mouth daily.     docusate sodium (COLACE) 100 MG capsule Take 100 mg by mouth daily as needed for mild constipation or moderate constipation.    DULoxetine (CYMBALTA) 60 MG capsule Take 60 mg by mouth daily.    ENTRESTO 97-103 MG TAKE 1  TABLET BY MOUTH TWICE A DAY    Insulin Pen Needle 32G X 4 MM MISC Use at bedtime.    latanoprost (XALATAN) 0.005 % ophthalmic solution Place 1 drop into both eyes at bedtime.    mexiletine (MEXITIL) 200 MG capsule TAKE 1 CAPSULE BY MOUTH TWICE A DAY    Multiple Vitamins-Minerals (MULTIVITAMIN WITH MINERALS) tablet Take 1 tablet by mouth daily.    Omega-3 1000 MG CAPS Take 1,000 mg by mouth 3 (three) times daily after meals.    omeprazole (PRILOSEC) 20 MG capsule Take 20 mg by mouth 2 (two) times daily.    OXcarbazepine (TRILEPTAL) 150 MG tablet Take 150 mg by mouth 2 (two) times daily.    potassium chloride SA (KLOR-CON M) 20 MEQ tablet Take 20 mEq by mouth daily.    Semaglutide, 2 MG/DOSE, (OZEMPIC, 2 MG/DOSE,) 8 MG/3ML SOPN Inject 2 mg into the skin once a week.    spironolactone (ALDACTONE) 25 MG tablet TAKE 1 TABLET (25 MG TOTAL) BY MOUTH DAILY.    timolol (TIMOPTIC-XR) 0.5 % ophthalmic gel-forming Place 1 drop into both eyes daily.    torsemide (DEMADEX) 20 MG tablet TAKE 2 TABLETS (40 MG TOTAL) BY MOUTH DAILY.    [DISCONTINUED] insulin glargine (LANTUS) 100 UNIT/ML Solostar Pen Inject 15 Units into the skin at bedtime.    No facility-administered encounter medications on file as of 10/08/2023.     Follow-Up   Return in about 4 weeks (around 11/05/2023) for For Weight Mangement with Dr. Rikki Spearing - 40 minutes- complex obesity.Marland Kitchen He was informed of the importance of frequent follow up visits to maximize his success with intensive lifestyle modifications for his multiple health conditions.  Attestation Statement   Reviewed by clinician on day of visit: allergies, medications, problem list, medical history, surgical history, family history, social history, and previous encounter notes.     Worthy Rancher, MD

## 2023-10-08 NOTE — Assessment & Plan Note (Signed)
 Heart failure contributes to fatigue. Medications include Entresto, mexiletine, spironolactone and torsemide.  He has a defibrillator.  His psych he is being considered for pacemaker therapy.  Fatigue may be exacerbated by heart failure medications affecting heart rate and fluid balance. An upcoming echocardiogram will assess heart function and guide further treatment decisions.  His edema has improved after discontinuation of insulin.  He is also on diuretic therapy - Follow-up with cardiology as scheduled - Continue guideline based medical therapy - Check renal function and electrolytes today and circle back to cardiology for possible adjustments in diuretic therapy due to an acute decline in GFR.

## 2023-10-08 NOTE — Assessment & Plan Note (Signed)
 On CPAP with reported good compliance. Continue PAP therapy. Losing 15% or more of body weight may improve AHI.

## 2023-10-08 NOTE — Assessment & Plan Note (Signed)
 Blood pressure is well-controlled.  He is currently on spironolactone, Entresto and torsemide and potassium supplementation.  Review of recent labs showed a decrease in GFR from 50s to 40s but no electrolyte derangements.  Patient is at risk for acute kidney injury.  His edema has also improved secondary to elimination of insulin which she had been on basal and fast acting insulin when he started a weight management program.  He no longer requires insulin.  I will check BMP, magnesium as well as urine urea and creatinine to check a fractional excretion of urea.  I will circle back with cardiology after test results.  He may need adjustment of his loop diuretic

## 2023-10-08 NOTE — Assessment & Plan Note (Signed)
 Type 2 Diabetes Mellitus has significantly improved with lifestyle modifications, particularly dietary changes. HbA1c decreased from 10% to 6.6%, allowing discontinuation of insulin.  The improvement is attributed to reduced carbohydrate intake and increased fruit consumption, positively impacting pancreatic function. Currently on Ozempic at a reduced dose of 1 mg, which helps manage appetite and maintain weight. The reduction in insulin has also decreased fluid retention, positively impacting overall health. - Continue Ozempic at 1 mg dose. - Encourage continued dietary modifications with focus on fruits and vegetables. - Monitor HbA1c levels regularly.

## 2023-10-09 LAB — CMP14+EGFR
ALT: 14 IU/L (ref 0–44)
AST: 14 IU/L (ref 0–40)
Albumin: 4.3 g/dL (ref 3.8–4.8)
Alkaline Phosphatase: 151 IU/L — ABNORMAL HIGH (ref 44–121)
BUN/Creatinine Ratio: 10 (ref 10–24)
BUN: 15 mg/dL (ref 8–27)
Bilirubin Total: 0.5 mg/dL (ref 0.0–1.2)
CO2: 22 mmol/L (ref 20–29)
Calcium: 9.7 mg/dL (ref 8.6–10.2)
Chloride: 102 mmol/L (ref 96–106)
Creatinine, Ser: 1.5 mg/dL — ABNORMAL HIGH (ref 0.76–1.27)
Globulin, Total: 2.5 g/dL (ref 1.5–4.5)
Glucose: 99 mg/dL (ref 70–99)
Potassium: 3.8 mmol/L (ref 3.5–5.2)
Sodium: 143 mmol/L (ref 134–144)
Total Protein: 6.8 g/dL (ref 6.0–8.5)
eGFR: 49 mL/min/{1.73_m2} — ABNORMAL LOW (ref >59)

## 2023-10-09 LAB — UREA NITROGEN, URINE: Urea Nitrogen, Ur: 286 mg/dL

## 2023-10-09 LAB — MAGNESIUM: Magnesium: 2.1 mg/dL (ref 1.6–2.3)

## 2023-10-09 LAB — CREATININE, URINE, RANDOM: Creatinine, Urine: 64.1 mg/dL

## 2023-10-10 ENCOUNTER — Ambulatory Visit
Admission: RE | Admit: 2023-10-10 | Discharge: 2023-10-10 | Disposition: A | Discharge status: Home / Self Care | Source: Ambulatory Visit | Attending: Internal Medicine | Admitting: Internal Medicine

## 2023-10-10 ENCOUNTER — Encounter (INDEPENDENT_AMBULATORY_CARE_PROVIDER_SITE_OTHER): Payer: Self-pay | Admitting: Internal Medicine

## 2023-10-10 DIAGNOSIS — Z95810 Presence of automatic (implantable) cardiac defibrillator: Secondary | ICD-10-CM | POA: Insufficient documentation

## 2023-10-10 DIAGNOSIS — I358 Other nonrheumatic aortic valve disorders: Secondary | ICD-10-CM | POA: Insufficient documentation

## 2023-10-10 DIAGNOSIS — I493 Ventricular premature depolarization: Secondary | ICD-10-CM | POA: Diagnosis not present

## 2023-10-10 DIAGNOSIS — I5022 Chronic systolic (congestive) heart failure: Secondary | ICD-10-CM | POA: Diagnosis not present

## 2023-10-10 DIAGNOSIS — G4733 Obstructive sleep apnea (adult) (pediatric): Secondary | ICD-10-CM | POA: Insufficient documentation

## 2023-10-10 LAB — ECHOCARDIOGRAM COMPLETE: S' Lateral: 4.7 cm

## 2023-10-10 NOTE — Progress Notes (Signed)
*  PRELIMINARY RESULTS* Echocardiogram 2D Echocardiogram has been performed.  Erik Black 10/10/2023, 9:36 AM

## 2023-10-21 ENCOUNTER — Telehealth (HOSPITAL_COMMUNITY): Payer: Self-pay | Admitting: *Deleted

## 2023-10-21 NOTE — Telephone Encounter (Signed)
 Pt called concerned that echo is the same, ef still down and ins will not approve barostim, would like to know what else can be done.  Advised pt that another [employer] has been submitted on 10/07/23 per rep and it can take up to 30 days for a response, advised will keep him updated   Pt aware, agreeable, and thankful

## 2023-10-29 ENCOUNTER — Ambulatory Visit (INDEPENDENT_AMBULATORY_CARE_PROVIDER_SITE_OTHER): Payer: Medicare HMO

## 2023-10-29 DIAGNOSIS — I428 Other cardiomyopathies: Secondary | ICD-10-CM | POA: Diagnosis not present

## 2023-10-29 LAB — CUP PACEART REMOTE DEVICE CHECK
Battery Remaining Longevity: 162 mo
Battery Voltage: 3.03 V
Brady Statistic RA Percent Paced: INVALID
Brady Statistic RV Percent Paced: 0 %
Date Time Interrogation Session: 20250429031134
HighPow Impedance: 69 Ohm
Implantable Lead Connection Status: 753985
Implantable Lead Implant Date: 20240729
Implantable Lead Location: 753860
Implantable Pulse Generator Implant Date: 20240729
Lead Channel Impedance Value: 627 Ohm
Lead Channel Impedance Value: 684 Ohm
Lead Channel Pacing Threshold Amplitude: 0.375 V
Lead Channel Pacing Threshold Pulse Width: 0.4 ms
Lead Channel Sensing Intrinsic Amplitude: 6.4 mV
Lead Channel Setting Pacing Amplitude: 1.5 V
Lead Channel Setting Pacing Pulse Width: 0.4 ms
Lead Channel Setting Sensing Sensitivity: 0.3 mV
Zone Setting Status: 755011

## 2023-11-01 ENCOUNTER — Encounter: Payer: Self-pay | Admitting: Cardiology

## 2023-11-08 ENCOUNTER — Other Ambulatory Visit: Payer: Self-pay | Admitting: Internal Medicine

## 2023-11-19 ENCOUNTER — Ambulatory Visit (HOSPITAL_COMMUNITY)
Admission: RE | Admit: 2023-11-19 | Discharge: 2023-11-19 | Disposition: A | Discharge status: Home / Self Care | Source: Ambulatory Visit | Attending: Orthopedic Surgery | Admitting: Orthopedic Surgery

## 2023-11-19 DIAGNOSIS — Z9889 Other specified postprocedural states: Secondary | ICD-10-CM | POA: Diagnosis not present

## 2023-11-19 DIAGNOSIS — S46111A Strain of muscle, fascia and tendon of long head of biceps, right arm, initial encounter: Secondary | ICD-10-CM | POA: Diagnosis not present

## 2023-11-19 DIAGNOSIS — M25511 Pain in right shoulder: Secondary | ICD-10-CM | POA: Diagnosis present

## 2023-11-19 DIAGNOSIS — X58XXXA Exposure to other specified factors, initial encounter: Secondary | ICD-10-CM | POA: Insufficient documentation

## 2023-11-19 DIAGNOSIS — S43431A Superior glenoid labrum lesion of right shoulder, initial encounter: Secondary | ICD-10-CM | POA: Diagnosis not present

## 2023-11-19 MED ORDER — GADOBUTROL 1 MMOL/ML IV SOLN
2.0000 mL/kg | Freq: Once | INTRAVENOUS | Status: AC | PRN
Start: 2023-11-19 — End: 2023-11-19
  Administered 2023-11-19: 0.05 mL

## 2023-11-19 MED ORDER — IOHEXOL 180 MG/ML  SOLN
10.0000 mL | Freq: Once | INTRAMUSCULAR | Status: AC | PRN
  Administered 2023-11-19: 10 mL via INTRA_ARTICULAR

## 2023-11-19 MED ORDER — SODIUM CHLORIDE (PF) 0.9% IJ SOLUTION - NO CHARGE
10.0000 mL | Freq: Once | INTRAMUSCULAR | Status: AC
  Administered 2023-11-19: 10 mL
  Filled 2023-11-19: qty 10

## 2023-11-19 MED ORDER — LIDOCAINE HCL (PF) 1 % IJ SOLN
5.0000 mL | Freq: Once | INTRAMUSCULAR | Status: AC
  Administered 2023-11-19: 5 mL

## 2023-11-19 NOTE — CV Procedure (Signed)
  Device system confirmed to be MRI conditional, with implant date > 6 weeks ago, and no evidence of abandoned or epicardial leads in review of most recent CXR  Device last cleared by EP Provider: Valeri Gate, PA on 11/14/2023   Clearance is good through for 1 year as long as parameters remain stable at time of check. If pt undergoes a cardiac device procedure during that time, they should be re-cleared.   Tachy-therapies to be programmed off if applicable with device back to pre-MRI settings after completion of exam.  [employer] - Programming recommendation received through [employer] App/Tablet  Arlys Lamer, RT  11/19/2023 10:21 AM

## 2023-11-28 ENCOUNTER — Encounter (INDEPENDENT_AMBULATORY_CARE_PROVIDER_SITE_OTHER): Payer: Self-pay | Admitting: Internal Medicine

## 2023-11-28 ENCOUNTER — Ambulatory Visit (INDEPENDENT_AMBULATORY_CARE_PROVIDER_SITE_OTHER): Admitting: Internal Medicine

## 2023-11-28 VITALS — BP 109/72 | HR 83 | Temp 98.5°F | Ht 69.0 in | Wt 270.0 lb

## 2023-11-28 DIAGNOSIS — I5020 Unspecified systolic (congestive) heart failure: Secondary | ICD-10-CM | POA: Diagnosis not present

## 2023-11-28 DIAGNOSIS — Z794 Long term (current) use of insulin: Secondary | ICD-10-CM | POA: Diagnosis not present

## 2023-11-28 DIAGNOSIS — I130 Hypertensive heart and chronic kidney disease with heart failure and stage 1 through stage 4 chronic kidney disease, or unspecified chronic kidney disease: Secondary | ICD-10-CM

## 2023-11-28 DIAGNOSIS — N1831 Chronic kidney disease, stage 3a: Secondary | ICD-10-CM

## 2023-11-28 DIAGNOSIS — E1122 Type 2 diabetes mellitus with diabetic chronic kidney disease: Secondary | ICD-10-CM | POA: Diagnosis not present

## 2023-11-28 DIAGNOSIS — Z6841 Body Mass Index (BMI) 40.0 and over, adult: Secondary | ICD-10-CM | POA: Diagnosis not present

## 2023-11-28 DIAGNOSIS — R948 Abnormal results of function studies of other organs and systems: Secondary | ICD-10-CM | POA: Diagnosis not present

## 2023-11-28 DIAGNOSIS — E66813 Obesity, class 3: Secondary | ICD-10-CM

## 2023-11-28 DIAGNOSIS — G4733 Obstructive sleep apnea (adult) (pediatric): Secondary | ICD-10-CM | POA: Diagnosis not present

## 2023-11-28 DIAGNOSIS — E669 Obesity, unspecified: Secondary | ICD-10-CM | POA: Insufficient documentation

## 2023-11-28 NOTE — Progress Notes (Signed)
 Office: [phone]  /  Fax: [phone]  Weight Summary And Biometrics  Vitals Temp: 98.5 F (36.9 C) BP: 109/72 Pulse Rate: 83 SpO2: 99 %   Anthropometric Measurements Height: 5\' 9"  (1.753 m) Weight: 270 lb (122.5 kg) BMI (Calculated): 39.85 Weight at Last Visit: 272lb Weight Lost Since Last Visit: 2lb Weight Gained Since Last Visit: 0lb Starting Weight: 271lb Total Weight Loss (lbs): 1 lb (0.454 kg) Peak Weight: 325lb   Body Composition  Body Fat %: 40.4 % Fat Mass (lbs): 109.4 lbs Muscle Mass (lbs): 153.2 lbs Total Body Water  (lbs): 111.6 lbs Visceral Fat Rating : 27    No data recorded Today's Visit #: 6  Starting Date: 05/22/23   Subjective   Chief Complaint: Obesity  Interval History [name] presents today for medical weight management.  He has obesity associated with complex conditions.  He reports following a 1500-calorie nutritional target but only 10 to 20% of the time.  He is not tracking calories he reports eating more whole foods getting the recommended amount of protein maintaining adequate hydration denies skipping meals but acknowledges skipping lunch most days.  When I met the patient he had a restrictive eating pattern and had been on Ozempic  2 mg a day.  He has metabolic slowing and obviously was gaining weight on GLP-1.  We therefore reduced medication to 1 mg to increase hunger and as the patient could meet nutritional requirements in hopes that this will increase his metabolic rate.  He has lost 2 pounds since the last office visit he has also been increasing his physical activity and is exercising 5 days a week for about 60 minutes  Challenges affecting patient progress: medical comorbidities.    Pharmacotherapy for weight management: He is currently taking Ozempic  with diabetes as the primary indication with adequate clinical response  and without side effects..   Assessment and Plan   Treatment Plan For Obesity:  Recommended  Dietary Goals  Erik Black is currently in the action stage of change. As such, his goal is to continue weight management plan. He has agreed to: portion control, balanced plate and making smarter food choices, such as increasing vegetables, protein intake and reducing simple carbohydrates and processed foods , continue to work on implementation of reduced calorie nutrition plan (RCNP), and advised to use a meal replacement at lunch instead of skipping altogether.  Behavioral Health and Counseling  We discussed the following behavioral modification strategies today: continue to work on maintaining a reduced calorie state, getting the recommended amount of protein, incorporating whole foods, making healthy choices, staying well hydrated and practicing mindfulness when eating..  Additional education and resources provided today: None  Recommended Physical Activity Goals  [name] has been advised to work up to 150 minutes of moderate intensity aerobic activity a week and strengthening exercises 2-3 times per week for cardiovascular health, weight loss maintenance and preservation of muscle mass.   He has agreed to :  Think about enjoyable ways to increase daily physical activity and overcoming barriers to exercise and Increase physical activity in their day and reduce sedentary time (increase NEAT).  Pharmacotherapy  We discussed various medication options to help Erik Black with his weight loss efforts and we both agreed to : adequate clinical response to anti-obesity medication, continue current regimen  Associated Conditions Impacted by Obesity Treatment  OSA on CPAP  Type 2 diabetes mellitus with stage 3a chronic kidney disease, with long-term current use of insulin  (HCC)  Class 3 severe obesity with serious  comorbidity and body mass index (BMI) of 40.0 to 44.9 in adult, unspecified obesity type  Abnormal metabolism     Assessment and Plan    Heart failure with reduced ejection  fraction Heart failure with reduced ejection fraction, managed with Entresto , torsemide  and spironolactone . Reports nocturia, possibly due to fluid redistribution at night.  - Elevate the head of the bed to reduce nocturia. - Restrict fluid intake two hours before bedtime. - Continue Entresto .  Type 2 diabetes mellitus Type 2 diabetes mellitus managed with Ozempic  1 mg. Last A1c was 6.6. No symptoms of hypo- or hyperglycemia. Current regimen may suppress appetite excessively, leading to insufficient caloric intake. - Continue Ozempic  1 mg. - Monitor blood glucose levels. - Ensure adequate nutrition by not skipping meals.  Hypertension Hypertension well-controlled with current medication regimen. No dizziness or lightheadedness reported. - Continue antihypertensive medications.  Obesity /abnormal metabolic rate Obesity with recent weight loss of two pounds. Engaging in physical activity and strength training. Current caloric intake may be insufficient due to skipping lunch. Metabolic rate is 1610, below the expected 2200, indicating a slow metabolism. - Add a protein shake for lunch to increase caloric intake. - Continue physical activity regimen.  Obstructive sleep apnea Obstructive sleep apnea managed with consistent CPAP use. - Continue CPAP therapy.         Objective   Physical Exam:  Blood pressure 109/72, pulse 83, temperature 98.5 F (36.9 C), height 5\' 9"  (1.753 m), weight 270 lb (122.5 kg), SpO2 99%. Body mass index is 39.87 kg/m.  General: He is overweight, cooperative, alert, well developed, and in no acute distress. PSYCH: Has normal mood, affect and thought process.   HEENT: EOMI, sclerae are anicteric. Lungs: Normal breathing effort, no conversational dyspnea. Extremities: No edema.  Neurologic: No gross sensory or motor deficits. No tremors or fasciculations noted.    Diagnostic Data Reviewed:  BMET    Component Value Date/Time   NA 143 10/08/2023 1216    K 3.8 10/08/2023 1216   CL 102 10/08/2023 1216   CO2 22 10/08/2023 1216   GLUCOSE 99 10/08/2023 1216   GLUCOSE 115 (H) 04/14/2023 1421   BUN 15 10/08/2023 1216   CREATININE 1.50 (H) 10/08/2023 1216   CALCIUM  9.7 10/08/2023 1216   GFRNONAA 41 (L) 04/14/2023 1421   GFRAA 59 (L) 11/24/2019 0628   Lab Results  Component Value Date   HGBA1C 7.2 (H) 05/22/2023   HGBA1C 6.1 (H) 04/16/2019   No results found for: "INSULIN " Lab Results  Component Value Date   TSH 0.873 09/13/2023   CBC    Component Value Date/Time   WBC 6.6 04/14/2023 1421   RBC 4.64 04/14/2023 1421   HGB 13.5 04/14/2023 1421   HCT 40.6 04/14/2023 1421   PLT 137 (L) 04/14/2023 1421   MCV 87.5 04/14/2023 1421   MCH 29.1 04/14/2023 1421   MCHC 33.3 04/14/2023 1421   RDW 13.0 04/14/2023 1421   Iron Studies No results found for: "IRON", "TIBC", "FERRITIN", "IRONPCTSAT" Lipid Panel     Component Value Date/Time   CHOL 152 01/23/2023 0526   TRIG 134 01/23/2023 0526   HDL 34 (L) 01/23/2023 0526   CHOLHDL 4.5 01/23/2023 0526   VLDL 27 01/23/2023 0526   LDLCALC 91 01/23/2023 0526   Hepatic Function Panel     Component Value Date/Time   PROT 6.8 10/08/2023 1216   ALBUMIN  4.3 10/08/2023 1216   AST 14 10/08/2023 1216   ALT 14 10/08/2023 1216   ALKPHOS  151 (H) 10/08/2023 1216   BILITOT 0.5 10/08/2023 1216      Component Value Date/Time   TSH 0.873 09/13/2023 1228   Nutritional Lab Results  Component Value Date   VD25OH 49.3 05/22/2023    Medications: Outpatient Encounter Medications as of 11/28/2023  Medication Sig Note   acetaminophen  (TYLENOL ) 500 MG tablet Take 1,000 mg by mouth as needed for moderate pain (pain score 4-6). 04/15/2023: Pt and Wife are unsure of last dose.    aspirin  EC 81 MG tablet Take 1 tablet (81 mg total) by mouth 2 (two) times daily for 6 weeks, then as directed by provider.    atorvastatin  (LIPITOR) 40 MG tablet Take 1 tablet (40 mg total) by mouth at bedtime.    BREZTRI  AEROSPHERE 160-9-4.8 MCG/ACT AERO Inhale 1 puff into the lungs 2 (two) times daily.    Brinzolamide -Brimonidine  1-0.2 % SUSP Place 1 drop into both eyes 3 (three) times daily.    carvedilol  (COREG ) 25 MG tablet TAKE 1 TABLET BY MOUTH TWICE A DAY    celecoxib (CELEBREX) 200 MG capsule Take 200 mg by mouth daily.    Cholecalciferol  (VITAMIN D ) 50 MCG (2000 UT) tablet Take 4,000 Units by mouth daily.     docusate sodium  (COLACE) 100 MG capsule Take 100 mg by mouth daily as needed for mild constipation or moderate constipation.    DULoxetine  (CYMBALTA ) 60 MG capsule Take 60 mg by mouth daily.    ENTRESTO  97-103 MG TAKE 1 TABLET BY MOUTH TWICE A DAY    Insulin  Pen Needle 32G X 4 MM MISC Use at bedtime.    latanoprost  (XALATAN ) 0.005 % ophthalmic solution Place 1 drop into both eyes at bedtime.    mexiletine (MEXITIL ) 200 MG capsule TAKE 1 CAPSULE BY MOUTH TWICE A DAY    Multiple Vitamins-Minerals (MULTIVITAMIN WITH MINERALS) tablet Take 1 tablet by mouth daily.    Omega-3 1000 MG CAPS Take 1,000 mg by mouth 3 (three) times daily after meals.    omeprazole (PRILOSEC) 20 MG capsule Take 20 mg by mouth 2 (two) times daily.    OXcarbazepine  (TRILEPTAL ) 150 MG tablet Take 150 mg by mouth 2 (two) times daily.    potassium chloride  SA (KLOR-CON  M) 20 MEQ tablet Take 20 mEq by mouth daily.    Semaglutide , 2 MG/DOSE, (OZEMPIC , 2 MG/DOSE,) 8 MG/3ML SOPN Inject 2 mg into the skin once a week.    spironolactone  (ALDACTONE ) 25 MG tablet TAKE 1 TABLET (25 MG TOTAL) BY MOUTH DAILY.    timolol  (TIMOPTIC -XR) 0.5 % ophthalmic gel-forming Place 1 drop into both eyes daily.    torsemide  (DEMADEX ) 20 MG tablet TAKE 2 TABLETS (40 MG TOTAL) BY MOUTH DAILY.    No facility-administered encounter medications on file as of 11/28/2023.     Follow-Up   Return in about 4 weeks (around 12/26/2023) for For Weight Mangement with Dr. Allie Area.Aaron Aas He was informed of the importance of frequent follow up visits to maximize his success  with intensive lifestyle modifications for his multiple health conditions.  Attestation Statement   Reviewed by clinician on day of visit: allergies, medications, problem list, medical history, surgical history, family history, social history, and previous encounter notes.     Ladd Picker, MD

## 2023-11-30 ENCOUNTER — Other Ambulatory Visit: Payer: Self-pay | Admitting: Family

## 2023-11-30 ENCOUNTER — Other Ambulatory Visit: Payer: Self-pay | Admitting: Internal Medicine

## 2023-12-05 ENCOUNTER — Telehealth (HOSPITAL_COMMUNITY): Payer: Self-pay

## 2023-12-05 ENCOUNTER — Telehealth: Payer: Self-pay | Admitting: Internal Medicine

## 2023-12-05 NOTE — Telephone Encounter (Signed)
   Name: Erik Black  DOB: July 29, 1950  MRN: [id number]  Primary Cardiologist: Constancia Delton, MD  Chart reviewed as part of pre-operative protocol coverage. Because of Erik Black past medical history and time since last visit, he will require a follow-up in-office visit in order to better assess preoperative cardiovascular risk.  Last seen 09/13/2023 by Dr. Julane Ny who had noted a 4-month follow-up.  He will need a follow-up visit with Dr. [facility].  Pre-op covering staff: - Please schedule appointment and call patient to inform them. If patient already had an upcoming appointment within acceptable timeframe, please add "pre-op clearance" to the appointment notes so provider is aware. - Please contact requesting surgeon's office via preferred method (i.e, phone, fax) to inform them of need for appointment prior to surgery.  Ideally aspirin  should be continued without interruption, however if the bleeding risk is too great, aspirin  may be held for 5-7 days prior to surgery. Please resume aspirin  post operatively when it is felt to be safe from a bleeding standpoint.    Ava Boatman, NP  12/05/2023, 3:29 PM

## 2023-12-05 NOTE — Progress Notes (Signed)
 PERIOPERATIVE PRESCRIPTION FOR IMPLANTED CARDIAC DEVICE PROGRAMMING   Patient Information:  Patient: Erik Black  MRN: [id number]  Date of Birth: 10/11/1950    Procedure:  Right Reverse Total Shoulder Arthroplasty with Block for Anesthesia    Date of Surgery:  Clearance 12/31/23                                Surgeon:  Dr. Bebe Bourdon Group or Practice Name:  [facility]  Phone number:  [phone] Fax number:  [phone]   Device Information:   Clinic EP Physician:   Dr. Harvie Liner Device Type:  Defibrillator Manufacturer and Phone #:  Medtr[employer]: [phone] Pacemaker Dependent?:  No Date of Last Device Check:  10/29/2023         Normal Device Function?:  Yes     Electrophysiologist's Recommendations:   Have magnet available. Provide continuous ECG monitoring when magnet is used or reprogramming is to be performed.  Procedure will likely interfere with device function.  Device should be programmed:  Tachy therapies disabled  Per Device Clinic Standing Orders, Erik Black  12/05/2023 12:21 PM

## 2023-12-05 NOTE — Telephone Encounter (Signed)
  ADVANCED HEART FAILURE CLINIC   Pre-operative Risk Assessment    Request for Surgical Clearance{  Procedure:  Right Reverse Total Shoulder Arthroplasty with Block for Anesthesia   Date of Surgery:  Clearance 12/31/23                                Surgeon:  Dr. Bebe Bourdon Group or Practice Name:  [facility]  Phone number:  [phone] Fax number:  [phone] { Type of Clearance Requested:   - Medical  - Pharmacy:  Hold Aspirin    { Type of Anesthesia:  Block  { Additional requests/questions:  Also has questions about Device Management- does it need magnet? Does it need to be re programmed/interrogated? Will forward that over to device clinic for management and advice.   Signed, Ashtan Laton B Yara Tomkinson   12/05/2023, 10:46 AM

## 2023-12-05 NOTE — Telephone Encounter (Signed)
 Will forward to [facility] per preop APP the pt will need a 3 month f/u with Dr. Bensimhon. I will reach out to Dr. Andria Keeler scheduling team to call the pt with an appt.    I will update the surgeon office the pt will need appt with the heart failure clinic before he can be cleared.

## 2023-12-06 MED ORDER — MEXILETINE HCL 200 MG PO CAPS: 200.0000 mg | ORAL_CAPSULE | Freq: Two times a day (BID) | ORAL | 3 refills | Status: AC

## 2023-12-06 NOTE — Telephone Encounter (Signed)
 [facility] scheduler Roslyn sent a staff message the pt has appt 12/11/23 for 3 month f/u and preop clearance.

## 2023-12-06 NOTE — Progress Notes (Signed)
 Medication refill sent in for 1 year for Mexiletine.

## 2023-12-11 ENCOUNTER — Encounter (HOSPITAL_COMMUNITY)

## 2023-12-11 ENCOUNTER — Telehealth: Payer: Self-pay | Admitting: Cardiology

## 2023-12-11 NOTE — Telephone Encounter (Signed)
 Called to confirm/remind patient of their appointment at the [facility] Failure Clinic on 12/12/23.   Appointment:   [x] Confirmed  [] Left mess   [] No answer/No voice mail  [] VM Full/unable to leave message  [] Phone not in service  Patient reminded to bring all medications and/or complete list.  Confirmed patient has transportation. Gave directions, instructed to utilize valet parking.

## 2023-12-12 ENCOUNTER — Encounter: Payer: Self-pay | Admitting: Cardiology

## 2023-12-12 ENCOUNTER — Ambulatory Visit: Attending: Cardiology | Admitting: Cardiology

## 2023-12-12 VITALS — BP 127/77 | HR 81 | Wt 277.6 lb

## 2023-12-12 DIAGNOSIS — I5020 Unspecified systolic (congestive) heart failure: Secondary | ICD-10-CM | POA: Diagnosis not present

## 2023-12-12 DIAGNOSIS — I493 Ventricular premature depolarization: Secondary | ICD-10-CM | POA: Diagnosis not present

## 2023-12-12 DIAGNOSIS — Z79899 Other long term (current) drug therapy: Secondary | ICD-10-CM | POA: Diagnosis not present

## 2023-12-12 DIAGNOSIS — G4733 Obstructive sleep apnea (adult) (pediatric): Secondary | ICD-10-CM | POA: Insufficient documentation

## 2023-12-12 DIAGNOSIS — I5022 Chronic systolic (congestive) heart failure: Secondary | ICD-10-CM | POA: Insufficient documentation

## 2023-12-12 DIAGNOSIS — E119 Type 2 diabetes mellitus without complications: Secondary | ICD-10-CM | POA: Insufficient documentation

## 2023-12-12 DIAGNOSIS — Z6841 Body Mass Index (BMI) 40.0 and over, adult: Secondary | ICD-10-CM | POA: Insufficient documentation

## 2023-12-12 DIAGNOSIS — I110 Hypertensive heart disease with heart failure: Secondary | ICD-10-CM | POA: Insufficient documentation

## 2023-12-12 DIAGNOSIS — Z95810 Presence of automatic (implantable) cardiac defibrillator: Secondary | ICD-10-CM | POA: Diagnosis not present

## 2023-12-12 MED ORDER — VERICIGUAT 2.5 MG PO TABS
2.5000 mg | ORAL_TABLET | Freq: Every day | ORAL | 6 refills | Status: AC
Start: 2023-12-12 — End: ?

## 2023-12-12 NOTE — Patient Instructions (Signed)
 Medication Changes:  START Vericiguat 2.5mg  (1 tab) daily  Follow-Up in: Please follow up with the [facility] Clinic in 3 months with Dr. Bensimhon.  At the [facility], you and your health needs are our priority. We have a designated team specialized in the treatment of Heart Failure. This Care Team includes your primary Heart Failure Specialized Cardiologist (physician), Advanced Practice Providers (APPs- Physician Assistants and Nurse Practitioners), and Pharmacist who all work together to provide you with the care you need, when you need it.   You may see any of the following providers on your designated Care Team at your next follow up:  Dr. Jules Oar Dr. Peder Bourdon Dr. Alwin Baars Dr. Judyth Nunnery Shawnee Dellen, FNP Bevely Brush, RPH-CPP  Please be sure to bring in all your medications bottles to every appointment.   Need to Contact Us :  If you have any questions or concerns before your next appointment please send us  a message through [place] or call our office at [phone].    TO LEAVE A MESSAGE FOR THE NURSE SELECT OPTION 2, PLEASE LEAVE A MESSAGE INCLUDING: YOUR NAME DATE OF BIRTH CALL BACK NUMBER REASON FOR CALL**this is important as we prioritize the call backs  YOU WILL RECEIVE A CALL BACK THE SAME DAY AS LONG AS YOU CALL BEFORE 4:00 PM

## 2023-12-12 NOTE — Progress Notes (Signed)
 ADVANCED HEART FAILURE FOLLOW UP CLINIC NOTE  Referring Physician: [facility]  Primary Care: [facility] Primary Cardiologist:  HPI: Erik Black is a 73 y.o. male with a PMH of morbid obesity, HTN, OSA, DM, HFrEF who presents for follow up of chronic systolic heart failure.     Diagnosed with HF in 9/23. Echo 9/23 EF 30-35%. Started lasix . Referred to [facility]   Admitted 11/23 with ADHF cath as below. Treated with IV lasix  and GDMT titrated. Diuresed 22 pounds.    Underwent ICD placement 01/28/23, admitted in 04/2023 with mechanical fall.      SUBJECTIVE:  Patient overall doing stable from his last visit. His chief complaints continues to be fatigue with exertion, he used to be very active and for the past few years he does not have the same energy. He denies any worsening chest pain, shortness of breath, edema, orthopnea, PND. He is still awaiting potential barostim, but was denied by insurance.   PMH, current medications, allergies, social history, and family history reviewed in epic.  PHYSICAL EXAM: Vitals:   12/12/23 1104  BP: 127/77  Pulse: 81  SpO2: 100%   GENERAL: Well nourished and in no apparent distress at rest.  PULM:  Normal work of breathing, clear to auscultation bilaterally. Respirations are unlabored.  CARDIAC:  JVP: flat         Normal rate with regular rhythm. No murmurs, rubs or gallops.  Trace LE edema. Warm and well perfused extremities. ABDOMEN: Soft, non-tender, non-distended. NEUROLOGIC: Patient is oriented x3 with no focal or lateralizing neurologic deficits.    DATA REVIEW  ECG: 08/30/23: NSR, incomplete LBBB    ECHO: - Echo 3/24: EF 20-25% RV ok  - Echo 7/24; EF 25-30% RV ok   CMR:  - cMRI: 11/23 EF 25% mid wall LGE strip c/w NICM. RV moderately down.   Zio:  Zio 11/23: Occasional NSVT 13.7% PVCs (one predominant morphology 13.6%) ASSESSMENT & PLAN:  1. Chronic systolic HF - Underwent MDT ICD placement 01/28/23 -  Suspect PVC CM vs familial (LMNA)  No evidence of infiltrative process on MRI  - EF has not improved with PVC suppression with mexilitene. He has met with Dr. Drexel Gentles in [place] who agrees that Fhx is concerning for potential genetic CM however he has no first-degree relatives that would benefit from screening so has decided not to pursue - Stable NYHA class IIIb symptoms, chiefly fatigue - Awaiting potential barostim given normal BNP, good medical therapy, but limiting symptoms but insurance denied - Discussed potential vericiguat  for symptom management, willing to trial. Start 2.5mg  daily today, follow up with pharmacy in 3-4 weeks for titration - Stable volume status, continue torsemide  40mg  daliy - Continue entresto  97/103mg  BID, carvedilol  25mg  BID, spironolactone  25mg  daily - Intolerant of jardiance, yeast infection. - Patient is a moderate risk candidate for moderate risk surgical procedure, having ongoing right shoulder pain.  He is well optimized from a cardiovascular standpoint and needs no further workup prior to any planned procedure.  Would recommend being done in a hospital setting given cardiac history.   2. Frequent PVCs - Zio 11/23: Occasional NSVT 13.7% PVCs (one predominant morphology 13.6%) - Continue mexiletine 200 mg twice daily for suppression - Zio 6/24 Sinus. 2.1% PVCs (suppressed on mexilitene)   3. HTN - Blood pressure well-controlled, continue current regimen   4. OSA - compliant with CPAP   5. Morbid obesity  - continue GLP-1 RA - Body mass index is 40.9  Follow up in 4 weeks with pharmacy, 3 months with Dr.Bensimhon  Arta Lark, MD Advanced Heart Failure Mechanical Circulatory Support 12/13/23

## 2023-12-13 ENCOUNTER — Other Ambulatory Visit (HOSPITAL_COMMUNITY): Payer: Self-pay

## 2023-12-13 ENCOUNTER — Telehealth: Payer: Self-pay | Admitting: Pharmacist

## 2023-12-13 NOTE — Progress Notes (Signed)
 Remote ICD transmission.

## 2023-12-13 NOTE — Telephone Encounter (Signed)
 Prior authorization submitted for Verquvo   (Key: [id number])

## 2023-12-16 ENCOUNTER — Other Ambulatory Visit (HOSPITAL_COMMUNITY): Payer: Self-pay

## 2023-12-16 NOTE — Telephone Encounter (Signed)
 Appeal has been approved for a $0 copay. Patient called and notified.

## 2023-12-16 NOTE — Telephone Encounter (Signed)
 Pt seen in office 6/12 for surg clearance, OV note faxed to [facility] at [phone]

## 2023-12-16 NOTE — Telephone Encounter (Signed)
 Prior authorization for Verquvo  has been denied because the patient does not have a HF admission or requirement for IV diuresis within the past 6 months. An appeal has been sent to the insurance provider.

## 2023-12-17 ENCOUNTER — Telehealth: Payer: Self-pay

## 2023-12-17 NOTE — Telephone Encounter (Signed)
 Called pt to relay information on denied verquvo  prior auth. Pt verbalized understanding and agreeable to plan.

## 2023-12-17 NOTE — Telephone Encounter (Signed)
-----   Message from Lauralee Poll sent at 12/16/2023 10:27 AM EDT ----- Regarding: RE: verquvo  denial Not really, thanks for trying.  Ben ----- Message ----- From: Abbey Hobby, RN Sent: 12/16/2023   9:11 AM EDT To: Lauralee Poll, MD Subject: verquvo  denial                                 Hey, so the prior authorization for his verquvo  was denied because he hasn't had IV lasix  in the last 6 months according to [facility]. Is there anything else that you would like to switch to?

## 2023-12-24 MED ORDER — OZEMPIC (2 MG/DOSE) 8 MG/3ML ~~LOC~~ SOPN: 2.0000 mg | PEN_INJECTOR | SUBCUTANEOUS | 6 refills | Status: DC

## 2023-12-24 NOTE — Telephone Encounter (Signed)
 [facility], could you review Rx request to ensure dose is appropriate before we refill? Thank you!

## 2024-01-02 ENCOUNTER — Other Ambulatory Visit (HOSPITAL_COMMUNITY): Payer: Self-pay | Admitting: Cardiology

## 2024-01-02 MED ORDER — OZEMPIC (2 MG/DOSE) 8 MG/3ML ~~LOC~~ SOPN: 2.0000 mg | PEN_INJECTOR | SUBCUTANEOUS | 3 refills | Status: AC

## 2024-01-02 NOTE — Telephone Encounter (Signed)
 For insurance purposes 90 day supply of ozempic  sent to pharmacy as requested per [facility]

## 2024-01-08 ENCOUNTER — Encounter (INDEPENDENT_AMBULATORY_CARE_PROVIDER_SITE_OTHER): Payer: Self-pay | Admitting: Internal Medicine

## 2024-01-08 ENCOUNTER — Ambulatory Visit (INDEPENDENT_AMBULATORY_CARE_PROVIDER_SITE_OTHER): Admitting: Internal Medicine

## 2024-01-08 VITALS — BP 102/71 | HR 80 | Temp 98.0°F | Ht 69.0 in | Wt 269.0 lb

## 2024-01-08 DIAGNOSIS — Z7985 Long-term (current) use of injectable non-insulin antidiabetic drugs: Secondary | ICD-10-CM | POA: Diagnosis not present

## 2024-01-08 DIAGNOSIS — R948 Abnormal results of function studies of other organs and systems: Secondary | ICD-10-CM

## 2024-01-08 DIAGNOSIS — Z794 Long term (current) use of insulin: Secondary | ICD-10-CM | POA: Diagnosis not present

## 2024-01-08 DIAGNOSIS — Z6839 Body mass index (BMI) 39.0-39.9, adult: Secondary | ICD-10-CM

## 2024-01-08 DIAGNOSIS — G4733 Obstructive sleep apnea (adult) (pediatric): Secondary | ICD-10-CM

## 2024-01-08 DIAGNOSIS — E66813 Obesity, class 3: Secondary | ICD-10-CM

## 2024-01-08 DIAGNOSIS — Z6841 Body Mass Index (BMI) 40.0 and over, adult: Secondary | ICD-10-CM

## 2024-01-08 DIAGNOSIS — I10 Essential (primary) hypertension: Secondary | ICD-10-CM

## 2024-01-08 DIAGNOSIS — E1122 Type 2 diabetes mellitus with diabetic chronic kidney disease: Secondary | ICD-10-CM

## 2024-01-08 DIAGNOSIS — N1831 Chronic kidney disease, stage 3a: Secondary | ICD-10-CM

## 2024-01-08 DIAGNOSIS — I129 Hypertensive chronic kidney disease with stage 1 through stage 4 chronic kidney disease, or unspecified chronic kidney disease: Secondary | ICD-10-CM

## 2024-01-08 NOTE — Assessment & Plan Note (Signed)
 Patient has complex obesity with multiple contributors to his weight including genetics, age, slow metabolic rate presence of chronic diseases like diabetes and heart failure which impacts metabolic rate as well as muscle mass.  He benefits from cardiac rehabilitation as he is deconditioned and this would help improve his metabolic state.  He is currently maintaining a reduced calorie state but with inadequate protein intake.  I counseled him and his wife on increasing protein intake to about 90 g/day which is adequate for him even in the presence of chronic kidney disease.

## 2024-01-08 NOTE — Assessment & Plan Note (Signed)
 On CPAP with reported good compliance. Continue PAP therapy. Losing 15% or more of body weight may improve AHI.

## 2024-01-08 NOTE — Assessment & Plan Note (Signed)
 Patient has a slower than predicted metabolism. IC 1901 vs. calculated 2223. This may contribute to weight gain, chronic fatigue and difficulty losing weight.  This is likely secondary to low levels of physical activity , chronic conditions and possibly skipping of meals  We reviewed measures to improve metabolism including not skipping meals, progressive strengthening exercises, increasing protein intake at every meal and maintaining adequate hydration and sleep.

## 2024-01-08 NOTE — Assessment & Plan Note (Signed)
 His last A1c was in the 6 range and improved.  He is no longer requiring fast acting insulin  or long-acting insulin .  He is currently on Ozempic .  He has changed his diet significantly and has reduced simple and added sugars which have resulted in improvement in quality of life and no longer needing insulin .

## 2024-01-08 NOTE — Assessment & Plan Note (Signed)
 Hypertension managed with medication. Blood pressure recorded at 102/21, potentially influenced by the new heart failure medication. Adequate hydration is essential to prevent dehydration, especially in hot weather. - Monitor blood pressure regularly, especially for symptoms of dizziness or lightheadedness. - Ensure adequate hydration, particularly in hot weather, to prevent dehydration.

## 2024-01-08 NOTE — Progress Notes (Signed)
 Office: [phone]  /  Fax: [phone]  Weight Summary and Body Composition Analysis (BIA)  Vitals Temp: 98 F (36.7 C) BP: 102/71 Pulse Rate: 80 SpO2: 98 %   Anthropometric Measurements Height: 5' 9 (1.753 m) Weight: 269 lb (122 kg) BMI (Calculated): 39.71 Weight at Last Visit: 270 lb Weight Lost Since Last Visit: 1 lb Weight Gained Since Last Visit: 0 lb Starting Weight: 271 lb Total Weight Loss (lbs): 2 lb (0.907 kg) Peak Weight: 325 lb   Body Composition  Body Fat %: 40.4 % Fat Mass (lbs): 109 lbs Muscle Mass (lbs): 152.8 lbs Total Body Water  (lbs): 113 lbs Visceral Fat Rating : 27    RMR: 1901  Today's Visit #: 7  Starting Date: 05/22/23   Subjective   Chief Complaint: Obesity  Interval History Discussed the use of AI scribe software for clinical note transcription with the patient, who gave verbal consent to proceed.  History of Present Illness   Erik Black is a 73 year old male with complex obesity who presents for medical weight management.  He has been participating in a weight management program and has discontinued both rapid-acting and long-acting insulin . He experiences a slow metabolic rate, which affects his weight loss. He is currently on semaglutide , administering one milligram once a week. Despite these efforts, he has lost only one pound since the last visit. He follows a 1500 calorie nutrition plan about 70% of the time and incorporates more whole foods into his diet. He does not track his food intake but feels he is getting the recommended amount of protein and maintaining adequate hydration. He no longer skips meals.  He has a history of heart failure with reduced ejection fraction and is on medical therapy. Recently, he started a new heart failure medication, which has significantly improved his energy levels. He has not engaged in exercise or cardiac rehabilitation.  He has hypertension, coronary artery disease, and sleep  apnea for which he uses CPAP. He also has type 2 diabetes with stage 3A chronic kidney disease. His diabetes management has improved, as evidenced by a significant reduction in his A1c and the discontinuation of some insulin  medications.  He previously had a restrictive eating pattern with chronic meal skipping but now eats three meals a day. His wife encourages him to eat even when he is not hungry to maintain energy levels. He feels his quality of life has improved since the last visit, and he can do more activities, such as yard work, without feeling fatigued.  He acknowledges a slow metabolic rate, measured at 1901 calories versus a calculated 2220 calories, contributing to his slow weight loss.       Challenges affecting patient progress: medical comorbidities and slow metabolism for age.    Pharmacotherapy for weight management: He is currently taking Ozempic  with diabetes as the primary indication with adequate clinical response  and without side effects..   Assessment and Plan   Treatment Plan For Obesity:  Recommended Dietary Goals  Lovis is currently in the action stage of change. As such, his goal is to continue weight management plan. He has agreed to: portion control, balanced plate and making smarter food choices, such as increasing vegetables, protein intake and reducing simple carbohydrates and processed foods   Behavioral Health and Counseling  We discussed the following behavioral modification strategies today: increasing lean protein intake to established goals, decreasing simple carbohydrates , increasing vegetables, increasing fiber rich foods, avoiding skipping meals, increasing water  intake , and  work on meal planning and preparation.  Additional education and resources provided today: Provided with personal guidance and instructions on how to use [employer] for healthy meal ideas and cooking in bulk.  Recommended Physical Activity Goals  Fadi has  been advised to work up to 150 minutes of moderate intensity aerobic activity a week and strengthening exercises 2-3 times per week for cardiovascular health, weight loss maintenance and preservation of muscle mass.   He has agreed to :  Think about enjoyable ways to increase daily physical activity and overcoming barriers to exercise and Increase physical activity in their day and reduce sedentary time (increase NEAT).  I highly encouraged patient to participate in cardiac rehab program.  Medical Interventions and Pharmacotherapy  We discussed various medication options to help Donyae with his weight loss efforts and we both agreed to : Adequate clinical response to anti-obesity medication, continue current regimen and we will not increase his Ozempic  as he developed restrictive eating pattern when he was on a higher dose at the start of our program.  Overall patient has noticed a significant improvement in his diabetes as well as quality of life.  Associated Conditions Impacted by Obesity Treatment  Assessment & Plan Type 2 diabetes mellitus with stage 3a chronic kidney disease, with long-term current use of insulin  (HCC) His last A1c was in the 6 range and improved.  He is no longer requiring fast acting insulin  or long-acting insulin .  He is currently on Ozempic .  He has changed his diet significantly and has reduced simple and added sugars which have resulted in improvement in quality of life and no longer needing insulin . OSA on CPAP On CPAP with reported good compliance. Continue PAP therapy. Losing 15% or more of body weight may improve AHI.    Stage 3a chronic kidney disease (HCC) Most recent GFR in Care Everywhere from the [place] from January 2025 was 42 close to baseline.  His blood pressure is adequately controlled.  Patient counseled on avoiding nephrotoxins and maintaining adequate hydration. Primary hypertension Hypertension managed with medication. Blood pressure recorded at  102/21, potentially influenced by the new heart failure medication. Adequate hydration is essential to prevent dehydration, especially in hot weather. - Monitor blood pressure regularly, especially for symptoms of dizziness or lightheadedness. - Ensure adequate hydration, particularly in hot weather, to prevent dehydration. Abnormal metabolism Patient has a slower than predicted metabolism. IC 1901 vs. calculated 2223. This may contribute to weight gain, chronic fatigue and difficulty losing weight.  This is likely secondary to low levels of physical activity , chronic conditions and possibly skipping of meals  We reviewed measures to improve metabolism including not skipping meals, progressive strengthening exercises, increasing protein intake at every meal and maintaining adequate hydration and sleep.   Class 3 severe obesity with serious comorbidity and body mass index (BMI) of 40.0 to 44.9 in adult Patient has complex obesity with multiple contributors to his weight including genetics, age, slow metabolic rate presence of chronic diseases like diabetes and heart failure which impacts metabolic rate as well as muscle mass.  He benefits from cardiac rehabilitation as he is deconditioned and this would help improve his metabolic state.  He is currently maintaining a reduced calorie state but with inadequate protein intake.  I counseled him and his wife on increasing protein intake to about 90 g/day which is adequate for him even in the presence of chronic kidney disease.     Follow-up Plans to reengage with cardiac rehabilitation and follow up with  his cardiologist for further instructions. Cardiac rehabilitation is covered by Medicare and is crucial for improving his condition. - Contact Dr. Jens office for cardiac rehabilitation instructions. - Reengage with the cardiac rehabilitation program.        Objective   Physical Exam:  Blood pressure 102/71, pulse 80, temperature 98 F  (36.7 C), height 5' 9 (1.753 m), weight 269 lb (122 kg), SpO2 98%. Body mass index is 39.72 kg/m.  General: He is overweight, cooperative, alert, well developed, and in no acute distress. PSYCH: Has normal mood, affect and thought process.   HEENT: EOMI, sclerae are anicteric. Lungs: Normal breathing effort, no conversational dyspnea. Extremities: No edema.  Neurologic: No gross sensory or motor deficits. No tremors or fasciculations noted.    Diagnostic Data Reviewed:  BMET    Component Value Date/Time   NA 143 10/08/2023 1216   K 3.8 10/08/2023 1216   CL 102 10/08/2023 1216   CO2 22 10/08/2023 1216   GLUCOSE 99 10/08/2023 1216   GLUCOSE 115 (H) 04/14/2023 1421   BUN 15 10/08/2023 1216   CREATININE 1.50 (H) 10/08/2023 1216   CALCIUM  9.7 10/08/2023 1216   GFRNONAA 41 (L) 04/14/2023 1421   GFRAA 59 (L) 11/24/2019 0628   Lab Results  Component Value Date   HGBA1C 7.2 (H) 05/22/2023   HGBA1C 6.1 (H) 04/16/2019   No results found for: INSULIN  Lab Results  Component Value Date   TSH 0.873 09/13/2023   CBC    Component Value Date/Time   WBC 6.6 04/14/2023 1421   RBC 4.64 04/14/2023 1421   HGB 13.5 04/14/2023 1421   HCT 40.6 04/14/2023 1421   PLT 137 (L) 04/14/2023 1421   MCV 87.5 04/14/2023 1421   MCH 29.1 04/14/2023 1421   MCHC 33.3 04/14/2023 1421   RDW 13.0 04/14/2023 1421   Iron Studies No results found for: IRON, TIBC, FERRITIN, IRONPCTSAT Lipid Panel     Component Value Date/Time   CHOL 152 01/23/2023 0526   TRIG 134 01/23/2023 0526   HDL 34 (L) 01/23/2023 0526   CHOLHDL 4.5 01/23/2023 0526   VLDL 27 01/23/2023 0526   LDLCALC 91 01/23/2023 0526   Hepatic Function Panel     Component Value Date/Time   PROT 6.8 10/08/2023 1216   ALBUMIN  4.3 10/08/2023 1216   AST 14 10/08/2023 1216   ALT 14 10/08/2023 1216   ALKPHOS 151 (H) 10/08/2023 1216   BILITOT 0.5 10/08/2023 1216      Component Value Date/Time   TSH 0.873 09/13/2023 1228    Nutritional Lab Results  Component Value Date   VD25OH 49.3 05/22/2023    Medications: Outpatient Encounter Medications as of 01/08/2024  Medication Sig Note   Vericiguat  (VERQUVO ) 2.5 MG TABS Take 2.5 mg by mouth daily at 12 noon.    acetaminophen  (TYLENOL ) 500 MG tablet Take 1,000 mg by mouth as needed for moderate pain (pain score 4-6). 04/15/2023: Pt and Wife are unsure of last dose.    aspirin  EC 81 MG tablet Take 1 tablet (81 mg total) by mouth 2 (two) times daily for 6 weeks, then as directed by provider.    atorvastatin  (LIPITOR) 40 MG tablet Take 1 tablet (40 mg total) by mouth at bedtime.    BREZTRI AEROSPHERE 160-9-4.8 MCG/ACT AERO Inhale 1 puff into the lungs 2 (two) times daily.    Brinzolamide -Brimonidine  1-0.2 % SUSP Place 1 drop into both eyes 3 (three) times daily.    carvedilol  (COREG ) 25 MG tablet TAKE  1 TABLET BY MOUTH TWICE A DAY    celecoxib (CELEBREX) 200 MG capsule Take 200 mg by mouth daily.    Cholecalciferol  (VITAMIN D ) 50 MCG (2000 UT) tablet Take 4,000 Units by mouth daily.     docusate sodium  (COLACE) 100 MG capsule Take 100 mg by mouth daily as needed for mild constipation or moderate constipation.    DULoxetine  (CYMBALTA ) 60 MG capsule Take 60 mg by mouth daily.    ENTRESTO  97-103 MG TAKE 1 TABLET BY MOUTH TWICE A DAY    Insulin  Pen Needle 32G X 4 MM MISC Use at bedtime.    KLOR-CON  M20 20 MEQ tablet TAKE 2 TABLETS BY MOUTH EVERY MORNING AND 1 TABLET IN THE EVENING    latanoprost  (XALATAN ) 0.005 % ophthalmic solution Place 1 drop into both eyes at bedtime.    mexiletine (MEXITIL ) 200 MG capsule Take 1 capsule (200 mg total) by mouth 2 (two) times daily.    Multiple Vitamins-Minerals (MULTIVITAMIN WITH MINERALS) tablet Take 1 tablet by mouth daily.    Omega-3 1000 MG CAPS Take 1,000 mg by mouth 3 (three) times daily after meals.    omeprazole (PRILOSEC) 20 MG capsule Take 20 mg by mouth 2 (two) times daily.    OXcarbazepine  (TRILEPTAL ) 150 MG tablet Take  150 mg by mouth 2 (two) times daily.    Semaglutide , 2 MG/DOSE, (OZEMPIC , 2 MG/DOSE,) 8 MG/3ML SOPN Inject 2 mg into the skin once a week.    spironolactone  (ALDACTONE ) 25 MG tablet TAKE 1 TABLET (25 MG TOTAL) BY MOUTH DAILY.    timolol  (TIMOPTIC -XR) 0.5 % ophthalmic gel-forming Place 1 drop into both eyes daily.    torsemide  (DEMADEX ) 20 MG tablet TAKE 2 TABLETS (40 MG TOTAL) BY MOUTH DAILY.    No facility-administered encounter medications on file as of 01/08/2024.     Follow-Up   Return in about 4 weeks (around 02/05/2024) for For Weight Mangement with Dr. Francyne.SABRA He was informed of the importance of frequent follow up visits to maximize his success with intensive lifestyle modifications for his multiple health conditions.  Attestation Statement   Reviewed by clinician on day of visit: allergies, medications, problem list, medical history, surgical history, family history, social history, and previous encounter notes.   I have spent 40 minutes in the care of the patient today including: 5 minutes before the visit reviewing and preparing the chart. 28 minutes face-to-face assessing and reviewing listed medical problems as outlined in obesity care plan, providing nutritional and behavioral counseling on topics outlined in the obesity care plan, independently interpreting test results and goals of care, as described in assessment and plan, and reviewing and discussing biometric information and progress 7 minutes after the visit updating chart and documentation of encounter.    Lucas Francyne, MD

## 2024-01-08 NOTE — Assessment & Plan Note (Signed)
 Most recent GFR in Care Everywhere from the [place] from January 2025 was 42 close to baseline.  His blood pressure is adequately controlled.  Patient counseled on avoiding nephrotoxins and maintaining adequate hydration.

## 2024-01-28 ENCOUNTER — Ambulatory Visit: Payer: Medicare HMO

## 2024-01-28 DIAGNOSIS — I428 Other cardiomyopathies: Secondary | ICD-10-CM | POA: Diagnosis not present

## 2024-01-29 LAB — CUP PACEART REMOTE DEVICE CHECK
Battery Remaining Longevity: 158 mo
Battery Voltage: 3.02 V
Brady Statistic RA Percent Paced: INVALID
Brady Statistic RV Percent Paced: 0 %
Date Time Interrogation Session: 20250728214942
HighPow Impedance: 69 Ohm
Implantable Lead Connection Status: 753985
Implantable Lead Implant Date: 20240729
Implantable Lead Location: 753860
Implantable Pulse Generator Implant Date: 20240729
Lead Channel Impedance Value: 589 Ohm
Lead Channel Impedance Value: 627 Ohm
Lead Channel Pacing Threshold Amplitude: 0.375 V
Lead Channel Pacing Threshold Pulse Width: 0.4 ms
Lead Channel Sensing Intrinsic Amplitude: 7.1 mV
Lead Channel Setting Pacing Amplitude: 1.5 V
Lead Channel Setting Pacing Pulse Width: 0.4 ms
Lead Channel Setting Sensing Sensitivity: 0.3 mV
Zone Setting Status: 755011

## 2024-01-30 ENCOUNTER — Telehealth (HOSPITAL_COMMUNITY): Payer: Self-pay | Admitting: Cardiology

## 2024-01-30 ENCOUNTER — Telehealth (HOSPITAL_COMMUNITY): Payer: Self-pay

## 2024-01-30 ENCOUNTER — Ambulatory Visit: Payer: Self-pay | Admitting: Cardiology

## 2024-01-30 DIAGNOSIS — M19011 Primary osteoarthritis, right shoulder: Secondary | ICD-10-CM | POA: Diagnosis not present

## 2024-01-30 DIAGNOSIS — M25511 Pain in right shoulder: Secondary | ICD-10-CM | POA: Diagnosis not present

## 2024-01-30 NOTE — Telephone Encounter (Signed)
 Dr Shona with [facility] Called to review details of risk stratification  Reports pt attempted to have procedure done at [facility] recently and procedure was aborted since intervention cardiology was not present.   Dr Shona would like clarification on why procedure was aborted and what is needed at [facility] to proceeded? *see EP PeriOp Device Note*  Cell# [phone]

## 2024-02-06 ENCOUNTER — Ambulatory Visit (INDEPENDENT_AMBULATORY_CARE_PROVIDER_SITE_OTHER): Admitting: Internal Medicine

## 2024-02-06 DIAGNOSIS — M19011 Primary osteoarthritis, right shoulder: Secondary | ICD-10-CM | POA: Diagnosis not present

## 2024-02-12 DIAGNOSIS — M19011 Primary osteoarthritis, right shoulder: Secondary | ICD-10-CM | POA: Diagnosis not present

## 2024-02-13 ENCOUNTER — Other Ambulatory Visit (HOSPITAL_COMMUNITY): Payer: Self-pay | Admitting: Internal Medicine

## 2024-02-21 DIAGNOSIS — I10 Essential (primary) hypertension: Secondary | ICD-10-CM | POA: Diagnosis not present

## 2024-02-21 DIAGNOSIS — Z01818 Encounter for other preprocedural examination: Secondary | ICD-10-CM | POA: Diagnosis not present

## 2024-02-21 DIAGNOSIS — G8929 Other chronic pain: Secondary | ICD-10-CM | POA: Diagnosis not present

## 2024-02-21 DIAGNOSIS — E669 Obesity, unspecified: Secondary | ICD-10-CM | POA: Diagnosis not present

## 2024-02-21 DIAGNOSIS — M25512 Pain in left shoulder: Secondary | ICD-10-CM | POA: Diagnosis not present

## 2024-02-21 DIAGNOSIS — M7531 Calcific tendinitis of right shoulder: Secondary | ICD-10-CM | POA: Diagnosis not present

## 2024-02-21 DIAGNOSIS — M67813 Other specified disorders of tendon, right shoulder: Secondary | ICD-10-CM | POA: Diagnosis not present

## 2024-02-21 DIAGNOSIS — I272 Pulmonary hypertension, unspecified: Secondary | ICD-10-CM | POA: Diagnosis not present

## 2024-02-21 DIAGNOSIS — M25511 Pain in right shoulder: Secondary | ICD-10-CM | POA: Diagnosis not present

## 2024-02-21 DIAGNOSIS — I5020 Unspecified systolic (congestive) heart failure: Secondary | ICD-10-CM | POA: Diagnosis not present

## 2024-02-21 DIAGNOSIS — I110 Hypertensive heart disease with heart failure: Secondary | ICD-10-CM | POA: Diagnosis not present

## 2024-02-21 DIAGNOSIS — J449 Chronic obstructive pulmonary disease, unspecified: Secondary | ICD-10-CM | POA: Diagnosis not present

## 2024-02-21 DIAGNOSIS — G4733 Obstructive sleep apnea (adult) (pediatric): Secondary | ICD-10-CM | POA: Diagnosis not present

## 2024-02-21 DIAGNOSIS — E119 Type 2 diabetes mellitus without complications: Secondary | ICD-10-CM | POA: Diagnosis not present

## 2024-02-21 DIAGNOSIS — M19011 Primary osteoarthritis, right shoulder: Secondary | ICD-10-CM | POA: Diagnosis not present

## 2024-02-27 ENCOUNTER — Ambulatory Visit: Admitting: Nurse Practitioner

## 2024-02-28 DIAGNOSIS — G4733 Obstructive sleep apnea (adult) (pediatric): Secondary | ICD-10-CM | POA: Diagnosis not present

## 2024-02-28 DIAGNOSIS — J449 Chronic obstructive pulmonary disease, unspecified: Secondary | ICD-10-CM | POA: Diagnosis not present

## 2024-02-28 DIAGNOSIS — M0689 Other specified rheumatoid arthritis, multiple sites: Secondary | ICD-10-CM | POA: Diagnosis not present

## 2024-02-28 DIAGNOSIS — M75122 Complete rotator cuff tear or rupture of left shoulder, not specified as traumatic: Secondary | ICD-10-CM | POA: Diagnosis not present

## 2024-02-28 DIAGNOSIS — M19011 Primary osteoarthritis, right shoulder: Secondary | ICD-10-CM | POA: Diagnosis not present

## 2024-02-28 DIAGNOSIS — G8918 Other acute postprocedural pain: Secondary | ICD-10-CM | POA: Diagnosis not present

## 2024-02-28 DIAGNOSIS — I110 Hypertensive heart disease with heart failure: Secondary | ICD-10-CM | POA: Diagnosis not present

## 2024-02-28 DIAGNOSIS — M75101 Unspecified rotator cuff tear or rupture of right shoulder, not specified as traumatic: Secondary | ICD-10-CM | POA: Diagnosis not present

## 2024-02-28 DIAGNOSIS — M75121 Complete rotator cuff tear or rupture of right shoulder, not specified as traumatic: Secondary | ICD-10-CM | POA: Diagnosis not present

## 2024-02-28 DIAGNOSIS — I5020 Unspecified systolic (congestive) heart failure: Secondary | ICD-10-CM | POA: Diagnosis not present

## 2024-02-28 DIAGNOSIS — E119 Type 2 diabetes mellitus without complications: Secondary | ICD-10-CM | POA: Diagnosis not present

## 2024-02-28 NOTE — Anesthesia Procedure Notes (Signed)
 Arterial Line Placement 02/28/2024 1:15 PM Patient location: OR.  Patient prepped: chlorhexidine  gluconate Seldinger technique used. Laterality: left Site: radial Secured with: tape and transparent dressing Procedure performed using ultrasound guided technique. Permanent images not saved  Procedure Staff: Performing Provider: Laney Lonni Simmonds, MD Authorizing Provider: Tenny Redell Pac, MD  Performed by: fellow under supervision and attending  The teaching provider was present during the key and critical portions of this procedure and immediately available throughout the procedure.   _____________________________________________________________________________

## 2024-02-28 NOTE — Procedures (Signed)
 Operative Note   SURGERY DATE: 02/28/2024  PRE-OP DIAGNOSIS: Glenohumeral arthritis, right [M19.011]    POST-OP DIAGNOSIS: Post-Op Diagnosis Codes:    * Glenohumeral arthritis, right [M19.011]   Procedure(s): ARTHROPLASTY, TOTAL SHOULDER, REVERSE (Right) TENODESIS OF LONG TENDON OF BICEPS (Right) [id number]  SURGEON: Surgeons and Role:    * Klifto, Lonni Hamilton, MD - Primary    * Tressia Levan Ned, MD - Resident - Assisting    * Janita Schooner, Curlee Delaware, MD - Resident - Assisting  ASSISTANT(S): None  STAFF: Circulator: Heyward Lonni, RN; Jory Manson, RN Scrub Person: Dahonog, Randall Saha, RN; Malaluan, Jeron, RN  ANESTHESIA: GEN/Regional   INDICATION(S): INDICATIONS:  The patient is a 73 y.o.  year old male with Rotator Cuff Arthropathy that has failed conservative treatment who requested operative intervention for relief of symptoms. The risks,benefits, prognosis, alternatives including, but not limited to DVT, PE, infection, neurovascular injury, blood loss, death and failure of the procedure were explained to the patient and family, who are willing to proceed with the surgery.   OPERATIVE FINDINGS:  Intraoperatively, the patient was found to have Massive rotator cuff tear.     DESCRIPTION OF PROCEDURE:  The patient was identified by wrist bracelet. The surgical site was marked by the surgeon. Once again, the procedure and his postoperative rehabilitation were discussed with the patient and their significant others. Their questions were all answered.  After adequate anesthesia obtained, the patient was placed on the beach chair operating table in a supine position.  All bony prominences were padded and an arm holder was used.  Antibiotics were given within 1 hour of skin incision. The operative arm was then sterilely prepped and draped in the usual manner with ChloraPrep and alcohol. A preoperative time out was completed by the surgical team.  Sterile prep and  drape were performed. Ioban was placed on the skin.  The bony landmarks of the shoulder were marked with a marking pen. A delto-pectoral incision was made, extending approximately 10 cm. Cephalic vein was identified, retracted laterally, and protected throughout the case. Subdeltoid and subpectoral adhesions were bluntly released. Neurovascular structures were carefully protected. The deltoid was retracted laterally with a Brown humeral retractor. The conjoined tendon was identified. The cleido-pectoral fascia was excised. The axillary nerve was palpated and carefully protected throughout the procedure.  The biceps tendon was identified, cut and tenodesed to the pectoralis with a 2-0 Fiberwire. The rotator cuff interval was exploited following the bicipital groove up to the glenoid. The subscap was peeled from the humerus and tagged with 2-0 Fiberwire suture. The capsule was released off the humerus.  A 360 Degree release of the subscap was performed. The head was delivered by external rotation and elevation from the wound. The capsule was released from the humerus. The humeral head cutting guide was pinned in to appropriate position. The humeral head was cut ending at the rotator cuff insertion. Osteophytes were removed from the humerus. The humerus was then reamed and broached up until there was resistance and torsional stability. The humeral canal was checked for perforation and fracture and noted to have neither. A trial stem was placed.  The arm was then positioned slightly forward flexion and external rotation to expose the glenoid. The bankart retractor was utilized posteriorly . A Hohman retractor was placed superiorly behind the biceps tendon insertion. A Bankart retractor was placed anteriorly on the glenoid neck. A 360-degree release of the capsule was performed with cautery. The axillary nerve was carefully protected with  the surgeon's index finger retracting it and using cautery. A guidepin was  placed 15 mm above the inferior portion of the glenoid and with 10 caudal tilt using a guide . Once adequate release was performed , the glenoid was gently reamed down to cortical bone. It was subsequently drilled with the central peg hole drill. The peg hole was drilled in a bicortical fashion. The baseplate was then carefully impacted into position.  The baseplate screw holes were then drilled and measured and the appropriate length locking screws were placed with excellent rigid fixation of the baseplate. A 5 mm lateralized glenosphere was inserted and found to be stable.  The final humeral implant was selected and press fit into place after first trialing the stem with a several size humeral inserts. There appeared to be acceptable stability and range of motion. No impingement was noted with adduction internal rotation extension or in other positions. Appropriate tension was noted on the conjoined tendon and deltoid muscle. Extension was stable, external and internal rotation as well. The wound was inspected and was clean. The wound was then irrigated with dilute betadine  solution and normal saline. Vancomycin  and tobramycin powder was placed into the wound. The subscap was meticulously repaired with 3  sutures through bone drill holes and multiple FiberWire sutures in the tenotomized tendon. Cautery was used for hemostasis which was appropriate prior to closure. Implant position and appropriate reduction were confirmed with intraoperative fluoroscopy. The wound was then closed in layers with 0 Vicryl running in the deltopectoral fascia followed by 2-0 Vicryl in the deep dermal layer. Skin was closed with staples and dressed with aquacel dressing. A shoulder abduction brace and polar care were applied. All counts were correct at the end of the case.   The patient was then awakened and taken to the recovery room in stable condition.    ESTIMATED BLOOD LOSS:    300 mL from 02/28/2024  2:05 PM to 02/28/2024   3:43 PM      TOTAL IV FLUIDS: Per anesthesia  SPECIMENS:  * No specimens in log *   IMPLANTS:  Implant Name Type Inv. Item Serial No. Manufacturer Lot No. LRB No. Used Action  AETOS SHOULDER SYSTEM BASEPLATE, AUGMENTED 15 FW    [employer] [id number] Right 1 Implanted  GLENOSPHERE, AETOS CONCENTRIC - [id number]  GLENOSPHERE, AETOS CONCENTRIC  [employer] [id number] Right 1 Implanted  STEM, AETOS META HUMERAL SZ3 - [id number]  STEM, AETOS META HUMERAL SZ3  [employer] [id number] Right 1 Implanted  LINER, AETOS REVERSE LARGE +0MM - [id number]  LINER, AETOS REVERSE LARGE +0MM  [employer] [id number] Right 1 Implanted  SCREW, CENTER 4.5X40MM - [id number]  SCREW, CENTER 4.5X40MM  [employer]  Right 1 Implanted  SCREW, LOCKING 4.5X35MM - [id number]  SCREW, LOCKING 4.5X35MM  [employer]  Right 1 Implanted  SCREW, LOCKING 4.5X20MM - [id number]  SCREW, LOCKING 4.5X20MM  [employer]  Right 1 Implanted  SCREW, LOCKING 4.5X15MM - [id number]  SCREW, LOCKING 4.5X15MM  [employer]  Right 1 Implanted     COMPLICATIONS: None  DISPOSITION: PACU - hemodynamically stable.  ATTESTATION:  FR- Surgery (Key Portions) - I was personally present during the key and critical portions of this procedure and immediately available throughout the entire procedure.  I have reviewed and agree with the procedural note as documented by the resident (FR).

## 2024-02-28 NOTE — Anesthesia Preprocedure Evaluation (Signed)
 PASS-Perioperative Anesthesia & Surgical Screening  ALERTS:   Sleep apnea   Procedure:    Date/Time: 02/28/24 1100   Procedures:      ARTHROPLASTY, TOTAL SHOULDER, REVERSE (Right: Shoulder)     TENODESIS OF LONG TENDON OF BICEPS (Right: Arm Upper)   Location: [facility] 04 / [facility]   Surgeons: Klifto, Lonni Hamilton, MD      CC/HPI  Erik Black is a 73 y.o. male   Cardiac workup noted Discussed management of ICD with medtronic   Relevant Problems  No relevant active problems   Vitals (36hrs): Temp:  [36.6 C (97.8 F)] 36.6 C (97.8 F) Heart Rate:  [74-75] 74 Resp:  [13-19] 19 BP: (121-125)/(62-70) 121/62 SpO2:  [99 %-100 %] 99 % Height:  [182.9 cm (6')] 182.9 cm (6') Weight:  [125.6 kg] 125.6 kg BMI (Calculated):  [37.53] 37.53 Relevant Risk Scores: STOP BANG: 4 [facility] Activity Status Index (DASI): 28.7 Mini-COG: 5 Medical History                                    Pulmonary .  Sleep apnea: .  COPD:  Cardiovascular  .  HTN: Rheumatology .  Rheumatoid arthritis  Endocrine .  Diabetes:   Pain .  Chronic pain: Psych . Depression    Review of Systems   A comprehensive 10-system ROS was asked and was negative except for the following:    Past Surgical History   Past Surgical History:  Procedure Laterality Date  . INSERT / REPLACE / REMOVE PACEMAKER    . JOINT REPLACEMENT     Knees hips  . KNEE ARTHROSCOPY    . VASECTOMY      Social/Family History   Social History   Occupational History  . Not on file  Tobacco Use  . Smoking status: Former    Types: Cigarettes, Pipe, Software engineer  . Smokeless tobacco: Not on file  Vaping Use  . Vaping status: Never Used  Substance and Sexual Activity  . Alcohol use: Not Currently  . Drug use: Not Currently  . Sexual activity: Not Currently    Partners: Female   Vaping/E-Cigarettes  . Vaping/E-Cigarette Use Never User   . Start Date    . Cartridges/Day    . Quit Date     Family History   Problem Relation Age of Onset  . Diabetes type II Mother   . Heart disease Mother   . High blood pressure (Hypertension) Mother   . Alcohol abuse Father   . Asthma Father   . Heart disease Father   . High blood pressure (Hypertension) Father   . Alcohol abuse Brother   . Colon cancer Brother   . High blood pressure (Hypertension) Brother   . Prostate cancer Brother   . Stroke Brother   . Alcohol abuse Brother   . Colon cancer Brother   . Heart disease Brother   . High blood pressure (Hypertension) Brother   . Colon cancer Brother   . High blood pressure (Hypertension) Brother        I'm  . High blood pressure (Hypertension) Brother   . Anesthesia problems Neg Hx    Allergies   Allergies  Allergen Reactions  . Empagliflozin Other (See Comments) and Rash    Hypersensitivity reaction  empagliflozin  . Methadone Itching  . Oxycodone  Itching    Hands started peeling and could not swallow  oxycodone   .  Lisinopril Cough   Medications   Current Facility-Administered Medications  Medication Dose Route Frequency Last Rate Last Admin  . dextrose  50% in water  solution 12.5-25 g  12.5-25 g Intravenous As Directed      . glucagon (GLUCAGEN) injection 1 mg  1 mg Intramuscular As Directed      . sodium chloride  0.9% flush inj syringe 5 mL  5 mL Intravenous As Directed      . lidocaine  (XYLOCAINE ) 1 % injection 0.5-1 mL  0.5-1 mL Subcutaneous Once PRN      . fentaNYL  (PF) (SUBLIMAZE ) injection 100 mcg  100 mcg Intravenous Q5 Min PRN      . midazolam  (VERSED ) 1 mg/mL injection 2 mg  2 mg Intravenous Q5 Min PRN      . BUPivacaine  liposome (PF) (EXPAREL ) 1.3 % (13.3 mg/mL) injection 266 mg  266 mg Infiltration Once       Physical Exam  Phys Exam Test Results    Lab Results  Component Value Date   WBC 5.9 02/21/2024   HGB 14.8 02/21/2024   HCT 46.3 02/21/2024   PLT 128 (L) 02/21/2024   ALT 17 02/21/2024   AST 19 02/21/2024   GLUCOSE 156 (H) 02/21/2024   NA 137 02/21/2024    K 4.4 02/21/2024   CL 105 02/21/2024   CALCIUM  9.6 02/21/2024   CREATININE 1.7 (H) 02/21/2024   BUN 17 02/21/2024   CO2 24 02/21/2024   ALB 3.9 02/21/2024   HGBA1C 6.7 (H) 02/21/2024   VITD 39 02/21/2024       Creat  (Last 365 days)      08/22 1059   Creat           1.7*             A1C  (Last 365 days)      08/22 1059   A1C           6.7*             Patient Instructions   Problem List   Patient Active Problem List  Diagnosis  . Pulmonary HTN (CMS/HHS-HCC)  . Chronic pain of both shoulders  . HFrEF (heart failure with reduced ejection fraction) (CMS/HHS-HCC)  . Diabetes mellitus type 2, insulin  dependent (CMS/HHS-HCC)  . BMI 36.0-36.9,adult  . Essential hypertension  . OSA (obstructive sleep apnea)  . Rheumatoid arthritis involving multiple sites (CMS/HHS-HCC)  . Vitamin D  insufficiency  . Preoperative evaluation to rule out surgical contraindication  . COPD (chronic obstructive pulmonary disease) (CMS/HHS-HCC)   Assessment & Plan  Plan  Day of Surgery  Airway: Mallampati: III - soft palate, base of uvula visible. TM distance: >3 FB. Mouth opening: Normal. Neck ROM: Full. Dental: Poor dentition. Simple airway Cardiovasc: Rhythm: Regular. Rate: Normal.    Plan ASA: 4 Plan: general, regional.  Postoperative Opioids Intended? Yes Induction: IVI/RSI.  Maintenance: Inhalational.  We have personally spoken to the patient or authorized representative at length about the potential risks associated with anesthesia and associated procedures that include, but are not limited to: nausea, vomiting, pain, injuries to the structures of the oropharynx/teeth/vocal cords, eye injury, stroke or other brain and neurological injury or dysfunction, lung and respiratory injury or dysfunction, peri-operative heart attack or other cardiovascular injury or dysfunction, and death.  All of the patient or surrogate's questions were answered.  The patient or surrogate is  in a position to make an informed decision about their further care, understands all options with their risks and  benefits, and wishes to proceed.  We have personally spoken to the patient or authorized representative at length about the additional risks associated with neuraxial and/or regional anesthesia that include, but are not limited to: bleeding, infection, injury to structures near which the needle may pass which include but are not limited to skin, muscles, blood vessels, nerves, or other nearby organs.  We further discussed the risk of neurological injury which may include but is not limited to temporary or permanent sensory and motor dysfunction and pain.  All of the patient or surrogate's questions were answered.  The patient or surrogate is in a position to make an informed decision about their further care, understands all options with their risks and benefits, and wishes to proceed.   Patient identification and NPO status confirmed, chart reviewed (medical history, including anesthesia, drug, and allergy history), and patient examined. Anesthesia care plan, including plan for postoperative pain control, analgesia regimen, and postoperative disposition discussed with patient and/or parent/legal guardian prior to start of anesthesia care. Patient and/or parent/legal guardian understand that there are anesthetic risks associated with this procedure and wishes to proceed..                                                                                                                                                                                                                   *Some images could not be shown.

## 2024-02-28 NOTE — H&P (Signed)
 The H&P has been reviewed and the patient has been examined. There is no change in the overall  assessment and no contraindication for surgery. Cardiovascular: A limited cardiovascular and respiratory assessment is performed. There is a normal, non-labored breathing pattern and no auditory respiratory issue as well as no notable cardiac issue to my level of expertise.            Erik Black, M.D. Erik Dodie, NP-C [facility]     [name] VISIT    Reason for Visit:       Chief Complaint  Patient presents with  . Right Shoulder - Pain      History of Present Illness:  History of Present Illness Erik Black is a 73 year old male with rotator cuff issues who presents with bilateral shoulder pain.   He experiences severe bilateral shoulder pain, rated between eight to nine and a half out of ten, described as agonizing. The pain affects both shoulders equally, significantly limiting daily activities. He has limited range of motion in his shoulders, tingling radiating down his arms to his hands, difficulty sleeping, and pain extending to his neck. These symptoms have been consistent. He has undergone several diagnostic studies, including CT scans, MRIs, and x-rays. Previous surgery was postponed due to cardiac concerns. He takes diabetic injections and low-dose aspirin  twice daily. He is concerned about the impact of his defibrillator on his treatment.     Past Medical History:   Past Medical History      Past Medical History:  Diagnosis Date  . Depression    . Diabetes mellitus without complication (CMS/HHS-HCC)    . Heart disease      Two years ago  . Hematologic abnormality      81mg   . Hypertension 25years  . Rheumatoid arthritis (CMS/HHS-HCC)    . Sleep apnea          Past Surgical History: Past Surgical History       Past Surgical History:  Procedure Laterality Date  . INSERT / REPLACE / REMOVE PACEMAKER      .  JOINT REPLACEMENT        Knees hips  . KNEE ARTHROSCOPY      . VASECTOMY            Current Medications:  Current Medications        Current Outpatient Medications  Medication Sig Dispense Refill  . acetaminophen  (TYLENOL ) 500 MG tablet Take 1,000 mg by mouth      . amLODIPine  (NORVASC ) 10 MG tablet Take 10 mg by mouth once daily      . amoxicillin (AMOXIL) 500 MG tablet Take 500 mg by mouth 3 (three) times daily      . aspirin  81 MG EC tablet Take 81 mg by mouth 2 (two) times daily      . atorvastatin  (LIPITOR) 40 MG tablet Take 40 mg by mouth once daily      . brinzolamide -brimonidine  1-0.2 % DrpS Place 1 drop into both eyes 3 (three) times daily      . budesonide -glycopyrrolate-formoterol  (BREZTRI AEROSPHERE) 160-9-4.8 mcg/actuation inhaler Inhale 2 inhalations into the lungs 2 (two) times daily      . carvediloL  (COREG ) 12.5 MG tablet Take 12.5 mg by mouth 2 (two) times daily      . celecoxib (CELEBREX) 200 MG capsule Take 200 mg by mouth once daily      . cholecalciferol  (VITAMIN D3) 2,000 unit tablet Take 50 mcg by  mouth once daily      . COLACE 100 mg capsule Take 100 mg by mouth 2 (two) times daily      . diethylpropion 75 mg TbER Take 1 tablet by mouth once daily      . doxazosin  (CARDURA ) 8 MG tablet Take 8 mg by mouth at bedtime      . DULoxetine  (CYMBALTA ) 60 MG DR capsule Take 60 mg by mouth once daily      . FUROsemide  (LASIX ) 20 MG tablet Take 20 mg by mouth once daily      . hydroCHLOROthiazide  (HYDRODIURIL ) 25 MG tablet Take 25 mg by mouth once daily      . HYDROcodone-acetaminophen  (NORCO) 5-325 mg tablet Take 1 tablet by mouth every 8 (eight) hours as needed for Pain      . LANTUS  SOLOSTAR U-100 INSULIN  pen injector (concentration 100 units/mL) Inject subcutaneously once daily      . latanoprost  (XALATAN ) 0.005 % ophthalmic solution Place 1 drop into both eyes at bedtime      . losartan  (COZAAR ) 100 MG tablet Take 100 mg by mouth once daily      . metoprolol   TARTrate (LOPRESSOR ) 50 MG tablet Take 50 mg by mouth 2 (two) times daily      . mexiletine (MEXITIL ) 200 MG capsule Take 200 mg by mouth 2 (two) times daily      . mometasone  200 mcg/actuation HFAA        . multivitamin with minerals tablet Take 1 tablet by mouth once daily      . omega-3 fatty acids 1,000 mg capsule Take 1,000 mg by mouth once daily      . omeprazole (PRILOSEC) 20 MG DR capsule Take 20 mg by mouth 2 (two) times daily      . ondansetron  (ZOFRAN -ODT) 4 MG disintegrating tablet Take 4 mg by mouth every 8 (eight) hours as needed for Nausea      . OXcarbazepine  (TRILEPTAL ) 150 MG tablet Take 150 mg by mouth every 12 (twelve) hours      . potassium chloride  (KLOR-CON  M20) 20 MEQ ER tablet Take 20 mEq by mouth once daily      . semaglutide  (OZEMPIC ) 1 mg/dose (4 mg/3 mL) pen injector Inject 1 mg subcutaneously once a week      . spironolactone  (ALDACTONE ) 25 MG tablet Take 25 mg by mouth once daily      . timolol  (TIMOPTIC -XE) 0.5 % ophthalmic gel-forming solution Place 1 drop into both eyes once daily      . tiotropium-olodateroL (STIOLTO RESPIMAT) 2.5-2.5 mcg/actuation inhaler Inhale 2 inhalations into the lungs once daily      . TORsemide  (DEMADEX ) 20 MG tablet Take 40 mg by mouth once daily      . traMADoL (ULTRAM) 50 mg tablet Take 50 mg by mouth every 8 (eight) hours as needed      . vericiguat  (VERQUVO ) 2.5 mg tablet Take 2.5 mg by mouth once daily        No current facility-administered medications for this visit.        Allergies:  Allergies       Allergies  Allergen Reactions  . Empagliflozin Other (See Comments) and Rash      Hypersensitivity reaction   empagliflozin  . Methadone Itching  . Oxycodone  Itching      Hands started peeling and could not swallow   oxycodone   . Lisinopril Cough        Focused Physical Examination: Ht 182.9  cm (6')   Wt (!) 121.6 kg (268 lb)   BMI 36.35 kg/m    Shoulder Examination: Physical Exam MUSCULOSKELETAL: Right  shoulder forward elevation to 120 degrees, external rotation to 50 degrees, internal rotation to chest.   4-view (AP, grashey, Y-view, axillary) x-ray right shoulder ordered and personally reviewed in clinic today.  They reveal no acute fractures or dislocations. They show rotator cuff arthropathy   Assessment and Plan:     ICD-10-CM  1. Primary osteoarthritis of right shoulder  M19.011      I had an extensive discussion with the patient today regarding the nature of right shoulder rotator cuff arthropathy including signs and symptoms of the condition, general considerations for causation and progression of the condition, and strategies for treatment.  Treatment options were discussed with the patient ranging from most conservative to invasive management options. The indications and nature of surgical versus nonsurgical options have been discussed including the rationale for various surgical treatments.  The inherent risks and benefits of each treatment including non operative management have been reviewed and prognosis for recovery (including recurrence) and typical postoperative protocols have been explained.  The patient verbalizes sound understanding of our discussion.   Assessment & Plan Bilateral complete rotator cuff tear with chronic shoulder pain Chronic bilateral shoulder pain with limited range of motion, tingling, and neck pain. Significant functional impairment and anxiety. Surgical intervention planned. Risks including dislocations, fractures, and future surgeries explained. - Schedule surgery for last weekend of August or first weekend of September. - Ensure one-night hospital stay post-surgery for monitoring due to cardiac history. - Obtain medical clearance from cardiology prior to surgery. - Fit for a sling post-surgery. - Review and adjust diabetic medication and aspirin  regimen in preparation for surgery.   IFZ:Ejupzwu was prescribed a Shoulder Immobilizer today for the  diagnosis listed above under assessment. The patient requires stabilization from this orthosis in their right shoulder.    Patient is in agreement with above-stated treatment plan.  All questions answered.  Patient may call the clinic at any time with further questions or concerns.         Attestation Statement:    I personally performed the service, non-incident to. (WP)    Erik GLENDIA HERO, MD This note has been created using automated tools and reviewed for accuracy by Erik SCOTT Black. Answers submitted by the patient for this visit: Right Shoulder HPI Questionnaire (Submitted on 02/11/2024) Chief Complaint: HPI - Right Shoulder How did your symptoms begin?: gradually with injury How long ago did your symptoms begin?: 35 Years Please indicate all symptoms related to your issue: catching, giving way, grinding sensation, instability, pain, weakness Where is your pain located? (select all that apply): right arm, left arm, right shoulder, left shoulder Please select what best describes your pain (choose all that apply): aching, radiating, sharp, shooting, stabbing, throbbing Do you have neck pain?: Yes Do you have pain that travels to the fingers of the affected shoulder?: Yes Please choose the severity that best describes your pain: marked (major pain with significant limitations) Rate the pain on a scale of 0 (no pain) to 10 (worst pain ever): 9/10 How often does the pain occur?: constant Since the onset of your problem, has the pain improved, worsened, or stayed the same?: worsening Does your shoulder pain prevent you from performing your hobbies?: Yes Are you able to perform sports at the same level before the injury?: No Do you lift a lot of weight with your shoulders?: No  Does your shoulder feel stable?: No Do your symptoms wake you from sleep?: Yes Please choose all the items that improve your symptoms: exercise, ice, injections, muscle relaxants, physical  therapy, rest Please choose all the items that worsen your symptoms: normal daily activities, activities with arm at side, activity in general, carrying heavy objects, getting dressed, overhead activity, reaching behind back, sleeping Please indicate any studies you've had for this problem (select all that apply): CT scan, MRI, X-ray(s) Please select all prior treatments you have had for this problem: heat/ice, injection(s), medications, physical therapy, sling, surgery Injury-related questions (Submitted on 02/11/2024) When was the date of your injury?: 07/10/1972 Is this a work-related injury?: No Have you filed a worker's comp claim?: No Questionnaire about: HPI - Right Shoulder (Submitted on 02/11/2024) Chief Complaint: HPI - Right Shoulder

## 2024-02-28 NOTE — Procedures (Signed)
(  Right) ARTHROPLASTY, TOTAL SHOULDER, REVERSE (Shoulder), (Right) TENODESIS OF LONG TENDON OF BICEPS (Arm Upper)  Procedure Note    Kanton Kamel 02/28/2024   Pre-op Diagnosis: Glenohumeral arthritis, right [M19.011]     Post-op Diagnosis:  Post-Op Diagnosis Codes:    * Glenohumeral arthritis, right [M19.011]  Procedure(s): ARTHROPLASTY, TOTAL SHOULDER, REVERSE (Right) TENODESIS OF LONG TENDON OF BICEPS (Right)   SURGEON: Surgeons and Role:    * Klifto, Lonni Hamilton, MD - Primary    * Tressia Levan Ned, MD - Resident - Assisting    * Janita Schooner, Curlee Delaware, MD - Resident - Assisting  Assistant(s): None  Anesthesia: GEN/Regional  Staff:   Circulator: Heyward Lonni, RN; Jory Manson, RN Scrub Person: Dahonog, Randall Saha, RN; Malaluan, Jeron, RN  Estimated Blood Loss:    300 mL from 02/28/2024  2:05 PM to 02/28/2024  3:43 PM             Specimens:  Order Name Source Comment Collection Info Order Time  TYPE AND SCREEN   Collected By: Ellie Roots, RN 02/28/2024 11:09 AM    Release to patient   Immediate                Drains:        LEVAN NED TRESSIA   Date: 02/28/2024  Time: 3:52 PM

## 2024-02-28 NOTE — Anesthesia Procedure Notes (Signed)
 Airways Date/Time: 02/28/2024 1:24 PM Reason: elective  Airway not difficult  General Information and Staff  Patient location during procedure: OR  Procedure Staff: Performing Provider: Laney Lonni Simmonds, MD Authorizing Provider: Tenny Redell Pac, MD  Performed by: attending and fellow under supervision  The teaching provider was present during the key and critical portions of this procedure and was immediately available throughout the entire procedure.  Indications and Patient Condition Indications for airway management: anesthesia Rapid Sequence Induction: No   Preoxygenated: yesPatient position: .sniffing   Final Airway Details  Final airway type: endotracheal airway  Endotracheal/SGA details:  ETT  Cuffed: yes  Successful intubation technique: direct laryngoscopy Endotracheal tube insertion site: oral Blade: .MacIntosh Blade size: #3 ETT size (mm): 7.5 Cormack-Lehane Classification: grade I - full view of glottis Placement verified by: chest auscultation and capnometry  Initial leak: Yes  Measured from: lips ETT to lips (cm): 23 Number of attempts at approach: 1

## 2024-02-29 DIAGNOSIS — E119 Type 2 diabetes mellitus without complications: Secondary | ICD-10-CM | POA: Diagnosis not present

## 2024-02-29 DIAGNOSIS — M0689 Other specified rheumatoid arthritis, multiple sites: Secondary | ICD-10-CM | POA: Diagnosis not present

## 2024-02-29 DIAGNOSIS — G4733 Obstructive sleep apnea (adult) (pediatric): Secondary | ICD-10-CM | POA: Diagnosis not present

## 2024-02-29 DIAGNOSIS — I110 Hypertensive heart disease with heart failure: Secondary | ICD-10-CM | POA: Diagnosis not present

## 2024-02-29 DIAGNOSIS — M75121 Complete rotator cuff tear or rupture of right shoulder, not specified as traumatic: Secondary | ICD-10-CM | POA: Diagnosis not present

## 2024-02-29 DIAGNOSIS — J449 Chronic obstructive pulmonary disease, unspecified: Secondary | ICD-10-CM | POA: Diagnosis not present

## 2024-02-29 DIAGNOSIS — M75122 Complete rotator cuff tear or rupture of left shoulder, not specified as traumatic: Secondary | ICD-10-CM | POA: Diagnosis not present

## 2024-02-29 DIAGNOSIS — I5020 Unspecified systolic (congestive) heart failure: Secondary | ICD-10-CM | POA: Diagnosis not present

## 2024-02-29 DIAGNOSIS — M19011 Primary osteoarthritis, right shoulder: Secondary | ICD-10-CM | POA: Diagnosis not present

## 2024-02-29 NOTE — Care Plan (Signed)
  Problem: Safety Goal: Free from accidental physical injury Outcome: Met/ Completed   Problem: Daily Care Goal: Daily care needs are met Description: Assess and monitor ability to perform self care and identify potential discharge needs. Outcome: Met/ Completed   Problem: Pain Goal: Patient's pain/discomfort is manageable Description: Assess and monitor patient's pain using appropriate pain scale. Collaborate with interdisciplinary team and initiate plan and interventions as ordered. Re-assess patient's pain level 30 - 60 minutes after pain management intervention.  Outcome: Met/ Completed   Problem: Discharge Barriers Goal: Patient's discharge needs are met Description: Collaborate with interdisciplinary team and initiate plans and interventions as needed.  Outcome: Met/ Completed   Problem: Compromised Skin Integrity Goal: Skin integrity is maintained or improved Description: Assess and monitor skin integrity. Identify patients at risk for skin breakdown on admission and per policy. Collaborate with interdisciplinary team and initiate plans and interventions as needed. Outcome: Met/ Completed   Problem: Knowledge deficit: Goal: Patient/caregiver ability to state and carry out methods to decrease the pain will improve Description:      Outcome: Met/ Completed Note: Patient satisfied with pain goal. Pain Intervention(s): Medication (Non opioid given)  Goal: Patient/caregiver ability to identify factors that increase the pain will improve Outcome: Met/ Completed Note: Patient satisfied with pain goal. Pain Intervention(s): Medication (Non opioid given)    Problem: Discharge Barriers Goal: Patient's discharge needs are met Description: Collaborate with interdisciplinary team and initiate plans and interventions as needed.  Outcome: Met/ Completed   Problem: Altered ability to manage pain: Goal: Pain level will decrease Outcome: Met/ Completed Note: Patient satisfied  with pain goal. Pain Intervention(s): Medication (Non opioid given)  Goal: Satisfaction with pain management regimen will improve Outcome: Met/ Completed Note: Patient satisfied with pain goal. Pain Intervention(s): Medication (Non opioid given)  Goal: Patient's ability to cope will be supported Outcome: Met/ Completed Note: Patient satisfied with pain goal. Pain Intervention(s): Medication (Non opioid given)    Problem: Physical Mobility Impairment: Goal: Ability to perform activities of daily living will improve Outcome: Met/ Completed Goal: Ability to ambulate will improve Outcome: Met/ Completed Goal: Ability to safely and independently change position in bed will improve Outcome: Met/ Completed Goal: Range of joint motion will improve by discharge Outcome: Met/ Completed

## 2024-02-29 NOTE — Progress Notes (Signed)
 Physical Therapy Initial Evaluation  Patient Name:  Erik Black Date of Evaluation: 02/29/24 Time of Evaluation:  0836 Duration of Session:  48 Minutes Room/Bed: 9A02/9A02-01  Precautions: Falls Risk, Weight bearing      Weight Bearing status: Non weight bearing on  right upper extremity   Assessment: Patient requires SBA for all functional mobility.   Pt cleared for discharge from a functional mobility stand point.  DC from PT. The pt will need OP PT when cleared to do so by MD.  Limiting factors to progression may include pain and decreased endurance.    Recommendations for mobility with nursing:  Use BMAT score and associated clinical judgement to determine safe mobility on a daily basis as patient status may be subject to change.  Pt cleared fro ambulation into the bathroom and in the hallway with Nursing/Family with SBA.  Discharge Recommendations: Is the patient safe to discharge to the recommended disposition? Yes Discharge Recommendations: Home, Outpatient PT DME Recommendations    Flowsheet Row Most Recent Value  DME Recommendations None Filed at 02/29/2024 9163     Complete details of today's session:Chart reviewed and communication with nursing completed.  The patient was supine in bed when PT arrived.  Evaluation initiated and completed per flow.  Please see flow below for details.  At end of session, the patient was left seated in recliner at bedside, with all needs in reach, with nurse call device in reach, with family present. His status was communicated to the RN, Patient, Family.   Reason for Admission: Erik Black is a 73 y.o. male admitted on 02/28/2024 Erik Black is a 73 year old male with rotator cuff issues who presents with bilateral shoulder pain.   He experiences severe bilateral shoulder pain, rated between eight to nine and a half out of ten, described as agonizing. The pain affects both shoulders equally, significantly limiting  daily activities. He has limited range of motion in his shoulders, tingling radiating down his arms to his hands, difficulty sleeping, and pain extending to his neck. These symptoms have been consistent. He has undergone several diagnostic studies, including CT scans, MRIs, and x-rays. Previous surgery was postponed due to cardiac concerns. He takes diabetic injections and low-dose aspirin  twice daily. He is concerned about the impact of his defibrillator on his treatment.   He has a past medical history of Depression, Diabetes mellitus without complication (CMS/HHS-HCC), Heart disease, Hematologic abnormality, Hypertension (25years), Rheumatoid arthritis (CMS/HHS-HCC), and Sleep apnea.     02/29/24 0836  Discipline Timestamp  Discipline Timestamp PT  Documentation Type     Documentation Type                                E,R, T   Initial Assessment  Patient Subjective Information  Patient Subjective Information Complains of pain;Patient agreeable to therapy  Patient Profile/Social History  Social history source                                Patient;Spouse  Patient lives                           with spouse  Admitted from Home  Number of falls in the last 3 months 0  Prior Level of Function Independent community mobility without an assistive device  Home Activity /  Exercise Yes       Comment yard work, Research scientist (physical sciences) received prior to admission No  Assistance available after discharge Family;As needed  Home Environment  Type of Home House  Home Layout One level  Access Exterior stairs       Exterior stairs - number of steps 3       Exterior stairs - rails Both sides  Bathroom Shower/Tub Walk-in shower  Bathroom Toilet Raised  Home Equipment/DME Available For Use  Research officer, trade union type Rollator  Precautions  Precautions Falls Risk;Weight bearing       Weight Bearing status NWB RUE  Patient/Family Goals  Patient/Family  Goals Go home;Pain to stop  Pain Assessment  Pain Assessment %% 0-10  Pain Score %% Eight  Pain Type Acute pain  Pain Loc ARM  Pain Orientation Right  Pain Descriptors Aching  Pain Frequency Constant/continuous  Pain Onset On-going  Clinical Progression Gradually improving  Pain Goal 4  Non-Pharmacological Intervention(s) Active listening;Ambulation/increased activity;Repositioned;Rest  Pain Intervention Response %% Decreased physiological signs of pain;Patient satisfied with pain management  Review of Systems Cardiovascular/Pulmonary Function  SpO2 95 %  Any supplemental oxygen? No  Review of Systems - Cognition                         Overall Cognitive Status WNL  Arousal/Alertness Alert  Orientation X 4  Attention Span Appears intact  Following Commands Consistently  Behavior Cooperative;Motivated  Safety Judgment Good awareness of safety precautions  Review of Systems- Musculoskeletal  RLE Assessment WFL  LLE Assessment WFL  Balance  Sitting/Standing Balance  Sitting Static;Standing Static;Standing Dynamic       Sitting Balance Surface Hospital Bed       Static Sitting Support Needed Feet supported       Static Sitting Balance Assist Required Independent  Mobility  Bed Mobility  Supine to Sit;Sit to Stand;Stand to Sit       Supine to Sit Assistance Stand by assist       Supine to Sit Details Verbal cues;Set up;Requires extra time       Number of People Required 1       Sit to Stand Assistance Stand by assist       Sit to Stand Details Verbal cues       Number of People Required 1       Stand to Sit Assistance Stand by assist       Stand to Sit Details Verbal cues       Number of People Required 1  Ambulation Yes       Ambulation Assistance Stand by assist       Number of People Required 1  Distance ambulated in feet 200 feet  Ambulation Assistive Device None  Stairs/Curb assessed Yes  Rails  Left rail going up;Left rail going down  Assistance  Contact Guard  Assist (Hand Touch)  Stair Pattern Alternating pattern  AM-PAC 6 Clicks Basic Mobility Inpatient Short Form  Turning from your back to your side while in a flat bed without using bedrails? 4-None  Moving from lying on your back to sitting on the side of a flat bed without using bedrails? 4-None  Moving to and from a bed to a chair (including a wheelchair)? 4-None  Standing up from a chair using using your arms (e.g,. wheelchair, or bedside chair)? 4-None  To walk  in hospital room? 4-None  Climbing 3-5 steps with a railing? 3-A Little  AM-PAC Basic Mobility Raw Score 23  AM-PAC Basic Mobility t-Scale Score 50.88  AM-PAC Basic Mobility G-Code Modifier CI  Patient Status At End of Session  Status Communicated to: RN;Patient;Family  Pt Left: seated in recliner at bedside;with all needs in reach;with nurse call device in reach;with family present  Assessment  PT Enhancers Good family support/resources;Motivated;Accessible home;Intact cognition;Previous level of function/active lifestyle  PT Barriers Pain;Decreased activity tolerance/medically complex/comorbidities  Impairments/Functional Limitations    Pain  Rehab Potential   N/A  Patient safe for DC/PT perspective? Yes  Summary of Findings  Safe to return home;The patient is ready for discharge from a therapy perspective. Discharge therapy;Patient is safe to transfer with nursing assistance;Patient is safe to ambulate with nursing assistance  Plan  PT Frequency Patient discharged from PT;Evaluation only  Discharge Recommendation ([facility]) Home;Outpatient PT  DME Recommendations None  Plan(Progress Note) Discontinue physical therapy       PT Goals     * PT Goals (Active)     There are no active problems.             * PT Goals (Resolved)     There are no resolved problems.             Please see patient education record for PT teaching completed today.   Dusty Rippelmeyer, PT    *Some images could not be  shown.

## 2024-02-29 NOTE — Progress Notes (Signed)
 Discharge instructions given and AVS, verbalized understood. He is alert and oriented; vitally stable. PIV was removed; intact. He was instructed to clarify with his cardio doctor regarding the Torsemide  and Furosemide  as it was in his AVS  both but according to the wife he is not taking the Furosemide . Patient will coordinate with the doctor who prescribe if he will take it together. He was discharged and accompanied by the transport team with his belongings.

## 2024-02-29 NOTE — Care Plan (Signed)
  Problem: Safety Goal: Free from accidental physical injury Outcome: Progressing   Problem: Daily Care Goal: Daily care needs are met Description: Assess and monitor ability to perform self care and identify potential discharge needs. Outcome: Progressing   Problem: Pain Goal: Patient's pain/discomfort is manageable Description: Assess and monitor patient's pain using appropriate pain scale. Collaborate with interdisciplinary team and initiate plan and interventions as ordered. Re-assess patient's pain level 30 - 60 minutes after pain management intervention.  Outcome: Progressing   Problem: Compromised Skin Integrity Goal: Skin integrity is maintained or improved Description: Assess and monitor skin integrity. Identify patients at risk for skin breakdown on admission and per policy. Collaborate with interdisciplinary team and initiate plans and interventions as needed. Outcome: Progressing   Problem: Discharge Barriers Goal: Patient's discharge needs are met Description: Collaborate with interdisciplinary team and initiate plans and interventions as needed.  Outcome: Progressing   Problem: Knowledge deficit: Goal: Patient/caregiver ability to state and carry out methods to decrease the pain will improve Description:      Outcome: Progressing Note: Patient satisfied with pain goal.    Goal: Patient/caregiver ability to identify factors that increase the pain will improve Outcome: Progressing Note: Patient satisfied with pain goal.      Problem: Altered ability to manage pain: Goal: Pain level will decrease Outcome: Progressing Note: Patient satisfied with pain goal.    Goal: Satisfaction with pain management regimen will improve Outcome: Progressing Note: Patient satisfied with pain goal.    Goal: Patient's ability to cope will be supported Outcome: Progressing Note: Patient satisfied with pain goal.      Problem: Physical Mobility Impairment: Goal: Ability  to perform activities of daily living will improve Outcome: Progressing Goal: Ability to ambulate will improve Outcome: Progressing Goal: Ability to safely and independently change position in bed will improve Outcome: Progressing Goal: Range of joint motion will improve by discharge Outcome: Progressing

## 2024-02-29 NOTE — Procedures (Signed)
 Hester-Davis Fall Risk Assessment Tool Worksheet   Category Response Score Row Information  Last Known Fall 0=No falls  1= Within last year  2=Within last 6 months  3=Within last month  4=During the current hospitalization   0   Mobility 0=No limitations 1-Dizziness/generalized weakness 2-Immobilization/requires 1-person assistance 3-Use of assistive device/requires 2-person assistance 4-Hemiplegic, paraplegic, or quadriplegic 2 tiredness, fatigue, loss of strength Immobilized = state of or part of body positioned for decreased or eliminated movement (immobilizer, sling, C-collar, traction, spinal precautions, restraints); includes stand-by assist For balance or mobility Includes bedrest orders; includes hemiparesis   Medications Given in Past 24hrs  (Select all that apply) 0=No meds 1=Cardiovascular or central nervous system meds 2=Cardiovascular and central nervous system meds 3=Diuretics 4=Chemotherapy in the last month 1 2  CV meds: purpose of lowering BP or decreasing cardiac load. Beta-blockers, ACE inhibitors, vasodilators, calcium -channel blockers CNS meds: any that affect the nervous system Diuretics: ex. Lasix , bumex, mannitol  Mental Status/LOC/Awareness  (Select all that apply) 0=Awake, alert and oriented to date, place and person 1=Oriented to person and place 2=Lethargic,/oriented to person only 3=Memory loss/confusion and requires reorienting 4=Unresponsive/noncompliance with instruction 0 Unable or unwilling to react to stimulus. Non-compliant with instructions- inability to follow instructions or request due to cognitive issues such as dementia or delirium  affecting sound thought process   Behavior (Select all that apply) 0=Appropriate behavior 1=Depression/anxiety 2=Behavioral noncompliance w/instruction 3=Ethanol/substance abuse 4=Impulsiveness 0 History of or present during admission Refusal to follow instruction/request with understanding of  consequences; CHOICE of non-compliance History of or present during admission  Toilet Needs (Select all that apply) 0=No needs 1=Use of catheters or diversion devices 2=Use of assistive device (bedside commode, bedpan) 3=Incontinence 4=Diarrhea/frequency/urgency 0 Any device used for elimination, catheters, ostomies, or FMS   Volume/Electrolyte Status (Select all that apply) 0= No Problem 1= NPO greater than 24hr 2=Use of IV fluids/Tube Feeds 3=Nausea/Vomiting 4=Low Blood Sugar/electrolyte imbalances 0 2 - Continuous or intermittent 4 -BG<70 = any Blood glucose <70 at the time of assessment; imbalance may be high or low  Communication/Sensory (Select all that apply) 0=No problems  1=Visual (glasses)/hearing deficit  2=Non-English patient/unable to speak/slurred speech  3=Neuropathy  4=Blindness or recent visual changes   0 Includes glasses or contacts and hearing aids or sound amplifiers  Includes intubated patients, expressive or receptive aphasia  Age (Single-Select) 0=<20 yo  1=20-40 yo  2=40-60 yo  3=>60 years   3   HD FALL RISK TOTAL SCORE 0-6 Universal Safety Precautions 7-10 Low Fall Risk 11-14 Moderate Fall Risk 15+ High Fall Risk 8 Address sub-scale factors independently and as appropriate based on individual patient presentation and nursing management

## 2024-03-13 DIAGNOSIS — Z4889 Encounter for other specified surgical aftercare: Secondary | ICD-10-CM | POA: Diagnosis not present

## 2024-03-13 DIAGNOSIS — Z96611 Presence of right artificial shoulder joint: Secondary | ICD-10-CM | POA: Diagnosis not present

## 2024-03-23 ENCOUNTER — Encounter: Admitting: Internal Medicine

## 2024-03-23 ENCOUNTER — Telehealth: Payer: Self-pay

## 2024-03-23 ENCOUNTER — Telehealth: Payer: Self-pay | Admitting: Internal Medicine

## 2024-03-23 DIAGNOSIS — I10 Essential (primary) hypertension: Secondary | ICD-10-CM | POA: Diagnosis not present

## 2024-03-23 DIAGNOSIS — Z01818 Encounter for other preprocedural examination: Secondary | ICD-10-CM | POA: Diagnosis not present

## 2024-03-23 DIAGNOSIS — M19012 Primary osteoarthritis, left shoulder: Secondary | ICD-10-CM | POA: Diagnosis not present

## 2024-03-23 DIAGNOSIS — M069 Rheumatoid arthritis, unspecified: Secondary | ICD-10-CM | POA: Diagnosis not present

## 2024-03-23 DIAGNOSIS — Z6836 Body mass index (BMI) 36.0-36.9, adult: Secondary | ICD-10-CM | POA: Diagnosis not present

## 2024-03-23 DIAGNOSIS — I5020 Unspecified systolic (congestive) heart failure: Secondary | ICD-10-CM | POA: Diagnosis not present

## 2024-03-23 DIAGNOSIS — G4733 Obstructive sleep apnea (adult) (pediatric): Secondary | ICD-10-CM | POA: Diagnosis not present

## 2024-03-23 DIAGNOSIS — I272 Pulmonary hypertension, unspecified: Secondary | ICD-10-CM | POA: Diagnosis not present

## 2024-03-23 DIAGNOSIS — J449 Chronic obstructive pulmonary disease, unspecified: Secondary | ICD-10-CM | POA: Diagnosis not present

## 2024-03-23 DIAGNOSIS — E119 Type 2 diabetes mellitus without complications: Secondary | ICD-10-CM | POA: Diagnosis not present

## 2024-03-23 NOTE — Telephone Encounter (Signed)
 Patient called on Friday, 9/19.  Stated that he had questions re: surgery.  Insurance denied Barostim procedure with [facility].  Left a message for patient.

## 2024-03-23 NOTE — Unmapped External Note (Addendum)
 Medication instructions prior to procedure:    Medication Sig INSTRUCTIONS  . acetaminophen  (TYLENOL ) 500 MG tablet Take 1,000 mg by mouth every 8 (eight) hours as needed for Pain TAKE day of procedure, if needed  . amLODIPine  (NORVASC ) 10 MG tablet Take 10 mg by mouth once daily TAKE day of procedure  . aspirin  81 MG EC tablet Take 81 mg by mouth 2 (two) times daily Stop 5 days prior to surgery  . atorvastatin  (LIPITOR) 40 MG tablet Take 40 mg by mouth at bedtime TAKE night before procedure  . brinzolamide -brimonidine  1-0.2 % DrpS Place 1 drop into both eyes 3 (three) times daily TAKE day of procedure  . budesonide -glycopyrrolate-formoterol  (BREZTRI AEROSPHERE) 160-9-4.8 mcg/actuation inhaler Inhale 2 inhalations into the lungs once daily as needed (shortness of breath) TAKE day of procedure  . carvediloL  (COREG ) 25 MG tablet Take 25 mg by mouth 2 (two) times daily TAKE day of procedure  . celecoxib (CELEBREX) 200 MG capsule Take 200 mg by mouth 2 (two) times daily as needed for Pain STOP taking 7 days prior to procedure  . cholecalciferol  (VITAMIN D3) 2,000 unit tablet Take 50 mcg by mouth once daily DO NOT TAKE day of procedure  . COLACE 100 mg capsule Take 100 mg by mouth 2 (two) times daily DO NOT TAKE day of procedure  . DULoxetine  (CYMBALTA ) 60 MG DR capsule Take 60 mg by mouth once daily TAKE day of procedure       . latanoprost  (XALATAN ) 0.005 % ophthalmic solution Place 1 drop into both eyes at bedtime TAKE night before procedure  . multivitamin with minerals tablet Take 1 tablet by mouth once daily STOP taking 7 days prior to procedure  . omega-3 fatty acids 1,000 mg capsule Take 1,000 mg by mouth once daily STOP taking 7 days prior to procedure  . omeprazole (PRILOSEC) 20 MG DR capsule Take 20 mg by mouth 2 (two) times daily TAKE day of procedure  . ondansetron  (ZOFRAN -ODT) 4 MG disintegrating tablet Take 4 mg by mouth every 8 (eight) hours as needed for Nausea TAKE day of procedure,  if needed  . OXcarbazepine  (TRILEPTAL ) 150 MG tablet Take 150 mg by mouth every 12 (twelve) hours TAKE day of procedure  . potassium chloride  (KLOR-CON  M20) 20 MEQ ER tablet Take 20 mEq by mouth once daily Take day of procedure  . sacubitriL -valsartan  (ENTRESTO ) 97-103 mg tablet Take 1 tablet by mouth every 12 (twelve) hours DO NOT TAKE day of procedure  . semaglutide  (OZEMPIC ) 1 mg/dose (4 mg/3 mL) pen injector Inject 1 mg subcutaneously once a week DO NOT TAKE day of procedure  . sennosides (SENOKOT) 8.6 mg tablet Take 1 tablet by mouth once daily DO NOT TAKE day of procedure  . spironolactone  (ALDACTONE ) 25 MG tablet Take 25 mg by mouth once daily TAKE day of procedure  . timolol  (TIMOPTIC -XE) 0.5 % ophthalmic gel-forming solution Place 1 drop into both eyes once daily TAKE day of procedure  . TORsemide  (DEMADEX ) 20 MG tablet Take 40 mg by mouth once daily TAKE day of procedure  . vericiguat  (VERQUVO ) 2.5 mg tablet Take 2.5 mg by mouth once daily DO NOT TAKE day of procedure  The day of your procedure, you will be given medicine that makes you go to sleep (called anesthesia). Since you are or have been taking a medicine recently called Semaglutide  (Ozemp[ic) (a Glucagon-Like Peptide-1 Receptor), we want you to follow a special diet one full day prior to your procedure. It  is called a Clear Liquid Diet (start in the morning after you get up). That means you cannot eat any solid foods or dairy. But do not worry! You can still have tasty things like clear broth (no floating pieces/spices), sugar free Jello, Water , no sugar added Clear Sports Drinks (high protein if available), Apple Juice, and Soda. This diet helps keep you safe during your procedure. We do not want any food or liquid accidentally going into your airway or making you feel sick after you wake up. You must finish your Clear Liquid Diet by MIDNIGHT.   The morning of your surgery you may have up to 16 ounces of CLEAR LIQUIDS ONLY up to 1  hour prior to arrival time.    APPROVED CLEAR LIQUIDS: Water , Apple Juice, Soda, Clear Sports Drinks  Reminder:  You may not use milk or creamers of any kind. Your case may be cancelled if these instructions are not followed.   If you have been instructed to take medication(s) the morning of surgery and you normally take that medication(s) at night, then take night before as you usually would and not the morning of surgery.  ADDITIONAL INSTRUCTIONS:  We understand your surgery experience may cause high anxiety and stress for you and your family.  Arriving early allows nurses and anesthesia providers to complete assessments, laboratory tests, etc. And determine safety status for surgery.    The business day before your surgery you will be called between 2-5 pm with your expected time of arrival.  If you do not hear from someone by 5pm call [phone]. Please ignore any emails with your surgery time.  Take meds as instructed with a small sip of water  on the morning of your procedure.  You may not have solid foods after midnight the night before your procedure.  You may have up to 16 ounces of clear liquids up to 1 hour prior to arrival time on the day of your scheduled procedure.   Approved clear liquids: Water , Apple Juice, Clear Sports Drinks, Dorisann Your case may be cancelled if these instructions are not followed.  -  SURGICAL SCRUB (Please Review Instructions provided)   Avoid using the following NSAIDS - Non-steroidal anti-inflammatory drugs 7 days before surgery - example: Ibuprofen, Motrin, Aleve, Advil, Excedrin, BC Powder, Goody Powder  Avoid using Vitamin E and  Multivitamin 7 days before surgery  Tylenol  (Acetaminophen ) is ok to take   Please bring inhalers the day of surgery  May brush your teeth-spit out anything in your mouth No gum or hard candy No makeup, creams, deodorant, powders or lotions No finger nail and no toe nail polish No hair spray or gels If you have  been fitted for a special brace or immobilizer bring to preop day of surgery Please bring living will or health care power of attorney (advance directives) if available If you are diagnosed with sleep apnea bring your CPAP/BIPAP day of surgery (leave in the car) If you smoke - recommendations are to stop now before surgery.  Do not smoke day of surgery.  We are not responsible for any belongings left in the preop space, therefore: Leave ALL watches, rings, necklaces, piercings, and electronic devices as well as your wallet/purse at home (You are at risk for burns if any metal is left on your body. If piercings are in place, they are to be removed or replaced with plastic posts/retainers) Bring case for contacts, glasses, dentures, hearing aids (will need to be removed before  surgery) and given to your designated visitor If you are staying overnight DO NOT bring an overnight bag (including crutches, c-pap or bi-pap equipment) into the hospital with you. Have designated visitor bring to room after surgery If being dropped off for surgery, leave all belongings with your designated visitor (including cell phone) You will need to bring your photo ID and insurance card   **Please be aware if you are scheduled to go home the same day you must have a driver and someone to stay with you 24 hours after your surgery.  If you need reschedule your procedure, please contact your surgery team's office as soon as possible.     [facility] VISITOR PROTOCOL:  Our Infection Prevention/Infectious Disease specialists anticipate another "tripledemic" this season related to COVID-19, influenza, and RSV. It's vital for everyone to protect themselves and others from infection.  Masking is strongly recommended for patients, visitors, and team members now through August 31, 2023. We know that wearing a mask is a simple and effective way to protect yourself from acquiring or spreading respiratory viruses  and other communicable diseases. Masking is still required in high-risk clinical areas, during clusters or outbreaks, and during the active COVID-19 infectious period. Visitors with fever, cough, or other flu-like systems should not visit.  Visitation highlights: Hospital visiting hours: 8 a.m. - 9 p.m. daily. Up to 2 visitors at a time where space permits; switching is allowed. Visitors of all ages are allowed in inpatient, maternity, and ambulatory spaces. Visitors must be 18+ in perioperative/surgical/procedural areas. Note: Additional visitation and masking precautions may apply to certain patient populations.  Exceptions to visitation restrictions may be granted based on special circumstances.  For Patients Staying Overnight - Inpatient: Visitors will be screened at the entrance and must check in at the hospital information desk to receive a visitor badge to enter inpatient areas. Additional precautions may apply to certain patient populations. Please follow all unit guidelines. You are allowed one overnight visitor 18+ Exceptions may be allowed for end-of-life and Compassionate Care patients. Visitors must check out upon departure using a kiosk or with information desk staff.   For Patients Going Home - Outpatient: Visitors will be screened at the entrance and must check in at the hospital information desk to receive a visitor badgeto enter patient care areas. Minors must be attended by an adult, 18+ Only 1 visitor / caregiver is allowed in the Recovery Room at any given time Additional precautions may apply to certain patient populations. Please follow all unit guidelines. Visitors must check out upon departure using a kiosk or with information desk staff.  You will be asked to wear a mask: When visiting our [facility] clinic on the [facility] (staff and patients) When respiratory symptoms are present (staff and patients) When visiting COVID positive patients (staff and  patients)   DAY OF SURGERY DIRECTIONS:  Day of surgery enter through the main hospital entrance of [facility] (3rd level-same level as parking).  [address] [place], [place] [address]  Your SAFETY is our #1 concern.   Please use good hand hygiene at all times.  If you develop fevers, chills, and/or other signs of infection prior to your surgery please contact your surgeon's office as soon as possible.  Universal COVID-19 testing is no longer required prior to admission in most areas.  Please contact your Surgery Care Team: - If you are experiencing or have experienced symptoms concerning for COVID-19 three to ten days  prior to your planned procedure, so appropriate testing can be scheduled.  - If you have had a COVID-19 exposure 10 days prior to your planned procedure, so appropriate testing can be scheduled. - If you have tested POSITIVE WITHIN THE LAST 90 DAYS along with providing appropriate documentation.

## 2024-03-23 NOTE — Telephone Encounter (Signed)
 Called to confirm/remind patient of their appointment at the [facility] on 03/24/24.   Appointment:   [x] Confirmed  [] Left mess   [] No answer/No voice mail  [] VM Full/unable to leave message  [] Phone not in service  Patient reminded to bring all medications and/or complete list.  Confirmed patient has transportation. Gave directions, instructed to utilize valet parking.

## 2024-03-23 NOTE — Telephone Encounter (Signed)
 HF diagnostics currently abnormal.  Has apt with HF team tomorrow 03/24/24.   Routing note as FYI.

## 2024-03-24 ENCOUNTER — Encounter: Admitting: Internal Medicine

## 2024-03-25 ENCOUNTER — Encounter: Admitting: Internal Medicine

## 2024-03-30 ENCOUNTER — Telehealth: Payer: Self-pay | Admitting: Cardiology

## 2024-03-30 NOTE — Telephone Encounter (Signed)
 Called to confirm/remind patient of their appointment at the [facility] on 03/31/24.   Appointment:   [x] Confirmed  [] Left mess   [] No answer/No voice mail  [] VM Full/unable to leave message  [] Phone not in service  Patient reminded to bring all medications and/or complete list.  Confirmed patient has transportation. Gave directions, instructed to utilize valet parking.

## 2024-03-31 ENCOUNTER — Encounter: Payer: Self-pay | Admitting: Cardiology

## 2024-03-31 ENCOUNTER — Ambulatory Visit: Attending: Cardiology | Admitting: Cardiology

## 2024-03-31 VITALS — BP 137/80 | HR 73 | Wt 278.6 lb

## 2024-03-31 DIAGNOSIS — E1122 Type 2 diabetes mellitus with diabetic chronic kidney disease: Secondary | ICD-10-CM

## 2024-03-31 DIAGNOSIS — I5022 Chronic systolic (congestive) heart failure: Secondary | ICD-10-CM | POA: Diagnosis not present

## 2024-03-31 DIAGNOSIS — I110 Hypertensive heart disease with heart failure: Secondary | ICD-10-CM | POA: Diagnosis not present

## 2024-03-31 DIAGNOSIS — I5020 Unspecified systolic (congestive) heart failure: Secondary | ICD-10-CM | POA: Diagnosis not present

## 2024-03-31 DIAGNOSIS — Z6841 Body Mass Index (BMI) 40.0 and over, adult: Secondary | ICD-10-CM | POA: Diagnosis not present

## 2024-03-31 DIAGNOSIS — Z95810 Presence of automatic (implantable) cardiac defibrillator: Secondary | ICD-10-CM | POA: Insufficient documentation

## 2024-03-31 DIAGNOSIS — Z79899 Other long term (current) drug therapy: Secondary | ICD-10-CM | POA: Diagnosis not present

## 2024-03-31 DIAGNOSIS — E119 Type 2 diabetes mellitus without complications: Secondary | ICD-10-CM | POA: Diagnosis not present

## 2024-03-31 DIAGNOSIS — G4733 Obstructive sleep apnea (adult) (pediatric): Secondary | ICD-10-CM | POA: Diagnosis not present

## 2024-03-31 DIAGNOSIS — N1831 Chronic kidney disease, stage 3a: Secondary | ICD-10-CM

## 2024-03-31 DIAGNOSIS — E66813 Obesity, class 3: Secondary | ICD-10-CM | POA: Diagnosis not present

## 2024-03-31 DIAGNOSIS — I493 Ventricular premature depolarization: Secondary | ICD-10-CM | POA: Insufficient documentation

## 2024-03-31 DIAGNOSIS — Z794 Long term (current) use of insulin: Secondary | ICD-10-CM

## 2024-03-31 MED ORDER — VERQUVO 10 MG PO TABS: 10.0000 mg | ORAL_TABLET | Freq: Every day | ORAL | 5 refills | Status: AC

## 2024-03-31 NOTE — Patient Instructions (Signed)
 Medication Changes:  Take 2 - 2.5 MG tablets for 2 weeks until you run out.  Increase Verquvo  to 10 MG once daily   Follow-Up in: 3 months with Dr. Bensimhon.   Thank you for choosing [facility].    At the [facility], you and your health needs are our priority. We have a designated team specialized in the treatment of Heart Failure. This Care Team includes your primary Heart Failure Specialized Cardiologist (physician), Advanced Practice Providers (APPs- Physician Assistants and Nurse Practitioners), and Pharmacist who all work together to provide you with the care you need, when you need it.   You may see any of the following providers on your designated Care Team at your next follow up:  Dr. Toribio Fuel Dr. Ezra Shuck Dr. Ria Commander Dr. Morene Brownie Ellouise Class, FNP Jaun Bash, RPH-CPP  Please be sure to bring in all your medications bottles to every appointment.   Need to Contact Us :  If you have any questions or concerns before your next appointment please send us  a message through [place] or call our office at [phone].    TO LEAVE A MESSAGE FOR THE NURSE SELECT OPTION 2, PLEASE LEAVE A MESSAGE INCLUDING: YOUR NAME DATE OF BIRTH CALL BACK NUMBER REASON FOR CALL**this is important as we prioritize the call backs  YOU WILL RECEIVE A CALL BACK THE SAME DAY AS LONG AS YOU CALL BEFORE 4:00 PM

## 2024-03-31 NOTE — Progress Notes (Unsigned)
 ADVANCED HEART FAILURE FOLLOW UP CLINIC NOTE  Referring Physician: [facility]  Primary Care: [facility] Primary Cardiologist:  HPI: Erik Black is a 73 y.o. male with a PMH of morbid obesity, HTN, OSA, DM, HFrEF who presents for follow up of chronic systolic heart failure.     Diagnosed with HF in 9/23. Echo 9/23 EF 30-35%. Started lasix . Referred to [facility]   Admitted 11/23 with ADHF cath as below. Treated with IV lasix  and GDMT titrated. Diuresed 22 pounds.    Underwent ICD placement 01/28/23, admitted in 04/2023 with mechanical fall.      SUBJECTIVE:  Reports that he has felt somewhat better since starting the vericiguat . He reports that he has slightly more energy, but still has issues with early fatigue.  Does not have any significant change in his volume symptoms.  Otherwise has been compliant with his medical therapy and denies any worsening chest pain, shortness of breath, lower extremity swelling.   PMH, current medications, allergies, social history, and family history reviewed in epic.  PHYSICAL EXAM: Vitals:   03/31/24 0951  BP: 137/80  Pulse: 73  SpO2: 100%   GENERAL: NAD, well appearing PULM:  Normal work of breathing, CTAB CARDIAC:  JVP: flat         Normal rate with regular rhythm. No murmurs, rubs or gallops.  Trace edema. Warm and well perfused extremities. ABDOMEN: Soft, non-tender, non-distended. NEUROLOGIC: Patient is oriented x3 with no focal or lateralizing neurologic deficits.      DATA REVIEW  ECG: 08/30/23: NSR, incomplete LBBB    ECHO: - Echo 3/24: EF 20-25% RV ok  - Echo 7/24; EF 25-30% RV ok   CMR:  - cMRI: 11/23 EF 25% mid wall LGE strip c/w NICM. RV moderately down.   Zio:  Zio 11/23: Occasional NSVT 13.7% PVCs (one predominant morphology 13.6%) ASSESSMENT & PLAN:  1. 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 He has met with Dr. Fairy in [place] who agrees that Fhx is concerning for potential genetic CM however he has no first-degree relatives that would benefit from screening so has decided not to pursue - Mildly improved NYHA class III symptoms - Barostim denied - Discussed potential vericiguat  for symptom management, willing to trial. Start 2.5mg  daily today, follow up with pharmacy in 3-4 weeks for titration - Continue torsemide  40 mg daily, euvolemic - Continue Entresto  97/103 mg twice daily, carvedilol  25 mg twice daily, spironolactone  25 mg daily -Started vericiguat  at last visit, titrate up to 5 mg daily then 10 mg daily in 2 weeks if tolerating - Intolerant of jardiance, yeast infection. - Patient is a moderate risk candidate for moderate risk surgical procedure, having ongoing left shoulder pain.  He is well optimized from a cardiovascular standpoint and needs no further workup prior to any planned procedure.  Would recommend being done in a hospital setting given cardiac history if possible.    2. Frequent PVCs - Zio 11/23: Occasional NSVT 13.7% PVCs (one predominant morphology 13.6%) - Continue mexiletine 200 mg twice daily for suppression - Zio 6/24 Sinus. 2.1% PVCs (suppressed on mexilitene)   3. HTN - Blood pressure well-controlled, continue current regimen   4. OSA - compliant with CPAP   5. Morbid obesity  - continue GLP-1 RA - Body mass index is 41  Follow-up  in 3 months  Morene Brownie, MD Advanced Heart Failure Mechanical Circulatory Support 03/31/24

## 2024-04-01 NOTE — Progress Notes (Signed)
 Remote ICD Transmission

## 2024-04-03 NOTE — Progress Notes (Signed)
 Erik Black                                          MRN: [id number]   04/03/2024   The VBCI Quality Team Specialist reviewed this patient medical record for the purposes of chart review for care gap closure. The following were reviewed: chart review for care gap closure-kidney health evaluation for diabetes:eGFR  and uACR.    VBCI Quality Team

## 2024-04-09 NOTE — Progress Notes (Signed)
 Erik Black                                          MRN: [id number]   04/09/2024   The VBCI Quality Team Specialist reviewed this patient medical record for the purposes of chart review for care gap closure. The following were reviewed: abstraction for care gap closure-glycemic status assessment.    VBCI Quality Team

## 2024-04-22 DIAGNOSIS — M25512 Pain in left shoulder: Secondary | ICD-10-CM | POA: Diagnosis not present

## 2024-04-22 DIAGNOSIS — Z96611 Presence of right artificial shoulder joint: Secondary | ICD-10-CM | POA: Diagnosis not present

## 2024-04-22 DIAGNOSIS — M19012 Primary osteoarthritis, left shoulder: Secondary | ICD-10-CM | POA: Diagnosis not present

## 2024-04-22 DIAGNOSIS — Z4889 Encounter for other specified surgical aftercare: Secondary | ICD-10-CM | POA: Diagnosis not present

## 2024-04-28 ENCOUNTER — Ambulatory Visit (INDEPENDENT_AMBULATORY_CARE_PROVIDER_SITE_OTHER): Payer: Medicare HMO

## 2024-04-28 DIAGNOSIS — I428 Other cardiomyopathies: Secondary | ICD-10-CM | POA: Diagnosis not present

## 2024-04-29 LAB — CUP PACEART REMOTE DEVICE CHECK
Battery Remaining Longevity: 156 mo
Battery Voltage: 3.01 V
Brady Statistic RA Percent Paced: INVALID
Brady Statistic RV Percent Paced: 0.01 %
Date Time Interrogation Session: 20251028095838
HighPow Impedance: 66 Ohm
Implantable Lead Connection Status: 753985
Implantable Lead Implant Date: 20240729
Implantable Lead Location: 753860
Implantable Pulse Generator Implant Date: 20240729
Lead Channel Impedance Value: 551 Ohm
Lead Channel Impedance Value: 627 Ohm
Lead Channel Pacing Threshold Amplitude: 0.375 V
Lead Channel Pacing Threshold Pulse Width: 0.4 ms
Lead Channel Sensing Intrinsic Amplitude: 6 mV
Lead Channel Setting Pacing Amplitude: 1.5 V
Lead Channel Setting Pacing Pulse Width: 0.4 ms
Lead Channel Setting Sensing Sensitivity: 0.3 mV
Zone Setting Status: 755011

## 2024-04-30 ENCOUNTER — Ambulatory Visit: Payer: Self-pay | Admitting: Cardiology

## 2024-05-05 NOTE — Progress Notes (Signed)
 Remote ICD Transmission

## 2024-05-08 DIAGNOSIS — E669 Obesity, unspecified: Secondary | ICD-10-CM | POA: Diagnosis not present

## 2024-05-08 DIAGNOSIS — J449 Chronic obstructive pulmonary disease, unspecified: Secondary | ICD-10-CM | POA: Diagnosis not present

## 2024-05-08 DIAGNOSIS — I4510 Unspecified right bundle-branch block: Secondary | ICD-10-CM | POA: Diagnosis not present

## 2024-05-08 DIAGNOSIS — R9431 Abnormal electrocardiogram [ECG] [EKG]: Secondary | ICD-10-CM | POA: Diagnosis not present

## 2024-05-08 DIAGNOSIS — Z01818 Encounter for other preprocedural examination: Secondary | ICD-10-CM | POA: Diagnosis not present

## 2024-05-08 DIAGNOSIS — Z01810 Encounter for preprocedural cardiovascular examination: Secondary | ICD-10-CM | POA: Diagnosis not present

## 2024-05-08 DIAGNOSIS — Z9889 Other specified postprocedural states: Secondary | ICD-10-CM | POA: Diagnosis not present

## 2024-05-08 DIAGNOSIS — E1122 Type 2 diabetes mellitus with diabetic chronic kidney disease: Secondary | ICD-10-CM | POA: Diagnosis not present

## 2024-05-08 DIAGNOSIS — M25511 Pain in right shoulder: Secondary | ICD-10-CM | POA: Diagnosis not present

## 2024-05-08 DIAGNOSIS — I129 Hypertensive chronic kidney disease with stage 1 through stage 4 chronic kidney disease, or unspecified chronic kidney disease: Secondary | ICD-10-CM | POA: Diagnosis not present

## 2024-05-08 DIAGNOSIS — N183 Chronic kidney disease, stage 3 unspecified: Secondary | ICD-10-CM | POA: Diagnosis not present

## 2024-05-08 DIAGNOSIS — M19012 Primary osteoarthritis, left shoulder: Secondary | ICD-10-CM | POA: Diagnosis not present

## 2024-05-08 DIAGNOSIS — G4733 Obstructive sleep apnea (adult) (pediatric): Secondary | ICD-10-CM | POA: Diagnosis not present

## 2024-05-11 ENCOUNTER — Telehealth (HOSPITAL_BASED_OUTPATIENT_CLINIC_OR_DEPARTMENT_OTHER): Payer: Self-pay

## 2024-05-11 NOTE — Telephone Encounter (Signed)
 Clearance is in my clinic note. Thank you

## 2024-05-11 NOTE — Telephone Encounter (Signed)
 Dr. Zenaida, you recently saw this pt in clinic. Are you able to comment on surgical clearance for upcoming total shoulder arthroplasty scheduled for 05/22/2024? Please route your response to P CV DIV PREOP. Thank you!

## 2024-05-11 NOTE — Telephone Encounter (Signed)
   Patient Name: Erik Black  DOB: 01-08-1951 MRN: [id number]  Primary Cardiologist: Redell Cave, MD  Chart reviewed as part of pre-operative protocol coverage. Given past medical history and time since last visit, based on ACC/AHA guidelines, Erik Black is at acceptable risk for the planned procedure without further cardiovascular testing.   Per Dr. Alvan: Patient is a moderate risk candidate for moderate risk surgical procedure, having ongoing left shoulder pain.  He is well optimized from a cardiovascular standpoint and needs no further workup prior to any planned procedure.  Would recommend being done in a hospital setting given cardiac history if possible.  Regarding ASA therapy, we recommend continuation of ASA throughout the perioperative period. However, if the surgeon feels that cessation of ASA is required in the perioperative period, it may be stopped 5-7 days prior to surgery with a plan to resume it as soon as felt to be feasible from a surgical standpoint in the post-operative period.   I will route this recommendation to the requesting party via Epic fax function and remove from pre-op pool.  Please call with questions.  Maysen Sudol E Doreena Maulden, NP 05/11/2024, 1:26 PM

## 2024-05-11 NOTE — Telephone Encounter (Signed)
   Pre-operative Risk Assessment    Patient Name: Erik Black  DOB: 07/16/1950 MRN: [id number]   Date of last office visit: 03/31/2024 Morene Brownie, MD Date of next office visit: 06/16/2024 Toribio Fuel   Request for Surgical Clearance    Procedure:  Total shoulder arthroplasty  Date of Surgery:  Clearance 05/22/24                                 Surgeon:  Christopher Krifto  Surgeon's Group or Practice Name:  [facility] Phone number:  [phone] Fax number:  [phone]   Type of Clearance Requested:   - Medical  - Pharmacy:  Hold Aspirin  Not indicated   Type of Anesthesia:  MAC/Regional   Additional requests/questions:    Bonney Augustin JONETTA Delores   05/11/2024, 9:13 AM

## 2024-05-12 ENCOUNTER — Telehealth: Payer: Self-pay

## 2024-05-12 NOTE — Telephone Encounter (Signed)
 Received call from pt regarding cards clearance for shoulder arthroplasty. Have not received cards clearance. Called dr. Jurline office to follow up. Left voicemail to return call.

## 2024-05-22 DIAGNOSIS — I5020 Unspecified systolic (congestive) heart failure: Secondary | ICD-10-CM | POA: Diagnosis not present

## 2024-05-22 DIAGNOSIS — G8918 Other acute postprocedural pain: Secondary | ICD-10-CM | POA: Diagnosis not present

## 2024-05-22 DIAGNOSIS — G4733 Obstructive sleep apnea (adult) (pediatric): Secondary | ICD-10-CM | POA: Diagnosis not present

## 2024-05-22 DIAGNOSIS — I130 Hypertensive heart and chronic kidney disease with heart failure and stage 1 through stage 4 chronic kidney disease, or unspecified chronic kidney disease: Secondary | ICD-10-CM | POA: Diagnosis not present

## 2024-05-22 DIAGNOSIS — N183 Chronic kidney disease, stage 3 unspecified: Secondary | ICD-10-CM | POA: Diagnosis not present

## 2024-05-22 DIAGNOSIS — G8929 Other chronic pain: Secondary | ICD-10-CM | POA: Diagnosis not present

## 2024-05-22 DIAGNOSIS — E1122 Type 2 diabetes mellitus with diabetic chronic kidney disease: Secondary | ICD-10-CM | POA: Diagnosis not present

## 2024-05-22 DIAGNOSIS — M25512 Pain in left shoulder: Secondary | ICD-10-CM | POA: Diagnosis not present

## 2024-05-22 DIAGNOSIS — J449 Chronic obstructive pulmonary disease, unspecified: Secondary | ICD-10-CM | POA: Diagnosis not present

## 2024-05-22 DIAGNOSIS — M19012 Primary osteoarthritis, left shoulder: Secondary | ICD-10-CM | POA: Diagnosis not present

## 2024-05-22 DIAGNOSIS — J9811 Atelectasis: Secondary | ICD-10-CM | POA: Diagnosis not present

## 2024-05-23 DIAGNOSIS — G8918 Other acute postprocedural pain: Secondary | ICD-10-CM | POA: Diagnosis not present

## 2024-05-23 DIAGNOSIS — I130 Hypertensive heart and chronic kidney disease with heart failure and stage 1 through stage 4 chronic kidney disease, or unspecified chronic kidney disease: Secondary | ICD-10-CM | POA: Diagnosis not present

## 2024-05-23 DIAGNOSIS — N183 Chronic kidney disease, stage 3 unspecified: Secondary | ICD-10-CM | POA: Diagnosis not present

## 2024-05-23 DIAGNOSIS — J449 Chronic obstructive pulmonary disease, unspecified: Secondary | ICD-10-CM | POA: Diagnosis not present

## 2024-05-23 DIAGNOSIS — J9811 Atelectasis: Secondary | ICD-10-CM | POA: Diagnosis not present

## 2024-05-23 DIAGNOSIS — M19012 Primary osteoarthritis, left shoulder: Secondary | ICD-10-CM | POA: Diagnosis not present

## 2024-05-23 DIAGNOSIS — G4733 Obstructive sleep apnea (adult) (pediatric): Secondary | ICD-10-CM | POA: Diagnosis not present

## 2024-05-23 DIAGNOSIS — E1122 Type 2 diabetes mellitus with diabetic chronic kidney disease: Secondary | ICD-10-CM | POA: Diagnosis not present

## 2024-05-23 DIAGNOSIS — I5020 Unspecified systolic (congestive) heart failure: Secondary | ICD-10-CM | POA: Diagnosis not present

## 2024-05-26 ENCOUNTER — Other Ambulatory Visit: Payer: Self-pay | Admitting: Internal Medicine

## 2024-06-03 DIAGNOSIS — Z96612 Presence of left artificial shoulder joint: Secondary | ICD-10-CM | POA: Diagnosis not present

## 2024-06-03 DIAGNOSIS — Z4889 Encounter for other specified surgical aftercare: Secondary | ICD-10-CM | POA: Diagnosis not present

## 2024-06-16 ENCOUNTER — Encounter: Admitting: Internal Medicine

## 2024-06-18 ENCOUNTER — Other Ambulatory Visit: Payer: Self-pay | Admitting: Internal Medicine

## 2024-06-20 ENCOUNTER — Other Ambulatory Visit: Payer: Self-pay | Admitting: Internal Medicine

## 2024-06-20 DIAGNOSIS — I5022 Chronic systolic (congestive) heart failure: Secondary | ICD-10-CM

## 2024-06-22 ENCOUNTER — Telehealth: Payer: Self-pay | Admitting: Cardiology

## 2024-06-22 NOTE — Telephone Encounter (Signed)
 Called to confirm/remind patient of their appointment at the [facility] on 06/23/24.   Appointment:   [x] Confirmed  [] Left mess   [] No answer/No voice mail  [] VM Full/unable to leave message  [] Phone not in service  Patient reminded to bring all medications and/or complete list.  Confirmed patient has transportation. Gave directions, instructed to utilize valet parking.

## 2024-06-23 ENCOUNTER — Other Ambulatory Visit
Admission: RE | Admit: 2024-06-23 | Discharge: 2024-06-23 | Disposition: A | Discharge status: Home / Self Care | Source: Ambulatory Visit | Attending: Cardiology | Admitting: Cardiology

## 2024-06-23 ENCOUNTER — Ambulatory Visit (HOSPITAL_COMMUNITY): Payer: Self-pay | Admitting: Cardiology

## 2024-06-23 ENCOUNTER — Ambulatory Visit (HOSPITAL_BASED_OUTPATIENT_CLINIC_OR_DEPARTMENT_OTHER): Admitting: Cardiology

## 2024-06-23 VITALS — BP 131/77 | HR 83 | Wt 276.4 lb

## 2024-06-23 DIAGNOSIS — I428 Other cardiomyopathies: Secondary | ICD-10-CM | POA: Insufficient documentation

## 2024-06-23 DIAGNOSIS — I493 Ventricular premature depolarization: Secondary | ICD-10-CM

## 2024-06-23 DIAGNOSIS — N1832 Chronic kidney disease, stage 3b: Secondary | ICD-10-CM

## 2024-06-23 DIAGNOSIS — R5383 Other fatigue: Secondary | ICD-10-CM | POA: Diagnosis not present

## 2024-06-23 DIAGNOSIS — G4733 Obstructive sleep apnea (adult) (pediatric): Secondary | ICD-10-CM | POA: Diagnosis not present

## 2024-06-23 DIAGNOSIS — I480 Paroxysmal atrial fibrillation: Secondary | ICD-10-CM | POA: Diagnosis not present

## 2024-06-23 DIAGNOSIS — I5022 Chronic systolic (congestive) heart failure: Secondary | ICD-10-CM | POA: Diagnosis not present

## 2024-06-23 DIAGNOSIS — I10 Essential (primary) hypertension: Secondary | ICD-10-CM | POA: Diagnosis not present

## 2024-06-23 LAB — FERRITIN: Ferritin: 269 ng/mL (ref 24–336)

## 2024-06-23 LAB — LIPID PANEL
Cholesterol: 171 mg/dL (ref 0–200)
HDL: 48 mg/dL
LDL Cholesterol: 108 mg/dL — ABNORMAL HIGH (ref 0–99)
Total CHOL/HDL Ratio: 3.5 ratio
Triglycerides: 74 mg/dL
VLDL: 15 mg/dL (ref 0–40)

## 2024-06-23 LAB — COMPREHENSIVE METABOLIC PANEL WITH GFR
ALT: 8 U/L (ref 0–44)
AST: 14 U/L — ABNORMAL LOW (ref 15–41)
Albumin: 4.1 g/dL (ref 3.5–5.0)
Alkaline Phosphatase: 174 U/L — ABNORMAL HIGH (ref 38–126)
Anion gap: 10 (ref 5–15)
BUN: 15 mg/dL (ref 8–23)
CO2: 28 mmol/L (ref 22–32)
Calcium: 9.7 mg/dL (ref 8.9–10.3)
Chloride: 101 mmol/L (ref 98–111)
Creatinine, Ser: 1.42 mg/dL — ABNORMAL HIGH (ref 0.61–1.24)
GFR, Estimated: 53 mL/min — ABNORMAL LOW
Glucose, Bld: 112 mg/dL — ABNORMAL HIGH (ref 70–99)
Potassium: 4 mmol/L (ref 3.5–5.1)
Sodium: 140 mmol/L (ref 135–145)
Total Bilirubin: 0.4 mg/dL (ref 0.0–1.2)
Total Protein: 6.9 g/dL (ref 6.5–8.1)

## 2024-06-23 LAB — IRON AND TIBC
Iron: 79 ug/dL (ref 45–182)
Saturation Ratios: 30 % (ref 17.9–39.5)
TIBC: 259 ug/dL (ref 250–450)
UIBC: 180 ug/dL

## 2024-06-23 MED ORDER — APIXABAN 5 MG PO TABS: 5.0000 mg | ORAL_TABLET | Freq: Two times a day (BID) | ORAL | 6 refills | Status: AC

## 2024-06-23 NOTE — Patient Instructions (Addendum)
 Medication Changes:  INCREASE Torsemide  to 40mg  (2 tabs) two times daily FOR 3 DAYS ONLY. Then resume your normal dose of Torsemide   Lab Work:  Go over to the [facility]ICAL [facility]ALL. Go pass the gift shop and have your blood work completed.   We will only call you if the results are abnormal or if the provider would like to make medication changes.  No news is good news.   Follow-Up in: Please follow up with the [facility] in 2 weeks with Ellouise Class, FNP.  At the [facility], you and your health needs are our priority. We have a designated team specialized in the treatment of Heart Failure. This Care Team includes your primary Heart Failure Specialized Cardiologist (physician), Advanced Practice Providers (APPs- Physician Assistants and Nurse Practitioners), and Pharmacist who all work together to provide you with the care you need, when you need it.   You may see any of the following providers on your designated Care Team at your next follow up:  Dr. Toribio Fuel Dr. Ezra Shuck Dr. Odis Brownie Greig Mosses, NP Caffie Shed, [place] [facility] [place], [place] Beckey Coe, NP Jordan Lee, NP Tinnie Redman, PharmD   Please be sure to bring in all your medications bottles to every appointment.   Need to Contact Us :  If you have any questions or concerns before your next appointment please send us  a message through [place] or call our office at [phone].    TO LEAVE A MESSAGE FOR THE NURSE SELECT OPTION 2, PLEASE LEAVE A MESSAGE INCLUDING: YOUR NAME DATE OF BIRTH CALL BACK NUMBER REASON FOR CALL**this is important as we prioritize the call backs  YOU WILL RECEIVE A CALL BACK THE SAME DAY AS LONG AS YOU CALL BEFORE 4:00 PM

## 2024-06-23 NOTE — Progress Notes (Signed)
 "  ADVANCED HEART FAILURE FOLLOW UP CLINIC NOTE  PCP: [facility] Primary Cardiologist: Redell Cave, MD HF Cardiologist: Zenaida Katz, MD  HPI: Erik Black is a 73 y.o. male with a PMH of morbid obesity, HTN, OSA, DM, HFrEF who presents for follow up of chronic systolic heart failure.     Diagnosed with HF in 9/23. Echo 9/23 EF 30-35%. Started lasix . Referred to [facility]   Admitted 11/23 with ADHF cath as below. Treated with IV lasix  and GDMT titrated. Diuresed 22 pounds.    Underwent ICD placement 01/28/23, admitted in 04/2023 with mechanical fall.   Now s/p R shoulder arthroplasty     SUBJECTIVE:  He returns today for HF follow up with wife. Overall feeling pretty good. No SOB, CP, edema. Does have significant fatigue. Does feel pins and needles occasional above ICD site. Able to perform ADLs. Appetite okay. Weight at home is up, not weighing every day. Compliant with all medications.  PMH, current medications, allergies, social history, and family history reviewed in epic.  Vitals:   06/23/24 1050  BP: 131/77  Pulse: 83  SpO2: 99%   Filed Weights   06/23/24 1050  Weight: 276 lb 6.4 oz (125.4 kg)    PHYSICAL EXAM: General: Well appearing. No distress  Cardiac: JVP difficult to assess. No murmurs  Extremities: Warm and dry.  No edema.  Neuro: A&O x3. Affect pleasant.   MDT Device Interrogation (personally reviewed): Optivol significantly above threshold, AF <0.1 h/d (1 episode), No VT/VF, 0% VP, 1.7 h/d activity   DATA REVIEW  ECG: 08/30/23: NSR, incomplete LBBB    ECHO: 4/25: EF 25-30%, RV ok 7/24; EF 25-30% RV ok  3/24: EF 20-25% RV ok   CMR:  11/23 EF 25% mid wall LGE strip c/w NICM. RV moderately down.   Zio:  11/23: Occasional NSVT 13.7% PVCs (one predominant morphology 13.6%)   ASSESSMENT & PLAN:  1. Chronic HFrEF - Severely reduced EF 25-30% since 3/24. S/p MDT ICD 7/24 - Suspect PVC CM vs familial (LMNA)  No evidence of  infiltrative process on CMR  - EF has not improved with PVC suppression with mexilitene. He has met with Dr. Fairy in [place] who agrees that Fhx is concerning for potential genetic CM. He has no first-degree relatives that would benefit from screening so has decided not to pursue. - Barostim denied by insurance - NYHA II-IIIa symptoms, mainly limited by fatigue. Volume significantly up on device, question accuracy as significant changes happened starting at time of last procedure. He does reports that weight is up at home. BNP/BMET today.  - increase torsemide  to 40 mg bid x 3 days, then back to daily take weight daily (has scale at home) - continue entresto  97/103 mg twice daily, carvedilol  25 mg twice daily, spironolactone  25 mg daily - continue vericiguat  10 mg daily - Intolerant of jardiance, yeast infection. - will ask device RN possibly recalibrate device.   2. Frequent PVCs - Zio 11/23: Occasional NSVT 13.7% PVCs (one predominant morphology 13.6%) - Continue mexiletine 200 mg twice daily for suppression - Zio 6/24 Sinus. 2.1% PVCs (suppressed on mexilitene)   3. HTN - Blood pressure well-controlled, continue current regimen   4. OSA - compliant with CPAP   5. Morbid obesity  - continue GLP-1 RA - Body mass index is 40.82 kg/m.  6. PAF - New onset. 9 episodes noted on device interrogation - CHA2DS2-VASc Score is 7.2 % stroke rate/year from a score of 5 - start  eliquis  5 mg bid - diuresis as above - message sent to EP for follow up appointment  7. CKD 3b - baseline Cr 1.3-1.5 - BMET today and at follow up  8. Fatigue - check iron panel, will refer for IV Fe if low, discussed today with patient.   Follow-up in 2 weeks  Ava Deguire, NP 06/23/2024 "

## 2024-07-05 ENCOUNTER — Other Ambulatory Visit: Payer: Self-pay | Admitting: Internal Medicine

## 2024-07-13 ENCOUNTER — Telehealth: Payer: Self-pay | Admitting: Family

## 2024-07-13 NOTE — Telephone Encounter (Signed)
 Called to confirm/remind patient of their appointment at the [facility] on 07/14/24.   Appointment:   [] Confirmed  [x] Left mess   [] No answer/No voice mail  [] VM Full/unable to leave message  [] Phone not in service  Patient reminded to bring all medications and/or complete list.  Confirmed patient has transportation. Gave directions, instructed to utilize valet parking.

## 2024-07-13 NOTE — Progress Notes (Unsigned)
 "  ADVANCED HEART FAILURE FOLLOW UP CLINIC NOTE  PCP: [facility] Primary Cardiologist: Redell Cave, MD HF Cardiologist: Zenaida Katz, MD  HPI: Daxton Nydam is a 74 y.o. male with a PMH of morbid obesity, HTN, OSA, DM, HFrEF who presents for follow up of chronic systolic heart failure.     Diagnosed with HF in 9/23. Echo 9/23 EF 30-35%. Started lasix . Referred to [facility]   Admitted 11/23 with ADHF cath as below. Treated with IV lasix  and GDMT titrated. Diuresed 22 pounds.    Underwent ICD placement 01/28/23, admitted in 04/2023 with mechanical fall.   Now s/p R shoulder arthroplasty     SUBJECTIVE:  He returns today for HF follow up with wife. Overall feeling pretty good. No SOB, CP, edema. Does have significant fatigue. Does feel pins and needles occasional above ICD site. Able to perform ADLs. Appetite okay. Weight at home is up, not weighing every day. Compliant with all medications.  PMH, current medications, allergies, social history, and family history reviewed in epic.  There were no vitals filed for this visit.  There were no vitals filed for this visit.   PHYSICAL EXAM: General: Well appearing. No distress  Cardiac: JVP difficult to assess. No murmurs  Extremities: Warm and dry.  No edema.  Neuro: A&O x3. Affect pleasant.   MDT Device Interrogation (personally reviewed): Optivol significantly above threshold, AF <0.1 h/d (1 episode), No VT/VF, 0% VP, 1.7 h/d activity   DATA REVIEW  ECG: 08/30/23: NSR, incomplete LBBB    ECHO: 4/25: EF 25-30%, RV ok 7/24; EF 25-30% RV ok  3/24: EF 20-25% RV ok   CMR:  11/23 EF 25% mid wall LGE strip c/w NICM. RV moderately down.   Zio:  11/23: Occasional NSVT 13.7% PVCs (one predominant morphology 13.6%)   ASSESSMENT & PLAN:  1. Chronic HFrEF - Severely reduced EF 25-30% since 3/24. S/p MDT ICD 7/24 - Suspect PVC CM vs familial (LMNA)  No evidence of infiltrative process on CMR  - EF has not  improved with PVC suppression with mexilitene. He has met with Dr. Fairy in [place] who agrees that Fhx is concerning for potential genetic CM. He has no first-degree relatives that would benefit from screening so has decided not to pursue. - Barostim denied by insurance - NYHA II-IIIa symptoms, mainly limited by fatigue. Volume significantly up on device, question accuracy as significant changes happened starting at time of last procedure. He does reports that weight is up at home. BNP/BMET today.  - increase torsemide  to 40 mg bid x 3 days, then back to daily take weight daily (has scale at home) - continue entresto  97/103 mg twice daily, carvedilol  25 mg twice daily, spironolactone  25 mg daily - continue vericiguat  10 mg daily - Intolerant of jardiance, yeast infection. - will ask device RN possibly recalibrate device.   2. Frequent PVCs - Zio 11/23: Occasional NSVT 13.7% PVCs (one predominant morphology 13.6%) - Continue mexiletine 200 mg twice daily for suppression - Zio 6/24 Sinus. 2.1% PVCs (suppressed on mexilitene)   3. HTN - Blood pressure well-controlled, continue current regimen   4. OSA - compliant with CPAP   5. Morbid obesity  - continue GLP-1 RA - There is no height or weight on file to calculate BMI.  6. PAF - New onset. 9 episodes noted on device interrogation - CHA2DS2-VASc Score is 7.2 % stroke rate/year from a score of 5 - start eliquis  5 mg bid - diuresis as above -  message sent to EP for follow up appointment  7. CKD 3b - baseline Cr 1.3-1.5 - BMET today and at follow up  8. Fatigue - check iron panel, will refer for IV Fe if low, discussed today with patient.   Follow-up in 2 weeks  Ellouise DELENA Class, [place] 07/13/2024 "

## 2024-07-14 ENCOUNTER — Telehealth: Payer: Self-pay | Admitting: Family

## 2024-07-14 ENCOUNTER — Ambulatory Visit: Admitting: Family

## 2024-07-14 NOTE — Telephone Encounter (Signed)
 Patient did not show for his Heart Failure Clinic appointment on 07/14/24.

## 2024-07-28 ENCOUNTER — Ambulatory Visit: Payer: Medicare HMO

## 2024-07-28 DIAGNOSIS — I428 Other cardiomyopathies: Secondary | ICD-10-CM

## 2024-07-28 LAB — CUP PACEART REMOTE DEVICE CHECK
Battery Remaining Longevity: 153 mo
Battery Voltage: 3.01 V
Brady Statistic RA Percent Paced: INVALID
Brady Statistic RV Percent Paced: 0 %
Date Time Interrogation Session: 20260127004254
HighPow Impedance: 55 Ohm
Implantable Lead Connection Status: 753985
Implantable Lead Implant Date: 20240729
Implantable Lead Location: 753860
Implantable Pulse Generator Implant Date: 20240729
Lead Channel Impedance Value: 513 Ohm
Lead Channel Impedance Value: 570 Ohm
Lead Channel Pacing Threshold Amplitude: 0.5 V
Lead Channel Pacing Threshold Pulse Width: 0.4 ms
Lead Channel Sensing Intrinsic Amplitude: 5.5 mV
Lead Channel Setting Pacing Amplitude: 1.5 V
Lead Channel Setting Pacing Pulse Width: 0.4 ms
Lead Channel Setting Sensing Sensitivity: 0.3 mV
Zone Setting Status: 755011

## 2024-07-29 ENCOUNTER — Telehealth: Payer: Self-pay | Admitting: Family

## 2024-07-29 NOTE — Progress Notes (Unsigned)
" ° °  ADVANCED HEART FAILURE FOLLOW UP CLINIC NOTE  PCP: [facility] Primary Cardiologist: Redell Cave, MD HF Cardiologist: Zenaida Katz, MD  Chief Complaint:   HPI: Erik Black is a 74 y.o. male with a PMH of morbid obesity, HTN, OSA, DM, HFrEF who presents for follow up of chronic systolic heart failure.  Diagnosed with HF in 9/23. Echo 9/23 EF 30-35%. Started lasix . Referred to [facility]   Admitted 11/23 with ADHF cath as below. Treated with IV lasix  and GDMT titrated. Diuresed 22 pounds.    Underwent ICD placement 01/28/23, admitted in 04/2023 with mechanical fall.   Now s/p R shoulder arthroplasty  Seen in [facility] 06/23/24 where he was diagnosed with PAF and started on eliquis  5mg  BID.    SUBJECTIVE:  He returns today for HF follow up with wife with a chief complaint of    ROS: All systems negative except what is listed in HPI, PMH and Problem List   PMH, current medications, allergies, social history, and family history reviewed in epic.   PHYSICAL EXAM: General: Well appearing. No distress  Cardiac: JVP difficult to assess. No murmurs  Extremities: Warm and dry.  No edema.  Neuro: A&O x3. Affect pleasant.   MDT Device Interrogation (personally reviewed): Optivol significantly above threshold, AF <0.1 h/d (1 episode), No VT/VF, 0% VP, 1.7 h/d activity      DATA REVIEW  ECG: 08/30/23: NSR, incomplete LBBB    ECHO: 4/25: EF 25-30%, RV ok 7/24; EF 25-30% RV ok  3/24: EF 20-25% RV ok   CMR:  11/23 EF 25% mid wall LGE strip c/w NICM. RV moderately down.   Zio:  11/23: Occasional NSVT 13.7% PVCs (one predominant morphology 13.6%)   ASSESSMENT & PLAN:  1. Chronic HFrEF - Severely reduced EF 25-30% since 3/24. S/p MDT ICD 7/24 - Suspect PVC CM vs familial (LMNA)  No evidence of infiltrative process on CMR  - EF has not improved with PVC suppression with mexilitene. He has met with Dr. Fairy in [place] who agrees that Fhx is concerning for  potential genetic CM. He has no first-degree relatives that would benefit from screening so has decided not to pursue. - Barostim denied by insurance - NYHA II-IIIa symptoms, mainly limited by fatigue. Volume significantly up on device - weight 276.4 from last visit here 5 weeks ago - continue torsemide  40 mg daily - continue entresto  97/103 mg twice daily, carvedilol  25 mg twice daily, spironolactone  25 mg daily - continue vericiguat  10 mg daily - Intolerant of jardiance, yeast infection. - proBNP 06/06/23 was 129   2. Frequent PVCs - Zio 11/23: Occasional NSVT 13.7% PVCs (one predominant morphology 13.6%) - Continue mexiletine 200 mg twice daily for suppression - Zio 6/24 Sinus. 2.1% PVCs (suppressed on mexilitene)   3. HTN - BP  - BMET 06/23/24 reviewed: K 4, creatinine 1.42, GFR 53   4. OSA - compliant with CPAP   5. Morbid obesity  - continue GLP-1 RA - There is no height or weight on file to calculate BMI.  6. PAF - New onset. 9 episodes noted on device interrogation - CHA2DS2-VASc Score is 7.2 % stroke rate/year from a score of 5 - continue eliquis  5 mg bid  - message sent to EP for follow up appointment  7. CKD 3b - baseline Cr 1.3-1.5 - BMET   8. Fatigue - Iron levels ok 12/25     Ellouise DELENA Class, [place] 07/29/24 "

## 2024-07-29 NOTE — Telephone Encounter (Signed)
 Called to confirm/remind patient of their appointment at the [facility] on 07/30/24.   Appointment:   [x] Confirmed  [] Left mess   [] No answer/No voice mail  [] VM Full/unable to leave message  [] Phone not in service  Patient reminded to bring all medications and/or complete list.  Confirmed patient has transportation. Gave directions, instructed to utilize valet parking.

## 2024-07-30 ENCOUNTER — Ambulatory Visit: Attending: Family | Admitting: Family

## 2024-07-30 ENCOUNTER — Encounter: Payer: Self-pay | Admitting: Family

## 2024-07-30 VITALS — BP 121/71 | HR 78 | Wt 280.1 lb

## 2024-07-30 DIAGNOSIS — I493 Ventricular premature depolarization: Secondary | ICD-10-CM | POA: Diagnosis not present

## 2024-07-30 DIAGNOSIS — I130 Hypertensive heart and chronic kidney disease with heart failure and stage 1 through stage 4 chronic kidney disease, or unspecified chronic kidney disease: Secondary | ICD-10-CM | POA: Insufficient documentation

## 2024-07-30 DIAGNOSIS — Z95810 Presence of automatic (implantable) cardiac defibrillator: Secondary | ICD-10-CM | POA: Insufficient documentation

## 2024-07-30 DIAGNOSIS — I10 Essential (primary) hypertension: Secondary | ICD-10-CM

## 2024-07-30 DIAGNOSIS — E1122 Type 2 diabetes mellitus with diabetic chronic kidney disease: Secondary | ICD-10-CM | POA: Diagnosis not present

## 2024-07-30 DIAGNOSIS — R5383 Other fatigue: Secondary | ICD-10-CM

## 2024-07-30 DIAGNOSIS — I5022 Chronic systolic (congestive) heart failure: Secondary | ICD-10-CM | POA: Diagnosis not present

## 2024-07-30 DIAGNOSIS — Z79899 Other long term (current) drug therapy: Secondary | ICD-10-CM | POA: Insufficient documentation

## 2024-07-30 DIAGNOSIS — Z6841 Body Mass Index (BMI) 40.0 and over, adult: Secondary | ICD-10-CM | POA: Diagnosis not present

## 2024-07-30 DIAGNOSIS — Z7901 Long term (current) use of anticoagulants: Secondary | ICD-10-CM | POA: Insufficient documentation

## 2024-07-30 DIAGNOSIS — Z96611 Presence of right artificial shoulder joint: Secondary | ICD-10-CM | POA: Insufficient documentation

## 2024-07-30 DIAGNOSIS — N1832 Chronic kidney disease, stage 3b: Secondary | ICD-10-CM | POA: Insufficient documentation

## 2024-07-30 DIAGNOSIS — G4733 Obstructive sleep apnea (adult) (pediatric): Secondary | ICD-10-CM | POA: Insufficient documentation

## 2024-07-30 DIAGNOSIS — I480 Paroxysmal atrial fibrillation: Secondary | ICD-10-CM | POA: Insufficient documentation

## 2024-07-30 MED ORDER — TORSEMIDE 20 MG PO TABS: 60.0000 mg | ORAL_TABLET | Freq: Every day | ORAL | 1 refills | Status: AC

## 2024-07-30 NOTE — Patient Instructions (Signed)
 Medication Changes:  INCREASE Torsemide  to 60mg  (3 tabs) daily  Lab Work: Go downstairs to [facility] on [address] to have your blood work completed.  We will only call you if the results are abnormal or if the provider would like to make medication changes.  No news is good news.   Follow-Up in: Please follow up with the [facility] in 2 weeks with Ellouise Class, FNP.   Thank you for choosing [facility].    At the [facility], you and your health needs are our priority. We have a designated team specialized in the treatment of Heart Failure. This Care Team includes your primary Heart Failure Specialized Cardiologist (physician), Advanced Practice Providers (APPs- Physician Assistants and Nurse Practitioners), and Pharmacist who all work together to provide you with the care you need, when you need it.   You may see any of the following providers on your designated Care Team at your next follow up:  Dr. Toribio Fuel Dr. Ezra Shuck Dr. Ria Commander Dr. Morene Brownie Ellouise Class, FNP Jaun Bash, RPH-CPP  Please be sure to bring in all your medications bottles to every appointment.   Need to Contact Us :  If you have any questions or concerns before your next appointment please send us  a message through [place] or call our office at [phone].    TO LEAVE A MESSAGE FOR THE NURSE SELECT OPTION 2, PLEASE LEAVE A MESSAGE INCLUDING: YOUR NAME DATE OF BIRTH CALL BACK NUMBER REASON FOR CALL**this is important as we prioritize the call backs  YOU WILL RECEIVE A CALL BACK THE SAME DAY AS LONG AS YOU CALL BEFORE 4:00 PM

## 2024-07-31 ENCOUNTER — Ambulatory Visit: Payer: Self-pay | Admitting: Family

## 2024-07-31 LAB — BASIC METABOLIC PANEL WITH GFR
BUN/Creatinine Ratio: 8 — ABNORMAL LOW (ref 10–24)
BUN: 12 mg/dL (ref 8–27)
CO2: 26 mmol/L (ref 20–29)
Calcium: 9.7 mg/dL (ref 8.6–10.2)
Chloride: 102 mmol/L (ref 96–106)
Creatinine, Ser: 1.52 mg/dL — ABNORMAL HIGH (ref 0.76–1.27)
Glucose: 120 mg/dL — ABNORMAL HIGH (ref 70–99)
Potassium: 4.4 mmol/L (ref 3.5–5.2)
Sodium: 140 mmol/L (ref 134–144)
eGFR: 48 mL/min/{1.73_m2} — ABNORMAL LOW

## 2024-07-31 LAB — PRO B NATRIURETIC PEPTIDE: NT-Pro BNP: 73 pg/mL (ref 0–376)

## 2024-08-02 ENCOUNTER — Ambulatory Visit: Payer: Self-pay | Admitting: Cardiology

## 2024-08-05 ENCOUNTER — Ambulatory Visit

## 2024-08-05 DIAGNOSIS — I5022 Chronic systolic (congestive) heart failure: Secondary | ICD-10-CM

## 2024-08-05 LAB — CUP PACEART INCLINIC DEVICE CHECK
Date Time Interrogation Session: 20260204110647
Implantable Lead Connection Status: 753985
Implantable Lead Implant Date: 20240729
Implantable Lead Location: 753860
Implantable Pulse Generator Implant Date: 20240729

## 2024-08-05 NOTE — Patient Instructions (Signed)
 Follow up as scheduled.

## 2024-08-05 NOTE — Progress Notes (Signed)
 Pt seen in device clinic for Optivol recalibration.

## 2024-08-06 NOTE — Progress Notes (Signed)
 Remote ICD Transmission

## 2024-08-14 ENCOUNTER — Ambulatory Visit: Admitting: Family
